# Patient Record
Sex: Female | Born: 1955 | Race: White | Hispanic: No | Smoking: Never smoker
Health system: Southern US, Community
[De-identification: ages and names within clinical notes are randomized; demographics above are authoritative.]

## PROBLEM LIST (undated history)

## (undated) DIAGNOSIS — E119 Type 2 diabetes mellitus without complications: Secondary | ICD-10-CM

## (undated) DIAGNOSIS — C439 Malignant melanoma of skin, unspecified: Secondary | ICD-10-CM

## (undated) DIAGNOSIS — Z803 Family history of malignant neoplasm of breast: Secondary | ICD-10-CM

## (undated) DIAGNOSIS — D6862 Lupus anticoagulant syndrome: Secondary | ICD-10-CM

## (undated) DIAGNOSIS — Z8043 Family history of malignant neoplasm of testis: Secondary | ICD-10-CM

## (undated) DIAGNOSIS — T7840XA Allergy, unspecified, initial encounter: Secondary | ICD-10-CM

## (undated) DIAGNOSIS — I2699 Other pulmonary embolism without acute cor pulmonale: Secondary | ICD-10-CM

## (undated) DIAGNOSIS — J45909 Unspecified asthma, uncomplicated: Secondary | ICD-10-CM

## (undated) DIAGNOSIS — C801 Malignant (primary) neoplasm, unspecified: Secondary | ICD-10-CM

## (undated) DIAGNOSIS — I82409 Acute embolism and thrombosis of unspecified deep veins of unspecified lower extremity: Secondary | ICD-10-CM

## (undated) DIAGNOSIS — E039 Hypothyroidism, unspecified: Secondary | ICD-10-CM

## (undated) DIAGNOSIS — K219 Gastro-esophageal reflux disease without esophagitis: Secondary | ICD-10-CM

## (undated) DIAGNOSIS — D689 Coagulation defect, unspecified: Secondary | ICD-10-CM

## (undated) DIAGNOSIS — Z8041 Family history of malignant neoplasm of ovary: Secondary | ICD-10-CM

## (undated) DIAGNOSIS — I10 Essential (primary) hypertension: Secondary | ICD-10-CM

## (undated) DIAGNOSIS — Z5189 Encounter for other specified aftercare: Secondary | ICD-10-CM

## (undated) DIAGNOSIS — E079 Disorder of thyroid, unspecified: Secondary | ICD-10-CM

## (undated) DIAGNOSIS — I499 Cardiac arrhythmia, unspecified: Secondary | ICD-10-CM

## (undated) DIAGNOSIS — F419 Anxiety disorder, unspecified: Secondary | ICD-10-CM

## (undated) DIAGNOSIS — C7A8 Other malignant neuroendocrine tumors: Secondary | ICD-10-CM

## (undated) DIAGNOSIS — Z801 Family history of malignant neoplasm of trachea, bronchus and lung: Secondary | ICD-10-CM

## (undated) DIAGNOSIS — D509 Iron deficiency anemia, unspecified: Secondary | ICD-10-CM

## (undated) DIAGNOSIS — M1990 Unspecified osteoarthritis, unspecified site: Secondary | ICD-10-CM

## (undated) DIAGNOSIS — R519 Headache, unspecified: Secondary | ICD-10-CM

## (undated) DIAGNOSIS — F32A Depression, unspecified: Secondary | ICD-10-CM

## (undated) DIAGNOSIS — I480 Paroxysmal atrial fibrillation: Secondary | ICD-10-CM

## (undated) DIAGNOSIS — Z808 Family history of malignant neoplasm of other organs or systems: Secondary | ICD-10-CM

## (undated) DIAGNOSIS — H269 Unspecified cataract: Secondary | ICD-10-CM

## (undated) DIAGNOSIS — J189 Pneumonia, unspecified organism: Secondary | ICD-10-CM

## (undated) DIAGNOSIS — Z8049 Family history of malignant neoplasm of other genital organs: Secondary | ICD-10-CM

## (undated) DIAGNOSIS — M722 Plantar fascial fibromatosis: Secondary | ICD-10-CM

## (undated) DIAGNOSIS — F329 Major depressive disorder, single episode, unspecified: Secondary | ICD-10-CM

## (undated) DIAGNOSIS — N189 Chronic kidney disease, unspecified: Secondary | ICD-10-CM

## (undated) DIAGNOSIS — D6859 Other primary thrombophilia: Secondary | ICD-10-CM

## (undated) HISTORY — PX: KNEE ARTHROSCOPY: SHX127

## (undated) HISTORY — DX: Malignant (primary) neoplasm, unspecified: C80.1

## (undated) HISTORY — DX: Coagulation defect, unspecified: D68.9

## (undated) HISTORY — DX: Major depressive disorder, single episode, unspecified: F32.9

## (undated) HISTORY — DX: Malignant melanoma of skin, unspecified: C43.9

## (undated) HISTORY — DX: Lupus anticoagulant syndrome: D68.62

## (undated) HISTORY — PX: ABDOMINAL HYSTERECTOMY: SHX81

## (undated) HISTORY — DX: Family history of malignant neoplasm of ovary: Z80.41

## (undated) HISTORY — DX: Family history of malignant neoplasm of other genital organs: Z80.49

## (undated) HISTORY — DX: Disorder of thyroid, unspecified: E07.9

## (undated) HISTORY — PX: OTHER SURGICAL HISTORY: SHX169

## (undated) HISTORY — DX: Family history of malignant neoplasm of testis: Z80.43

## (undated) HISTORY — DX: Other pulmonary embolism without acute cor pulmonale: I26.99

## (undated) HISTORY — PX: CHOLECYSTECTOMY: SHX55

## (undated) HISTORY — DX: Unspecified asthma, uncomplicated: J45.909

## (undated) HISTORY — PX: COLONOSCOPY: SHX174

## (undated) HISTORY — DX: Other primary thrombophilia: D68.59

## (undated) HISTORY — DX: Cardiac arrhythmia, unspecified: I49.9

## (undated) HISTORY — DX: Essential (primary) hypertension: I10

## (undated) HISTORY — DX: Type 2 diabetes mellitus without complications: E11.9

## (undated) HISTORY — DX: Encounter for other specified aftercare: Z51.89

## (undated) HISTORY — DX: Unspecified cataract: H26.9

## (undated) HISTORY — DX: Family history of malignant neoplasm of breast: Z80.3

## (undated) HISTORY — DX: Depression, unspecified: F32.A

## (undated) HISTORY — DX: Family history of malignant neoplasm of other organs or systems: Z80.8

## (undated) HISTORY — DX: Gastro-esophageal reflux disease without esophagitis: K21.9

## (undated) HISTORY — DX: Allergy, unspecified, initial encounter: T78.40XA

## (undated) HISTORY — PX: UPPER GASTROINTESTINAL ENDOSCOPY: SHX188

## (undated) HISTORY — DX: Family history of malignant neoplasm of trachea, bronchus and lung: Z80.1

## (undated) HISTORY — DX: Paroxysmal atrial fibrillation: I48.0

## (undated) HISTORY — DX: Unspecified osteoarthritis, unspecified site: M19.90

## (undated) HISTORY — PX: HYSTEROTOMY: SHX1776

## (undated) HISTORY — PX: OOPHORECTOMY: SHX86

## (undated) HISTORY — DX: Anxiety disorder, unspecified: F41.9

---
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
## 1981-09-21 HISTORY — PX: BACK SURGERY: SHX140

## 1998-01-06 ENCOUNTER — Other Ambulatory Visit: Admission: RE | Admit: 1998-01-06 | Discharge: 1998-01-06 | Payer: Self-pay | Admitting: Internal Medicine

## 1998-03-12 ENCOUNTER — Ambulatory Visit (HOSPITAL_COMMUNITY): Admission: RE | Admit: 1998-03-12 | Discharge: 1998-03-12 | Payer: Self-pay | Admitting: Internal Medicine

## 1998-07-25 ENCOUNTER — Other Ambulatory Visit: Admission: RE | Admit: 1998-07-25 | Discharge: 1998-07-25 | Payer: Self-pay | Admitting: Obstetrics and Gynecology

## 1998-09-21 ENCOUNTER — Emergency Department (HOSPITAL_COMMUNITY): Admission: EM | Admit: 1998-09-21 | Discharge: 1998-09-21 | Payer: Self-pay | Admitting: Emergency Medicine

## 1998-11-07 ENCOUNTER — Encounter: Admission: RE | Admit: 1998-11-07 | Discharge: 1999-02-05 | Payer: Self-pay | Admitting: Anesthesiology

## 1999-06-02 ENCOUNTER — Ambulatory Visit (HOSPITAL_COMMUNITY): Admission: RE | Admit: 1999-06-02 | Discharge: 1999-06-02 | Payer: Self-pay | Admitting: Internal Medicine

## 1999-09-19 ENCOUNTER — Ambulatory Visit (HOSPITAL_COMMUNITY): Admission: RE | Admit: 1999-09-19 | Discharge: 1999-09-19 | Payer: Self-pay | Admitting: Otolaryngology

## 1999-09-19 ENCOUNTER — Encounter: Payer: Self-pay | Admitting: Otolaryngology

## 1999-11-12 ENCOUNTER — Encounter: Payer: Self-pay | Admitting: Internal Medicine

## 1999-11-12 ENCOUNTER — Encounter: Admission: RE | Admit: 1999-11-12 | Discharge: 1999-11-12 | Payer: Self-pay | Admitting: Internal Medicine

## 2000-03-27 ENCOUNTER — Encounter: Payer: Self-pay | Admitting: Emergency Medicine

## 2000-03-27 ENCOUNTER — Emergency Department (HOSPITAL_COMMUNITY): Admission: EM | Admit: 2000-03-27 | Discharge: 2000-03-27 | Payer: Self-pay | Admitting: Emergency Medicine

## 2000-08-29 ENCOUNTER — Emergency Department (HOSPITAL_COMMUNITY): Admission: EM | Admit: 2000-08-29 | Discharge: 2000-08-29 | Payer: Self-pay | Admitting: Emergency Medicine

## 2000-08-29 ENCOUNTER — Encounter: Payer: Self-pay | Admitting: Emergency Medicine

## 2000-10-27 ENCOUNTER — Other Ambulatory Visit: Admission: RE | Admit: 2000-10-27 | Discharge: 2000-10-27 | Payer: Self-pay | Admitting: Obstetrics and Gynecology

## 2001-07-28 ENCOUNTER — Inpatient Hospital Stay (HOSPITAL_COMMUNITY): Admission: EM | Admit: 2001-07-28 | Discharge: 2001-07-31 | Payer: Self-pay

## 2001-07-29 ENCOUNTER — Encounter: Payer: Self-pay | Admitting: Internal Medicine

## 2001-12-25 ENCOUNTER — Emergency Department (HOSPITAL_COMMUNITY): Admission: EM | Admit: 2001-12-25 | Discharge: 2001-12-25 | Payer: Self-pay | Admitting: Emergency Medicine

## 2003-09-22 HISTORY — PX: MELANOMA EXCISION: SHX5266

## 2004-01-14 ENCOUNTER — Other Ambulatory Visit: Admission: RE | Admit: 2004-01-14 | Discharge: 2004-01-14 | Payer: Self-pay | Admitting: Obstetrics and Gynecology

## 2004-01-14 LAB — HM PAP SMEAR

## 2004-07-28 ENCOUNTER — Ambulatory Visit: Payer: Self-pay | Admitting: Internal Medicine

## 2004-08-04 ENCOUNTER — Ambulatory Visit: Payer: Self-pay | Admitting: Licensed Clinical Social Worker

## 2004-08-05 ENCOUNTER — Ambulatory Visit: Payer: Self-pay | Admitting: Gastroenterology

## 2004-08-15 ENCOUNTER — Ambulatory Visit: Payer: Self-pay | Admitting: Internal Medicine

## 2004-08-21 HISTORY — PX: OTHER SURGICAL HISTORY: SHX169

## 2004-08-26 ENCOUNTER — Ambulatory Visit: Payer: Self-pay | Admitting: Internal Medicine

## 2004-09-08 ENCOUNTER — Ambulatory Visit: Payer: Self-pay | Admitting: Internal Medicine

## 2004-09-18 ENCOUNTER — Ambulatory Visit: Payer: Self-pay | Admitting: Internal Medicine

## 2004-09-25 ENCOUNTER — Ambulatory Visit: Payer: Self-pay | Admitting: Internal Medicine

## 2004-10-06 ENCOUNTER — Ambulatory Visit: Payer: Self-pay | Admitting: Internal Medicine

## 2004-10-09 ENCOUNTER — Ambulatory Visit: Payer: Self-pay | Admitting: Gastroenterology

## 2004-10-16 ENCOUNTER — Ambulatory Visit: Payer: Self-pay | Admitting: Internal Medicine

## 2004-10-17 ENCOUNTER — Encounter: Payer: Self-pay | Admitting: Internal Medicine

## 2004-10-17 ENCOUNTER — Ambulatory Visit: Payer: Self-pay | Admitting: Gastroenterology

## 2004-10-27 ENCOUNTER — Ambulatory Visit: Payer: Self-pay | Admitting: Internal Medicine

## 2004-11-07 ENCOUNTER — Ambulatory Visit: Payer: Self-pay | Admitting: Internal Medicine

## 2004-12-05 ENCOUNTER — Ambulatory Visit: Payer: Self-pay | Admitting: Internal Medicine

## 2004-12-19 ENCOUNTER — Ambulatory Visit: Payer: Self-pay | Admitting: Internal Medicine

## 2005-01-07 ENCOUNTER — Ambulatory Visit: Payer: Self-pay | Admitting: Internal Medicine

## 2005-02-04 ENCOUNTER — Ambulatory Visit: Payer: Self-pay | Admitting: Internal Medicine

## 2005-03-06 ENCOUNTER — Ambulatory Visit: Payer: Self-pay | Admitting: Internal Medicine

## 2005-03-18 ENCOUNTER — Ambulatory Visit: Payer: Self-pay | Admitting: Internal Medicine

## 2005-04-03 ENCOUNTER — Ambulatory Visit: Payer: Self-pay | Admitting: Internal Medicine

## 2005-04-07 ENCOUNTER — Ambulatory Visit: Payer: Self-pay | Admitting: Internal Medicine

## 2005-05-04 ENCOUNTER — Ambulatory Visit: Payer: Self-pay | Admitting: Internal Medicine

## 2005-05-22 ENCOUNTER — Ambulatory Visit: Payer: Self-pay | Admitting: Gastroenterology

## 2005-06-01 ENCOUNTER — Encounter (INDEPENDENT_AMBULATORY_CARE_PROVIDER_SITE_OTHER): Payer: Self-pay | Admitting: *Deleted

## 2005-06-01 ENCOUNTER — Ambulatory Visit: Payer: Self-pay | Admitting: Gastroenterology

## 2005-06-04 ENCOUNTER — Ambulatory Visit: Payer: Self-pay | Admitting: Internal Medicine

## 2005-06-18 ENCOUNTER — Ambulatory Visit: Payer: Self-pay | Admitting: Gastroenterology

## 2005-07-03 ENCOUNTER — Ambulatory Visit: Payer: Self-pay | Admitting: Internal Medicine

## 2005-07-07 ENCOUNTER — Ambulatory Visit: Payer: Self-pay | Admitting: Gastroenterology

## 2005-07-08 ENCOUNTER — Ambulatory Visit: Payer: Self-pay | Admitting: Internal Medicine

## 2005-07-24 ENCOUNTER — Ambulatory Visit (HOSPITAL_COMMUNITY): Admission: RE | Admit: 2005-07-24 | Discharge: 2005-07-24 | Payer: Self-pay | Admitting: Gastroenterology

## 2005-07-24 ENCOUNTER — Ambulatory Visit: Payer: Self-pay | Admitting: Internal Medicine

## 2005-08-03 ENCOUNTER — Ambulatory Visit: Payer: Self-pay | Admitting: Internal Medicine

## 2005-09-04 ENCOUNTER — Ambulatory Visit: Payer: Self-pay | Admitting: Internal Medicine

## 2005-09-09 ENCOUNTER — Ambulatory Visit: Payer: Self-pay | Admitting: Internal Medicine

## 2005-10-02 ENCOUNTER — Ambulatory Visit: Payer: Self-pay | Admitting: Internal Medicine

## 2005-10-09 ENCOUNTER — Ambulatory Visit: Payer: Self-pay | Admitting: Internal Medicine

## 2005-10-30 ENCOUNTER — Ambulatory Visit: Payer: Self-pay | Admitting: Internal Medicine

## 2005-11-10 ENCOUNTER — Ambulatory Visit: Payer: Self-pay | Admitting: Internal Medicine

## 2005-11-26 ENCOUNTER — Ambulatory Visit: Payer: Self-pay | Admitting: Internal Medicine

## 2005-12-15 ENCOUNTER — Ambulatory Visit: Payer: Self-pay | Admitting: Internal Medicine

## 2005-12-24 ENCOUNTER — Ambulatory Visit: Payer: Self-pay | Admitting: Internal Medicine

## 2006-01-19 ENCOUNTER — Ambulatory Visit: Payer: Self-pay | Admitting: Internal Medicine

## 2006-03-01 ENCOUNTER — Ambulatory Visit: Payer: Self-pay | Admitting: Internal Medicine

## 2006-04-01 ENCOUNTER — Ambulatory Visit: Payer: Self-pay | Admitting: Internal Medicine

## 2006-04-21 ENCOUNTER — Ambulatory Visit: Payer: Self-pay | Admitting: Internal Medicine

## 2006-05-04 ENCOUNTER — Ambulatory Visit: Payer: Self-pay | Admitting: Internal Medicine

## 2006-06-03 ENCOUNTER — Ambulatory Visit: Payer: Self-pay | Admitting: Internal Medicine

## 2006-06-09 ENCOUNTER — Ambulatory Visit: Payer: Self-pay | Admitting: Internal Medicine

## 2006-06-24 ENCOUNTER — Ambulatory Visit: Payer: Self-pay | Admitting: Internal Medicine

## 2006-06-24 ENCOUNTER — Encounter: Admission: RE | Admit: 2006-06-24 | Discharge: 2006-06-24 | Payer: Self-pay | Admitting: Internal Medicine

## 2006-07-22 ENCOUNTER — Ambulatory Visit: Payer: Self-pay | Admitting: Internal Medicine

## 2006-07-22 LAB — CONVERTED CEMR LAB
ALT: 34 units/L (ref 0–40)
AST: 30 units/L (ref 0–37)
Albumin: 4 g/dL (ref 3.5–5.2)
Bilirubin, Direct: 0.2 mg/dL (ref 0.0–0.3)
LDL Cholesterol: 81 mg/dL (ref 0–99)
TSH: 2.92 microintl units/mL (ref 0.35–5.50)
VLDL: 15 mg/dL (ref 0–40)

## 2006-07-29 ENCOUNTER — Ambulatory Visit: Payer: Self-pay | Admitting: Internal Medicine

## 2006-08-19 ENCOUNTER — Ambulatory Visit: Payer: Self-pay | Admitting: Internal Medicine

## 2006-09-17 ENCOUNTER — Ambulatory Visit: Payer: Self-pay | Admitting: Internal Medicine

## 2006-10-21 ENCOUNTER — Ambulatory Visit: Payer: Self-pay | Admitting: Internal Medicine

## 2006-10-27 ENCOUNTER — Ambulatory Visit: Payer: Self-pay | Admitting: Internal Medicine

## 2006-10-27 LAB — CONVERTED CEMR LAB
BUN: 10 mg/dL (ref 6–23)
Basophils Relative: 0.8 % (ref 0.0–1.0)
CO2: 34 meq/L — ABNORMAL HIGH (ref 19–32)
Creatinine, Ser: 0.8 mg/dL (ref 0.4–1.2)
GFR calc Af Amer: 97 mL/min
Glucose, Bld: 97 mg/dL (ref 70–99)
HCT: 41.5 % (ref 36.0–46.0)
Hemoglobin: 14.5 g/dL (ref 12.0–15.0)
Lymphocytes Relative: 35.4 % (ref 12.0–46.0)
Monocytes Absolute: 0.5 10*3/uL (ref 0.2–0.7)
Monocytes Relative: 8 % (ref 3.0–11.0)
Neutro Abs: 2.9 10*3/uL (ref 1.4–7.7)
Neutrophils Relative %: 49.3 % (ref 43.0–77.0)
Potassium: 3.9 meq/L (ref 3.5–5.1)
RDW: 11.9 % (ref 11.5–14.6)
Sodium: 141 meq/L (ref 135–145)

## 2006-11-04 ENCOUNTER — Ambulatory Visit: Payer: Self-pay | Admitting: Internal Medicine

## 2006-11-17 ENCOUNTER — Ambulatory Visit: Payer: Self-pay | Admitting: Internal Medicine

## 2006-11-25 ENCOUNTER — Ambulatory Visit: Payer: Self-pay | Admitting: Internal Medicine

## 2006-12-27 ENCOUNTER — Ambulatory Visit: Payer: Self-pay | Admitting: Internal Medicine

## 2007-01-27 ENCOUNTER — Ambulatory Visit: Payer: Self-pay | Admitting: Internal Medicine

## 2007-02-17 ENCOUNTER — Ambulatory Visit: Payer: Self-pay | Admitting: Internal Medicine

## 2007-02-22 ENCOUNTER — Ambulatory Visit: Payer: Self-pay | Admitting: Internal Medicine

## 2007-03-09 DIAGNOSIS — Z85828 Personal history of other malignant neoplasm of skin: Secondary | ICD-10-CM

## 2007-03-09 DIAGNOSIS — I10 Essential (primary) hypertension: Secondary | ICD-10-CM

## 2007-03-09 DIAGNOSIS — E039 Hypothyroidism, unspecified: Secondary | ICD-10-CM | POA: Insufficient documentation

## 2007-03-09 DIAGNOSIS — K219 Gastro-esophageal reflux disease without esophagitis: Secondary | ICD-10-CM | POA: Insufficient documentation

## 2007-03-09 DIAGNOSIS — J309 Allergic rhinitis, unspecified: Secondary | ICD-10-CM | POA: Insufficient documentation

## 2007-03-18 ENCOUNTER — Ambulatory Visit: Payer: Self-pay | Admitting: Internal Medicine

## 2007-04-18 ENCOUNTER — Ambulatory Visit: Payer: Self-pay | Admitting: Internal Medicine

## 2007-04-18 DIAGNOSIS — I82409 Acute embolism and thrombosis of unspecified deep veins of unspecified lower extremity: Secondary | ICD-10-CM | POA: Insufficient documentation

## 2007-05-19 ENCOUNTER — Ambulatory Visit: Payer: Self-pay | Admitting: Internal Medicine

## 2007-05-19 DIAGNOSIS — I824Y9 Acute embolism and thrombosis of unspecified deep veins of unspecified proximal lower extremity: Secondary | ICD-10-CM

## 2007-05-19 LAB — CONVERTED CEMR LAB
INR: 1.7
Prothrombin Time: 15.8 s

## 2007-05-20 ENCOUNTER — Encounter: Payer: Self-pay | Admitting: Internal Medicine

## 2007-05-25 ENCOUNTER — Ambulatory Visit: Payer: Self-pay | Admitting: Internal Medicine

## 2007-05-25 DIAGNOSIS — M810 Age-related osteoporosis without current pathological fracture: Secondary | ICD-10-CM | POA: Insufficient documentation

## 2007-05-25 LAB — CONVERTED CEMR LAB: TSH: 3.63 microintl units/mL (ref 0.35–5.50)

## 2007-06-13 ENCOUNTER — Encounter: Payer: Self-pay | Admitting: Internal Medicine

## 2007-06-16 ENCOUNTER — Ambulatory Visit: Payer: Self-pay | Admitting: Internal Medicine

## 2007-06-16 LAB — CONVERTED CEMR LAB
INR: 1.9
Prothrombin Time: 17 s

## 2007-07-14 ENCOUNTER — Ambulatory Visit: Payer: Self-pay | Admitting: Internal Medicine

## 2007-07-14 LAB — CONVERTED CEMR LAB: INR: 2

## 2007-07-26 ENCOUNTER — Ambulatory Visit: Payer: Self-pay | Admitting: Internal Medicine

## 2007-08-11 ENCOUNTER — Ambulatory Visit: Payer: Self-pay | Admitting: Internal Medicine

## 2007-08-11 LAB — CONVERTED CEMR LAB: Prothrombin Time: 20.7 s

## 2007-08-24 DIAGNOSIS — T1490XA Injury, unspecified, initial encounter: Secondary | ICD-10-CM | POA: Insufficient documentation

## 2007-08-26 ENCOUNTER — Ambulatory Visit: Payer: Self-pay | Admitting: Family Medicine

## 2007-08-30 ENCOUNTER — Telehealth: Payer: Self-pay | Admitting: Internal Medicine

## 2007-09-02 ENCOUNTER — Ambulatory Visit: Payer: Self-pay | Admitting: Internal Medicine

## 2007-09-19 ENCOUNTER — Ambulatory Visit: Payer: Self-pay | Admitting: Internal Medicine

## 2007-09-19 DIAGNOSIS — H66009 Acute suppurative otitis media without spontaneous rupture of ear drum, unspecified ear: Secondary | ICD-10-CM | POA: Insufficient documentation

## 2007-09-21 ENCOUNTER — Telehealth: Payer: Self-pay | Admitting: Family Medicine

## 2007-09-21 ENCOUNTER — Ambulatory Visit: Payer: Self-pay | Admitting: Family Medicine

## 2007-09-28 ENCOUNTER — Encounter: Payer: Self-pay | Admitting: Internal Medicine

## 2007-09-29 ENCOUNTER — Ambulatory Visit: Payer: Self-pay | Admitting: Internal Medicine

## 2007-10-06 ENCOUNTER — Telehealth: Payer: Self-pay | Admitting: Internal Medicine

## 2007-10-07 ENCOUNTER — Ambulatory Visit: Payer: Self-pay | Admitting: Internal Medicine

## 2007-11-01 ENCOUNTER — Ambulatory Visit: Payer: Self-pay | Admitting: Internal Medicine

## 2007-11-01 LAB — CONVERTED CEMR LAB
INR: 2.8
Prothrombin Time: 20.2 s

## 2007-11-08 ENCOUNTER — Telehealth: Payer: Self-pay | Admitting: Internal Medicine

## 2007-11-30 ENCOUNTER — Ambulatory Visit: Payer: Self-pay | Admitting: Internal Medicine

## 2007-11-30 DIAGNOSIS — D6859 Other primary thrombophilia: Secondary | ICD-10-CM

## 2007-12-06 ENCOUNTER — Ambulatory Visit: Payer: Self-pay | Admitting: Internal Medicine

## 2007-12-06 LAB — CONVERTED CEMR LAB
INR: 2.4
Prothrombin Time: 19 s

## 2008-01-11 ENCOUNTER — Ambulatory Visit: Payer: Self-pay | Admitting: Internal Medicine

## 2008-01-11 LAB — CONVERTED CEMR LAB
INR: 3
Prothrombin Time: 21 s

## 2008-01-27 ENCOUNTER — Encounter: Payer: Self-pay | Admitting: Internal Medicine

## 2008-02-09 ENCOUNTER — Ambulatory Visit: Payer: Self-pay | Admitting: Internal Medicine

## 2008-03-09 ENCOUNTER — Ambulatory Visit: Payer: Self-pay | Admitting: Internal Medicine

## 2008-03-09 LAB — CONVERTED CEMR LAB
Basophils Absolute: 0 10*3/uL (ref 0.0–0.1)
Bilirubin, Direct: 0.1 mg/dL (ref 0.0–0.3)
Blood in Urine, dipstick: NEGATIVE
Calcium: 8.8 mg/dL (ref 8.4–10.5)
Cholesterol: 164 mg/dL (ref 0–200)
GFR calc Af Amer: 84 mL/min
GFR calc non Af Amer: 69 mL/min
Glucose, Urine, Semiquant: NEGATIVE
HCT: 39 % (ref 36.0–46.0)
HDL: 53.5 mg/dL (ref >39.0)
Hemoglobin: 13.3 g/dL (ref 12.0–15.0)
LDL Cholesterol: 86 mg/dL (ref 0–99)
Lymphocytes Relative: 26.1 % (ref 12.0–46.0)
MCHC: 34 g/dL (ref 30.0–36.0)
Monocytes Absolute: 0.6 10*3/uL (ref 0.1–1.0)
Neutro Abs: 4.3 10*3/uL (ref 1.4–7.7)
Platelets: 215 10*3/uL (ref 150–400)
Protein, U semiquant: NEGATIVE
RDW: 12.8 % (ref 11.5–14.6)
Sodium: 139 meq/L (ref 135–145)
Total Bilirubin: 1 mg/dL (ref 0.3–1.2)
Triglycerides: 121 mg/dL (ref 0–149)
Urobilinogen, UA: 0.2
WBC Urine, dipstick: NEGATIVE
pH: 7.5

## 2008-03-16 ENCOUNTER — Ambulatory Visit: Payer: Self-pay | Admitting: Internal Medicine

## 2008-04-05 ENCOUNTER — Ambulatory Visit: Payer: Self-pay | Admitting: Internal Medicine

## 2008-04-05 LAB — CONVERTED CEMR LAB
INR: 2.1
Prothrombin Time: 17.9 s

## 2008-04-23 ENCOUNTER — Telehealth: Payer: Self-pay | Admitting: Internal Medicine

## 2008-05-04 ENCOUNTER — Ambulatory Visit: Payer: Self-pay | Admitting: Internal Medicine

## 2008-05-04 LAB — CONVERTED CEMR LAB
INR: 2.5
Prothrombin Time: 19.4 s

## 2008-06-04 ENCOUNTER — Telehealth: Payer: Self-pay | Admitting: Internal Medicine

## 2008-06-04 ENCOUNTER — Ambulatory Visit: Payer: Self-pay | Admitting: Internal Medicine

## 2008-06-04 LAB — CONVERTED CEMR LAB
Glucose, Urine, Semiquant: NEGATIVE
Protein, U semiquant: NEGATIVE
Specific Gravity, Urine: 1.025
WBC Urine, dipstick: NEGATIVE
pH: 6

## 2008-06-15 ENCOUNTER — Ambulatory Visit: Payer: Self-pay | Admitting: Internal Medicine

## 2008-06-15 DIAGNOSIS — M545 Low back pain: Secondary | ICD-10-CM

## 2008-07-02 ENCOUNTER — Ambulatory Visit: Payer: Self-pay | Admitting: Internal Medicine

## 2008-07-02 LAB — CONVERTED CEMR LAB: INR: 4

## 2008-07-16 ENCOUNTER — Ambulatory Visit: Payer: Self-pay | Admitting: Internal Medicine

## 2008-08-13 ENCOUNTER — Ambulatory Visit: Payer: Self-pay | Admitting: Internal Medicine

## 2008-08-13 LAB — CONVERTED CEMR LAB
INR: 3.4
Prothrombin Time: 22.2 s

## 2008-08-29 ENCOUNTER — Telehealth (INDEPENDENT_AMBULATORY_CARE_PROVIDER_SITE_OTHER): Payer: Self-pay | Admitting: *Deleted

## 2008-08-29 ENCOUNTER — Ambulatory Visit: Payer: Self-pay | Admitting: Internal Medicine

## 2008-08-29 DIAGNOSIS — S99919A Unspecified injury of unspecified ankle, initial encounter: Secondary | ICD-10-CM

## 2008-08-29 DIAGNOSIS — S8990XA Unspecified injury of unspecified lower leg, initial encounter: Secondary | ICD-10-CM

## 2008-08-29 DIAGNOSIS — S99929A Unspecified injury of unspecified foot, initial encounter: Secondary | ICD-10-CM

## 2008-08-31 ENCOUNTER — Encounter: Payer: Self-pay | Admitting: Gastroenterology

## 2008-08-31 ENCOUNTER — Encounter: Payer: Self-pay | Admitting: Internal Medicine

## 2008-09-10 ENCOUNTER — Ambulatory Visit: Payer: Self-pay | Admitting: Internal Medicine

## 2008-10-04 ENCOUNTER — Telehealth: Payer: Self-pay | Admitting: Internal Medicine

## 2008-10-12 ENCOUNTER — Ambulatory Visit: Payer: Self-pay | Admitting: Internal Medicine

## 2008-10-12 LAB — CONVERTED CEMR LAB
Calcium: 9.8 mg/dL (ref 8.4–10.5)
GFR calc Af Amer: 84 mL/min
Glucose, Bld: 124 mg/dL — ABNORMAL HIGH (ref 70–99)
Sodium: 145 meq/L (ref 135–145)

## 2008-11-05 ENCOUNTER — Telehealth: Payer: Self-pay | Admitting: Internal Medicine

## 2008-11-05 ENCOUNTER — Encounter: Payer: Self-pay | Admitting: Internal Medicine

## 2008-11-05 ENCOUNTER — Ambulatory Visit: Payer: Self-pay

## 2008-11-09 ENCOUNTER — Ambulatory Visit: Payer: Self-pay | Admitting: Internal Medicine

## 2008-11-09 LAB — CONVERTED CEMR LAB: Prothrombin Time: 20.7 s

## 2008-12-07 ENCOUNTER — Ambulatory Visit: Payer: Self-pay | Admitting: Internal Medicine

## 2008-12-31 ENCOUNTER — Telehealth: Payer: Self-pay | Admitting: Internal Medicine

## 2009-01-01 ENCOUNTER — Ambulatory Visit: Payer: Self-pay | Admitting: Internal Medicine

## 2009-01-01 DIAGNOSIS — K141 Geographic tongue: Secondary | ICD-10-CM

## 2009-01-01 DIAGNOSIS — K140 Glossitis: Secondary | ICD-10-CM | POA: Insufficient documentation

## 2009-01-11 ENCOUNTER — Ambulatory Visit: Payer: Self-pay | Admitting: Internal Medicine

## 2009-02-11 ENCOUNTER — Ambulatory Visit: Payer: Self-pay | Admitting: Internal Medicine

## 2009-03-01 ENCOUNTER — Encounter: Payer: Self-pay | Admitting: Gastroenterology

## 2009-03-01 ENCOUNTER — Encounter: Payer: Self-pay | Admitting: Internal Medicine

## 2009-03-11 ENCOUNTER — Ambulatory Visit: Payer: Self-pay | Admitting: Internal Medicine

## 2009-03-11 LAB — CONVERTED CEMR LAB
INR: 2
Prothrombin Time: 17.6 s

## 2009-04-09 ENCOUNTER — Ambulatory Visit: Payer: Self-pay | Admitting: Internal Medicine

## 2009-04-09 LAB — CONVERTED CEMR LAB
AST: 37 units/L (ref 0–37)
Albumin: 4 g/dL (ref 3.5–5.2)
Basophils Absolute: 0 10*3/uL (ref 0.0–0.1)
Basophils Relative: 0.8 % (ref 0.0–3.0)
Blood in Urine, dipstick: NEGATIVE
CO2: 31 meq/L (ref 19–32)
Eosinophils Absolute: 0.3 10*3/uL (ref 0.0–0.7)
Glucose, Bld: 137 mg/dL — ABNORMAL HIGH (ref 70–99)
Glucose, Urine, Semiquant: NEGATIVE
HCT: 38.5 % (ref 36.0–46.0)
Hemoglobin: 13.4 g/dL (ref 12.0–15.0)
INR: 2.4
Lymphs Abs: 1.9 10*3/uL (ref 0.7–4.0)
MCHC: 34.7 g/dL (ref 30.0–36.0)
Monocytes Relative: 9 % (ref 3.0–12.0)
Neutro Abs: 2.9 10*3/uL (ref 1.4–7.7)
Potassium: 4.1 meq/L (ref 3.5–5.1)
RDW: 13.1 % (ref 11.5–14.6)
Sodium: 144 meq/L (ref 135–145)
Specific Gravity, Urine: 1.02
TSH: 4.95 microintl units/mL (ref 0.35–5.50)
Total Protein: 7.3 g/dL (ref 6.0–8.3)
pH: 8.5

## 2009-04-16 ENCOUNTER — Ambulatory Visit: Payer: Self-pay | Admitting: Internal Medicine

## 2009-04-16 LAB — HM COLONOSCOPY

## 2009-05-07 ENCOUNTER — Ambulatory Visit: Payer: Self-pay | Admitting: Internal Medicine

## 2009-06-04 ENCOUNTER — Ambulatory Visit: Payer: Self-pay | Admitting: Internal Medicine

## 2009-06-04 LAB — CONVERTED CEMR LAB: Prothrombin Time: 17.9 s

## 2009-06-17 ENCOUNTER — Ambulatory Visit: Payer: Self-pay | Admitting: Internal Medicine

## 2009-07-09 ENCOUNTER — Ambulatory Visit: Payer: Self-pay | Admitting: Internal Medicine

## 2009-07-15 ENCOUNTER — Telehealth: Payer: Self-pay | Admitting: Internal Medicine

## 2009-07-19 ENCOUNTER — Telehealth: Payer: Self-pay | Admitting: Internal Medicine

## 2009-07-22 ENCOUNTER — Telehealth: Payer: Self-pay | Admitting: Internal Medicine

## 2009-08-06 ENCOUNTER — Ambulatory Visit: Payer: Self-pay | Admitting: Internal Medicine

## 2009-08-06 LAB — CONVERTED CEMR LAB
Hgb A1c MFr Bld: 6.5 % (ref 4.6–6.5)
INR: 2.5

## 2009-08-14 ENCOUNTER — Ambulatory Visit: Payer: Self-pay | Admitting: Internal Medicine

## 2009-09-06 ENCOUNTER — Ambulatory Visit: Payer: Self-pay | Admitting: Internal Medicine

## 2009-09-06 LAB — CONVERTED CEMR LAB
INR: 3.3
Prothrombin Time: 21.8 s

## 2009-09-18 ENCOUNTER — Ambulatory Visit: Payer: Self-pay | Admitting: Internal Medicine

## 2009-10-04 ENCOUNTER — Ambulatory Visit: Payer: Self-pay | Admitting: Internal Medicine

## 2009-10-04 LAB — CONVERTED CEMR LAB: INR: 2.2

## 2009-10-09 ENCOUNTER — Encounter: Payer: Self-pay | Admitting: Internal Medicine

## 2009-10-22 ENCOUNTER — Telehealth: Payer: Self-pay | Admitting: Internal Medicine

## 2009-11-01 ENCOUNTER — Telehealth: Payer: Self-pay | Admitting: Internal Medicine

## 2009-11-05 ENCOUNTER — Ambulatory Visit: Payer: Self-pay | Admitting: Internal Medicine

## 2009-11-09 ENCOUNTER — Emergency Department (HOSPITAL_COMMUNITY): Admission: EM | Admit: 2009-11-09 | Discharge: 2009-11-10 | Payer: Self-pay | Admitting: Emergency Medicine

## 2009-11-11 ENCOUNTER — Encounter: Payer: Self-pay | Admitting: Internal Medicine

## 2009-11-29 ENCOUNTER — Ambulatory Visit: Payer: Self-pay | Admitting: Internal Medicine

## 2009-11-29 DIAGNOSIS — E663 Overweight: Secondary | ICD-10-CM | POA: Insufficient documentation

## 2009-11-29 DIAGNOSIS — L219 Seborrheic dermatitis, unspecified: Secondary | ICD-10-CM

## 2009-12-23 ENCOUNTER — Encounter: Payer: Self-pay | Admitting: Internal Medicine

## 2009-12-27 ENCOUNTER — Ambulatory Visit: Payer: Self-pay | Admitting: Internal Medicine

## 2009-12-27 LAB — CONVERTED CEMR LAB
INR: 1.8
Prothrombin Time: 16.5 s

## 2010-01-24 ENCOUNTER — Ambulatory Visit: Payer: Self-pay | Admitting: Internal Medicine

## 2010-01-24 LAB — CONVERTED CEMR LAB
INR: 2.9
Prothrombin Time: 20.7 s

## 2010-01-27 ENCOUNTER — Telehealth: Payer: Self-pay | Admitting: Internal Medicine

## 2010-02-21 ENCOUNTER — Ambulatory Visit: Payer: Self-pay | Admitting: Internal Medicine

## 2010-02-21 LAB — CONVERTED CEMR LAB: INR: 2.5

## 2010-03-20 ENCOUNTER — Ambulatory Visit: Payer: Self-pay | Admitting: Internal Medicine

## 2010-03-20 LAB — CONVERTED CEMR LAB: INR: 2.8

## 2010-04-02 ENCOUNTER — Ambulatory Visit: Payer: Self-pay | Admitting: Internal Medicine

## 2010-04-02 DIAGNOSIS — E785 Hyperlipidemia, unspecified: Secondary | ICD-10-CM

## 2010-04-02 DIAGNOSIS — F5102 Adjustment insomnia: Secondary | ICD-10-CM | POA: Insufficient documentation

## 2010-04-02 LAB — CONVERTED CEMR LAB
BUN: 11 mg/dL (ref 6–23)
CO2: 30 meq/L (ref 19–32)
Calcium: 9.1 mg/dL (ref 8.4–10.5)
Chloride: 105 meq/L (ref 96–112)
Cholesterol: 155 mg/dL (ref 0–200)
Creatinine, Ser: 0.8 mg/dL (ref 0.4–1.2)
Glucose, Bld: 129 mg/dL — ABNORMAL HIGH (ref 70–99)

## 2010-04-22 ENCOUNTER — Ambulatory Visit: Payer: Self-pay | Admitting: Internal Medicine

## 2010-04-22 LAB — CONVERTED CEMR LAB: Vit D, 25-Hydroxy: 43 ng/mL (ref 30–89)

## 2010-05-20 ENCOUNTER — Ambulatory Visit: Payer: Self-pay | Admitting: Internal Medicine

## 2010-05-20 LAB — CONVERTED CEMR LAB: INR: 2.8

## 2010-06-17 ENCOUNTER — Ambulatory Visit: Payer: Self-pay | Admitting: Internal Medicine

## 2010-06-17 LAB — CONVERTED CEMR LAB: INR: 3.1

## 2010-07-02 ENCOUNTER — Ambulatory Visit: Payer: Self-pay | Admitting: Internal Medicine

## 2010-07-02 DIAGNOSIS — I4891 Unspecified atrial fibrillation: Secondary | ICD-10-CM

## 2010-07-15 ENCOUNTER — Ambulatory Visit: Payer: Self-pay | Admitting: Internal Medicine

## 2010-08-11 ENCOUNTER — Ambulatory Visit: Payer: Self-pay | Admitting: Internal Medicine

## 2010-08-11 LAB — CONVERTED CEMR LAB: INR: 3

## 2010-08-17 ENCOUNTER — Emergency Department (HOSPITAL_BASED_OUTPATIENT_CLINIC_OR_DEPARTMENT_OTHER)
Admission: EM | Admit: 2010-08-17 | Discharge: 2010-08-17 | Payer: Self-pay | Source: Home / Self Care | Admitting: Emergency Medicine

## 2010-09-02 ENCOUNTER — Ambulatory Visit: Payer: Self-pay

## 2010-09-08 ENCOUNTER — Ambulatory Visit: Payer: Self-pay | Admitting: Internal Medicine

## 2010-09-08 LAB — CONVERTED CEMR LAB: INR: 2.2

## 2010-09-29 ENCOUNTER — Other Ambulatory Visit: Payer: Self-pay | Admitting: Internal Medicine

## 2010-09-29 ENCOUNTER — Ambulatory Visit
Admission: RE | Admit: 2010-09-29 | Discharge: 2010-09-29 | Payer: Self-pay | Source: Home / Self Care | Attending: Internal Medicine | Admitting: Internal Medicine

## 2010-09-29 LAB — TSH: TSH: 3.11 u[IU]/mL (ref 0.35–5.50)

## 2010-09-29 LAB — T4, FREE: Free T4: 0.78 ng/dL (ref 0.60–1.60)

## 2010-09-29 LAB — T3, FREE: T3, Free: 3.2 pg/mL (ref 2.3–4.2)

## 2010-10-02 ENCOUNTER — Encounter: Payer: Self-pay | Admitting: Internal Medicine

## 2010-10-02 ENCOUNTER — Ambulatory Visit (HOSPITAL_COMMUNITY)
Admission: RE | Admit: 2010-10-02 | Discharge: 2010-10-02 | Payer: Self-pay | Source: Home / Self Care | Attending: Internal Medicine | Admitting: Internal Medicine

## 2010-10-02 ENCOUNTER — Ambulatory Visit: Admission: RE | Admit: 2010-10-02 | Discharge: 2010-10-02 | Payer: Self-pay | Source: Home / Self Care

## 2010-10-15 ENCOUNTER — Encounter: Payer: Self-pay | Admitting: Internal Medicine

## 2010-10-15 ENCOUNTER — Encounter: Payer: Self-pay | Admitting: Gastroenterology

## 2010-10-17 ENCOUNTER — Telehealth: Payer: Self-pay | Admitting: Internal Medicine

## 2010-10-23 NOTE — Assessment & Plan Note (Signed)
Summary: PT   Nurse Visit   Allergies: 1)  ! * Contrast Dye 2)  Augmentin (Amoxicillin-Pot Clavulanate) Laboratory Results   Blood Tests      INR: 2.8   (Normal Range: 0.88-1.12   Therap INR: 2.0-3.5) Comments: [name]  July 15, 2010 2:09 PM     Orders Added: 1)  Est. Patient Level I [99211] 2)  Protime [[id number]]   ANTICOAGULATION RECORD PREVIOUS REGIMEN & LAB RESULTS Anticoagulation Diagnosis:  v58.83,v58.61,453.41 on  05/19/2007 Previous INR Goal Range:  2.5-3.5 on  05/19/2007 Previous INR:  3.1 on  06/17/2010 Previous Coumadin Dose(mg):  2.5mg ,5mg ,5mg  altinate on  09/10/2008 Previous Regimen:  same on  06/17/2010 Previous Coagulation Comments:  Missed 3 doses  on  12/27/2009  NEW REGIMEN & LAB RESULTS Current INR: 2.8 Regimen: same  Repeat testing in: 4 weeks  Anticoagulation Visit Questionnaire Coumadin dose missed/changed:  No Abnormal Bleeding Symptoms:  No  Any diet changes including alcohol intake, vegetables or greens since the last visit:  No Any illnesses or hospitalizations since the last visit:  No Any signs of clotting since the last visit (including chest discomfort, dizziness, shortness of breath, arm tingling, slurred speech, swelling or redness in leg):  No  MEDICATIONS FOLIC ACID 1 MG  TABS (FOLIC ACID) once daily CARDIZEM CD 360 MG CP24 (DILTIAZEM HCL COATED BEADS) once daily CITRACAL + D 250-200 MG-UNIT TABS (CALCIUM CITRATE-VITAMIN D) Take bid CLARITIN 10 MG TABS (LORATADINE) Take 1 tablet by mouth once a day COUMADIN 5 MG TABS (WARFARIN SODIUM) 5,5,5,21/2 [BMN] CYMBALTA 60 MG CPEP (DULOXETINE HCL) once daily DIOVAN 320 MG  TABS (VALSARTAN) one a day MAXZIDE-25 37.5-25 MG TABS (TRIAMTERENE-HCTZ) once daily [BMN] SYNTHROID 100 MCG TABS (LEVOTHYROXINE SODIUM) once daily ZANTAC 300 MG TABS (RANITIDINE HCL) once daily VITAMIN B-6 CR 200 MG  TBCR (PYRIDOXINE HCL) once daily VITAMIN C 500 MG  CHEW (ASCORBIC ACID) once daily STOOL  SOFTENER 100 MG  CAPS (DOCUSATE SODIUM) once daily PROMETHEGAN 25 MG  SUPP (PROMETHAZINE HCL) as needed VICODIN 5-500 MG  TABS (HYDROCODONE-ACETAMINOPHEN) as needed TYLENOL 325 MG  TABS (ACETAMINOPHEN) as needed TOPROL XL 25 MG TB24 (METOPROLOL SUCCINATE) Take one (1) by mouth daily VITAMIN D 41660 UNIT  CAPS (ERGOCALCIFEROL) one by mouth weekly PHENTERMINE HCL 37.5 MG  TABS (PHENTERMINE HCL) one by mouth q AM SILENOR 6 MG TABS (DOXEPIN HCL) one by mouth at bedtime for sleep

## 2010-10-23 NOTE — Assessment & Plan Note (Signed)
Summary: pt/njr   Nurse Visit   Allergies: 1)  ! * Contrast Dye 2)  Augmentin (Amoxicillin-Pot Clavulanate) Laboratory Results   Blood Tests      INR: 2.5   (Normal Range: 0.88-1.12   Therap INR: 2.0-3.5) Comments: [name]  February 21, 2010 2:01 PM     Orders Added: 1)  Est. Patient Level I [99211] 2)  Protime [[id number]]   ANTICOAGULATION RECORD PREVIOUS REGIMEN & LAB RESULTS Anticoagulation Diagnosis:  v58.83,v58.61,453.41 on  05/19/2007 Previous INR Goal Range:  2.5-3.5 on  05/19/2007 Previous INR:  2.9 on  01/24/2010 Previous Coumadin Dose(mg):  2.5mg ,5mg ,5mg  altinate on  09/10/2008 Previous Regimen:  same on  01/24/2010 Previous Coagulation Comments:  Missed 3 doses  on  12/27/2009  NEW REGIMEN & LAB RESULTS Current INR: 2.5 Regimen: same  Repeat testing in: 4 weeks  Anticoagulation Visit Questionnaire Coumadin dose missed/changed:  No Abnormal Bleeding Symptoms:  No  Any diet changes including alcohol intake, vegetables or greens since the last visit:  No Any illnesses or hospitalizations since the last visit:  No Any signs of clotting since the last visit (including chest discomfort, dizziness, shortness of breath, arm tingling, slurred speech, swelling or redness in leg):  No  MEDICATIONS FOLIC ACID 1 MG  TABS (FOLIC ACID) once daily [BMN] CARDIZEM CD 360 MG CP24 (DILTIAZEM HCL COATED BEADS) once daily CITRACAL + D 250-200 MG-UNIT TABS (CALCIUM CITRATE-VITAMIN D) Take bid CLARITIN 10 MG TABS (LORATADINE) Take 1 tablet by mouth once a day COUMADIN 5 MG TABS (WARFARIN SODIUM) 5,5,5,21/2 [BMN] CYMBALTA 60 MG CPEP (DULOXETINE HCL) once daily DIOVAN 320 MG  TABS (VALSARTAN) one a day MAXZIDE-25 37.5-25 MG TABS (TRIAMTERENE-HCTZ) once daily [BMN] SYNTHROID 100 MCG TABS (LEVOTHYROXINE SODIUM) once daily ZANTAC 300 MG TABS (RANITIDINE HCL) once daily VITAMIN B-6 CR 200 MG  TBCR (PYRIDOXINE HCL) once daily VITAMIN C 500 MG  CHEW (ASCORBIC ACID) once  daily STOOL SOFTENER 100 MG  CAPS (DOCUSATE SODIUM) once daily PROMETHEGAN 25 MG  SUPP (PROMETHAZINE HCL) as needed VICODIN 5-500 MG  TABS (HYDROCODONE-ACETAMINOPHEN) as needed TYLENOL 325 MG  TABS (ACETAMINOPHEN) as needed TOPROL XL 25 MG TB24 (METOPROLOL SUCCINATE) Take one (1) by mouth daily VITAMIN D 45409 UNIT  CAPS (ERGOCALCIFEROL) one by mouth weekly PHENTERMINE HCL 37.5 MG  TABS (PHENTERMINE HCL) one by mouth q AM CLOTRIMAZOLE-BETAMETHASONE 1-0.05 % LOTN (CLOTRIMAZOLE-BETAMETHASONE) apply to ear two times a day HYDROMET 5-1.5 MG/5ML SYRP (HYDROCODONE-HOMATROPINE) one teaspoon q 6-8 hours as needed cough ZITHROMAX Z-PAK 250 MG TABS (AZITHROMYCIN) as directed

## 2010-10-23 NOTE — Progress Notes (Signed)
Summary: sinus  Phone Note Call from Patient   Caller: Patient Call For: Stacie Glaze MD Summary of Call: Pt has been coughing up green and blowing it out of her nose x 3 days.  No fever.  Has sore throat.  Would like RX called to [facility] ([address]).  Initial call taken by: Lynann Beaver CMA,  Jan 27, 2010 9:06 AM  Follow-up for Phone Call        per dr Lovell Sheehan- may have z pack and hycodan-6oz 1 tsp every 8 hours as needed cough Follow-up by: Willy Eddy, LPN,  Jan 27, 1609 11:20 AM    New/Updated Medications: HYDROMET 5-1.5 MG/5ML SYRP (HYDROCODONE-HOMATROPINE) one teaspoon q 6-8 hours as needed cough ZITHROMAX Z-PAK 250 MG TABS (AZITHROMYCIN) as directed Prescriptions: ZITHROMAX Z-PAK 250 MG TABS (AZITHROMYCIN) as directed  #6 x 0   Entered by:   Lynann Beaver CMA   Authorized by:   Stacie Glaze MD   Signed by:   Lynann Beaver CMA on 01/27/2010   Method used:   Telephoned to ...       [facility]  [address]  [phone]* (retail)       [address]       [place], [place]  [address]       Ph: [phone] or [phone]       Fax: [phone]   RxID:   [id number] HYDROMET 5-1.5 MG/5ML SYRP (HYDROCODONE-HOMATROPINE) one teaspoon q 6-8 hours as needed cough  #6 oz x 0   Entered by:   Lynann Beaver CMA   Authorized by:   Stacie Glaze MD   Signed by:   Lynann Beaver CMA on 01/27/2010   Method used:   Telephoned to ...       [facility]  [address]  [phone]* (retail)       [address]       [place], [place]  [address]       Ph: [phone] or [phone]       Fax: [phone]   RxID:   [id number]

## 2010-10-23 NOTE — Assessment & Plan Note (Signed)
Summary: 3 month rov/njr---PT [facility] // RS----PT [facility] // RS   Vital Signs:  Patient profile:   55 year old female Height:      66 inches Weight:      212 pounds BMI:     34.34 Temp:     98.2 degrees F oral Pulse rate:   76 / minute Resp:     14 per minute BP sitting:   134 / 84  (left arm)  Vitals Entered By: Willy Eddy, LPN (November 29, 2009 2:11 PM) CC: roa-form completion   CC:  roa-form completion.  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.1) 3)  Contact or Exposure To Other Viral Diseases  (ICD-V01.79) 4)  Ankle Injury, Right  (ICD-959.7) 5)  Low Back Pain  (ICD-724.2) 6)  Physical Examination  (ICD-V70.0) 7)  Primary Hypercoagulable State  (ICD-289.81) 8)  Acut Suppratv Otitis Media w/o Spont Rup Eardrum  (ICD-382.00) 9)  Injury Other and Unspecified Unspecified Site  (ICD-959.9) 10)  Sinusitis, Acute Nos  (ICD-461.9) 11)  Osteoporosis Nos  (ICD-733.00) 12)  Family History Diabetes 1st Degree Relative  (ICD-V18.0) 13)  Family History of Cad Female 1st Degree Relative <50  (ICD-V17.3) 14)  Embolism/thrombosis, Deep Vsl Prxml Lwr Extrm  (ICD-453.41) 15)  Encounter For Therapeutic Drug Monitoring  (ICD-V58.83) 16)  Deep Venous Thrombophlebitis  (ICD-453.40) 17)  Anticoagulation Therapy  (ICD-V58.61) 18)  Allergic Rhinitis  (ICD-477.9) 19)  Hypothyroidism  (ICD-244.9) 20)  Hypertension  (ICD-401.9) 21)  Gerd  (ICD-530.81) 22)  Skin Cancer, Hx of  (ICD-V10.83)  Current Problems (verified): 1)  Glossitis  (ICD-529.0) 2)  Benign Migratory Glossitis  (ICD-529.1) 3)  Contact or Exposure To Other Viral Diseases  (ICD-V01.79) 4)  Ankle Injury, Right  (ICD-959.7) 5)  Low Back Pain  (ICD-724.2) 6)  Physical Examination  (ICD-V70.0) 7)  Primary Hypercoagulable State  (ICD-289.81) 8)  Acut Suppratv Otitis Media w/o Spont Rup  Eardrum  (ICD-382.00) 9)  Injury Other and Unspecified Unspecified Site  (ICD-959.9) 10)  Sinusitis, Acute Nos  (ICD-461.9) 11)  Osteoporosis Nos  (ICD-733.00) 12)  Family History Diabetes 1st Degree Relative  (ICD-V18.0) 13)  Family History of Cad Female 1st Degree Relative <50  (ICD-V17.3) 14)  Embolism/thrombosis, Deep Vsl Prxml Lwr Extrm  (ICD-453.41) 15)  Encounter For Therapeutic Drug Monitoring  (ICD-V58.83) 16)  Deep Venous Thrombophlebitis  (ICD-453.40) 17)  Anticoagulation Therapy  (ICD-V58.61) 18)  Allergic Rhinitis  (ICD-477.9) 19)  Hypothyroidism  (ICD-244.9) 20)  Hypertension  (ICD-401.9) 21)  Gerd  (ICD-530.81) 22)  Skin Cancer, Hx of  (ICD-V10.83)  Medications Prior to Update: 1)  Folic Acid 1 Mg  Tabs (Folic Acid) .... Once Daily 2)  Cardizem Cd 360 Mg Cp24 (Diltiazem Hcl Coated Beads) .... Once Daily 3)  Citracal + D 250-200 Mg-Unit Tabs (Calcium Citrate-Vitamin D) .... Take Bid 4)  Claritin 10 Mg Tabs (Loratadine) .... Take 1 Tablet By Mouth Once A Day 5)  Coumadin 5 Mg Tabs (Warfarin Sodium) .... 5,5,5,21/2 6)  Cymbalta 60 Mg Cpep (Duloxetine Hcl) .... Once Daily 7)  Darvocet-N 100 100-650 Mg Tabs (Propoxyphene N-Apap) .... Take 1 Tablet By Mouth Every Eight Hours As Needed 8)  Diovan 320 Mg  Tabs (Valsartan) .... One A Day 9)  Maxzide-25 37.5-25 Mg Tabs (Triamterene-Hctz) .... Once Daily 10)  Synthroid 100 Mcg Tabs (Levothyroxine Sodium) .... Once Daily 11)  Zantac 300 Mg Tabs (Ranitidine Hcl) .... Once Daily 12)  Vitamin B-6 Cr 200 Mg  Tbcr (Pyridoxine Hcl) .... Once Daily 13)  Vitamin C 500 Mg  Chew (Ascorbic Acid) .... Once Daily 14)  Stool Softener 100 Mg  Caps (Docusate Sodium) .... Once Daily 15)  Promethegan 25 Mg  Supp (Promethazine Hcl) .... As Needed 16)  Vicodin 5-500 Mg  Tabs (Hydrocodone-Acetaminophen) .... As Needed 17)  Tylenol 325 Mg  Tabs (Acetaminophen) .... As Needed 18)  Toprol Xl 25 Mg Tb24 (Metoprolol Succinate) .... Take One (1) By Mouth  Daily 19)  Vitamin D 98119 Unit  Caps (Ergocalciferol) .... One By Mouth Weekly 20)  Phentermine Hcl 37.5 Mg  Tabs (Phentermine Hcl) .... One By Mouth Q Am 21)  Zofran 4 Mg Tabs (Ondansetron Hcl) .... One By Mouth Every 6-8 Hrs As Needed Nausea  Current Medications (verified): 1)  Folic Acid 1 Mg  Tabs (Folic Acid) .... Once Daily 2)  Cardizem Cd 360 Mg Cp24 (Diltiazem Hcl Coated Beads) .... Once Daily 3)  Citracal + D 250-200 Mg-Unit Tabs (Calcium Citrate-Vitamin D) .... Take Bid 4)  Claritin 10 Mg Tabs (Loratadine) .... Take 1 Tablet By Mouth Once A Day 5)  Coumadin 5 Mg Tabs (Warfarin Sodium) .... 5,5,5,21/2 6)  Cymbalta 60 Mg Cpep (Duloxetine Hcl) .... Once Daily 7)  Diovan 320 Mg  Tabs (Valsartan) .... One A Day 8)  Maxzide-25 37.5-25 Mg Tabs (Triamterene-Hctz) .... Once Daily 9)  Synthroid 100 Mcg Tabs (Levothyroxine Sodium) .... Once Daily 10)  Zantac 300 Mg Tabs (Ranitidine Hcl) .... Once Daily 11)  Vitamin B-6 Cr 200 Mg  Tbcr (Pyridoxine Hcl) .... Once Daily 12)  Vitamin C 500 Mg  Chew (Ascorbic Acid) .... Once Daily 13)  Stool Softener 100 Mg  Caps (Docusate Sodium) .... Once Daily 14)  Promethegan 25 Mg  Supp (Promethazine Hcl) .... As Needed 15)  Vicodin 5-500 Mg  Tabs (Hydrocodone-Acetaminophen) .... As Needed 16)  Tylenol 325 Mg  Tabs (Acetaminophen) .... As Needed 17)  Toprol Xl 25 Mg Tb24 (Metoprolol Succinate) .... Take One (1) By Mouth Daily 18)  Vitamin D 14782 Unit  Caps (Ergocalciferol) .... One By Mouth Weekly 19)  Phentermine Hcl 37.5 Mg  Tabs (Phentermine Hcl) .... One By Mouth Q Am 20)  Clotrimazole-Betamethasone 1-0.05 % Lotn (Clotrimazole-Betamethasone) .... Apply To Ear Two Times A Day  Allergies (verified): 1)  ! * Contrast Dye 2)  Augmentin (Amoxicillin-Pot Clavulanate)  Past History:  Family History: Last updated: 05/25/2007 father Family History of CAD Female 1st degree relative <50 Family History Diabetes 1st degree relative Family History  Hypertension  Social History: Last updated: 05/25/2007 Married Never Smoked  Risk Factors: Smoking Status: never (11/29/2009)  Past medical, surgical, family and social histories (including risk factors) reviewed, and no changes noted (except as noted below).  Past Medical History: Reviewed history from 06/15/2008 and no changes required. chronic anticoagulation dx Skin cancer, hx of/melanoma GERD Hypertension Hypothyroidism Allergic rhinitis Anticoagulation therapy lupus anticoagulant Low back pain  Past Surgical History: Reviewed history from 05/25/2007 and no changes required. melanoma removal/face/stage3 Cholecystectomy Hysterectomy Lumbar laminectomy steroid injections GYN surgery prior DVT Lumpectomy cysts and lipomas Oophorectomy 1999 arthroscopy left knee  Family History: Reviewed history from 05/25/2007 and no changes required. father Family History of CAD Female 1st degree relative <50 Family History Diabetes 1st degree relative Family History Hypertension  Social History: Reviewed history from 05/25/2007 and no changes required. Married Never Smoked  Review of Systems  The patient denies anorexia, fever, weight loss, weight gain, vision loss, decreased hearing, hoarseness, chest pain, syncope, dyspnea on exertion, peripheral edema, prolonged cough, headaches, hemoptysis, abdominal pain, melena, hematochezia, severe indigestion/heartburn, hematuria, incontinence, genital sores, muscle weakness, suspicious skin lesions, transient blindness, difficulty walking, depression, unusual weight change, abnormal bleeding, enlarged lymph nodes, angioedema, and breast masses.    Physical Exam  General:  overweight-appearing.  overweight-appearing.   Head:  Normocephalic and atraumatic without obvious abnormalities. No apparent alopecia or balding. Eyes:  vision grossly intact, pupils equal, pupils round, pupils reactive to light, corneas and lenses clear;  conjunctival injection on the right; the right upper lid was slightly edematous and red Ears:  R ear normal and L ear normal.   Nose:  no external deformity and no nasal discharge.   Mouth:  pharynx pink and moist and no posterior lymphoid hypertrophy.   Neck:  No deformities, masses, or tenderness noted. Lungs:  Normal respiratory effort, chest expands symmetrically. Lungs are clear to auscultation, no crackles or wheezes. Heart:  Normal rate and regular rhythm. S1 and S2 normal without gallop, murmur, click, rub or other extra sounds. Abdomen:  Bowel sounds positive,abdomen soft and non-tender without masses, organomegaly or hernias noted.   Impression & Recommendations:  Problem # 1:  OSTEOPOROSIS NOS (ICD-733.00)  Discussed medication use, applications of heat or ice, and exercises.   Problem # 2:  OVERWEIGHT (ICD-278.02)  has been on the pheteramine but has  not been exercizing ned to get back on the treadmil  Ht: 66 (11/29/2009)   Wt: 212 (11/29/2009)   BMI: 34.34 (11/29/2009)  Problem # 3:  ENCOUNTER FOR THERAPEUTIC DRUG MONITORING (ICD-V58.83) protime next week due will do this today  Problem # 4:  GERD (ICD-530.81) discusson of food and symptoms Her updated medication list for this problem includes:    Zantac 300 Mg Tabs (Ranitidine hcl) ..... Once daily  Labs Reviewed: Hgb: 13.4 (04/09/2009)   Hct: 38.5 (04/09/2009)  Problem # 5:  SEBORRHEA (ICD-706.3) clotrimazole lotion  Problem # 6:  HYPOTHYROIDISM (ICD-244.9)  Her updated medication list for this problem includes:    Synthroid 100 Mcg Tabs (Levothyroxine sodium) ..... Once daily  Labs Reviewed: TSH: 4.95 (04/09/2009)    HgBA1c: 6.5 (08/06/2009) Chol: 151 (04/09/2009)   HDL: 55.20 (04/09/2009)   LDL: 71 (04/09/2009)   TG: 123.0 (04/09/2009)  Complete Medication List: 1)  Folic Acid 1 Mg Tabs (Folic acid) .... Once daily 2)  Cardizem Cd 360 Mg Cp24 (Diltiazem hcl coated beads) .... Once daily 3)   Citracal + D 250-200 Mg-unit Tabs (Calcium citrate-vitamin d) .... Take bid 4)  Claritin 10 Mg Tabs (Loratadine) .... Take 1 tablet by mouth once a day 5)  Coumadin 5 Mg Tabs (Warfarin sodium) .... 5,5,5,21/2 6)  Cymbalta 60 Mg Cpep (Duloxetine hcl) .... Once daily 7)  Diovan 320 Mg Tabs (Valsartan) .... One a day 8)  Maxzide-25 37.5-25 Mg Tabs (Triamterene-hctz) .... Once daily 9)  Synthroid 100 Mcg Tabs (Levothyroxine sodium) .... Once daily 10)  Zantac 300 Mg Tabs (Ranitidine hcl) .... Once daily 11)  Vitamin B-6 Cr 200 Mg Tbcr (Pyridoxine hcl) .... Once daily 12)  Vitamin C 500 Mg Chew (Ascorbic acid) .... Once daily 13)  Stool Softener 100 Mg Caps (Docusate sodium) .... Once daily 14)  Promethegan 25 Mg Supp (Promethazine hcl) .... As needed 15)  Vicodin 5-500 Mg Tabs (Hydrocodone-acetaminophen) .... As needed 16)  Tylenol 325 Mg Tabs (Acetaminophen) .... As needed 17)  Toprol  Xl 25 Mg Tb24 (Metoprolol succinate) .... Take one (1) by mouth daily 18)  Vitamin D 30865 Unit Caps (Ergocalciferol) .... One by mouth weekly 19)  Phentermine Hcl 37.5 Mg Tabs (Phentermine hcl) .... One by mouth q am 20)  Clotrimazole-betamethasone 1-0.05 % Lotn (Clotrimazole-betamethasone) .... Apply to ear two times a day  Other Orders: Fingerstick ([id number]) Protime ([id number])  Patient Instructions: 1)  Please schedule a follow-up appointment in 3 months. Prescriptions: CLOTRIMAZOLE-BETAMETHASONE 1-0.05 % LOTN (CLOTRIMAZOLE-BETAMETHASONE) apply to ear two times a day  #30cc x 1   Entered and Authorized by:   Stacie Glaze MD   Signed by:   Stacie Glaze MD on 11/29/2009   Method used:   Electronically to        [facility]  [phone]* (retail)       [address]       [place], [place]  [address]       Ph: [phone] or [phone]       Fax: [phone]   RxID:   [id number]   Appended Document: Orders Update     Clinical Lists Changes  Orders: Added  new Service order of Prescription Created Electronically [id number]) - Signed      Appended Document: 3 month rov/njr---PT [facility] // RS----PT [facility] // RS  Laboratory Results   Blood Tests     PT: 19.9 s   (Normal Range: 10.6-13.4)  INR: 2.7   (Normal Range: 0.88-1.12   Therap INR: 2.0-3.5) Comments: [name]  November 29, 2009 3:07 PM       ANTICOAGULATION RECORD PREVIOUS REGIMEN & LAB RESULTS Anticoagulation Diagnosis:  v58.83,v58.61,453.41 on  05/19/2007 Previous INR Goal Range:  2.5-3.5 on  05/19/2007 Previous INR:  3.8 on  11/05/2009 Previous Coumadin Dose(mg):  2.5mg ,5mg ,5mg  altinate on  09/10/2008 Previous Regimen:  same dose on  11/05/2009 Previous Coagulation Comments:  hold for 1 days by Dr. Tawanna Cooler on  08/26/2007  NEW REGIMEN & LAB RESULTS Current INR: 2.7 Regimen: same  Repeat testing in: 1 month  Anticoagulation Visit Questionnaire Coumadin dose missed/changed:  No Abnormal Bleeding Symptoms:  No  Any diet changes including alcohol intake, vegetables or greens since the last visit:  No Any illnesses or hospitalizations since the last visit:  No Any signs of clotting since the last visit (including chest discomfort, dizziness, shortness of breath, arm tingling, slurred speech, swelling or redness in leg):  No  MEDICATIONS FOLIC ACID 1 MG  TABS (FOLIC ACID) once daily [BMN] CARDIZEM CD 360 MG CP24 (DILTIAZEM HCL COATED BEADS) once daily CITRACAL + D 250-200 MG-UNIT TABS (CALCIUM CITRATE-VITAMIN D) Take bid CLARITIN 10 MG TABS (LORATADINE) Take 1 tablet by mouth once a day COUMADIN 5 MG TABS (WARFARIN SODIUM) 5,5,5,21/2 [BMN] CYMBALTA 60 MG CPEP (DULOXETINE HCL) once daily DIOVAN 320 MG  TABS (VALSARTAN) one a day MAXZIDE-25 37.5-25 MG TABS (TRIAMTERENE-HCTZ) once daily [BMN] SYNTHROID 100 MCG TABS (LEVOTHYROXINE SODIUM) once daily ZANTAC 300 MG TABS (RANITIDINE HCL) once daily VITAMIN B-6 CR 200 MG  TBCR (PYRIDOXINE HCL) once daily VITAMIN C 500 MG  CHEW  (ASCORBIC ACID) once daily STOOL SOFTENER 100 MG  CAPS (DOCUSATE SODIUM) once daily PROMETHEGAN 25 MG  SUPP (PROMETHAZINE HCL) as needed VICODIN 5-500 MG  TABS (HYDROCODONE-ACETAMINOPHEN) as needed TYLENOL 325 MG  TABS (ACETAMINOPHEN) as needed TOPROL XL 25 MG TB24 (METOPROLOL SUCCINATE) Take one (1) by mouth daily VITAMIN D 73220 UNIT  CAPS (ERGOCALCIFEROL) one by  mouth weekly PHENTERMINE HCL 37.5 MG  TABS (PHENTERMINE HCL) one by mouth q AM CLOTRIMAZOLE-BETAMETHASONE 1-0.05 % LOTN (CLOTRIMAZOLE-BETAMETHASONE) apply to ear two times a day

## 2010-10-23 NOTE — Assessment & Plan Note (Signed)
Summary: PT/RCD  Nurse Visit   Allergies: 1)  ! * Contrast Dye 2)  Augmentin (Amoxicillin-Pot Clavulanate) Laboratory Results   Blood Tests    Date/Time Reported: November 06, 2009 11:51 AM   PT: 23.5 s   (Normal Range: 10.6-13.4)  INR: 3.8   (Normal Range: 0.88-1.12   Therap INR: 2.0-3.5) Comments: Wynona Canes, CMA  November 06, 2009 11:52 AM     Laboratory Results   Blood Tests     PT: 23.5 s   (Normal Range: 10.6-13.4)  INR: 3.8   (Normal Range: 0.88-1.12   Therap INR: 2.0-3.5) Comments: Wynona Canes, CMA  November 06, 2009 11:52 AM       ANTICOAGULATION RECORD PREVIOUS REGIMEN & LAB RESULTS Anticoagulation Diagnosis:  v58.83,v58.61,453.41 on  05/19/2007 Previous INR Goal Range:  2.5-3.5 on  05/19/2007 Previous INR:  2.2 on  10/04/2009 Previous Coumadin Dose(mg):  2.5mg ,5mg ,5mg  altinate on  09/10/2008 Previous Regimen:  same on  10/04/2009 Previous Coagulation Comments:  hold for 1 days by Dr. Tawanna Cooler on  08/26/2007  NEW REGIMEN & LAB RESULTS Current INR: 3.8 Regimen: same dose  Provider: Dr.Eddie Payette      Repeat testing in: 4 weeks MEDICATIONS FOLIC ACID 1 MG  TABS (FOLIC ACID) once daily [BMN] CARDIZEM CD 360 MG CP24 (DILTIAZEM HCL COATED BEADS) once daily CITRACAL + D 250-200 MG-UNIT TABS (CALCIUM CITRATE-VITAMIN D) Take bid CLARITIN 10 MG TABS (LORATADINE) Take 1 tablet by mouth once a day COUMADIN 5 MG TABS (WARFARIN SODIUM) 5,5,5,21/2 [BMN] CYMBALTA 60 MG CPEP (DULOXETINE HCL) once daily DARVOCET-N 100 100-650 MG TABS (PROPOXYPHENE N-APAP) Take 1 tablet by mouth every eight hours as needed DIOVAN 320 MG  TABS (VALSARTAN) one a day MAXZIDE-25 37.5-25 MG TABS (TRIAMTERENE-HCTZ) once daily [BMN] SYNTHROID 100 MCG TABS (LEVOTHYROXINE SODIUM) once daily ZANTAC 300 MG TABS (RANITIDINE HCL) once daily VITAMIN B-6 CR 200 MG  TBCR (PYRIDOXINE HCL) once daily VITAMIN C 500 MG  CHEW (ASCORBIC ACID) once daily STOOL SOFTENER 100 MG  CAPS (DOCUSATE  SODIUM) once daily PROMETHEGAN 25 MG  SUPP (PROMETHAZINE HCL) as needed VICODIN 5-500 MG  TABS (HYDROCODONE-ACETAMINOPHEN) as needed TYLENOL 325 MG  TABS (ACETAMINOPHEN) as needed TOPROL XL 25 MG TB24 (METOPROLOL SUCCINATE) Take one (1) by mouth daily VITAMIN D 16109 UNIT  CAPS (ERGOCALCIFEROL) one by mouth weekly PHENTERMINE HCL 37.5 MG  TABS (PHENTERMINE HCL) one by mouth q AM ZOFRAN 4 MG TABS (ONDANSETRON HCL) One by mouth every 6-8 hrs as needed nausea   Anticoagulation Visit Questionnaire      Coumadin dose missed/changed:  No      Abnormal Bleeding Symptoms:  No   Any diet changes including alcohol intake, vegetables or greens since the last visit:  No Any illnesses or hospitalizations since the last visit:  No Any signs of clotting since the last visit (including chest discomfort, dizziness, shortness of breath, arm tingling, slurred speech, swelling or redness in leg):  No

## 2010-10-23 NOTE — Assessment & Plan Note (Signed)
Summary: PT // RS   Nurse Visit   Allergies: 1)  ! * Contrast Dye 2)  Augmentin (Amoxicillin-Pot Clavulanate) Laboratory Results   Blood Tests      INR: 2.2   (Normal Range: 0.88-1.12   Therap INR: 2.0-3.5) Comments: Erin Kirk  September 08, 2010 2:18 PM      Orders Added: 1)  Est. Patient Level I [99211] 2)  Protime [[id number]]   ANTICOAGULATION RECORD PREVIOUS REGIMEN & LAB RESULTS Anticoagulation Diagnosis:  v58.83,v58.61,453.41 on  05/19/2007 Previous INR Goal Range:  2.5-3.5 on  05/19/2007 Previous INR:  3.0 on  08/11/2010 Previous Coumadin Dose(mg):  2.5mg ,5mg ,5mg  altinate on  09/10/2008 Previous Regimen:  same on  07/15/2010 Previous Coagulation Comments:  Missed 3 doses  on  12/27/2009  NEW REGIMEN & LAB RESULTS Current INR: 2.2 Regimen: same  Repeat testing in: 4 weeks  Anticoagulation Visit Questionnaire Coumadin dose missed/changed:  No Abnormal Bleeding Symptoms:  No  Any diet changes including alcohol intake, vegetables or greens since the last visit:  No Any illnesses or hospitalizations since the last visit:  No Any signs of clotting since the last visit (including chest discomfort, dizziness, shortness of breath, arm tingling, slurred speech, swelling or redness in leg):  No  MEDICATIONS FOLIC ACID 1 MG  TABS (FOLIC ACID) once daily CARDIZEM CD 360 MG CP24 (DILTIAZEM HCL COATED BEADS) once daily CITRACAL + D 250-200 MG-UNIT TABS (CALCIUM CITRATE-VITAMIN D) Take bid CLARITIN 10 MG TABS (LORATADINE) Take 1 tablet by mouth once a day COUMADIN 5 MG TABS (WARFARIN SODIUM) 5,5,5,21/2 [BMN] CYMBALTA 60 MG CPEP (DULOXETINE HCL) once daily DIOVAN 320 MG  TABS (VALSARTAN) one a day MAXZIDE-25 37.5-25 MG TABS (TRIAMTERENE-HCTZ) once daily [BMN] SYNTHROID 100 MCG TABS (LEVOTHYROXINE SODIUM) once daily ZANTAC 300 MG TABS (RANITIDINE HCL) once daily VITAMIN B-6 CR 200 MG  TBCR (PYRIDOXINE HCL) once daily VITAMIN C 500 MG  CHEW (ASCORBIC ACID) once  daily STOOL SOFTENER 100 MG  CAPS (DOCUSATE SODIUM) once daily PROMETHEGAN 25 MG  SUPP (PROMETHAZINE HCL) as needed VICODIN 5-500 MG  TABS (HYDROCODONE-ACETAMINOPHEN) as needed TYLENOL 325 MG  TABS (ACETAMINOPHEN) as needed TOPROL XL 25 MG TB24 (METOPROLOL SUCCINATE) Take one (1) by mouth daily VITAMIN D 96295 UNIT  CAPS (ERGOCALCIFEROL) one by mouth weekly PHENTERMINE HCL 37.5 MG  TABS (PHENTERMINE HCL) one by mouth q AM SILENOR 6 MG TABS (DOXEPIN HCL) one by mouth at bedtime for sleep

## 2010-10-23 NOTE — Assessment & Plan Note (Signed)
Summary: pt//ccm/pt rsc/cjr   Nurse Visit   Allergies: 1)  ! * Contrast Dye 2)  Augmentin (Amoxicillin-Pot Clavulanate) Laboratory Results   Blood Tests   Date/Time Received: August 11, 2010 2:21 PM  Date/Time Reported: August 11, 2010 2:21 PM    INR: 3.0   (Normal Range: 0.88-1.12   Therap INR: 2.0-3.5) Comments: Wynona Canes, CMA  August 11, 2010 2:21 PM     Orders Added: 1)  Est. Patient Level I [99211] 2)  Protime [[id number]]  Laboratory Results   Blood Tests      INR: 3.0   (Normal Range: 0.88-1.12   Therap INR: 2.0-3.5) Comments: Wynona Canes, CMA  August 11, 2010 2:21 PM       ANTICOAGULATION RECORD PREVIOUS REGIMEN & LAB RESULTS Anticoagulation Diagnosis:  v58.83,v58.61,453.41 on  05/19/2007 Previous INR Goal Range:  2.5-3.5 on  05/19/2007 Previous INR:  2.8 on  07/15/2010 Previous Coumadin Dose(mg):  2.5mg ,5mg ,5mg  altinate on  09/10/2008 Previous Regimen:  same on  07/15/2010 Previous Coagulation Comments:  Missed 3 doses  on  12/27/2009  NEW REGIMEN & LAB RESULTS Current INR: 3.0 Regimen: same  (no change)       Repeat testing in: 4 weeks MEDICATIONS FOLIC ACID 1 MG  TABS (FOLIC ACID) once daily CARDIZEM CD 360 MG CP24 (DILTIAZEM HCL COATED BEADS) once daily CITRACAL + D 250-200 MG-UNIT TABS (CALCIUM CITRATE-VITAMIN D) Take bid CLARITIN 10 MG TABS (LORATADINE) Take 1 tablet by mouth once a day COUMADIN 5 MG TABS (WARFARIN SODIUM) 5,5,5,21/2 [BMN] CYMBALTA 60 MG CPEP (DULOXETINE HCL) once daily DIOVAN 320 MG  TABS (VALSARTAN) one a day MAXZIDE-25 37.5-25 MG TABS (TRIAMTERENE-HCTZ) once daily [BMN] SYNTHROID 100 MCG TABS (LEVOTHYROXINE SODIUM) once daily ZANTAC 300 MG TABS (RANITIDINE HCL) once daily VITAMIN B-6 CR 200 MG  TBCR (PYRIDOXINE HCL) once daily VITAMIN C 500 MG  CHEW (ASCORBIC ACID) once daily STOOL SOFTENER 100 MG  CAPS (DOCUSATE SODIUM) once daily PROMETHEGAN 25 MG  SUPP (PROMETHAZINE HCL) as needed VICODIN 5-500  MG  TABS (HYDROCODONE-ACETAMINOPHEN) as needed TYLENOL 325 MG  TABS (ACETAMINOPHEN) as needed TOPROL XL 25 MG TB24 (METOPROLOL SUCCINATE) Take one (1) by mouth daily VITAMIN D 45409 UNIT  CAPS (ERGOCALCIFEROL) one by mouth weekly PHENTERMINE HCL 37.5 MG  TABS (PHENTERMINE HCL) one by mouth q AM SILENOR 6 MG TABS (DOXEPIN HCL) one by mouth at bedtime for sleep   Anticoagulation Visit Questionnaire      Coumadin dose missed/changed:  No      Abnormal Bleeding Symptoms:  No   Any diet changes including alcohol intake, vegetables or greens since the last visit:  No Any illnesses or hospitalizations since the last visit:  No Any signs of clotting since the last visit (including chest discomfort, dizziness, shortness of breath, arm tingling, slurred speech, swelling or redness in leg):  No

## 2010-10-23 NOTE — Assessment & Plan Note (Signed)
Summary: PT LABS//CCM   Nurse Visit   Allergies: 1)  ! * Contrast Dye 2)  Augmentin (Amoxicillin-Pot Clavulanate) Laboratory Results   Blood Tests      INR: 3.1   (Normal Range: 0.88-1.12   Therap INR: 2.0-3.5) Comments: [name]  June 17, 2010 2:02 PM     Orders Added: 1)  Est. Patient Level I [99211] 2)  Protime [[id number]]   ANTICOAGULATION RECORD PREVIOUS REGIMEN & LAB RESULTS Anticoagulation Diagnosis:  v58.83,v58.61,453.41 on  05/19/2007 Previous INR Goal Range:  2.5-3.5 on  05/19/2007 Previous INR:  2.8 on  05/20/2010 Previous Coumadin Dose(mg):  2.5mg ,5mg ,5mg  altinate on  09/10/2008 Previous Regimen:  same on  05/20/2010 Previous Coagulation Comments:  Missed 3 doses  on  12/27/2009  NEW REGIMEN & LAB RESULTS Current INR: 3.1 Regimen: same  Repeat testing in: 4 weeks  Anticoagulation Visit Questionnaire Coumadin dose missed/changed:  No Abnormal Bleeding Symptoms:  No  Any diet changes including alcohol intake, vegetables or greens since the last visit:  No Any illnesses or hospitalizations since the last visit:  No Any signs of clotting since the last visit (including chest discomfort, dizziness, shortness of breath, arm tingling, slurred speech, swelling or redness in leg):  No  MEDICATIONS FOLIC ACID 1 MG  TABS (FOLIC ACID) once daily CARDIZEM CD 360 MG CP24 (DILTIAZEM HCL COATED BEADS) once daily CITRACAL + D 250-200 MG-UNIT TABS (CALCIUM CITRATE-VITAMIN D) Take bid CLARITIN 10 MG TABS (LORATADINE) Take 1 tablet by mouth once a day COUMADIN 5 MG TABS (WARFARIN SODIUM) 5,5,5,21/2 [BMN] CYMBALTA 60 MG CPEP (DULOXETINE HCL) once daily DIOVAN 320 MG  TABS (VALSARTAN) one a day MAXZIDE-25 37.5-25 MG TABS (TRIAMTERENE-HCTZ) once daily [BMN] SYNTHROID 100 MCG TABS (LEVOTHYROXINE SODIUM) once daily ZANTAC 300 MG TABS (RANITIDINE HCL) once daily VITAMIN B-6 CR 200 MG  TBCR (PYRIDOXINE HCL) once daily VITAMIN C 500 MG  CHEW (ASCORBIC ACID) once  daily STOOL SOFTENER 100 MG  CAPS (DOCUSATE SODIUM) once daily PROMETHEGAN 25 MG  SUPP (PROMETHAZINE HCL) as needed VICODIN 5-500 MG  TABS (HYDROCODONE-ACETAMINOPHEN) as needed TYLENOL 325 MG  TABS (ACETAMINOPHEN) as needed TOPROL XL 25 MG TB24 (METOPROLOL SUCCINATE) Take one (1) by mouth daily VITAMIN D 70623 UNIT  CAPS (ERGOCALCIFEROL) one by mouth weekly PHENTERMINE HCL 37.5 MG  TABS (PHENTERMINE HCL) one by mouth q AM SILENOR 6 MG TABS (DOXEPIN HCL) one by mouth at bedtime for sleep

## 2010-10-23 NOTE — Assessment & Plan Note (Signed)
Summary: pt/Erin Kirk   Nurse Visit   Allergies: 1)  ! * Contrast Dye 2)  Augmentin (Amoxicillin-Pot Clavulanate) Laboratory Results   Blood Tests     PT: 18.2 s   (Normal Range: 10.6-13.4)  INR: 2.2   (Normal Range: 0.88-1.12   Therap INR: 2.0-3.5) Comments: [name]  October 04, 2009 2:37 PM     Orders Added: 1)  Est. Patient Level I [99211] 2)  Protime [[id number]]   ANTICOAGULATION RECORD PREVIOUS REGIMEN & LAB RESULTS Anticoagulation Diagnosis:  v58.83,v58.61,453.41 on  05/19/2007 Previous INR Goal Range:  2.5-3.5 on  05/19/2007 Previous INR:  3.3 on  09/06/2009 Previous Coumadin Dose(mg):  2.5mg ,5mg ,5mg  altinate on  09/10/2008 Previous Regimen:  same on  09/06/2009 Previous Coagulation Comments:  hold for 1 days by Dr. Tawanna Cooler on  08/26/2007  NEW REGIMEN & LAB RESULTS Current INR: 2.2 Regimen: same  Repeat testing in: 1 month  Anticoagulation Visit Questionnaire Coumadin dose missed/changed:  No Abnormal Bleeding Symptoms:  No  Any diet changes including alcohol intake, vegetables or greens since the last visit:  No Any illnesses or hospitalizations since the last visit:  No Any signs of clotting since the last visit (including chest discomfort, dizziness, shortness of breath, arm tingling, slurred speech, swelling or redness in leg):  No  MEDICATIONS FOLIC ACID 1 MG  TABS (FOLIC ACID) once daily [BMN] CARDIZEM CD 360 MG CP24 (DILTIAZEM HCL COATED BEADS) once daily CITRACAL + D 250-200 MG-UNIT TABS (CALCIUM CITRATE-VITAMIN D) Take bid CLARITIN 10 MG TABS (LORATADINE) Take 1 tablet by mouth once a day COUMADIN 5 MG TABS (WARFARIN SODIUM) 5,5,5,21/2 [BMN] CYMBALTA 60 MG CPEP (DULOXETINE HCL) once daily DARVOCET-N 100 100-650 MG TABS (PROPOXYPHENE N-APAP) Take 1 tablet by mouth every eight hours as needed DIOVAN 320 MG  TABS (VALSARTAN) one a day MAXZIDE-25 37.5-25 MG TABS (TRIAMTERENE-HCTZ) once daily [BMN] SYNTHROID 100 MCG TABS (LEVOTHYROXINE SODIUM) once  daily ZANTAC 300 MG TABS (RANITIDINE HCL) once daily VITAMIN B-6 CR 200 MG  TBCR (PYRIDOXINE HCL) once daily VITAMIN C 500 MG  CHEW (ASCORBIC ACID) once daily STOOL SOFTENER 100 MG  CAPS (DOCUSATE SODIUM) once daily PROMETHEGAN 25 MG  SUPP (PROMETHAZINE HCL) as needed VICODIN 5-500 MG  TABS (HYDROCODONE-ACETAMINOPHEN) as needed TYLENOL 325 MG  TABS (ACETAMINOPHEN) as needed TOPROL XL 25 MG TB24 (METOPROLOL SUCCINATE) Take one (1) by mouth daily VITAMIN D 82956 UNIT  CAPS (ERGOCALCIFEROL) one by mouth weekly PHENTERMINE HCL 37.5 MG  TABS (PHENTERMINE HCL) one by mouth q AM

## 2010-10-23 NOTE — Assessment & Plan Note (Signed)
Summary: pt//ccm---PT [facility] // RS   Nurse Visit   Allergies: 1)  ! * Contrast Dye 2)  Augmentin (Amoxicillin-Pot Clavulanate) Laboratory Results   Blood Tests   Date/Time Received: March 20, 2010 3:25 PM  Date/Time Reported: March 20, 2010 3:25 PM     INR: 2.8   (Normal Range: 0.88-1.12   Therap INR: 2.0-3.5) Comments: Wynona Canes, CMA  March 20, 2010 3:25 PM     Orders Added: 1)  Est. Patient Level I [99211] 2)  Protime [[id number]]  Laboratory Results   Blood Tests      INR: 2.8   (Normal Range: 0.88-1.12   Therap INR: 2.0-3.5) Comments: Wynona Canes, CMA  March 20, 2010 3:25 PM       ANTICOAGULATION RECORD PREVIOUS REGIMEN & LAB RESULTS Anticoagulation Diagnosis:  v58.83,v58.61,453.41 on  05/19/2007 Previous INR Goal Range:  2.5-3.5 on  05/19/2007 Previous INR:  2.5 on  02/21/2010 Previous Coumadin Dose(mg):  2.5mg ,5mg ,5mg  altinate on  09/10/2008 Previous Regimen:  same on  02/21/2010 Previous Coagulation Comments:  Missed 3 doses  on  12/27/2009  NEW REGIMEN & LAB RESULTS Current INR: 2.8 Regimen: same  (no change)       Repeat testing in: 4 weeks MEDICATIONS FOLIC ACID 1 MG  TABS (FOLIC ACID) once daily [BMN] CARDIZEM CD 360 MG CP24 (DILTIAZEM HCL COATED BEADS) once daily CITRACAL + D 250-200 MG-UNIT TABS (CALCIUM CITRATE-VITAMIN D) Take bid CLARITIN 10 MG TABS (LORATADINE) Take 1 tablet by mouth once a day COUMADIN 5 MG TABS (WARFARIN SODIUM) 5,5,5,21/2 [BMN] CYMBALTA 60 MG CPEP (DULOXETINE HCL) once daily DIOVAN 320 MG  TABS (VALSARTAN) one a day MAXZIDE-25 37.5-25 MG TABS (TRIAMTERENE-HCTZ) once daily [BMN] SYNTHROID 100 MCG TABS (LEVOTHYROXINE SODIUM) once daily ZANTAC 300 MG TABS (RANITIDINE HCL) once daily VITAMIN B-6 CR 200 MG  TBCR (PYRIDOXINE HCL) once daily VITAMIN C 500 MG  CHEW (ASCORBIC ACID) once daily STOOL SOFTENER 100 MG  CAPS (DOCUSATE SODIUM) once daily PROMETHEGAN 25 MG  SUPP (PROMETHAZINE HCL) as needed VICODIN 5-500 MG   TABS (HYDROCODONE-ACETAMINOPHEN) as needed TYLENOL 325 MG  TABS (ACETAMINOPHEN) as needed TOPROL XL 25 MG TB24 (METOPROLOL SUCCINATE) Take one (1) by mouth daily VITAMIN D 45409 UNIT  CAPS (ERGOCALCIFEROL) one by mouth weekly PHENTERMINE HCL 37.5 MG  TABS (PHENTERMINE HCL) one by mouth q AM CLOTRIMAZOLE-BETAMETHASONE 1-0.05 % LOTN (CLOTRIMAZOLE-BETAMETHASONE) apply to ear two times a day HYDROMET 5-1.5 MG/5ML SYRP (HYDROCODONE-HOMATROPINE) one teaspoon q 6-8 hours as needed cough ZITHROMAX Z-PAK 250 MG TABS (AZITHROMYCIN) as directed   Anticoagulation Visit Questionnaire      Coumadin dose missed/changed:  No      Abnormal Bleeding Symptoms:  No   Any diet changes including alcohol intake, vegetables or greens since the last visit:  No Any illnesses or hospitalizations since the last visit:  No Any signs of clotting since the last visit (including chest discomfort, dizziness, shortness of breath, arm tingling, slurred speech, swelling or redness in leg):  No

## 2010-10-23 NOTE — Letter (Signed)
Summary: [facility]  [facility] [place]   Imported By: Maryln Gottron 11/28/2009 11:17:32  _____________________________________________________________________  External Attachment:    Type:   Image     Comment:   External Document

## 2010-10-23 NOTE — Assessment & Plan Note (Signed)
Summary: 3 month fup//ccm pt rsc/njr   Vital Signs:  Patient profile:   55 year old female Height:      66 inches Weight:      223 pounds BMI:     36.12 Temp:     98.2 degrees F oral Pulse rate:   72 / minute Resp:     14 per minute BP sitting:   126 / 82  (left arm)  Vitals Entered By: Willy Eddy, LPN (April 02, 2010 10:35 AM) CC: roa, Hypertension Management   CC:  roa and Hypertension Management.  History of Present Illness: the stress of her husbands ilness has created weight gain she is stress eating  Hypertension History:      She denies headache, chest pain, palpitations, dyspnea with exertion, orthopnea, PND, peripheral edema, visual symptoms, neurologic problems, syncope, and side effects from treatment.        Positive major cardiovascular risk factors include female age 71 years old or older, hyperlipidemia, and hypertension.  Negative major cardiovascular risk factors include non-tobacco-user status.     Preventive Screening-Counseling & Management  Alcohol-Tobacco     Smoking Status: never  Problems Prior to Update: 1)  Seborrhea  (ICD-706.3) 2)  Overweight  (ICD-278.02) 3)  Glossitis  (ICD-529.0) 4)  Benign Migratory Glossitis  (ICD-529.1) 5)  Contact or Exposure To Other Viral Diseases  (ICD-V01.79) 6)  Ankle Injury, Right  (ICD-959.7) 7)  Low Back Pain  (ICD-724.2) 8)  Physical Examination  (ICD-V70.0) 9)  Primary Hypercoagulable State  (ICD-289.81) 10)  Acut Suppratv Otitis Media w/o Spont Rup Eardrum  (ICD-382.00) 11)  Injury Other and Unspecified Unspecified Site  (ICD-959.9) 12)  Sinusitis, Acute Nos  (ICD-461.9) 13)  Osteoporosis Nos  (ICD-733.00) 14)  Family History Diabetes 1st Degree Relative  (ICD-V18.0) 15)  Family History of Cad Female 1st Degree Relative <50  (ICD-V17.3) 16)  Embolism/thrombosis, Deep Vsl Prxml Lwr Extrm  (ICD-453.41) 17)  Encounter For Therapeutic Drug Monitoring  (ICD-V58.83) 18)  Deep Venous Thrombophlebitis   (ICD-453.40) 19)  Anticoagulation Therapy  (ICD-V58.61) 20)  Allergic Rhinitis  (ICD-477.9) 21)  Hypothyroidism  (ICD-244.9) 22)  Hypertension  (ICD-401.9) 23)  Gerd  (ICD-530.81) 24)  Skin Cancer, Hx of  (ICD-V10.83)  Current Problems (verified): 1)  Seborrhea  (ICD-706.3) 2)  Overweight  (ICD-278.02) 3)  Glossitis  (ICD-529.0) 4)  Benign Migratory Glossitis  (ICD-529.1) 5)  Contact or Exposure To Other Viral Diseases  (ICD-V01.79) 6)  Ankle Injury, Right  (ICD-959.7) 7)  Low Back Pain  (ICD-724.2) 8)  Physical Examination  (ICD-V70.0) 9)  Primary Hypercoagulable State  (ICD-289.81) 10)  Acut Suppratv Otitis Media w/o Spont Rup Eardrum  (ICD-382.00) 11)  Injury Other and Unspecified Unspecified Site  (ICD-959.9) 12)  Sinusitis, Acute Nos  (ICD-461.9) 13)  Osteoporosis Nos  (ICD-733.00) 14)  Family History Diabetes 1st Degree Relative  (ICD-V18.0) 15)  Family History of Cad Female 1st Degree Relative <50  (ICD-V17.3) 16)  Embolism/thrombosis, Deep Vsl Prxml Lwr Extrm  (ICD-453.41) 17)  Encounter For Therapeutic Drug Monitoring  (ICD-V58.83) 18)  Deep Venous Thrombophlebitis  (ICD-453.40) 19)  Anticoagulation Therapy  (ICD-V58.61) 20)  Allergic Rhinitis  (ICD-477.9) 21)  Hypothyroidism  (ICD-244.9) 22)  Hypertension  (ICD-401.9) 23)  Gerd  (ICD-530.81) 24)  Skin Cancer, Hx of  (ICD-V10.83)  Medications Prior to Update: 1)  Folic Acid 1 Mg  Tabs (Folic Acid) .... Once Daily 2)  Cardizem Cd 360 Mg Cp24 (Diltiazem Hcl Coated Beads) .... Once Daily 3)  Citracal + D 250-200 Mg-Unit Tabs (Calcium Citrate-Vitamin D) .... Take Bid 4)  Claritin 10 Mg Tabs (Loratadine) .... Take 1 Tablet By Mouth Once A Day 5)  Coumadin 5 Mg Tabs (Warfarin Sodium) .... 5,5,5,21/2 6)  Cymbalta 60 Mg Cpep (Duloxetine Hcl) .... Once Daily 7)  Diovan 320 Mg  Tabs (Valsartan) .... One A Day 8)  Maxzide-25 37.5-25 Mg Tabs (Triamterene-Hctz) .... Once Daily 9)  Synthroid 100 Mcg Tabs (Levothyroxine Sodium)  .... Once Daily 10)  Zantac 300 Mg Tabs (Ranitidine Hcl) .... Once Daily 11)  Vitamin B-6 Cr 200 Mg  Tbcr (Pyridoxine Hcl) .... Once Daily 12)  Vitamin C 500 Mg  Chew (Ascorbic Acid) .... Once Daily 13)  Stool Softener 100 Mg  Caps (Docusate Sodium) .... Once Daily 14)  Promethegan 25 Mg  Supp (Promethazine Hcl) .... As Needed 15)  Vicodin 5-500 Mg  Tabs (Hydrocodone-Acetaminophen) .... As Needed 16)  Tylenol 325 Mg  Tabs (Acetaminophen) .... As Needed 17)  Toprol Xl 25 Mg Tb24 (Metoprolol Succinate) .... Take One (1) By Mouth Daily 18)  Vitamin D 16109 Unit  Caps (Ergocalciferol) .... One By Mouth Weekly 19)  Phentermine Hcl 37.5 Mg  Tabs (Phentermine Hcl) .... One By Mouth Q Am 20)  Clotrimazole-Betamethasone 1-0.05 % Lotn (Clotrimazole-Betamethasone) .... Apply To Ear Two Times A Day 21)  Hydromet 5-1.5 Mg/74ml Syrp (Hydrocodone-Homatropine) .... One Teaspoon Q 6-8 Hours As Needed Cough 22)  Zithromax Z-Pak 250 Mg Tabs (Azithromycin) .... As Directed  Current Medications (verified): 1)  Folic Acid 1 Mg  Tabs (Folic Acid) .... Once Daily 2)  Cardizem Cd 360 Mg Cp24 (Diltiazem Hcl Coated Beads) .... Once Daily 3)  Citracal + D 250-200 Mg-Unit Tabs (Calcium Citrate-Vitamin D) .... Take Bid 4)  Claritin 10 Mg Tabs (Loratadine) .... Take 1 Tablet By Mouth Once A Day 5)  Coumadin 5 Mg Tabs (Warfarin Sodium) .... 5,5,5,21/2 6)  Cymbalta 60 Mg Cpep (Duloxetine Hcl) .... Once Daily 7)  Diovan 320 Mg  Tabs (Valsartan) .... One A Day 8)  Maxzide-25 37.5-25 Mg Tabs (Triamterene-Hctz) .... Once Daily 9)  Synthroid 100 Mcg Tabs (Levothyroxine Sodium) .... Once Daily 10)  Zantac 300 Mg Tabs (Ranitidine Hcl) .... Once Daily 11)  Vitamin B-6 Cr 200 Mg  Tbcr (Pyridoxine Hcl) .... Once Daily 12)  Vitamin C 500 Mg  Chew (Ascorbic Acid) .... Once Daily 13)  Stool Softener 100 Mg  Caps (Docusate Sodium) .... Once Daily 14)  Promethegan 25 Mg  Supp (Promethazine Hcl) .... As Needed 15)  Vicodin 5-500 Mg   Tabs (Hydrocodone-Acetaminophen) .... As Needed 16)  Tylenol 325 Mg  Tabs (Acetaminophen) .... As Needed 17)  Toprol Xl 25 Mg Tb24 (Metoprolol Succinate) .... Take One (1) By Mouth Daily 18)  Vitamin D 60454 Unit  Caps (Ergocalciferol) .... One By Mouth Weekly 19)  Phentermine Hcl 37.5 Mg  Tabs (Phentermine Hcl) .... One By Mouth Q Am  Allergies (verified): 1)  ! * Contrast Dye 2)  Augmentin (Amoxicillin-Pot Clavulanate)  Past History:  Family History: Last updated: 05/25/2007 father Family History of CAD Female 1st degree relative <50 Family History Diabetes 1st degree relative Family History Hypertension  Social History: Last updated: 05/25/2007 Married Never Smoked  Risk Factors: Smoking Status: never (04/02/2010)  Past medical, surgical, family and social histories (including risk factors) reviewed, and no changes noted (except as noted below).  Past Medical History: Reviewed history from 06/15/2008 and no changes required. chronic anticoagulation dx Skin cancer, hx of/melanoma  GERD Hypertension Hypothyroidism Allergic rhinitis Anticoagulation therapy lupus anticoagulant Low back pain  Past Surgical History: Reviewed history from 05/25/2007 and no changes required. melanoma removal/face/stage3 Cholecystectomy Hysterectomy Lumbar laminectomy steroid injections GYN surgery prior DVT Lumpectomy cysts and lipomas Oophorectomy 1999 arthroscopy left knee  Family History: Reviewed history from 05/25/2007 and no changes required. father Family History of CAD Female 1st degree relative <50 Family History Diabetes 1st degree relative Family History Hypertension  Social History: Reviewed history from 05/25/2007 and no changes required. Married Never Smoked  Review of Systems  The patient denies anorexia, fever, weight loss, weight gain, vision loss, decreased hearing, hoarseness, chest pain, syncope, dyspnea on exertion, peripheral edema, prolonged cough,  headaches, hemoptysis, abdominal pain, melena, hematochezia, severe indigestion/heartburn, hematuria, incontinence, genital sores, muscle weakness, suspicious skin lesions, transient blindness, difficulty walking, depression, unusual weight change, abnormal bleeding, enlarged lymph nodes, angioedema, and breast masses.    Physical Exam  General:  overweight-appearing.  overweight-appearing.   Head:  Normocephalic and atraumatic without obvious abnormalities. No apparent alopecia or balding. Eyes:  pupils equal and pupils round.   Ears:  R ear normal and L ear normal.   Nose:  no external deformity and no nasal discharge.   Neck:  No deformities, masses, or tenderness noted. Lungs:  Normal respiratory effort, chest expands symmetrically. Lungs are clear to auscultation, no crackles or wheezes. Heart:  Normal rate and regular rhythm. S1 and S2 normal without gallop, murmur, click, rub or other extra sounds. Abdomen:  Bowel sounds positive,abdomen soft and non-tender without masses, organomegaly or hernias noted. Msk:  no joint tenderness and no joint swelling.   Extremities:  trace left pedal edema and trace right pedal edema.   Neurologic:  alert & oriented X3.     Impression & Recommendations:  Problem # 1:  OVERWEIGHT (ICD-278.02) the pt is stress eating Ht: 66 (04/02/2010)   Wt: 223 (04/02/2010)   BMI: 36.12 (04/02/2010)  Problem # 2:  DEEP VENOUS THROMBOPHLEBITIS (ICD-453.40) chronic coumadin for hypercoagulable  Problem # 3:  HYPOTHYROIDISM (ICD-244.9)  Her updated medication list for this problem includes:    Synthroid 100 Mcg Tabs (Levothyroxine sodium) ..... Once daily  Labs Reviewed: TSH: 4.95 (04/09/2009)    HgBA1c: 6.5 (08/06/2009) Chol: 151 (04/09/2009)   HDL: 55.20 (04/09/2009)   LDL: 71 (04/09/2009)   TG: 123.0 (04/09/2009)  Orders: TLB-TSH (Thyroid Stimulating Hormone) (84443-TSH) TLB-T3, Free (Triiodothyronine) (84481-T3FREE) TLB-T4 (Thyrox), Free  [phone])  Problem # 4:  TRANSIENT DISORDER INITIATING/MAINTAINING SLEEP (ICD-307.41) add silenor 6 mg trial  Complete Medication List: 1)  Folic Acid 1 Mg Tabs (Folic acid) .... Once daily 2)  Cardizem Cd 360 Mg Cp24 (Diltiazem hcl coated beads) .... Once daily 3)  Citracal + D 250-200 Mg-unit Tabs (Calcium citrate-vitamin d) .... Take bid 4)  Claritin 10 Mg Tabs (Loratadine) .... Take 1 tablet by mouth once a day 5)  Coumadin 5 Mg Tabs (Warfarin sodium) .... 5,5,5,21/2 6)  Cymbalta 60 Mg Cpep (Duloxetine hcl) .... Once daily 7)  Diovan 320 Mg Tabs (Valsartan) .... One a day 8)  Maxzide-25 37.5-25 Mg Tabs (Triamterene-hctz) .... Once daily 9)  Synthroid 100 Mcg Tabs (Levothyroxine sodium) .... Once daily 10)  Zantac 300 Mg Tabs (Ranitidine hcl) .... Once daily 11)  Vitamin B-6 Cr 200 Mg Tbcr (Pyridoxine hcl) .... Once daily 12)  Vitamin C 500 Mg Chew (Ascorbic acid) .... Once daily 13)  Stool Softener 100 Mg Caps (Docusate sodium) .... Once daily 14)  Promethegan 25 Mg Supp (  Promethazine hcl) .... As needed 15)  Vicodin 5-500 Mg Tabs (Hydrocodone-acetaminophen) .... As needed 16)  Tylenol 325 Mg Tabs (Acetaminophen) .... As needed 17)  Toprol Xl 25 Mg Tb24 (Metoprolol succinate) .... Take one (1) by mouth daily 18)  Vitamin D 24401 Unit Caps (Ergocalciferol) .... One by mouth weekly 19)  Phentermine Hcl 37.5 Mg Tabs (Phentermine hcl) .... One by mouth q am 20)  Silenor 6 Mg Tabs (Doxepin hcl) .... One by mouth at bedtime for sleep  Other Orders: Venipuncture (02725) TLB-Cholesterol, HDL (83718-HDL) TLB-Cholesterol, Direct LDL (83721-DIRLDL) TLB-Cholesterol, Total (82465-CHO) TLB-BMP (Basic Metabolic Panel-BMET) (80048-METABOL)  Hypertension Assessment/Plan:      The patient's hypertensive risk group is category B: At least one risk factor (excluding diabetes) with no target organ damage.  Her calculated 10 year risk of coronary heart disease is 6 %.  Today's blood pressure is  126/82.  Her blood pressure goal is < 140/90.  Patient Instructions: 1)  Please schedule a follow-up appointment in 3 months.

## 2010-10-23 NOTE — Assessment & Plan Note (Signed)
Summary: 3 month fup//ccm   Vital Signs:  Patient profile:   55 year old female Height:      66 inches Weight:      220 pounds BMI:     35.64 Temp:     98.1 degrees F oral Pulse rate:   80 / minute Resp:     14 per minute BP sitting:   124 / 80  (left arm)  Vitals Entered By: Willy Eddy, LPN (July 02, 2010 10:33 AM) CC: ROA- C/O HAD EPISODE OF IRREGULAR HEART BEAT ONE NIGHT Is Patient Diabetic? No   Primary Care Nikeshia Keetch:  Stacie Glaze MD  CC:  ROA- C/O HAD EPISODE OF IRREGULAR HEART BEAT ONE NIGHT.  History of Present Illness: The pt noted an episode ( felt fine all day) of fluttering in chest The meter showed irregular rythm this lasted for several hours the next day the meter detected no arrythmia she had a similar episode when we changed her thyroid medications and she has short episodes of flutter she is anticoagulated  increased stress  Preventive Screening-Counseling & Management  Alcohol-Tobacco     Smoking Status: never     Tobacco Counseling: not indicated; no tobacco use  Problems Prior to Update: 1)  Paroxysmal Atrial Fibrillation  (ICD-427.31) 2)  Transient Disorder Initiating/maintaining Sleep  (ICD-307.41) 3)  Hyperlipidemia, Mild  (ICD-272.4) 4)  Seborrhea  (ICD-706.3) 5)  Overweight  (ICD-278.02) 6)  Glossitis  (ICD-529.0) 7)  Benign Migratory Glossitis  (ICD-529.1) 8)  Contact or Exposure To Other Viral Diseases  (ICD-V01.79) 9)  Ankle Injury, Right  (ICD-959.7) 10)  Low Back Pain  (ICD-724.2) 11)  Physical Examination  (ICD-V70.0) 12)  Primary Hypercoagulable State  (ICD-289.81) 13)  Acut Suppratv Otitis Media w/o Spont Rup Eardrum  (ICD-382.00) 14)  Injury Other and Unspecified Unspecified Site  (ICD-959.9) 15)  Sinusitis, Acute Nos  (ICD-461.9) 16)  Osteoporosis Nos  (ICD-733.00) 17)  Family History Diabetes 1st Degree Relative  (ICD-V18.0) 18)  Family History of Cad Female 1st Degree Relative <50  (ICD-V17.3) 19)   Embolism/thrombosis, Deep Vsl Prxml Lwr Extrm  (ICD-453.41) 20)  Encounter For Therapeutic Drug Monitoring  (ICD-V58.83) 21)  Deep Venous Thrombophlebitis  (ICD-453.40) 22)  Anticoagulation Therapy  (ICD-V58.61) 23)  Allergic Rhinitis  (ICD-477.9) 24)  Hypothyroidism  (ICD-244.9) 25)  Hypertension  (ICD-401.9) 26)  Gerd  (ICD-530.81) 27)  Skin Cancer, Hx of  (ICD-V10.83)  Current Problems (verified): 1)  Transient Disorder Initiating/maintaining Sleep  (ICD-307.41) 2)  Hyperlipidemia, Mild  (ICD-272.4) 3)  Seborrhea  (ICD-706.3) 4)  Overweight  (ICD-278.02) 5)  Glossitis  (ICD-529.0) 6)  Benign Migratory Glossitis  (ICD-529.1) 7)  Contact or Exposure To Other Viral Diseases  (ICD-V01.79) 8)  Ankle Injury, Right  (ICD-959.7) 9)  Low Back Pain  (ICD-724.2) 10)  Physical Examination  (ICD-V70.0) 11)  Primary Hypercoagulable State  (ICD-289.81) 12)  Acut Suppratv Otitis Media w/o Spont Rup Eardrum  (ICD-382.00) 13)  Injury Other and Unspecified Unspecified Site  (ICD-959.9) 14)  Sinusitis, Acute Nos  (ICD-461.9) 15)  Osteoporosis Nos  (ICD-733.00) 16)  Family History Diabetes 1st Degree Relative  (ICD-V18.0) 17)  Family History of Cad Female 1st Degree Relative <50  (ICD-V17.3) 18)  Embolism/thrombosis, Deep Vsl Prxml Lwr Extrm  (ICD-453.41) 19)  Encounter For Therapeutic Drug Monitoring  (ICD-V58.83) 20)  Deep Venous Thrombophlebitis  (ICD-453.40) 21)  Anticoagulation Therapy  (ICD-V58.61) 22)  Allergic Rhinitis  (ICD-477.9) 23)  Hypothyroidism  (ICD-244.9) 24)  Hypertension  (ICD-401.9)  25)  Gerd  (ICD-530.81) 26)  Skin Cancer, Hx of  (ICD-V10.83)  Medications Prior to Update: 1)  Folic Acid 1 Mg  Tabs (Folic Acid) .... Once Daily 2)  Cardizem Cd 360 Mg Cp24 (Diltiazem Hcl Coated Beads) .... Once Daily 3)  Citracal + D 250-200 Mg-Unit Tabs (Calcium Citrate-Vitamin D) .... Take Bid 4)  Claritin 10 Mg Tabs (Loratadine) .... Take 1 Tablet By Mouth Once A Day 5)  Coumadin 5 Mg Tabs  (Warfarin Sodium) .... 5,5,5,21/2 6)  Cymbalta 60 Mg Cpep (Duloxetine Hcl) .... Once Daily 7)  Diovan 320 Mg  Tabs (Valsartan) .... One A Day 8)  Maxzide-25 37.5-25 Mg Tabs (Triamterene-Hctz) .... Once Daily 9)  Synthroid 100 Mcg Tabs (Levothyroxine Sodium) .... Once Daily 10)  Zantac 300 Mg Tabs (Ranitidine Hcl) .... Once Daily 11)  Vitamin B-6 Cr 200 Mg  Tbcr (Pyridoxine Hcl) .... Once Daily 12)  Vitamin C 500 Mg  Chew (Ascorbic Acid) .... Once Daily 13)  Stool Softener 100 Mg  Caps (Docusate Sodium) .... Once Daily 14)  Promethegan 25 Mg  Supp (Promethazine Hcl) .... As Needed 15)  Vicodin 5-500 Mg  Tabs (Hydrocodone-Acetaminophen) .... As Needed 16)  Tylenol 325 Mg  Tabs (Acetaminophen) .... As Needed 17)  Toprol Xl 25 Mg Tb24 (Metoprolol Succinate) .... Take One (1) By Mouth Daily 18)  Vitamin D 16109 Unit  Caps (Ergocalciferol) .... One By Mouth Weekly 19)  Phentermine Hcl 37.5 Mg  Tabs (Phentermine Hcl) .... One By Mouth Q Am 20)  Silenor 6 Mg Tabs (Doxepin Hcl) .... One By Mouth At Bedtime For Sleep  Current Medications (verified): 1)  Folic Acid 1 Mg  Tabs (Folic Acid) .... Once Daily 2)  Cardizem Cd 360 Mg Cp24 (Diltiazem Hcl Coated Beads) .... Once Daily 3)  Citracal + D 250-200 Mg-Unit Tabs (Calcium Citrate-Vitamin D) .... Take Bid 4)  Claritin 10 Mg Tabs (Loratadine) .... Take 1 Tablet By Mouth Once A Day 5)  Coumadin 5 Mg Tabs (Warfarin Sodium) .... 5,5,5,21/2 6)  Cymbalta 60 Mg Cpep (Duloxetine Hcl) .... Once Daily 7)  Diovan 320 Mg  Tabs (Valsartan) .... One A Day 8)  Maxzide-25 37.5-25 Mg Tabs (Triamterene-Hctz) .... Once Daily 9)  Synthroid 100 Mcg Tabs (Levothyroxine Sodium) .... Once Daily 10)  Zantac 300 Mg Tabs (Ranitidine Hcl) .... Once Daily 11)  Vitamin B-6 Cr 200 Mg  Tbcr (Pyridoxine Hcl) .... Once Daily 12)  Vitamin C 500 Mg  Chew (Ascorbic Acid) .... Once Daily 13)  Stool Softener 100 Mg  Caps (Docusate Sodium) .... Once Daily 14)  Promethegan 25 Mg  Supp  (Promethazine Hcl) .... As Needed 15)  Vicodin 5-500 Mg  Tabs (Hydrocodone-Acetaminophen) .... As Needed 16)  Tylenol 325 Mg  Tabs (Acetaminophen) .... As Needed 17)  Toprol Xl 25 Mg Tb24 (Metoprolol Succinate) .... Take One (1) By Mouth Daily 18)  Vitamin D 60454 Unit  Caps (Ergocalciferol) .... One By Mouth Weekly 19)  Phentermine Hcl 37.5 Mg  Tabs (Phentermine Hcl) .... One By Mouth Q Am 20)  Silenor 6 Mg Tabs (Doxepin Hcl) .... One By Mouth At Bedtime For Sleep  Allergies (verified): 1)  ! * Contrast Dye 2)  Augmentin (Amoxicillin-Pot Clavulanate)  Past History:  Family History: Last updated: 05/25/2007 father Family History of CAD Female 1st degree relative <50 Family History Diabetes 1st degree relative Family History Hypertension  Social History: Last updated: 05/25/2007 Married Never Smoked  Risk Factors: Smoking Status: never (07/02/2010)  Past  medical, surgical, family and social histories (including risk factors) reviewed, and no changes noted (except as noted below).  Past Medical History: Reviewed history from 06/15/2008 and no changes required. chronic anticoagulation dx Skin cancer, hx of/melanoma GERD Hypertension Hypothyroidism Allergic rhinitis Anticoagulation therapy lupus anticoagulant Low back pain  Past Surgical History: Reviewed history from 05/25/2007 and no changes required. melanoma removal/face/stage3 Cholecystectomy Hysterectomy Lumbar laminectomy steroid injections GYN surgery prior DVT Lumpectomy cysts and lipomas Oophorectomy 1999 arthroscopy left knee  Family History: Reviewed history from 05/25/2007 and no changes required. father Family History of CAD Female 1st degree relative <50 Family History Diabetes 1st degree relative Family History Hypertension  Social History: Reviewed history from 05/25/2007 and no changes required. Married Never Smoked  Review of Systems  The patient denies anorexia, fever, weight loss,  weight gain, vision loss, decreased hearing, hoarseness, chest pain, syncope, dyspnea on exertion, peripheral edema, prolonged cough, headaches, hemoptysis, abdominal pain, melena, hematochezia, severe indigestion/heartburn, hematuria, incontinence, genital sores, muscle weakness, suspicious skin lesions, transient blindness, difficulty walking, depression, unusual weight change, abnormal bleeding, enlarged lymph nodes, angioedema, breast masses, and testicular masses.    Physical Exam  General:  overweight-appearing.  overweight-appearing.   Head:  Normocephalic and atraumatic without obvious abnormalities. No apparent alopecia or balding. Eyes:  pupils equal and pupils round.   Ears:  R ear normal and L ear normal.   Nose:  no external deformity and no nasal discharge.   Mouth:  pharynx pink and moist and no posterior lymphoid hypertrophy.   Neck:  No deformities, masses, or tenderness noted. Lungs:  Normal respiratory effort, chest expands symmetrically. Lungs are clear to auscultation, no crackles or wheezes. Heart:  Normal rate and regular rhythm. S1 and S2 normal without gallop, murmur, click, rub or other extra sounds. Abdomen:  soft.     Impression & Recommendations:  Problem # 1:  PAROXYSMAL ATRIAL FIBRILLATION (ICD-427.31) discussed with the pt event moniter for 30 days and then EP referral under stress with home brother in law living in house  Her updated medication list for this problem includes:    Cardizem Cd 360 Mg Cp24 (Diltiazem hcl coated beads) ..... Once daily    Coumadin 5 Mg Tabs (Warfarin sodium) .Marland Kitchen... 5,5,5,21/2    Toprol Xl 25 Mg Tb24 (Metoprolol succinate) .Marland Kitchen... Take one (1) by mouth daily  Reviewed the following: PT: 20.7 (01/24/2010)   INR: 3.1 (06/17/2010) Next Protime: 4 weeks (dated on 06/17/2010)  Problem # 2:  ANTICOAGULATION THERAPY (ICD-V58.61) monitering  Problem # 3:  HYPOTHYROIDISM (ICD-244.9) stable Her updated medication list for this  problem includes:    Synthroid 100 Mcg Tabs (Levothyroxine sodium) ..... Once daily  Labs Reviewed: TSH: 4.31 (04/02/2010)    HgBA1c: 6.5 (08/06/2009) Chol: 155 (04/02/2010)   HDL: 56.90 (04/02/2010)   LDL: 71 (04/09/2009)   TG: 123.0 (04/09/2009)  Problem # 4:  GERD (ICD-530.81) Assessment: Unchanged  Her updated medication list for this problem includes:    Zantac 300 Mg Tabs (Ranitidine hcl) ..... Once daily  Labs Reviewed: Hgb: 13.4 (04/09/2009)   Hct: 38.5 (04/09/2009)  Complete Medication List: 1)  Folic Acid 1 Mg Tabs (Folic acid) .... Once daily 2)  Cardizem Cd 360 Mg Cp24 (Diltiazem hcl coated beads) .... Once daily 3)  Citracal + D 250-200 Mg-unit Tabs (Calcium citrate-vitamin d) .... Take bid 4)  Claritin 10 Mg Tabs (Loratadine) .... Take 1 tablet by mouth once a day 5)  Coumadin 5 Mg Tabs (Warfarin sodium) .... 5,5,5,21/2 6)  Cymbalta 60 Mg Cpep (Duloxetine hcl) .... Once daily 7)  Diovan 320 Mg Tabs (Valsartan) .... One a day 8)  Maxzide-25 37.5-25 Mg Tabs (Triamterene-hctz) .... Once daily 9)  Synthroid 100 Mcg Tabs (Levothyroxine sodium) .... Once daily 10)  Zantac 300 Mg Tabs (Ranitidine hcl) .... Once daily 11)  Vitamin B-6 Cr 200 Mg Tbcr (Pyridoxine hcl) .... Once daily 12)  Vitamin C 500 Mg Chew (Ascorbic acid) .... Once daily 13)  Stool Softener 100 Mg Caps (Docusate sodium) .... Once daily 14)  Promethegan 25 Mg Supp (Promethazine hcl) .... As needed 15)  Vicodin 5-500 Mg Tabs (Hydrocodone-acetaminophen) .... As needed 16)  Tylenol 325 Mg Tabs (Acetaminophen) .... As needed 17)  Toprol Xl 25 Mg Tb24 (Metoprolol succinate) .... Take one (1) by mouth daily 18)  Vitamin D 16109 Unit Caps (Ergocalciferol) .... One by mouth weekly 19)  Phentermine Hcl 37.5 Mg Tabs (Phentermine hcl) .... One by mouth q am 20)  Silenor 6 Mg Tabs (Doxepin hcl) .... One by mouth at bedtime for sleep  Patient Instructions: 1)  Please schedule a follow-up appointment in 3 months.

## 2010-10-23 NOTE — Progress Notes (Signed)
Summary: diarrhea, abd cramping,fever,vomited yest  Phone Note Call from Patient Call back at [facility] Phone [phone]   Summary of Call: Stomach virus started back Tues night.  T mainly at night.  Was 101 last pm. Today 99.1. Liquid  Diarrhea every time she takes anything, even ginger ale.  Was vomiting, but was.  Using suppository & not voming today.  Taking the white pill for diarrhea, the last one called in.  Stomach hurts, like cramps.  What to do?  [facility]. Initial call taken by: Rudy Jew, RN,  November 01, 2009 9:25 AM  Follow-up for Phone Call        i think she got the flu shot so this iw the "GI" flu recommend by mouth hydration with 4 oz every 15 min of ginger ale or gator aid may call in phenergan 25 by mouth or pr pern nausea number 10 ( may sub for zophran 4 mg if needed) change diet to clear liuids only for 48 hours may use imodium  sparingly main way to treat is bowel rest. Follow-up by: Stacie Glaze MD,  November 01, 2009 9:39 AM  Additional Follow-up for Phone Call Additional follow up Details #1::        Patietn says okay.  Wants the zofran to try. Additional Follow-up by: Rudy Jew, RN,  November 01, 2009 10:05 AM    New/Updated Medications: ZOFRAN 4 MG TABS (ONDANSETRON HCL) One by mouth every 6-8 hrs as needed nausea Prescriptions: ZOFRAN 4 MG TABS (ONDANSETRON HCL) One by mouth every 6-8 hrs as needed nausea  #10 x 0   Entered by:   Rudy Jew, RN   Authorized by:   Stacie Glaze MD   Signed by:   Rudy Jew, RN on 11/01/2009   Method used:   Electronically to        [facility]  [phone]* (retail)       [address]       [place], [place]  [address]       Ph: [phone] or [phone]       Fax: [phone]   RxID:   [id number]

## 2010-10-23 NOTE — Assessment & Plan Note (Signed)
Summary: pt//ccm   Nurse Visit   Allergies: 1)  ! * Contrast Dye 2)  Augmentin (Amoxicillin-Pot Clavulanate) Laboratory Results   Blood Tests     PT: 20.7 s   (Normal Range: 10.6-13.4)  INR: 2.9   (Normal Range: 0.88-1.12   Therap INR: 2.0-3.5) Comments: [name]  Jan 24, 2010 2:10 PM     Orders Added: 1)  Est. Patient Level I [99211] 2)  Protime [[id number]]   ANTICOAGULATION RECORD PREVIOUS REGIMEN & LAB RESULTS Anticoagulation Diagnosis:  v58.83,v58.61,453.41 on  05/19/2007 Previous INR Goal Range:  2.5-3.5 on  05/19/2007 Previous INR:  1.8 on  12/27/2009 Previous Coumadin Dose(mg):  2.5mg ,5mg ,5mg  altinate on  09/10/2008 Previous Regimen:  same on  11/29/2009 Previous Coagulation Comments:  Missed 3 doses  on  12/27/2009  NEW REGIMEN & LAB RESULTS Current INR: 2.9 Regimen: same  Repeat testing in: 1 month  Anticoagulation Visit Questionnaire Coumadin dose missed/changed:  No Abnormal Bleeding Symptoms:  No  Any diet changes including alcohol intake, vegetables or greens since the last visit:  No Any illnesses or hospitalizations since the last visit:  No Any signs of clotting since the last visit (including chest discomfort, dizziness, shortness of breath, arm tingling, slurred speech, swelling or redness in leg):  No  MEDICATIONS FOLIC ACID 1 MG  TABS (FOLIC ACID) once daily [BMN] CARDIZEM CD 360 MG CP24 (DILTIAZEM HCL COATED BEADS) once daily CITRACAL + D 250-200 MG-UNIT TABS (CALCIUM CITRATE-VITAMIN D) Take bid CLARITIN 10 MG TABS (LORATADINE) Take 1 tablet by mouth once a day COUMADIN 5 MG TABS (WARFARIN SODIUM) 5,5,5,21/2 [BMN] CYMBALTA 60 MG CPEP (DULOXETINE HCL) once daily DIOVAN 320 MG  TABS (VALSARTAN) one a day MAXZIDE-25 37.5-25 MG TABS (TRIAMTERENE-HCTZ) once daily [BMN] SYNTHROID 100 MCG TABS (LEVOTHYROXINE SODIUM) once daily ZANTAC 300 MG TABS (RANITIDINE HCL) once daily VITAMIN B-6 CR 200 MG  TBCR (PYRIDOXINE HCL) once daily VITAMIN C  500 MG  CHEW (ASCORBIC ACID) once daily STOOL SOFTENER 100 MG  CAPS (DOCUSATE SODIUM) once daily PROMETHEGAN 25 MG  SUPP (PROMETHAZINE HCL) as needed VICODIN 5-500 MG  TABS (HYDROCODONE-ACETAMINOPHEN) as needed TYLENOL 325 MG  TABS (ACETAMINOPHEN) as needed TOPROL XL 25 MG TB24 (METOPROLOL SUCCINATE) Take one (1) by mouth daily VITAMIN D 45409 UNIT  CAPS (ERGOCALCIFEROL) one by mouth weekly PHENTERMINE HCL 37.5 MG  TABS (PHENTERMINE HCL) one by mouth q AM CLOTRIMAZOLE-BETAMETHASONE 1-0.05 % LOTN (CLOTRIMAZOLE-BETAMETHASONE) apply to ear two times a day

## 2010-10-23 NOTE — Assessment & Plan Note (Signed)
Summary: pt/njr/pt rescd//ccm   Nurse Visit   Allergies: 1)  ! * Contrast Dye 2)  Augmentin (Amoxicillin-Pot Clavulanate) Laboratory Results   Blood Tests   Date/Time Received: December 27, 2009 2:30 PM  Date/Time Reported: December 27, 2009 2:30 PM   PT: 16.5 s   (Normal Range: 10.6-13.4)  INR: 1.8   (Normal Range: 0.88-1.12   Therap INR: 2.0-3.5) Comments: Wynona Canes, CMA  December 27, 2009 2:30 PM     Orders Added: 1)  Est. Patient Level I [32951] 2)  Protime [[id number]]  Laboratory Results   Blood Tests     PT: 16.5 s   (Normal Range: 10.6-13.4)  INR: 1.8   (Normal Range: 0.88-1.12   Therap INR: 2.0-3.5) Comments: Wynona Canes, CMA  December 27, 2009 2:30 PM       ANTICOAGULATION RECORD PREVIOUS REGIMEN & LAB RESULTS Anticoagulation Diagnosis:  v58.83,v58.61,453.41 on  05/19/2007 Previous INR Goal Range:  2.5-3.5 on  05/19/2007 Previous INR:  2.7 on  11/29/2009 Previous Coumadin Dose(mg):  2.5mg ,5mg ,5mg  altinate on  09/10/2008 Previous Regimen:  same on  11/29/2009 Previous Coagulation Comments:  hold for 1 days by Dr. Tawanna Cooler on  08/26/2007  NEW REGIMEN & LAB RESULTS Current INR: 1.8 Regimen: same  (no change) Coagulation Comments: Missed 3 doses       Repeat testing in: 4 weeks MEDICATIONS FOLIC ACID 1 MG  TABS (FOLIC ACID) once daily [BMN] CARDIZEM CD 360 MG CP24 (DILTIAZEM HCL COATED BEADS) once daily CITRACAL + D 250-200 MG-UNIT TABS (CALCIUM CITRATE-VITAMIN D) Take bid CLARITIN 10 MG TABS (LORATADINE) Take 1 tablet by mouth once a day COUMADIN 5 MG TABS (WARFARIN SODIUM) 5,5,5,21/2 [BMN] CYMBALTA 60 MG CPEP (DULOXETINE HCL) once daily DIOVAN 320 MG  TABS (VALSARTAN) one a day MAXZIDE-25 37.5-25 MG TABS (TRIAMTERENE-HCTZ) once daily [BMN] SYNTHROID 100 MCG TABS (LEVOTHYROXINE SODIUM) once daily ZANTAC 300 MG TABS (RANITIDINE HCL) once daily VITAMIN B-6 CR 200 MG  TBCR (PYRIDOXINE HCL) once daily VITAMIN C 500 MG  CHEW (ASCORBIC ACID) once  daily STOOL SOFTENER 100 MG  CAPS (DOCUSATE SODIUM) once daily PROMETHEGAN 25 MG  SUPP (PROMETHAZINE HCL) as needed VICODIN 5-500 MG  TABS (HYDROCODONE-ACETAMINOPHEN) as needed TYLENOL 325 MG  TABS (ACETAMINOPHEN) as needed TOPROL XL 25 MG TB24 (METOPROLOL SUCCINATE) Take one (1) by mouth daily VITAMIN D 63016 UNIT  CAPS (ERGOCALCIFEROL) one by mouth weekly PHENTERMINE HCL 37.5 MG  TABS (PHENTERMINE HCL) one by mouth q AM CLOTRIMAZOLE-BETAMETHASONE 1-0.05 % LOTN (CLOTRIMAZOLE-BETAMETHASONE) apply to ear two times a day   Anticoagulation Visit Questionnaire      Coumadin dose missed/changed:  No      Coumadin Dose Comments:  one or more missed dose(s)      Abnormal Bleeding Symptoms:  No   Any diet changes including alcohol intake, vegetables or greens since the last visit:  No Any illnesses or hospitalizations since the last visit:  Yes      Recent Illness/Hospitalizations:  Dentist procedure Any signs of clotting since the last visit (including chest discomfort, dizziness, shortness of breath, arm tingling, slurred speech, swelling or redness in leg):  No

## 2010-10-23 NOTE — Letter (Signed)
Summary: [facility]  [facility]   Imported By: Maryln Gottron 12/30/2009 09:17:20  _____________________________________________________________________  External Attachment:    Type:   Image     Comment:   External Document

## 2010-10-23 NOTE — Letter (Signed)
Summary: [facility]  [facility]   Imported By: Maryln Gottron 10/16/2009 12:33:45  _____________________________________________________________________  External Attachment:    Type:   Image     Comment:   External Document

## 2010-10-23 NOTE — Assessment & Plan Note (Signed)
Summary: PT/vitamin d level/RCD   Nurse Visit   Allergies: 1)  ! * Contrast Dye 2)  Augmentin (Amoxicillin-Pot Clavulanate) Laboratory Results   Blood Tests      INR: 2.6   (Normal Range: 0.88-1.12   Therap INR: 2.0-3.5) Comments: [name]  April 22, 2010 2:15 PM     Orders Added: 1)  Est. Patient Level I [99211] 2)  Protime [[id number]] 3)  T-Vitamin D (25-Hydroxy) [phone] 4)  Specimen Handling [99000]   ANTICOAGULATION RECORD PREVIOUS REGIMEN & LAB RESULTS Anticoagulation Diagnosis:  v58.83,v58.61,453.41 on  05/19/2007 Previous INR Goal Range:  2.5-3.5 on  05/19/2007 Previous INR:  2.8 on  03/20/2010 Previous Coumadin Dose(mg):  2.5mg ,5mg ,5mg  altinate on  09/10/2008 Previous Regimen:  same on  02/21/2010 Previous Coagulation Comments:  Missed 3 doses  on  12/27/2009  NEW REGIMEN & LAB RESULTS Current INR: 2.6 Regimen: same  Repeat testing in: 4 weeks  Anticoagulation Visit Questionnaire Coumadin dose missed/changed:  No Abnormal Bleeding Symptoms:  No  Any diet changes including alcohol intake, vegetables or greens since the last visit:  No Any illnesses or hospitalizations since the last visit:  No Any signs of clotting since the last visit (including chest discomfort, dizziness, shortness of breath, arm tingling, slurred speech, swelling or redness in leg):  No  MEDICATIONS FOLIC ACID 1 MG  TABS (FOLIC ACID) once daily CARDIZEM CD 360 MG CP24 (DILTIAZEM HCL COATED BEADS) once daily CITRACAL + D 250-200 MG-UNIT TABS (CALCIUM CITRATE-VITAMIN D) Take bid CLARITIN 10 MG TABS (LORATADINE) Take 1 tablet by mouth once a day COUMADIN 5 MG TABS (WARFARIN SODIUM) 5,5,5,21/2 [BMN] CYMBALTA 60 MG CPEP (DULOXETINE HCL) once daily DIOVAN 320 MG  TABS (VALSARTAN) one a day MAXZIDE-25 37.5-25 MG TABS (TRIAMTERENE-HCTZ) once daily [BMN] SYNTHROID 100 MCG TABS (LEVOTHYROXINE SODIUM) once daily ZANTAC 300 MG TABS (RANITIDINE HCL) once daily VITAMIN B-6 CR 200  MG  TBCR (PYRIDOXINE HCL) once daily VITAMIN C 500 MG  CHEW (ASCORBIC ACID) once daily STOOL SOFTENER 100 MG  CAPS (DOCUSATE SODIUM) once daily PROMETHEGAN 25 MG  SUPP (PROMETHAZINE HCL) as needed VICODIN 5-500 MG  TABS (HYDROCODONE-ACETAMINOPHEN) as needed TYLENOL 325 MG  TABS (ACETAMINOPHEN) as needed TOPROL XL 25 MG TB24 (METOPROLOL SUCCINATE) Take one (1) by mouth daily VITAMIN D 74259 UNIT  CAPS (ERGOCALCIFEROL) one by mouth weekly PHENTERMINE HCL 37.5 MG  TABS (PHENTERMINE HCL) one by mouth q AM SILENOR 6 MG TABS (DOXEPIN HCL) one by mouth at bedtime for sleep

## 2010-10-23 NOTE — Progress Notes (Signed)
Summary: REQ FOR MED  Phone Note Call from Patient   Caller: Patient [phone] Reason for Call: Talk to Nurse, Talk to Doctor, Lab or Test Results Summary of Call: Pt called to req that a med for sxs of N / V / D x 24 hrs...Marland KitchenMarland Kitchen Pt req that a med (suppository?) be sent in to [facility] [address]...Marland KitchenMarland KitchenPt states that she can't come in for OV because her husband is going chemo and is unable to drive her to the office.  Pt can be reached at [phone] with any questions or concerns.  Initial call taken by: Debbra Riding,  October 22, 2009 11:52 AM  Follow-up for Phone Call        pt informed Follow-up by: Willy Eddy, LPN,  October 22, 2009 11:55 AM    Prescriptions: PROMETHEGAN 25 MG  SUPP (PROMETHAZINE HCL) as needed  #10 x 3   Entered by:   Willy Eddy, LPN   Authorized by:   Stacie Glaze MD   Signed by:   Willy Eddy, LPN on 65/78/4696   Method used:   Electronically to        [facility]  [phone]* (retail)       [address]       [place], [place]  [address]       Ph: [phone] or [phone]       Fax: [phone]   RxID:   [id number]

## 2010-10-23 NOTE — Assessment & Plan Note (Signed)
Summary: 3 month rov/njr   Vital Signs:  Patient profile:   55 year old female Height:      66 inches Weight:      216 pounds BMI:     34.99 Temp:     98.2 degrees F oral Pulse rate:   88 / minute Resp:     14 per minute BP sitting:   144 / 80  (left arm)  Vitals Entered By: Willy Eddy, LPN (September 29, 2010 9:31 AM) CC: roa- had another episode of heart palpatations- pt had previously been on 2 rounds of prednisone, Hypertension Management Is Patient Diabetic? No   Primary Care Provider:  Stacie Glaze MD  CC:  roa- had another episode of heart palpatations- pt had previously been on 2 rounds of prednisone and Hypertension Management.  History of Present Illness: allergic reaction with rash that required two bursts and tapers of prednisone the pt reports another episode of palpitation lasting off an on all day last Thursday... she can tell when the rate becomes iregular and the blood pressure increases with the rate. The rate appears to be irregular on the blood pressure machine Stop the phenterimine. reactive vs paraxysmal Was on prednisone bursts and tapers moniter labs ( thyroid) and order holter and echo marked increased stress  Hypertension History:      She denies headache, chest pain, palpitations, dyspnea with exertion, orthopnea, PND, peripheral edema, visual symptoms, neurologic problems, syncope, and side effects from treatment.        Positive major cardiovascular risk factors include female age 37 years old or older, hyperlipidemia, and hypertension.  Negative major cardiovascular risk factors include non-tobacco-user status.     Preventive Screening-Counseling & Management  Alcohol-Tobacco     Smoking Status: never     Tobacco Counseling: not indicated; no tobacco use  Current Problems (verified): 1)  Paroxysmal Atrial Fibrillation  (ICD-427.31) 2)  Transient Disorder Initiating/maintaining Sleep  (ICD-307.41) 3)  Hyperlipidemia, Mild   (ICD-272.4) 4)  Seborrhea  (ICD-706.3) 5)  Overweight  (ICD-278.02) 6)  Glossitis  (ICD-529.0) 7)  Benign Migratory Glossitis  (ICD-529.1) 8)  Contact or Exposure To Other Viral Diseases  (ICD-V01.79) 9)  Ankle Injury, Right  (ICD-959.7) 10)  Low Back Pain  (ICD-724.2) 11)  Physical Examination  (ICD-V70.0) 12)  Primary Hypercoagulable State  (ICD-289.81) 13)  Acut Suppratv Otitis Media w/o Spont Rup Eardrum  (ICD-382.00) 14)  Injury Other and Unspecified Unspecified Site  (ICD-959.9) 15)  Sinusitis, Acute Nos  (ICD-461.9) 16)  Osteoporosis Nos  (ICD-733.00) 17)  Family History Diabetes 1st Degree Relative  (ICD-V18.0) 18)  Family History of Cad Female 1st Degree Relative <50  (ICD-V17.3) 19)  Embolism/thrombosis, Deep Vsl Prxml Lwr Extrm  (ICD-453.41) 20)  Encounter For Therapeutic Drug Monitoring  (ICD-V58.83) 21)  Deep Venous Thrombophlebitis  (ICD-453.40) 22)  Anticoagulation Therapy  (ICD-V58.61) 23)  Allergic Rhinitis  (ICD-477.9) 24)  Hypothyroidism  (ICD-244.9) 25)  Hypertension  (ICD-401.9) 26)  Gerd  (ICD-530.81) 27)  Skin Cancer, Hx of  (ICD-V10.83)  Current Medications (verified): 1)  Folic Acid 1 Mg  Tabs (Folic Acid) .... Once Daily 2)  Cardizem Cd 360 Mg Cp24 (Diltiazem Hcl Coated Beads) .... Once Daily 3)  Citracal + D 250-200 Mg-Unit Tabs (Calcium Citrate-Vitamin D) .... Take Bid 4)  Claritin 10 Mg Tabs (Loratadine) .... Take 1 Tablet By Mouth Once A Day 5)  Coumadin 5 Mg Tabs (Warfarin Sodium) .... 5,5,5,21/2 6)  Cymbalta 60 Mg Cpep (Duloxetine Hcl) .Marland KitchenMarland KitchenMarland Kitchen  Once Daily 7)  Diovan 320 Mg  Tabs (Valsartan) .... One A Day 8)  Maxzide-25 37.5-25 Mg Tabs (Triamterene-Hctz) .... Once Daily 9)  Synthroid 100 Mcg Tabs (Levothyroxine Sodium) .... Once Daily 10)  Zantac 300 Mg Tabs (Ranitidine Hcl) .... Once Daily 11)  Vitamin B-6 Cr 200 Mg  Tbcr (Pyridoxine Hcl) .... Once Daily 12)  Vitamin C 500 Mg  Chew (Ascorbic Acid) .... Once Daily 13)  Stool Softener 100 Mg  Caps  (Docusate Sodium) .... Once Daily 14)  Promethegan 25 Mg  Supp (Promethazine Hcl) .... As Needed 15)  Vicodin 5-500 Mg  Tabs (Hydrocodone-Acetaminophen) .... As Needed 16)  Tylenol 325 Mg  Tabs (Acetaminophen) .... As Needed 17)  Toprol Xl 25 Mg Tb24 (Metoprolol Succinate) .... Take One (1) By Mouth Daily 18)  Vitamin D 72536 Unit  Caps (Ergocalciferol) .... One By Mouth Weekly 19)  Phentermine Hcl 37.5 Mg  Tabs (Phentermine Hcl) .... One By Mouth Q Am 20)  Silenor 6 Mg Tabs (Doxepin Hcl) .... One By Mouth At Bedtime For Sleep  Allergies (verified): 1)  ! * Contrast Dye 2)  Augmentin (Amoxicillin-Pot Clavulanate)  Past History:  Family History: Last updated: 05/25/2007 father Family History of CAD Female 1st degree relative <50 Family History Diabetes 1st degree relative Family History Hypertension  Social History: Last updated: 05/25/2007 Married Never Smoked  Risk Factors: Smoking Status: never (09/29/2010)  Past medical, surgical, family and social histories (including risk factors) reviewed, and no changes noted (except as noted below).  Past Medical History: Reviewed history from 06/15/2008 and no changes required. chronic anticoagulation dx Skin cancer, hx of/melanoma GERD Hypertension Hypothyroidism Allergic rhinitis Anticoagulation therapy lupus anticoagulant Low back pain  Past Surgical History: Reviewed history from 05/25/2007 and no changes required. melanoma removal/face/stage3 Cholecystectomy Hysterectomy Lumbar laminectomy steroid injections GYN surgery prior DVT Lumpectomy cysts and lipomas Oophorectomy 1999 arthroscopy left knee  Family History: Reviewed history from 05/25/2007 and no changes required. father Family History of CAD Female 1st degree relative <50 Family History Diabetes 1st degree relative Family History Hypertension  Social History: Reviewed history from 05/25/2007 and no changes required. Married Never Smoked  Review of  Systems  The patient denies anorexia, fever, weight loss, weight gain, vision loss, decreased hearing, hoarseness, chest pain, syncope, dyspnea on exertion, peripheral edema, prolonged cough, headaches, hemoptysis, abdominal pain, melena, hematochezia, severe indigestion/heartburn, hematuria, incontinence, genital sores, muscle weakness, suspicious skin lesions, transient blindness, difficulty walking, depression, unusual weight change, abnormal bleeding, enlarged lymph nodes, angioedema, and breast masses.    Physical Exam  General:  overweight-appearing.  overweight-appearing.   Head:  Normocephalic and atraumatic without obvious abnormalities. No apparent alopecia or balding. Eyes:  pupils equal and pupils round.   Ears:  R ear normal and L ear normal.   Nose:  no external deformity and no nasal discharge.   Mouth:  pharynx pink and moist and no posterior lymphoid hypertrophy.   Neck:  No deformities, masses, or tenderness noted. Lungs:  Normal respiratory effort, chest expands symmetrically. Lungs are clear to auscultation, no crackles or wheezes. Heart:  Normal rate and regular rhythm. S1 and S2 normal without gallop, murmur, click, rub or other extra sounds. Abdomen:  soft.   Msk:  no joint tenderness and no joint swelling.   Pulses:  R and L carotid,radial,femoral,dorsalis pedis and posterior tibial pulses are full and equal bilaterally Extremities:  trace left pedal edema and trace right pedal edema.   Neurologic:  alert & oriented X3.  Impression & Recommendations:  Problem # 1:  PAROXYSMAL ATRIAL FIBRILLATION (ICD-427.31) Assessment Deteriorated  ? diagnosis hx of work up with no diagnosis in past ( normal holter and echo) repeat echo and labs  Her updated medication list for this problem includes:    Cardizem Cd 360 Mg Cp24 (Diltiazem hcl coated beads) ..... Once daily    Coumadin 5 Mg Tabs (Warfarin sodium) .Marland Kitchen... 5,5,5,21/2    Toprol Xl 25 Mg Tb24 (Metoprolol  succinate) .Marland Kitchen... Take one (1) by mouth daily  Reviewed the following: PT: 20.7 (01/24/2010)   INR: 2.2 (09/08/2010) Next Protime: 4 weeks (dated on 09/08/2010)  Orders: Echo Referral (Echo) TLB-TSH (Thyroid Stimulating Hormone) (84443-TSH) TLB-T3, Free (Triiodothyronine) (84481-T3FREE) TLB-T4 (Thyrox), Free [phone])  Problem # 2:  HYPOTHYROIDISM (ICD-244.9) Assessment: Unchanged possible relationship with the palpitations Her updated medication list for this problem includes:    Synthroid 100 Mcg Tabs (Levothyroxine sodium) ..... Once daily  Labs Reviewed: TSH: 4.31 (04/02/2010)    HgBA1c: 6.5 (08/06/2009) Chol: 155 (04/02/2010)   HDL: 56.90 (04/02/2010)   LDL: 71 (04/09/2009)   TG: 123.0 (04/09/2009)  Problem # 3:  GERD (ICD-530.81)  Her updated medication list for this problem includes:    Zantac 300 Mg Tabs (Ranitidine hcl) ..... Once daily  Labs Reviewed: Hgb: 13.4 (04/09/2009)   Hct: 38.5 (04/09/2009)  Problem # 4:  TRANSIENT DISORDER INITIATING/MAINTAINING SLEEP (ICD-307.41) stress in the home due to brother in law  Complete Medication List: 1)  Folic Acid 1 Mg Tabs (Folic acid) .... Once daily 2)  Cardizem Cd 360 Mg Cp24 (Diltiazem hcl coated beads) .... Once daily 3)  Citracal + D 250-200 Mg-unit Tabs (Calcium citrate-vitamin d) .... Take bid 4)  Claritin 10 Mg Tabs (Loratadine) .... Take 1 tablet by mouth once a day 5)  Coumadin 5 Mg Tabs (Warfarin sodium) .... 5,5,5,21/2 6)  Cymbalta 60 Mg Cpep (Duloxetine hcl) .... Once daily 7)  Diovan 320 Mg Tabs (Valsartan) .... One a day 8)  Maxzide-25 37.5-25 Mg Tabs (Triamterene-hctz) .... Once daily 9)  Synthroid 100 Mcg Tabs (Levothyroxine sodium) .... Once daily 10)  Zantac 300 Mg Tabs (Ranitidine hcl) .... Once daily 11)  Vitamin B-6 Cr 200 Mg Tbcr (Pyridoxine hcl) .... Once daily 12)  Vitamin C 500 Mg Chew (Ascorbic acid) .... Once daily 13)  Stool Softener 100 Mg Caps (Docusate sodium) .... Once daily 14)   Promethegan 25 Mg Supp (Promethazine hcl) .... As needed 15)  Vicodin 5-500 Mg Tabs (Hydrocodone-acetaminophen) .... As needed 16)  Tylenol 325 Mg Tabs (Acetaminophen) .... As needed 17)  Toprol Xl 25 Mg Tb24 (Metoprolol succinate) .... Take one (1) by mouth daily 18)  Vitamin D 47829 Unit Caps (Ergocalciferol) .... One by mouth weekly 19)  Phentermine Hcl 37.5 Mg Tabs (Phentermine hcl) .... One by mouth q am 20)  Silenor 6 Mg Tabs (Doxepin hcl) .... One by mouth at bedtime for sleep  Hypertension Assessment/Plan:      The patient's hypertensive risk group is category B: At least one risk factor (excluding diabetes) with no target organ damage.  Her calculated 10 year risk of coronary heart disease is 8 %.  Today's blood pressure is 144/80.  Her blood pressure goal is < 140/90.  Patient Instructions: 1)  Please schedule a follow-up appointment in 2 months.   Orders Added: 1)  Echo Referral [Echo] 2)  TLB-TSH (Thyroid Stimulating Hormone) [84443-TSH] 3)  TLB-T3, Free (Triiodothyronine) [56213-Y8MVHQ] 4)  TLB-T4 (Thyrox), Free [46962-XB2W] 5)  Est. Patient  Level IV [04540]  Appended Document: Orders Update     Clinical Lists Changes  Orders: Added new Service order of Specimen Handling ([id number]) - Signed      Appended Document: 3 month rov/njr   ANTICOAGULATION RECORD PREVIOUS REGIMEN & LAB RESULTS Anticoagulation Diagnosis:  v58.83,v58.61,453.41 on  05/19/2007 Previous INR Goal Range:  2.5-3.5 on  05/19/2007 Previous INR:  2.2 on  09/08/2010 Previous Coumadin Dose(mg):  2.5mg ,5mg ,5mg  altinate on  09/10/2008 Previous Regimen:  same on  09/08/2010 Previous Coagulation Comments:  Missed 3 doses  on  12/27/2009  NEW REGIMEN & LAB RESULTS Current INR: 2.8 Regimen: same  Repeat testing in: 4 weeks  Anticoagulation Visit Questionnaire Coumadin dose missed/changed:  No Abnormal Bleeding Symptoms:  No  Any diet changes including alcohol intake, vegetables or greens since  the last visit:  No Any illnesses or hospitalizations since the last visit:  No Any signs of clotting since the last visit (including chest discomfort, dizziness, shortness of breath, arm tingling, slurred speech, swelling or redness in leg):  Yes  MEDICATIONS FOLIC ACID 1 MG  TABS (FOLIC ACID) once daily CARDIZEM CD 360 MG CP24 (DILTIAZEM HCL COATED BEADS) once daily CITRACAL + D 250-200 MG-UNIT TABS (CALCIUM CITRATE-VITAMIN D) Take bid CLARITIN 10 MG TABS (LORATADINE) Take 1 tablet by mouth once a day COUMADIN 5 MG TABS (WARFARIN SODIUM) 5,5,5,21/2 [BMN] CYMBALTA 60 MG CPEP (DULOXETINE HCL) once daily DIOVAN 320 MG  TABS (VALSARTAN) one a day MAXZIDE-25 37.5-25 MG TABS (TRIAMTERENE-HCTZ) once daily [BMN] SYNTHROID 100 MCG TABS (LEVOTHYROXINE SODIUM) once daily ZANTAC 300 MG TABS (RANITIDINE HCL) once daily VITAMIN B-6 CR 200 MG  TBCR (PYRIDOXINE HCL) once daily VITAMIN C 500 MG  CHEW (ASCORBIC ACID) once daily STOOL SOFTENER 100 MG  CAPS (DOCUSATE SODIUM) once daily PROMETHEGAN 25 MG  SUPP (PROMETHAZINE HCL) as needed VICODIN 5-500 MG  TABS (HYDROCODONE-ACETAMINOPHEN) as needed TYLENOL 325 MG  TABS (ACETAMINOPHEN) as needed TOPROL XL 25 MG TB24 (METOPROLOL SUCCINATE) Take one (1) by mouth daily VITAMIN D 14782 UNIT  CAPS (ERGOCALCIFEROL) one by mouth weekly PHENTERMINE HCL 37.5 MG  TABS (PHENTERMINE HCL) one by mouth q AM SILENOR 6 MG TABS (DOXEPIN HCL) one by mouth at bedtime for sleep    Laboratory Results   Blood Tests      INR: 2.8   (Normal Range: 0.88-1.12   Therap INR: 2.0-3.5) Comments: [name]  September 29, 2010 11:12 AM

## 2010-10-23 NOTE — Assessment & Plan Note (Signed)
Summary: pt/njr   Nurse Visit   Allergies: 1)  ! * Contrast Dye 2)  Augmentin (Amoxicillin-Pot Clavulanate) Laboratory Results   Blood Tests      INR: 2.8   (Normal Range: 0.88-1.12   Therap INR: 2.0-3.5) Comments: [name]  May 20, 2010 1:51 PM     Orders Added: 1)  Est. Patient Level I [99211] 2)  Protime [[id number]]   ANTICOAGULATION RECORD PREVIOUS REGIMEN & LAB RESULTS Anticoagulation Diagnosis:  v58.83,v58.61,453.41 on  05/19/2007 Previous INR Goal Range:  2.5-3.5 on  05/19/2007 Previous INR:  2.6 on  04/22/2010 Previous Coumadin Dose(mg):  2.5mg ,5mg ,5mg  altinate on  09/10/2008 Previous Regimen:  same on  04/22/2010 Previous Coagulation Comments:  Missed 3 doses  on  12/27/2009  NEW REGIMEN & LAB RESULTS Current INR: 2.8 Regimen: same  Repeat testing in: 4 weeks  Anticoagulation Visit Questionnaire Coumadin dose missed/changed:  No Abnormal Bleeding Symptoms:  No  Any diet changes including alcohol intake, vegetables or greens since the last visit:  No Any illnesses or hospitalizations since the last visit:  No Any signs of clotting since the last visit (including chest discomfort, dizziness, shortness of breath, arm tingling, slurred speech, swelling or redness in leg):  No  MEDICATIONS FOLIC ACID 1 MG  TABS (FOLIC ACID) once daily CARDIZEM CD 360 MG CP24 (DILTIAZEM HCL COATED BEADS) once daily CITRACAL + D 250-200 MG-UNIT TABS (CALCIUM CITRATE-VITAMIN D) Take bid CLARITIN 10 MG TABS (LORATADINE) Take 1 tablet by mouth once a day COUMADIN 5 MG TABS (WARFARIN SODIUM) 5,5,5,21/2 [BMN] CYMBALTA 60 MG CPEP (DULOXETINE HCL) once daily DIOVAN 320 MG  TABS (VALSARTAN) one a day MAXZIDE-25 37.5-25 MG TABS (TRIAMTERENE-HCTZ) once daily [BMN] SYNTHROID 100 MCG TABS (LEVOTHYROXINE SODIUM) once daily ZANTAC 300 MG TABS (RANITIDINE HCL) once daily VITAMIN B-6 CR 200 MG  TBCR (PYRIDOXINE HCL) once daily VITAMIN C 500 MG  CHEW (ASCORBIC ACID) once  daily STOOL SOFTENER 100 MG  CAPS (DOCUSATE SODIUM) once daily PROMETHEGAN 25 MG  SUPP (PROMETHAZINE HCL) as needed VICODIN 5-500 MG  TABS (HYDROCODONE-ACETAMINOPHEN) as needed TYLENOL 325 MG  TABS (ACETAMINOPHEN) as needed TOPROL XL 25 MG TB24 (METOPROLOL SUCCINATE) Take one (1) by mouth daily VITAMIN D 78295 UNIT  CAPS (ERGOCALCIFEROL) one by mouth weekly PHENTERMINE HCL 37.5 MG  TABS (PHENTERMINE HCL) one by mouth q AM SILENOR 6 MG TABS (DOXEPIN HCL) one by mouth at bedtime for sleep

## 2010-10-29 ENCOUNTER — Other Ambulatory Visit (INDEPENDENT_AMBULATORY_CARE_PROVIDER_SITE_OTHER): Payer: 59 | Admitting: Internal Medicine

## 2010-10-29 DIAGNOSIS — D6859 Other primary thrombophilia: Secondary | ICD-10-CM

## 2010-10-29 LAB — POCT INR: INR: 2.3

## 2010-10-29 NOTE — Progress Notes (Signed)
Summary: Pt says Dr. Lenis Noon will be contacting Dr. Lovell Sheehan re: labs  Phone Note Call from Patient Call back at Home Phone [phone]   Caller: Patient Summary of Call: Pt called and said that Dr. Lenis Noon is suppose to be contacted Dr. Lovell Sheehan re: some abnormal and elevated labs. Pt has ov sch for 11/28/10 and needs to get an ov in early feb. Pt has labs sch on 10/29/10.  Pls advise.  Initial call taken by: Lucy Antigua,  October 17, 2010 4:05 PM

## 2010-11-04 ENCOUNTER — Other Ambulatory Visit: Payer: Self-pay | Admitting: Internal Medicine

## 2010-11-05 NOTE — Patient Instructions (Signed)
°  Latest dosing instructions   Total Glynis Smiles Tue Wed Thu Fri Sat   30 5 mg 5 mg 2.5 mg 5 mg 5 mg 2.5 mg 5 mg    (5 mg1) (5 mg1) (5 mg0.5) (5 mg1) (5 mg1) (5 mg0.5) (5 mg1)

## 2010-11-06 NOTE — Letter (Signed)
Summary: [facility]  [facility]   Imported By: Lennie Odor 10/31/2010 16:18:01  _____________________________________________________________________  External Attachment:    Type:   Image     Comment:   External Document

## 2010-11-20 ENCOUNTER — Telehealth: Payer: Self-pay | Admitting: *Deleted

## 2010-11-20 DIAGNOSIS — J069 Acute upper respiratory infection, unspecified: Secondary | ICD-10-CM

## 2010-11-20 MED ORDER — AZITHROMYCIN 250 MG PO TABS: 250.0000 mg | ORAL_TABLET | ORAL | Status: DC

## 2010-11-20 NOTE — Telephone Encounter (Signed)
z pack sent

## 2010-11-20 NOTE — Telephone Encounter (Signed)
Pt has had a cough and URI x 10 days.  Is coughing up green and yellow.  Mucinex is not helping, and neither is Delsym.  Wants RX to [employer] [employer]).

## 2010-11-20 NOTE — Telephone Encounter (Signed)
Notified pt. 

## 2010-11-25 ENCOUNTER — Encounter: Payer: Self-pay | Admitting: Internal Medicine

## 2010-11-25 ENCOUNTER — Ambulatory Visit (INDEPENDENT_AMBULATORY_CARE_PROVIDER_SITE_OTHER): Payer: 59 | Admitting: Internal Medicine

## 2010-11-25 ENCOUNTER — Ambulatory Visit (INDEPENDENT_AMBULATORY_CARE_PROVIDER_SITE_OTHER)
Admission: RE | Admit: 2010-11-25 | Discharge: 2010-11-25 | Disposition: A | Discharge status: Home / Self Care | Payer: 59 | Source: Ambulatory Visit | Attending: Internal Medicine | Admitting: Internal Medicine

## 2010-11-25 ENCOUNTER — Telehealth: Payer: Self-pay

## 2010-11-25 VITALS — BP 128/90 | HR 97 | Temp 98.4°F | Ht 64.0 in | Wt 213.0 lb

## 2010-11-25 DIAGNOSIS — R059 Cough, unspecified: Secondary | ICD-10-CM

## 2010-11-25 DIAGNOSIS — R05 Cough: Secondary | ICD-10-CM

## 2010-11-25 MED ORDER — LEVOFLOXACIN 500 MG PO TABS: 500.0000 mg | ORAL_TABLET | Freq: Every day | ORAL | Status: AC

## 2010-11-25 NOTE — Assessment & Plan Note (Signed)
Obtain CXR. Begin levaquin. Close followup with pmd and INR check already scheduled in 3 days.

## 2010-11-25 NOTE — Telephone Encounter (Signed)
Pt aware.

## 2010-11-25 NOTE — Telephone Encounter (Signed)
Message copied by Kyung Rudd on Tue Nov 25, 2010  5:36 PM ------      Message from: Clifton Custard      Created: Tue Nov 25, 2010  5:04 PM       cxr results. Radiologist didn't see an obvious pneumonia. Wonders if may be bronchitis. Definitely take the abx

## 2010-11-25 NOTE — Progress Notes (Signed)
  Subjective:    Patient ID: Erin Kirk, female    DOB: Jun 27, 1956, 55 y.o.   MRN: [id number]  HPI  Pt presents to clinic for evaluation of cough. Notes almost 2 wk h/o cough productive for green/yellow sputum with recent sweats at night. C/o right mid back pain that developed approximately 48 hours ago. Pain may be worse with position change. Attempted mucinex and completed zpak yesterday without improvement. Also states right ear pain developed yesterday without drainage.   Reviewed PMH, medications, and allergies    Review of Systems  Constitutional: Positive for fever and fatigue. Negative for chills.  HENT: Positive for ear pain and congestion. Negative for facial swelling and ear discharge.   Eyes: Negative for discharge and redness.  Respiratory: Positive for cough. Negative for shortness of breath and wheezing.   Cardiovascular: Negative for chest pain.  Musculoskeletal: Positive for back pain.  Skin: Negative for color change and rash.       Objective:   Physical Exam  Constitutional: She appears well-developed and well-nourished. No distress.  HENT:  Head: Normocephalic and atraumatic.  Right Ear: External ear and ear canal normal. Tympanic membrane is erythematous. Tympanic membrane is not retracted and not bulging.  Left Ear: Tympanic membrane, external ear and ear canal normal.  Nose: Nose normal.  Mouth/Throat: Oropharynx is clear and moist. No oropharyngeal exudate.  Eyes: Conjunctivae are normal. Right eye exhibits no discharge. Left eye exhibits no discharge. No scleral icterus.  Cardiovascular: Normal rate, regular rhythm and normal heart sounds.  Exam reveals no gallop and no friction rub.   No murmur heard. Pulmonary/Chest: Effort normal. No respiratory distress. She has no wheezes. She has no rales.       Intermittent ?[place] noise. No focal rale/rhonchi  Neurological: She is alert.  Skin: Skin is warm and dry. No rash noted. She is not diaphoretic. No  erythema.          Assessment & Plan:

## 2010-11-28 ENCOUNTER — Encounter: Payer: Self-pay | Admitting: Internal Medicine

## 2010-11-28 ENCOUNTER — Ambulatory Visit (INDEPENDENT_AMBULATORY_CARE_PROVIDER_SITE_OTHER): Payer: 59 | Admitting: Internal Medicine

## 2010-11-28 VITALS — BP 128/84 | HR 76 | Temp 98.8°F | Resp 16 | Ht 64.0 in | Wt 214.0 lb

## 2010-11-28 DIAGNOSIS — E669 Obesity, unspecified: Secondary | ICD-10-CM | POA: Insufficient documentation

## 2010-11-28 DIAGNOSIS — I4891 Unspecified atrial fibrillation: Secondary | ICD-10-CM

## 2010-11-28 DIAGNOSIS — R739 Hyperglycemia, unspecified: Secondary | ICD-10-CM

## 2010-11-28 DIAGNOSIS — E1169 Type 2 diabetes mellitus with other specified complication: Secondary | ICD-10-CM | POA: Insufficient documentation

## 2010-11-28 DIAGNOSIS — R05 Cough: Secondary | ICD-10-CM

## 2010-11-28 DIAGNOSIS — I10 Essential (primary) hypertension: Secondary | ICD-10-CM

## 2010-11-28 DIAGNOSIS — R7309 Other abnormal glucose: Secondary | ICD-10-CM

## 2010-11-28 DIAGNOSIS — D6859 Other primary thrombophilia: Secondary | ICD-10-CM

## 2010-11-28 DIAGNOSIS — E039 Hypothyroidism, unspecified: Secondary | ICD-10-CM

## 2010-11-28 LAB — T4, FREE: Free T4: 0.83 ng/dL (ref 0.60–1.60)

## 2010-11-28 LAB — BASIC METABOLIC PANEL
Calcium: 9.5 mg/dL (ref 8.4–10.5)
GFR: 73.33 mL/min (ref >60.00)
Sodium: 143 mEq/L (ref 135–145)

## 2010-11-28 LAB — TSH: TSH: 6.4 u[IU]/mL — ABNORMAL HIGH (ref 0.35–5.50)

## 2010-11-28 LAB — POCT INR: INR: 2.8

## 2010-11-28 NOTE — Progress Notes (Signed)
Addended by: Rossie Muskrat on: 11/28/2010 11:14 AM   Modules accepted: Orders

## 2010-11-28 NOTE — Patient Instructions (Signed)
Go to the pharmacy where the cough drops are [facility] immune defense drops They have vit C and echinacea and zinc. Use them 5-6 times a day The cough is best treated with mucinex fast max liquid cold and flu

## 2010-11-28 NOTE — Assessment & Plan Note (Signed)
monitoring of thyroid

## 2010-11-28 NOTE — Assessment & Plan Note (Signed)
Upper respiratory tract infection most probably viral in etiology have recommended that she take echinacea vitamin C and zinc as well as  Was given an antibiotic and had a chest x-ray which is clear I suspect that this is a viral syndrome and the best thing is to stimulate her immune system

## 2010-11-28 NOTE — Assessment & Plan Note (Signed)
The patient is a strong family history of diabetes and was noted to have an elevated blood sugar in a laboratory value obtained at [facility] she was on steroids due to the facial condition prior to this and this may represent reactive hyperglycemia. Anterior meds for the night prior to this blood work she had slice of cake and a glass of chocolate milk.  We will obtain an A1c today along with the other laboratory values if it is elevated we will  evaluateher for diabetes

## 2010-11-28 NOTE — Assessment & Plan Note (Signed)
Continues to have some palpitations she is off the appetite suppressants and the palpitations have not completely resolved if E. Reduction in stress with her brother-in-law moving out does not make a significant improvement in the sense of palpitations would recommend as her next A Holter monitor

## 2010-11-28 NOTE — Assessment & Plan Note (Addendum)
Stable blood presure but persistant palpitations. monitor potassium and thyroid

## 2010-11-28 NOTE — Progress Notes (Signed)
Subjective:    Patient ID: Erin Kirk, female    DOB: August 26, 1956, 55 y.o.   MRN: [id number]  HPI   patient presents today for a followup visit for her palpitations it was scheduled and also followup for an acute visit she had with one of our clinic doctors for an upper respiratory tract infection.  She was treated with an antibiotic and a chest x-ray was obtained which was clear she has a persistent lingering cough,fatigue but has no fever.   her blood pressure is well controlled on her current medications and since she has discontinued any of the diet aids the palpitations have not completely resolved.   thyroid measured 3 months ago was normal repeat thyroid studies today just as an insurance that hypothyroidism or hyperthyroidism is not playing a role there is an [place] site of the stressors in her line of and perhaps the reduction of stress and was of in a reduction of the palpitations we discussed next steps in evaluation should they not resolve  Review of Systems  Constitutional: Positive for activity change. Negative for appetite change and fatigue.  HENT: Negative for ear pain, congestion, neck pain, postnasal drip and sinus pressure.   Eyes: Negative for redness and visual disturbance.  Respiratory: Positive for cough, chest tightness and shortness of breath. Negative for wheezing.   Gastrointestinal: Negative for abdominal pain and abdominal distention.  Genitourinary: Negative for dysuria, frequency and menstrual problem.  Musculoskeletal: Negative for myalgias, joint swelling and arthralgias.  Skin: Negative for rash and wound.  Neurological: Negative for dizziness, weakness and headaches.  Hematological: Negative for adenopathy. Does not bruise/bleed easily.  Psychiatric/Behavioral: Negative for sleep disturbance and decreased concentration.   Past Medical History  Diagnosis Date  . Thyroid disease   . GERD (gastroesophageal reflux disease)   . Hypertension   .  Allergy   . Cancer     skin  . Primary hypercoagulable state    Past Surgical History  Procedure Date  . Cholecystectomy   . Abdominal hysterectomy   . Oophorectomy     reports that she has never smoked. She does not have any smokeless tobacco history on file. She reports that she does not drink alcohol or use illicit drugs. family history includes Coronary artery disease in an unspecified family member; Diabetes in her father and mother; and Hypertension in her father. Allergies  Allergen Reactions  . NFA:OZHYQMVHQIO+[id number] Acid+Aspartame     REACTION: unspecified        Objective:   Physical Exam  Constitutional: She is oriented to person, place, and time. She appears well-developed and well-nourished. No distress.  HENT:  Head: Normocephalic and atraumatic.  Right Ear: External ear normal.  Left Ear: External ear normal.  Nose: Nose normal.  Mouth/Throat: Oropharynx is clear and moist.  Eyes: Conjunctivae and EOM are normal. Pupils are equal, round, and reactive to light.  Neck: Normal range of motion. Neck supple. No JVD present. No tracheal deviation present. No thyromegaly present.  Cardiovascular: Normal rate, regular rhythm, normal heart sounds and intact distal pulses.   No murmur heard. Pulmonary/Chest: Effort normal and breath sounds normal. No respiratory distress. She has no wheezes. She has no rales. She exhibits no tenderness.  Abdominal: Soft. Bowel sounds are normal.  Musculoskeletal: Normal range of motion. She exhibits no edema and no tenderness.  Lymphadenopathy:    She has no cervical adenopathy.  Neurological: She is alert and oriented to person, place, and time. She has normal reflexes. No  cranial nerve deficit.  Skin: Skin is warm and dry. She is not diaphoretic.  Psychiatric: She has a normal mood and affect. Her behavior is normal.          Assessment & Plan:   she will complete the antibiotic we have given her instructions on  what to get over-the-counter to help resolve this viral syndrome her blood pressure stable on her current medications should her palpitations continue the next step is a Holter monitor she will call back and we will schedule that for her if we do not see a drop by mid April in the palpitations

## 2010-12-16 ENCOUNTER — Other Ambulatory Visit: Payer: Self-pay | Admitting: Internal Medicine

## 2010-12-25 ENCOUNTER — Ambulatory Visit: Payer: 59

## 2010-12-25 DIAGNOSIS — D732 Chronic congestive splenomegaly: Secondary | ICD-10-CM

## 2010-12-25 DIAGNOSIS — D6859 Other primary thrombophilia: Secondary | ICD-10-CM

## 2010-12-25 LAB — POCT INR: INR: 2.5

## 2010-12-25 NOTE — Patient Instructions (Signed)
Same dose 

## 2011-01-16 ENCOUNTER — Other Ambulatory Visit: Payer: Self-pay | Admitting: Internal Medicine

## 2011-01-28 ENCOUNTER — Ambulatory Visit (INDEPENDENT_AMBULATORY_CARE_PROVIDER_SITE_OTHER): Payer: 59 | Admitting: Internal Medicine

## 2011-01-28 ENCOUNTER — Encounter: Payer: Self-pay | Admitting: Internal Medicine

## 2011-01-28 VITALS — BP 140/84 | HR 72 | Temp 98.2°F | Resp 16 | Ht 64.0 in | Wt 218.0 lb

## 2011-01-28 DIAGNOSIS — R05 Cough: Secondary | ICD-10-CM

## 2011-01-28 DIAGNOSIS — I4891 Unspecified atrial fibrillation: Secondary | ICD-10-CM

## 2011-01-28 DIAGNOSIS — E039 Hypothyroidism, unspecified: Secondary | ICD-10-CM

## 2011-01-28 DIAGNOSIS — I10 Essential (primary) hypertension: Secondary | ICD-10-CM

## 2011-01-28 DIAGNOSIS — E663 Overweight: Secondary | ICD-10-CM

## 2011-01-28 DIAGNOSIS — K219 Gastro-esophageal reflux disease without esophagitis: Secondary | ICD-10-CM

## 2011-01-28 DIAGNOSIS — D6859 Other primary thrombophilia: Secondary | ICD-10-CM

## 2011-01-28 LAB — BASIC METABOLIC PANEL
Chloride: 101 mEq/L (ref 96–112)
GFR: 82.14 mL/min (ref >60.00)
Potassium: 3.6 mEq/L (ref 3.5–5.1)
Sodium: 140 mEq/L (ref 135–145)

## 2011-01-28 LAB — POCT INR: INR: 2.1

## 2011-01-28 LAB — TSH: TSH: 5.46 u[IU]/mL (ref 0.35–5.50)

## 2011-01-28 NOTE — Progress Notes (Signed)
Subjective:    Patient ID: Erin Kirk, female    DOB: Jul 14, 1956, 55 y.o.   MRN: [id number]  HPI Patient presents for followup for hypothyroidism hypertension and a history of a hypercoagulable syndrome she is currently on Coumadin and is due a pro time today for Coumadin management.  She states that she's been under increased stress in her family and notes that her blood pressure is elevated today she has been compliant with her medications she denies any chest pain unusual headaches unusual shortness of breath or unusual edema.  She has noticed increasing difficulty maintaining sleep  Review of Systems  Constitutional: Negative for activity change, appetite change and fatigue.  HENT: Negative for ear pain, congestion, neck pain, postnasal drip and sinus pressure.   Eyes: Negative for redness and visual disturbance.  Respiratory: Negative for cough, shortness of breath and wheezing.   Gastrointestinal: Positive for nausea. Negative for abdominal pain and abdominal distention.  Genitourinary: Negative for dysuria, frequency and menstrual problem.  Musculoskeletal: Negative for myalgias, joint swelling and arthralgias.  Skin: Negative for rash and wound.  Neurological: Negative for dizziness, weakness and headaches.  Hematological: Negative for adenopathy. Does not bruise/bleed easily.  Psychiatric/Behavioral: Positive for sleep disturbance. Negative for decreased concentration.   Past Medical History  Diagnosis Date  . Thyroid disease   . GERD (gastroesophageal reflux disease)   . Hypertension   . Allergy   . Cancer     skin  . Primary hypercoagulable state    Past Surgical History  Procedure Date  . Cholecystectomy   . Abdominal hysterectomy   . Oophorectomy     reports that she has never smoked. She does not have any smokeless tobacco history on file. She reports that she does not drink alcohol or use illicit drugs. family history includes Coronary artery disease in an  unspecified family member; Diabetes in her father and mother; and Hypertension in her father. Allergies  Allergen Reactions  . WUJ:WJXBJYNWGNF+AOZHYQMVH+QIONGEXBMW Acid+Aspartame     REACTION: unspecified        Objective:   Physical Exam  Constitutional: She is oriented to person, place, and time. She appears well-developed and well-nourished. No distress.  HENT:  Head: Normocephalic and atraumatic.  Right Ear: External ear normal.  Left Ear: External ear normal.  Nose: Nose normal.  Mouth/Throat: Oropharynx is clear and moist.  Eyes: Conjunctivae and EOM are normal. Pupils are equal, round, and reactive to light.  Neck: Normal range of motion. Neck supple. No JVD present. No tracheal deviation present. No thyromegaly present.  Cardiovascular: Normal rate, regular rhythm, normal heart sounds and intact distal pulses.   No murmur heard. Pulmonary/Chest: Effort normal and breath sounds normal. She has no wheezes. She exhibits no tenderness.  Abdominal: Soft. Bowel sounds are normal.  Musculoskeletal: Normal range of motion. She exhibits no edema and no tenderness.  Lymphadenopathy:    She has no cervical adenopathy.  Neurological: She is alert and oriented to person, place, and time. She has normal reflexes. No cranial nerve deficit.  Skin: Skin is warm and dry. She is not diaphoretic.          Assessment & Plan:  She's had no cervical paroxysmal atrial fibrillation she reports no lightheadedness or rapid heart rates.  She has been compliant with Coumadin therapy. She has been on a diet attempting to lose weight this is a primary intervention for both her hypertension and her venous insufficiency of her lower extremities.  She has been having increasing  difficulty maintaining sleep but suggestive of the increased stress is playing a role and depression.  We discussed intervention and she wishes to stay on her current medications at this time we offered counseling referral she  will consider this.  Blood pressure stable on current medications slight elevation today to be treated with salt restriction weight loss

## 2011-01-29 ENCOUNTER — Encounter: Payer: Self-pay | Admitting: Internal Medicine

## 2011-02-06 NOTE — Op Note (Signed)
Erin Kirk, Erin Kirk                ACCOUNT NO.:  [id number]   MEDICAL RECORD NO.:  [id number]          PATIENT TYPE:  AMB   LOCATION:  ENDO                         FACILITY[facility]  [facility]   PHYSICIAN:  Iva Boop, M.D. LHCDATE OF BIRTH:  1956/09/08   DATE OF PROCEDURE:  07/24/2005  DATE OF DISCHARGE:  07/24/2005                                 OPERATIVE REPORT   PROCEDURE:  Small bowel capsule endoscopy.   INDICATIONS:  Anemia.   ORDERING PHYSICIAN:  Vania Rea. Jarold Motto, M.D.   PROCEDURE DATA:  1.  Height:  64 inches.  2.  Weight:  210 pounds.  3.  Waist:  40 inches.  4.  Build:  Stocky.   GASTRIC PASSAGE TIME:  0 hours, 7 minutes.   SMALL BOWEL TRANSIT:  From the stomach to the ileocecal valve was  approximately 4 hours, 59 minutes.   These are both normal.   FINDINGS:  1.  Completed study exam.  Did reach the colon.  2.  Examination of the entire small bowel shows one small AVM in the      proximal small bowel in the jejunum.  3.  There are no other abnormal findings seen.   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  Whether or not this could cause her anemia is not entirely clear.   PLAN:  Clinical followup and further evaluation per Dr. Maxie Better.      Iva Boop, M.D. [facility]  Electronically Signed     CEG/MEDQ  D:  07/29/2005  T:  07/29/2005  Job:  [id number]   cc:   Vania Rea. Jarold Motto, M.D. [facility]  [address]  [place]  [place] [address]

## 2011-02-06 NOTE — H&P (Signed)
[place]. [facility]  Patient:    ANNALEE, MEYERHOFF Visit Number: [id number] MRN: [id number]          Service Type: MED Location: [phone] Attending Physician:  Corwin Levins Dictated by:   Corwin Levins, M.D. [facility] Admit Date:  07/28/2001   CC:         Stacie Glaze, M.D. [facility]   History and Physical  CHIEF COMPLAINT:  Chest pain, shortness of breath, nausea, sweaty, dizzy, and weak for over 30 minutes.  HISTORY OF PRESENT ILLNESS:  Mrs. Maudlin is a 55 year old white female here with acute onset of the above symptoms when she awoke at approximately 1:30 p.m. from a nap.  These are similar to the symptoms she thinks she had as previous when she was thought to possibly have a PE when she had a right lower extremity DVT in 1992.  She called EMS.  She has been treated with oxygen, three sublingual nitroglycerin, as well as two Vicodin in the ER and now much improved but still nauseous.  We have given her a GI cocktail in the ER for the nausea which does not seem to help.  Of note is she had left knee surgery yesterday, now on Lovenox planned for the next several days, and is started the Coumadin yesterday that she is on chronically as well for history DVT and positive anticardiolipin antibody.  PAST MEDICAL HISTORY:  ILLNESSES:  1. GERD.  2. Hiatal hernia.  3. Morbid obesity.  4. Hypertension.  5. Allergies.  6. Asthma.  7. Hypothyroidism.  8. History of right lower extremity DVT in September 1992 with questionable     PE and also recurrent superficial thrombophlebitis, worked up 1992 and     1994 - apparently positive for an anticardiolipin antibody and she was     started on Coumadin treatment long-term in 1994.  9. Recurrent benign palpitations. 10. Anxiety/depression.  SURGERIES:  1. Status post TAH/BSO.  2. Status post cholecystectomy.  3. History of lumbar disk surgery in 1993.  ALLERGIES:  "DYE OF A CT SCAN" and  AUGMENTIN.  MEDICATIONS:  1. Vicodin for the past two days.  2. Lovenox subcu b.i.d. for the last two days and is planned for three more     days.  3. Coumadin just restarted last night.  4. Ultram 50 mg q.4-6h. p.r.n.  5. Beconase AQ q.d. p.r.n.  6. Pulmicort two puffs q.d.  7. Albuterol metered dose inhaler p.r.n.  8. Folate.  9. Multivitamin. 10. Verapamil SR 120 and 180 mg - both in the morning. 11. Levothroid 0.125 mg p.o. q.d. 12. Maxzide 25 one p.o. q.d. 13. Claritin 10 mg p.o. q.d. 14. Paxil 20 mg p.o. q.d. 15. Prevacid 30 mg p.o. q.d.  She also was taking Celebrex and OxyContin prior to the surgery but none now.  SOCIAL HISTORY:  No tobacco, no alcohol, married, lives with her husband.  FAMILY HISTORY:  Diabetes and heart disease.  REVIEW OF SYSTEMS:  Otherwise noncontributory.  PHYSICAL EXAMINATION:  GENERAL:  Mrs. Trovato is a 55 year old white female, markedly nervous.  VITAL SIGNS:  Afebrile, blood pressure 131/86, respirations 20, pulse 77, O2 saturation 96% on room air.  HEENT:  Sclerae clear, TMs clear, pharynx benign.  NECK:  Without lymphadenopathy, JVD, thyromegaly.  CHEST:  No rales or wheezing.  CARDIAC:  Regular rate and rhythm.  ABDOMEN:  Soft, nontender, positive bowel sounds.  No organomegaly, no masses.  EXTREMITIES:  No edema.  The left knee has icepack for at least another 24 hours and no distal swelling.  There is negative Homans sign bilaterally. There is no active phlebitis superficially at this time.  LABORATORY DATA:  ECG sinus rhythm, otherwise normal.  Chest x-ray pending.  An i-STAT with glucose 148, otherwise normal.  Hemoglobin 13.4, white blood cell count 8.4.  Initial CPK 59, MB 0.4 - which is normal.  Troponin I 0.01 - which is normal.  INR 1.2, just restarting the Coumadin, and PTT 31.  ASSESSMENT AND PLAN: 1. Chest pain, questionable etiology, atypical.  Will admit.  Apply telemetry.    Give O2.  Rule out MI with  serial enzymes and ECG.  Check VQ scan, given    her IV dye problem, and Vicodin on a p.r.n. basis. 2. Left knee surgery.  Continue Lovenox and Coumadin as previous. 3. Hyperglycemia, mild.  Check hemoglobin A1c. 4. Hypertension, stable.  Continue medication as is. 5. Gastroesophageal reflux disease.  Unclear whether this is related to her    discomfort today.  Will increase the Prevacid to 30 mg b.i.d. 6. Anxious/depressed. Dictated by:   Corwin Levins, M.D. [facility] Attending Physician:  Corwin Levins DD:  07/28/01 TD:  07/29/01 Job: [id number]

## 2011-02-06 NOTE — Discharge Summary (Signed)
[place]. [facility]  Patient:    KARLI, WICKIZER Visit Number: [id number] MRN: [id number]          Service Type: MED Location: [phone] Attending Physician:  Corwin Levins Dictated by:   Corwin Levins, M.D. [facility] Admit Date:  07/28/2001 Discharge Date: 07/31/2001                             Discharge Summary  DISCHARGE DIAGNOSES: 1. Chest pain, unclear etiology, probable gastrointestinal. 2. Status post recent left knee surgery. 3. Mild hyperglycemia. 4. Anemia. 5. Hypothyroidism. 6. Hypertension. 7. History of asthma.  PROCEDURES:  VQ scan negative.  CONSULTS:  None.  HISTORY AND PHYSICAL:  See that dictated on the day of admission on July 28, 2001.  HOSPITAL COURSE:  Ms. Shoaf is a 55 year old white female admitted with chest discomfort as per HPI July 28, 2001.  She did well in the hospital with eventual resolution of the discomfort and a VQ scan was negative, telemetry negative.  Serial enzymes and ECG negative.  She was administered IV heparin and Coumadin given her history of anticardiolipin antibody without complications.  Dressing was removed from the left knee and she was allowed to hobble a bit about the room which she was rapidly able to do by the time of discharge and felt stable enough to go home with her husbands help.  As she was ambulatory well enough as above, eating well, no symptoms, and no new problems during hospitalization, she was felt to have gained maximum benefits of hospitalization.  DISPOSITION:  Discharged to home in good condition.  ACTIVITY:  As tolerated.  DIET:  As before.  No added sweets, low sodium.  DISCHARGE MEDICATIONS:  All exactly as per prior hospitalization except no Lovenox.  These include:  1. Vicodin p.r.n.  2. Coumadin as directed with INR at time of discharge 1.7.  3. Ultram 50 mg q.4-6h. p.r.n.  4. Beconase AQ p.r.n.  5. Pulmicort two puffs q.d.  6. Albuterol metered dose  inhaler p.r.n.  7. Folate.  8. Multivitamins.  9. Verapamil SR 120 and 180 both in the morning. 10. Levothroid 0.125. 11. Maxzide 25. 12. Ferritin 10. 13. Paxil 20 mg. 14. Prevacid 30 mg p.o. all on a daily basis.  FOLLOW-UP:  She is to follow up with Dr. Lovell Sheehan with a repeat protime and INR on Monday, August 01, 2001, as planned prior to hospitalization. Dictated by:   Corwin Levins, M.D. [facility] Attending Physician:  Corwin Levins DD:  07/31/01 TD:  08/01/01 Job: [id number]

## 2011-02-18 ENCOUNTER — Other Ambulatory Visit: Payer: Self-pay | Admitting: Internal Medicine

## 2011-02-27 ENCOUNTER — Ambulatory Visit (INDEPENDENT_AMBULATORY_CARE_PROVIDER_SITE_OTHER): Payer: 59 | Admitting: Internal Medicine

## 2011-02-27 DIAGNOSIS — D6859 Other primary thrombophilia: Secondary | ICD-10-CM

## 2011-02-27 NOTE — Patient Instructions (Signed)
Same dose 

## 2011-03-20 ENCOUNTER — Other Ambulatory Visit: Payer: Self-pay | Admitting: Internal Medicine

## 2011-04-01 ENCOUNTER — Ambulatory Visit (INDEPENDENT_AMBULATORY_CARE_PROVIDER_SITE_OTHER): Payer: 59 | Admitting: Internal Medicine

## 2011-04-01 DIAGNOSIS — D6859 Other primary thrombophilia: Secondary | ICD-10-CM

## 2011-04-01 DIAGNOSIS — Z7901 Long term (current) use of anticoagulants: Secondary | ICD-10-CM

## 2011-04-01 LAB — POCT INR: INR: 2.5

## 2011-04-01 NOTE — Patient Instructions (Signed)
Same dose 

## 2011-04-20 ENCOUNTER — Other Ambulatory Visit: Payer: Self-pay | Admitting: Internal Medicine

## 2011-04-24 LAB — PROTIME-INR

## 2011-04-30 ENCOUNTER — Ambulatory Visit (INDEPENDENT_AMBULATORY_CARE_PROVIDER_SITE_OTHER): Payer: 59 | Admitting: Internal Medicine

## 2011-04-30 ENCOUNTER — Encounter: Payer: Self-pay | Admitting: Internal Medicine

## 2011-04-30 VITALS — BP 132/84 | HR 76 | Temp 98.2°F | Resp 16 | Ht 64.0 in | Wt 216.0 lb

## 2011-04-30 DIAGNOSIS — D6859 Other primary thrombophilia: Secondary | ICD-10-CM

## 2011-04-30 DIAGNOSIS — R739 Hyperglycemia, unspecified: Secondary | ICD-10-CM

## 2011-04-30 DIAGNOSIS — I10 Essential (primary) hypertension: Secondary | ICD-10-CM

## 2011-04-30 DIAGNOSIS — E1169 Type 2 diabetes mellitus with other specified complication: Secondary | ICD-10-CM

## 2011-04-30 DIAGNOSIS — R7309 Other abnormal glucose: Secondary | ICD-10-CM

## 2011-04-30 DIAGNOSIS — IMO0002 Reserved for concepts with insufficient information to code with codable children: Secondary | ICD-10-CM

## 2011-04-30 DIAGNOSIS — E1165 Type 2 diabetes mellitus with hyperglycemia: Secondary | ICD-10-CM

## 2011-04-30 LAB — POCT INR: INR: 1.8

## 2011-04-30 MED ORDER — SITAGLIP PHOS-METFORMIN HCL ER 50-1000 MG PO TB24: 1.0000 | ORAL_TABLET | Freq: Every day | ORAL | Status: DC

## 2011-04-30 NOTE — Patient Instructions (Addendum)
Taken the medication every morning with breakfast check a fingerstick glucose before breakfast at least 3 times a week and record them on a piece of paper if you ever feel sweaty or lightheaded check her blood glucose then as well  Same-5mg , 5mg , 2.5mg . ALT

## 2011-04-30 NOTE — Assessment & Plan Note (Signed)
Elevation of A1c seen that has persisted after stopping the steroids glucose around her recent surgery was 181 A1c was 7.7 indicating early adult onset diabetes.  She presents today to discuss initiation of therapy and losing weight

## 2011-04-30 NOTE — Progress Notes (Signed)
Subjective:    Patient ID: Erin Kirk, female    DOB: 24-Nov-1955, 55 y.o.   MRN: [id number]  HPI Patient is a 55 year old white female with a history of hypertension gastroesophageal reflux and chronic hypercoagulable syndrome on Coumadin who presents with a A1c drawn at [facility] prior to surgery in the 8 range and a fasting blood glucose in the 160 range.  She has been struggling with hyperglycemia for 2 years now 2 years ago her A1c was 5.6 most recently she's had an elevated A1c on steroids and after stopping the steroids A1c did not return to the normal range she is apparently become an adult onset diabetic.  We spent 30 minutes face-to-face counseling with the patient about adult-onset diabetes how to monitor her we gave her a glucometer as well as started her on appropriate medications for controlling her blood glucose.     Review of Systems  Constitutional: Negative for activity change, appetite change and fatigue.  HENT: Negative for ear pain, congestion, neck pain, postnasal drip and sinus pressure.   Eyes: Negative for redness and visual disturbance.  Respiratory: Negative for cough, shortness of breath and wheezing.   Gastrointestinal: Negative for abdominal pain and abdominal distention.  Genitourinary: Negative for dysuria, frequency and menstrual problem.  Musculoskeletal: Negative for myalgias, joint swelling and arthralgias.  Skin: Negative for rash and wound.  Neurological: Negative for dizziness, weakness and headaches.  Hematological: Negative for adenopathy. Does not bruise/bleed easily.  Psychiatric/Behavioral: Negative for sleep disturbance and decreased concentration.       Past Medical History  Diagnosis Date  . Thyroid disease   . GERD (gastroesophageal reflux disease)   . Hypertension   . Allergy   . Cancer     skin  . Primary hypercoagulable state    Past Surgical History  Procedure Date  . Cholecystectomy   . Abdominal hysterectomy   .  Oophorectomy     reports that she has never smoked. She does not have any smokeless tobacco history on file. She reports that she does not drink alcohol or use illicit drugs. family history includes Coronary artery disease in an unspecified family member; Diabetes in her father and mother; and Hypertension in her father. Allergies  Allergen Reactions  . WUJ:WJXBJYNWGNF+AOZHYQMVH+QIONGEXBMW Acid+Aspartame     REACTION: unspecified    Objective:   Physical Exam  Nursing note and vitals reviewed. Constitutional: She is oriented to person, place, and time. She appears well-developed and well-nourished. No distress.  HENT:  Head: Normocephalic and atraumatic.  Right Ear: External ear normal.  Left Ear: External ear normal.  Nose: Nose normal.  Mouth/Throat: Oropharynx is clear and moist.  Eyes: Conjunctivae and EOM are normal. Pupils are equal, round, and reactive to light.  Neck: Normal range of motion. Neck supple. No JVD present. No tracheal deviation present. No thyromegaly present.  Cardiovascular: Normal rate, regular rhythm, normal heart sounds and intact distal pulses.   No murmur heard. Pulmonary/Chest: Effort normal and breath sounds normal. She has no wheezes. She exhibits no tenderness.  Abdominal: Soft. Bowel sounds are normal.  Musculoskeletal: Normal range of motion. She exhibits no edema and no tenderness.  Lymphadenopathy:    She has no cervical adenopathy.  Neurological: She is alert and oriented to person, place, and time. She has normal reflexes. No cranial nerve deficit.  Skin: Skin is warm and dry. She is not diaphoretic.  Psychiatric: She has a normal mood and affect. Her behavior is normal.  Assessment & Plan:  Patient is a 55-year-old white female with new diagnosis of adult-onset diabetes.  We'll begin combination therapy with 2 medications Januvia and metformin and extended-release formation a daily basis we reviewed her A1c in bloodwork from  [facility] we've given her a glucometer taught how to use it we spent more than 30 minutes face-to-face of which over half was counseling

## 2011-05-04 ENCOUNTER — Other Ambulatory Visit: Payer: Self-pay | Admitting: Internal Medicine

## 2011-05-28 ENCOUNTER — Other Ambulatory Visit (INDEPENDENT_AMBULATORY_CARE_PROVIDER_SITE_OTHER): Payer: 59

## 2011-05-28 DIAGNOSIS — D6859 Other primary thrombophilia: Secondary | ICD-10-CM

## 2011-05-28 LAB — POCT INR: INR: 1.7

## 2011-05-28 NOTE — Patient Instructions (Signed)
5 mg everyday,check in 3 weeks

## 2011-06-04 ENCOUNTER — Other Ambulatory Visit: Payer: Self-pay | Admitting: Internal Medicine

## 2011-06-30 ENCOUNTER — Encounter: Payer: Self-pay | Admitting: Internal Medicine

## 2011-06-30 ENCOUNTER — Ambulatory Visit (INDEPENDENT_AMBULATORY_CARE_PROVIDER_SITE_OTHER): Payer: 59 | Admitting: Internal Medicine

## 2011-06-30 VITALS — BP 130/80 | HR 76 | Temp 98.2°F | Resp 16 | Ht 64.0 in | Wt 210.0 lb

## 2011-06-30 DIAGNOSIS — B070 Plantar wart: Secondary | ICD-10-CM

## 2011-06-30 DIAGNOSIS — E119 Type 2 diabetes mellitus without complications: Secondary | ICD-10-CM

## 2011-06-30 DIAGNOSIS — D6859 Other primary thrombophilia: Secondary | ICD-10-CM

## 2011-06-30 DIAGNOSIS — I10 Essential (primary) hypertension: Secondary | ICD-10-CM

## 2011-06-30 DIAGNOSIS — Z23 Encounter for immunization: Secondary | ICD-10-CM

## 2011-06-30 LAB — POCT INR: INR: 2.2

## 2011-06-30 NOTE — Progress Notes (Signed)
Subjective:    Patient ID: Erin Kirk, female    DOB: 09/07/1956, 55 y.o.   MRN: [id number]  HPI Improved control of her diabetes she reports that her CBGs have been between 100 120 Weight loss noted Blood pressure stable Last hemoglobin A1c was 7.7 She has no reported complications from her hypercoagulable syndrome. She complains of painful plantar wart of the bottom of her left foot  Review of Systems  Constitutional: Negative for activity change, appetite change and fatigue.  HENT: Negative for ear pain, congestion, neck pain, postnasal drip and sinus pressure.   Eyes: Negative for redness and visual disturbance.  Respiratory: Negative for cough, shortness of breath and wheezing.   Gastrointestinal: Negative for abdominal pain and abdominal distention.  Genitourinary: Negative for dysuria, frequency and menstrual problem.  Musculoskeletal: Negative for myalgias, joint swelling and arthralgias.  Skin: Negative for rash and wound.  Neurological: Negative for dizziness, weakness and headaches.  Hematological: Negative for adenopathy. Does not bruise/bleed easily.  Psychiatric/Behavioral: Negative for sleep disturbance and decreased concentration.   Past Medical History  Diagnosis Date  . Thyroid disease   . GERD (gastroesophageal reflux disease)   . Hypertension   . Allergy   . Cancer     skin  . Primary hypercoagulable state     History   Social History  . Marital Status: Married    Spouse Name: N/A    Number of Children: N/A  . Years of Education: N/A   Occupational History  . Not on file.   Social History Main Topics  . Smoking status: Never Smoker   . Smokeless tobacco: Not on file  . Alcohol Use: No  . Drug Use: No  . Sexually Active: Yes   Other Topics Concern  . Not on file   Social History Narrative  . No narrative on file    Past Surgical History  Procedure Date  . Cholecystectomy   . Abdominal hysterectomy   . Oophorectomy     Family  History  Problem Relation Age of Onset  . Coronary artery disease      fhx  . Diabetes Mother   . Hypertension Father   . Diabetes Father     Allergies  Allergen Reactions  . WUJ:WJXBJYNWGNF+AOZHYQMVH+QIONGEXBMW Acid+Aspartame     REACTION: unspecified    Current Outpatient Prescriptions on File Prior to Visit  Medication Sig Dispense Refill  . acetaminophen (TYLENOL) 325 MG tablet Take 650 mg by mouth every 6 (six) hours as needed.        . Ascorbic Acid (VITAMIN C) 500 MG tablet Take 500 mg by mouth daily.        . Calcium Citrate-Vitamin D 250-200 MG-UNIT TABS Take by mouth 2 (two) times daily.        Marland Kitchen CARDIZEM CD 360 MG 24 hr capsule TAKE ONE CAPSULE BY MOUTH EVERY DAY  30 each  11  . Casanthranol-Docusate Sodium 30-100 MG CAPS Take by mouth daily.        Marland Kitchen COUMADIN 5 MG tablet TAKE AS DIRECTED  100 each  6  . DIOVAN 320 MG tablet TAKE ONE TABLET BY MOUTH EVERY DAY  30 each  11  . Doxepin HCl (SILENOR) 6 MG TABS Take by mouth as needed.        . folic acid (FOLVITE) 1 MG tablet TAKE ONE TABLET BY MOUTH EVERY DAY  30 tablet  11  . HYDROcodone-acetaminophen (VICODIN) 5-500 MG per tablet Take 1 tablet by mouth every  6 (six) hours as needed.        Marland Kitchen levothyroxine (SYNTHROID, LEVOTHROID) 100 MCG tablet TAKE ONE TABLET BY MOUTH EVERY DAY  30 tablet  11  . MAXZIDE-25 37.5-25 MG per tablet TAKE ONE TABLET BY MOUTH IN THE MORNING  30 each  11  . metoprolol succinate (TOPROL-XL) 25 MG 24 hr tablet TAKE ONE TABLET BY MOUTH EVERY DAY  30 tablet  11  . promethazine (PHENERGAN) 25 MG suppository Place 25 mg rectally every 6 (six) hours as needed.        . ranitidine (ZANTAC) 300 MG tablet TAKE ONE TABLET BY MOUTH AT BEDTIME  30 tablet  11  . Vitamin D, Ergocalciferol, (DRISDOL) 50000 UNITS CAPS TAKE ONE CAPSULE BY MOUTH ONCE A WEEK  5 capsule  3    BP 130/80  Pulse 76  Temp 98.2 F (36.8 C)  Resp 16  Ht 5\' 4"  (1.626 m)  Wt 210 lb (95.255 kg)  BMI 36.05 kg/m2        Objective:     Physical Exam  Nursing note and vitals reviewed. Constitutional: She is oriented to person, place, and time. She appears well-developed and well-nourished. No distress.  HENT:  Head: Normocephalic and atraumatic.  Right Ear: External ear normal.  Left Ear: External ear normal.  Nose: Nose normal.  Mouth/Throat: Oropharynx is clear and moist.  Eyes: Conjunctivae and EOM are normal. Pupils are equal, round, and reactive to light.  Neck: Normal range of motion. Neck supple. No JVD present. No tracheal deviation present. No thyromegaly present.  Cardiovascular: Normal rate, regular rhythm, normal heart sounds and intact distal pulses.   No murmur heard. Pulmonary/Chest: Effort normal and breath sounds normal. She has no wheezes. She exhibits no tenderness.  Abdominal: Soft. Bowel sounds are normal.  Musculoskeletal: Normal range of motion. She exhibits no edema and no tenderness.  Lymphadenopathy:    She has no cervical adenopathy.  Neurological: She is alert and oriented to person, place, and time. She has normal reflexes. No cranial nerve deficit.  Skin: Skin is warm and dry. She is not diaphoretic.  Psychiatric: She has a normal mood and affect. Her behavior is normal.    She has a 0.5 cm plantar wart on her left foot      Assessment & Plan:  The patient's blood pressure stable on her current medications.  She continues on Coumadin for a history of hypercoagulable syndrome with a history of recurrent deep venous thromboses and she is asymptomatic at this time.  Her blood sugars have been in better control her diabetes appears to be stable hemoglobin A1c should be monitored we reviewed the need for following a strict diet in pursuing weight loss and exercise on a regular basis The lesion on her foot was identified as a plantar wart  and treated with liquid nitrogen    Informed consent was obtained in the 0.5 cm wart on the left foot was treated for 60 seconds of liquid nitrogen  application the patient tolerated the procedure well as procedural care was discussed with the patient and instructions should the lesion reappears contact our office immediately

## 2011-06-30 NOTE — Patient Instructions (Addendum)
Patient was instructed to continue all medications as prescribed. To stop at the checkout desk and schedule a followup appointment    Latest dosing instructions   Total Glynis Smiles Tue Wed Thu Fri Sat   35 5 mg 5 mg 5 mg 5 mg 5 mg 5 mg 5 mg    (5 mg1) (5 mg1) (5 mg1) (5 mg1) (5 mg1) (5 mg1) (5 mg1)

## 2011-07-06 ENCOUNTER — Telehealth: Payer: Self-pay | Admitting: Internal Medicine

## 2011-07-06 DIAGNOSIS — R739 Hyperglycemia, unspecified: Secondary | ICD-10-CM

## 2011-07-06 MED ORDER — SITAGLIP PHOS-METFORMIN HCL ER 50-1000 MG PO TB24: 1.0000 | ORAL_TABLET | Freq: Every day | ORAL | Status: DC

## 2011-07-06 NOTE — Telephone Encounter (Signed)
Sent in

## 2011-07-06 NOTE — Telephone Encounter (Signed)
Pt requesting rx for Janumet xr be sent into [facility]

## 2011-07-16 ENCOUNTER — Telehealth: Payer: Self-pay

## 2011-07-16 NOTE — Telephone Encounter (Signed)
Pt called and stated she has poision ivy and would like to have an rx sent in to [facility] on [address].

## 2011-07-16 NOTE — Telephone Encounter (Signed)
Left message on machine Dr Lovell Sheehan gone for the pm.informed she can wait till am or come in and hae ov with partner

## 2011-07-17 ENCOUNTER — Other Ambulatory Visit: Payer: Self-pay | Admitting: *Deleted

## 2011-07-17 MED ORDER — PREDNISONE (PAK) 10 MG PO TABS: ORAL_TABLET | ORAL | Status: DC

## 2011-07-17 NOTE — Telephone Encounter (Signed)
Per dr Lovell Sheehan- may have prednisone dose pack

## 2011-07-17 NOTE — Telephone Encounter (Signed)
Pt informed med has been called in

## 2011-07-28 ENCOUNTER — Ambulatory Visit (INDEPENDENT_AMBULATORY_CARE_PROVIDER_SITE_OTHER): Payer: 59 | Admitting: Internal Medicine

## 2011-07-28 DIAGNOSIS — D6859 Other primary thrombophilia: Secondary | ICD-10-CM

## 2011-07-28 NOTE — Patient Instructions (Signed)
  Latest dosing instructions   Total Sun Mon Tue Wed Thu Fri Sat   35 5 mg 5 mg 5 mg 5 mg 5 mg 5 mg 5 mg    (5 mg1) (5 mg1) (5 mg1) (5 mg1) (5 mg1) (5 mg1) (5 mg1)        

## 2011-08-25 ENCOUNTER — Ambulatory Visit (INDEPENDENT_AMBULATORY_CARE_PROVIDER_SITE_OTHER): Payer: 59 | Admitting: Internal Medicine

## 2011-08-25 DIAGNOSIS — D6859 Other primary thrombophilia: Secondary | ICD-10-CM

## 2011-08-25 LAB — POCT INR: INR: 2.1

## 2011-08-25 NOTE — Patient Instructions (Signed)
  Latest dosing instructions   Total Sun Mon Tue Wed Thu Fri Sat   35 5 mg 5 mg 5 mg 5 mg 5 mg 5 mg 5 mg    (5 mg1) (5 mg1) (5 mg1) (5 mg1) (5 mg1) (5 mg1) (5 mg1)        

## 2011-09-02 ENCOUNTER — Other Ambulatory Visit: Payer: Self-pay | Admitting: Internal Medicine

## 2011-09-23 ENCOUNTER — Other Ambulatory Visit: Payer: Self-pay | Admitting: *Deleted

## 2011-09-23 MED ORDER — GLUCOSE BLOOD VI STRP: ORAL_STRIP | Status: DC

## 2011-09-30 ENCOUNTER — Encounter: Payer: Self-pay | Admitting: Internal Medicine

## 2011-09-30 ENCOUNTER — Ambulatory Visit (INDEPENDENT_AMBULATORY_CARE_PROVIDER_SITE_OTHER): Payer: 59 | Admitting: Internal Medicine

## 2011-09-30 VITALS — BP 136/86 | HR 72 | Temp 98.3°F | Resp 16 | Ht 64.0 in | Wt 204.0 lb

## 2011-09-30 DIAGNOSIS — D6859 Other primary thrombophilia: Secondary | ICD-10-CM

## 2011-09-30 DIAGNOSIS — M545 Low back pain: Secondary | ICD-10-CM

## 2011-09-30 DIAGNOSIS — E663 Overweight: Secondary | ICD-10-CM

## 2011-09-30 DIAGNOSIS — I4891 Unspecified atrial fibrillation: Secondary | ICD-10-CM

## 2011-09-30 DIAGNOSIS — E1165 Type 2 diabetes mellitus with hyperglycemia: Secondary | ICD-10-CM | POA: Diagnosis not present

## 2011-09-30 DIAGNOSIS — Z0000 Encounter for general adult medical examination without abnormal findings: Secondary | ICD-10-CM | POA: Diagnosis not present

## 2011-09-30 DIAGNOSIS — E1169 Type 2 diabetes mellitus with other specified complication: Secondary | ICD-10-CM

## 2011-09-30 LAB — POCT URINALYSIS DIPSTICK
Glucose, UA: NEGATIVE
Nitrite, UA: NEGATIVE
Spec Grav, UA: 1.02
Urobilinogen, UA: 0.2

## 2011-09-30 LAB — POCT INR: INR: 2.3

## 2011-09-30 NOTE — Progress Notes (Signed)
Addended by: [name] R on: 09/30/2011 10:15 AM   Modules accepted: Orders

## 2011-09-30 NOTE — Progress Notes (Signed)
Subjective:    Patient ID: Erin Kirk, female    DOB: 07/22/1956, 56 y.o.   MRN: [id number]  HPI  Pt has a twisting injury to back and has spasms that are improving. Patient's injury is significantly improving over time she now has less pain with taking a deep breath.  Patient blood pressure is stable on her current medications. Weight is down 6 pounds she feels  that would be down further but she overate at Christmas time  Reports mild increases in CBG's  Review of Systems  Constitutional: Negative for activity change, appetite change and fatigue.  HENT: Negative for ear pain, congestion, neck pain, postnasal drip and sinus pressure.   Eyes: Negative for redness and visual disturbance.  Respiratory: Negative for cough, shortness of breath and wheezing.   Gastrointestinal: Negative for abdominal pain and abdominal distention.  Genitourinary: Negative for dysuria, frequency and menstrual problem.  Musculoskeletal: Negative for myalgias, joint swelling and arthralgias.  Skin: Negative for rash and wound.  Neurological: Negative for dizziness, weakness and headaches.  Hematological: Negative for adenopathy. Does not bruise/bleed easily.  Psychiatric/Behavioral: Negative for sleep disturbance and decreased concentration.   Past Medical History  Diagnosis Date  . Thyroid disease   . GERD (gastroesophageal reflux disease)   . Hypertension   . Allergy   . Cancer     skin  . Primary hypercoagulable state     History   Social History  . Marital Status: Married    Spouse Name: N/A    Number of Children: N/A  . Years of Education: N/A   Occupational History  . Not on file.   Social History Main Topics  . Smoking status: Never Smoker   . Smokeless tobacco: Not on file  . Alcohol Use: No  . Drug Use: No  . Sexually Active: Yes   Other Topics Concern  . Not on file   Social History Narrative  . No narrative on file    Past Surgical History  Procedure Date  .  Cholecystectomy   . Abdominal hysterectomy   . Oophorectomy     Family History  Problem Relation Age of Onset  . Coronary artery disease      fhx  . Diabetes Mother   . Hypertension Father   . Diabetes Father     Allergies  Allergen Reactions  . WUJ:WJXBJYNWGNF+AOZHYQMVH+QIONGEXBMW Acid+Aspartame     REACTION: unspecified    Current Outpatient Prescriptions on File Prior to Visit  Medication Sig Dispense Refill  . acetaminophen (TYLENOL) 325 MG tablet Take 650 mg by mouth every 6 (six) hours as needed.        . Ascorbic Acid (VITAMIN C) 500 MG tablet Take 500 mg by mouth daily.        . Calcium Citrate-Vitamin D 250-200 MG-UNIT TABS Take by mouth 2 (two) times daily.        Marland Kitchen CARDIZEM CD 360 MG 24 hr capsule TAKE ONE CAPSULE BY MOUTH EVERY DAY  30 each  11  . Casanthranol-Docusate Sodium 30-100 MG CAPS Take by mouth daily.        Marland Kitchen COUMADIN 5 MG tablet TAKE AS DIRECTED  100 each  6  . CYMBALTA 60 MG capsule TAKE ONE CAPSULE BY MOUTH EVERY DAY  30 each  11  . DIOVAN 320 MG tablet TAKE ONE TABLET BY MOUTH EVERY DAY  30 each  11  . Doxepin HCl (SILENOR) 6 MG TABS Take by mouth as needed.        Marland Kitchen  folic acid (FOLVITE) 1 MG tablet TAKE ONE TABLET BY MOUTH EVERY DAY  30 tablet  11  . glucose blood test strip Use as instructed  100 each  12  . HYDROcodone-acetaminophen (VICODIN) 5-500 MG per tablet Take 1 tablet by mouth every 6 (six) hours as needed.        Marland Kitchen levothyroxine (SYNTHROID, LEVOTHROID) 100 MCG tablet TAKE ONE TABLET BY MOUTH EVERY DAY  30 tablet  11  . MAXZIDE-25 37.5-25 MG per tablet TAKE ONE TABLET BY MOUTH IN THE MORNING  30 each  11  . metoprolol succinate (TOPROL-XL) 25 MG 24 hr tablet TAKE ONE TABLET BY MOUTH EVERY DAY  30 tablet  11  . predniSONE (STERAPRED UNI-PAK) 10 MG tablet 12 days   30 tablet  0  . promethazine (PHENERGAN) 25 MG suppository Place 25 mg rectally every 6 (six) hours as needed.        . ranitidine (ZANTAC) 300 MG tablet TAKE ONE TABLET BY MOUTH  AT BEDTIME  30 tablet  11  . SitaGLIPtin-MetFORMIN HCl (JANUMET XR) 50-1000 MG TB24 Take 1 tablet by mouth daily with breakfast.  30 tablet  11  . Vitamin D, Ergocalciferol, (DRISDOL) 50000 UNITS CAPS TAKE ONE CAPSULE BY MOUTH ONCE A WEEK  5 capsule  3    BP 136/86  Pulse 72  Temp 98.3 F (36.8 C)  Resp 16  Ht 5\' 4"  (1.626 m)  Wt 204 lb (92.534 kg)  BMI 35.02 kg/m2       Objective:   Physical Exam  Constitutional: She is oriented to person, place, and time. She appears well-developed and well-nourished. No distress.  HENT:  Head: Normocephalic and atraumatic.  Right Ear: External ear normal.  Left Ear: External ear normal.  Nose: Nose normal.  Mouth/Throat: Oropharynx is clear and moist.  Eyes: Conjunctivae and EOM are normal. Pupils are equal, round, and reactive to light.  Neck: Normal range of motion. Neck supple. No JVD present. No tracheal deviation present. No thyromegaly present.  Cardiovascular: Normal rate, regular rhythm, normal heart sounds and intact distal pulses.   No murmur heard. Pulmonary/Chest: Effort normal and breath sounds normal. She has no wheezes. She exhibits no tenderness.  Abdominal: Soft. Bowel sounds are normal.  Musculoskeletal: Normal range of motion. She exhibits no edema and no tenderness.  Lymphadenopathy:    She has no cervical adenopathy.  Neurological: She is alert and oriented to person, place, and time. She has normal reflexes. No cranial nerve deficit.  Skin: Skin is warm and dry. She is not diaphoretic.  Psychiatric: She has a normal mood and affect. Her behavior is normal.          Assessment & Plan:  HTN stable monitoring A1c for glucose control, pt notes increased with prednisone in November Discussed MSK injury and course continue supportive care  pro time will be obtained for monitoring of Coumadin therapy  TSH is due for monitoring of thyroid

## 2011-09-30 NOTE — Patient Instructions (Addendum)
The patient is instructed to continue all medications as prescribed. Schedule followup with check out clerk upon leaving the clinic     Latest dosing instructions   Total Sun Mon Tue Wed Thu Fri Sat   35 5 mg 5 mg 5 mg 5 mg 5 mg 5 mg 5 mg    (5 mg1) (5 mg1) (5 mg1) (5 mg1) (5 mg1) (5 mg1) (5 mg1)        

## 2011-10-05 ENCOUNTER — Other Ambulatory Visit: Payer: Self-pay | Admitting: Internal Medicine

## 2011-10-28 ENCOUNTER — Ambulatory Visit (INDEPENDENT_AMBULATORY_CARE_PROVIDER_SITE_OTHER): Payer: 59 | Admitting: Internal Medicine

## 2011-10-28 DIAGNOSIS — D6859 Other primary thrombophilia: Secondary | ICD-10-CM | POA: Diagnosis not present

## 2011-10-28 NOTE — Patient Instructions (Signed)
  Latest dosing instructions   Total Sun Mon Tue Wed Thu Fri Sat   35 5 mg 5 mg 5 mg 5 mg 5 mg 5 mg 5 mg    (5 mg1) (5 mg1) (5 mg1) (5 mg1) (5 mg1) (5 mg1) (5 mg1)        

## 2011-11-25 ENCOUNTER — Ambulatory Visit (INDEPENDENT_AMBULATORY_CARE_PROVIDER_SITE_OTHER): Payer: 59 | Admitting: Internal Medicine

## 2011-11-25 DIAGNOSIS — I82409 Acute embolism and thrombosis of unspecified deep veins of unspecified lower extremity: Secondary | ICD-10-CM | POA: Diagnosis not present

## 2011-11-25 NOTE — Patient Instructions (Signed)
  Latest dosing instructions   Total Sun Mon Tue Wed Thu Fri Sat   35 5 mg 5 mg 5 mg 5 mg 5 mg 5 mg 5 mg    (5 mg1) (5 mg1) (5 mg1) (5 mg1) (5 mg1) (5 mg1) (5 mg1)        

## 2011-12-05 ENCOUNTER — Other Ambulatory Visit: Payer: Self-pay | Admitting: Internal Medicine

## 2011-12-25 ENCOUNTER — Other Ambulatory Visit (INDEPENDENT_AMBULATORY_CARE_PROVIDER_SITE_OTHER): Payer: 59

## 2011-12-25 DIAGNOSIS — E119 Type 2 diabetes mellitus without complications: Secondary | ICD-10-CM

## 2011-12-25 DIAGNOSIS — I82409 Acute embolism and thrombosis of unspecified deep veins of unspecified lower extremity: Secondary | ICD-10-CM | POA: Diagnosis not present

## 2011-12-25 DIAGNOSIS — Z79899 Other long term (current) drug therapy: Secondary | ICD-10-CM | POA: Diagnosis not present

## 2011-12-25 DIAGNOSIS — Z0000 Encounter for general adult medical examination without abnormal findings: Secondary | ICD-10-CM | POA: Diagnosis not present

## 2011-12-25 DIAGNOSIS — IMO0001 Reserved for inherently not codable concepts without codable children: Secondary | ICD-10-CM

## 2011-12-25 LAB — CBC WITH DIFFERENTIAL/PLATELET
Basophils Relative: 0.8 % (ref 0.0–3.0)
Eosinophils Absolute: 0.3 10*3/uL (ref 0.0–0.7)
Eosinophils Relative: 5 % (ref 0.0–5.0)
HCT: 37.3 % (ref 36.0–46.0)
Lymphs Abs: 1.6 10*3/uL (ref 0.7–4.0)
MCHC: 33.2 g/dL (ref 30.0–36.0)
MCV: 87.3 fl (ref 78.0–100.0)
Monocytes Absolute: 0.4 10*3/uL (ref 0.1–1.0)
Neutro Abs: 3.7 10*3/uL (ref 1.4–7.7)
Neutrophils Relative %: 61.1 % (ref 43.0–77.0)
RBC: 4.28 Mil/uL (ref 3.87–5.11)

## 2011-12-25 LAB — HEPATIC FUNCTION PANEL
AST: 23 U/L (ref 0–37)
Albumin: 3.9 g/dL (ref 3.5–5.2)
Total Protein: 7.2 g/dL (ref 6.0–8.3)

## 2011-12-25 LAB — BASIC METABOLIC PANEL
BUN: 17 mg/dL (ref 6–23)
CO2: 29 mEq/L (ref 19–32)
Calcium: 9.1 mg/dL (ref 8.4–10.5)
GFR: 71.12 mL/min (ref >60.00)
Glucose, Bld: 128 mg/dL — ABNORMAL HIGH (ref 70–99)

## 2011-12-25 LAB — LIPID PANEL
Cholesterol: 172 mg/dL (ref 0–200)
VLDL: 23 mg/dL (ref 0.0–40.0)

## 2011-12-25 LAB — HEMOGLOBIN A1C: Hgb A1c MFr Bld: 6.2 % (ref 4.6–6.5)

## 2012-01-01 ENCOUNTER — Ambulatory Visit (INDEPENDENT_AMBULATORY_CARE_PROVIDER_SITE_OTHER): Payer: 59 | Admitting: Internal Medicine

## 2012-01-01 ENCOUNTER — Encounter: Payer: Self-pay | Admitting: Internal Medicine

## 2012-01-01 VITALS — BP 126/80 | HR 68 | Temp 98.2°F | Resp 16 | Ht 64.0 in | Wt 208.0 lb

## 2012-01-01 DIAGNOSIS — I10 Essential (primary) hypertension: Secondary | ICD-10-CM

## 2012-01-01 DIAGNOSIS — Z0000 Encounter for general adult medical examination without abnormal findings: Secondary | ICD-10-CM | POA: Diagnosis not present

## 2012-01-01 NOTE — Patient Instructions (Signed)
The patient is instructed to continue all medications as prescribed. Schedule followup with check out clerk upon leaving the clinic  

## 2012-01-01 NOTE — Progress Notes (Signed)
  Subjective:    Patient ID: Erin Kirk, female    DOB: 12/18/1955, 56 y.o.   MRN: [id number]  HPI Pt presents for CPX Patient is a 56 year old white female who presents for followup of hypothyroidism mild hyperlipidemia hypertension and a primary hypercoagulable state.  She has been following a more careful diet and exercise program and that is demonstrated in her values today her lipids are at goal her CBC differential is excellent her blood count is good her hemoglobin A1c stable at 6.2   Review of Systems  Constitutional: Negative for activity change, appetite change and fatigue.  HENT: Negative for ear pain, congestion, neck pain, postnasal drip and sinus pressure.   Eyes: Negative for redness and visual disturbance.  Respiratory: Negative for cough, shortness of breath and wheezing.   Gastrointestinal: Negative for abdominal pain and abdominal distention.  Genitourinary: Negative for dysuria, frequency and menstrual problem.  Musculoskeletal: Negative for myalgias, joint swelling and arthralgias.  Skin: Negative for rash and wound.  Neurological: Negative for dizziness, weakness and headaches.  Hematological: Negative for adenopathy. Does not bruise/bleed easily.  Psychiatric/Behavioral: Negative for sleep disturbance and decreased concentration.       Objective:   Physical Exam  Nursing note and vitals reviewed. Constitutional: She is oriented to person, place, and time. She appears well-developed and well-nourished. No distress.  HENT:  Head: Normocephalic and atraumatic.  Right Ear: External ear normal.  Left Ear: External ear normal.  Nose: Nose normal.  Mouth/Throat: Oropharynx is clear and moist.  Eyes: Conjunctivae and EOM are normal. Pupils are equal, round, and reactive to light.  Neck: Normal range of motion. Neck supple. No JVD present. No tracheal deviation present. No thyromegaly present.  Cardiovascular: Normal rate, regular rhythm, normal heart sounds and  intact distal pulses.   No murmur heard. Pulmonary/Chest: Effort normal and breath sounds normal. She has no wheezes. She exhibits no tenderness.  Abdominal: Soft. Bowel sounds are normal.  Musculoskeletal: She exhibits edema and tenderness.  Lymphadenopathy:    She has no cervical adenopathy.  Neurological: She is alert and oriented to person, place, and time. She has normal reflexes. No cranial nerve deficit.  Skin: Skin is warm and dry. She is not diaphoretic.  Psychiatric: She has a normal mood and affect. Her behavior is normal.          Assessment & Plan:   This is a routine physical examination for this healthy  Female. Reviewed all health maintenance protocols including mammography colonoscopy bone density and reviewed appropriate screening labs. Her immunization history was reviewed as well as her current medications and allergies refills of her chronic medications were given and the plan for yearly health maintenance was discussed all orders and referrals were made as appropriate.

## 2012-01-22 ENCOUNTER — Ambulatory Visit: Payer: 59

## 2012-01-22 DIAGNOSIS — I82409 Acute embolism and thrombosis of unspecified deep veins of unspecified lower extremity: Secondary | ICD-10-CM

## 2012-01-22 LAB — POCT INR: INR: 2.3

## 2012-01-22 NOTE — Patient Instructions (Signed)
  Latest dosing instructions   Total Sun Mon Tue Wed Thu Fri Sat   35 5 mg 5 mg 5 mg 5 mg 5 mg 5 mg 5 mg    (5 mg1) (5 mg1) (5 mg1) (5 mg1) (5 mg1) (5 mg1) (5 mg1)        

## 2012-02-02 ENCOUNTER — Other Ambulatory Visit: Payer: Self-pay | Admitting: Internal Medicine

## 2012-02-19 ENCOUNTER — Encounter: Payer: 59 | Admitting: Family

## 2012-02-19 ENCOUNTER — Other Ambulatory Visit: Payer: Self-pay | Admitting: Internal Medicine

## 2012-02-22 ENCOUNTER — Encounter: Payer: 59 | Admitting: Family

## 2012-02-22 ENCOUNTER — Telehealth: Payer: Self-pay | Admitting: Family

## 2012-02-22 NOTE — Telephone Encounter (Signed)
Patient missed Coumadin Appointment today. Reschedule ASAP.

## 2012-02-23 DIAGNOSIS — M25569 Pain in unspecified knee: Secondary | ICD-10-CM | POA: Diagnosis not present

## 2012-02-23 DIAGNOSIS — M1710 Unilateral primary osteoarthritis, unspecified knee: Secondary | ICD-10-CM | POA: Diagnosis not present

## 2012-02-24 ENCOUNTER — Other Ambulatory Visit: Payer: Self-pay | Admitting: Internal Medicine

## 2012-02-24 NOTE — Telephone Encounter (Signed)
Please call patient to rescheduled coumadin appt.

## 2012-02-26 ENCOUNTER — Ambulatory Visit: Payer: 59 | Admitting: Family

## 2012-02-26 DIAGNOSIS — I82409 Acute embolism and thrombosis of unspecified deep veins of unspecified lower extremity: Secondary | ICD-10-CM

## 2012-02-26 LAB — POCT INR: INR: 2.1

## 2012-02-26 NOTE — Patient Instructions (Signed)
  Latest dosing instructions   Total Sun Mon Tue Wed Thu Fri Sat   35 5 mg 5 mg 5 mg 5 mg 5 mg 5 mg 5 mg    (5 mg1) (5 mg1) (5 mg1) (5 mg1) (5 mg1) (5 mg1) (5 mg1)        Take 2 tabs today. Then continue the same dosage.

## 2012-03-04 ENCOUNTER — Other Ambulatory Visit: Payer: Self-pay | Admitting: Internal Medicine

## 2012-03-11 ENCOUNTER — Ambulatory Visit (INDEPENDENT_AMBULATORY_CARE_PROVIDER_SITE_OTHER): Payer: 59 | Admitting: Family

## 2012-03-11 DIAGNOSIS — I82409 Acute embolism and thrombosis of unspecified deep veins of unspecified lower extremity: Secondary | ICD-10-CM | POA: Diagnosis not present

## 2012-03-11 DIAGNOSIS — D6859 Other primary thrombophilia: Secondary | ICD-10-CM

## 2012-03-11 NOTE — Patient Instructions (Addendum)
  Latest dosing instructions   Total Sun Mon Tue Wed Thu Fri Sat   35 5 mg 5 mg 5 mg 5 mg 5 mg 5 mg 5 mg    (5 mg1) (5 mg1) (5 mg1) (5 mg1) (5 mg1) (5 mg1) (5 mg1)       Hold Coumadin 1 day for teeth cleaning. One tablet a day. Then 1.5 tabs on Wednesdays only. Recheck in 2 weeks.

## 2012-03-25 ENCOUNTER — Ambulatory Visit (INDEPENDENT_AMBULATORY_CARE_PROVIDER_SITE_OTHER): Payer: 59 | Admitting: Family

## 2012-03-25 DIAGNOSIS — I82409 Acute embolism and thrombosis of unspecified deep veins of unspecified lower extremity: Secondary | ICD-10-CM

## 2012-03-25 LAB — POCT INR: INR: 3

## 2012-03-25 NOTE — Patient Instructions (Addendum)
One tablet a day. Then 1.5 tabs on Wednesdays only. Recheck in 4 weeks.   Latest dosing instructions   Total Sun Mon Tue Wed Thu Fri Sat   37.5 5 mg 5 mg 5 mg 7.5 mg 5 mg 5 mg 5 mg    (5 mg1) (5 mg1) (5 mg1) (5 mg1.5) (5 mg1) (5 mg1) (5 mg1)

## 2012-04-08 ENCOUNTER — Encounter: Payer: 59 | Admitting: Family

## 2012-04-20 ENCOUNTER — Other Ambulatory Visit: Payer: Self-pay | Admitting: Internal Medicine

## 2012-04-22 ENCOUNTER — Ambulatory Visit (INDEPENDENT_AMBULATORY_CARE_PROVIDER_SITE_OTHER): Payer: 59 | Admitting: Family

## 2012-04-22 DIAGNOSIS — I82409 Acute embolism and thrombosis of unspecified deep veins of unspecified lower extremity: Secondary | ICD-10-CM

## 2012-04-22 NOTE — Patient Instructions (Addendum)
  Latest dosing instructions   Total Sun Mon Tue Wed Thu Fri Sat   37.5 5 mg 5 mg 5 mg 7.5 mg 5 mg 5 mg 5 mg    (5 mg1) (5 mg1) (5 mg1) (5 mg1.5) (5 mg1) (5 mg1) (5 mg1)       One tablet a day. Then 1.5 tabs on Wednesdays only. Recheck in 6 weeks.

## 2012-04-29 DIAGNOSIS — C439 Malignant melanoma of skin, unspecified: Secondary | ICD-10-CM | POA: Diagnosis not present

## 2012-04-29 DIAGNOSIS — Z85820 Personal history of malignant melanoma of skin: Secondary | ICD-10-CM | POA: Diagnosis not present

## 2012-04-29 DIAGNOSIS — Z483 Aftercare following surgery for neoplasm: Secondary | ICD-10-CM | POA: Diagnosis not present

## 2012-04-29 DIAGNOSIS — Z1231 Encounter for screening mammogram for malignant neoplasm of breast: Secondary | ICD-10-CM | POA: Diagnosis not present

## 2012-05-02 ENCOUNTER — Ambulatory Visit (INDEPENDENT_AMBULATORY_CARE_PROVIDER_SITE_OTHER): Payer: 59 | Admitting: Internal Medicine

## 2012-05-02 ENCOUNTER — Encounter: Payer: Self-pay | Admitting: Internal Medicine

## 2012-05-02 VITALS — BP 136/80 | HR 72 | Temp 98.5°F | Resp 16 | Ht 64.5 in | Wt 212.0 lb

## 2012-05-02 DIAGNOSIS — I10 Essential (primary) hypertension: Secondary | ICD-10-CM

## 2012-05-02 DIAGNOSIS — E1165 Type 2 diabetes mellitus with hyperglycemia: Secondary | ICD-10-CM | POA: Diagnosis not present

## 2012-05-02 DIAGNOSIS — I4891 Unspecified atrial fibrillation: Secondary | ICD-10-CM

## 2012-05-02 DIAGNOSIS — E1169 Type 2 diabetes mellitus with other specified complication: Secondary | ICD-10-CM | POA: Diagnosis not present

## 2012-05-02 DIAGNOSIS — D6859 Other primary thrombophilia: Secondary | ICD-10-CM | POA: Diagnosis not present

## 2012-05-02 LAB — CBC WITH DIFFERENTIAL/PLATELET
Basophils Absolute: 0 10*3/uL (ref 0.0–0.1)
Eosinophils Absolute: 0.3 10*3/uL (ref 0.0–0.7)
HCT: 40.3 % (ref 36.0–46.0)
Lymphs Abs: 2.7 10*3/uL (ref 0.7–4.0)
MCV: 87.3 fl (ref 78.0–100.0)
Monocytes Absolute: 0.6 10*3/uL (ref 0.1–1.0)
Platelets: 222 10*3/uL (ref 150.0–400.0)
RDW: 14.6 % (ref 11.5–14.6)

## 2012-05-02 LAB — BASIC METABOLIC PANEL
BUN: 14 mg/dL (ref 6–23)
CO2: 33 mEq/L — ABNORMAL HIGH (ref 19–32)
Calcium: 8.8 mg/dL (ref 8.4–10.5)
Chloride: 100 mEq/L (ref 96–112)
Creatinine, Ser: 0.7 mg/dL (ref 0.4–1.2)

## 2012-05-02 NOTE — Patient Instructions (Signed)
The patient is instructed to continue all medications as prescribed. Schedule followup with check out clerk upon leaving the clinic  

## 2012-05-02 NOTE — Progress Notes (Signed)
Subjective:    Patient ID: Erin Kirk, female    DOB: 04-06-1956, 56 y.o.   MRN: [id number]  HPI His is a 56 year old female who presents for followup of hypertension history of osteoarthritis chronic anticoagulation with hypercoagulable syndrome.  She is also followed at [facility] for a history of skin cancer she underwent her screening examination with a CT scan which had good results no evidence of any metastatic skin cancer at this time.  The patient had to hold her metformin as well as take a dose of prednisone around receiving the dye for the CT scan there was a temporary bump up in her blood sugar which is normalized   Review of Systems  Constitutional: Negative for activity change, appetite change and fatigue.  HENT: Negative for ear pain, congestion, neck pain, postnasal drip and sinus pressure.   Eyes: Negative for redness and visual disturbance.  Respiratory: Negative for cough, shortness of breath and wheezing.   Gastrointestinal: Negative for abdominal pain and abdominal distention.  Genitourinary: Negative for dysuria, frequency and menstrual problem.  Musculoskeletal: Negative for myalgias, joint swelling and arthralgias.  Skin: Negative for rash and wound.  Neurological: Negative for dizziness, weakness and headaches.  Hematological: Negative for adenopathy. Does not bruise/bleed easily.  Psychiatric/Behavioral: Negative for disturbed wake/sleep cycle and decreased concentration.   Past Medical History  Diagnosis Date  . Thyroid disease   . GERD (gastroesophageal reflux disease)   . Hypertension   . Allergy   . Cancer     skin  . Primary hypercoagulable state     History   Social History  . Marital Status: Married    Spouse Name: N/A    Number of Children: N/A  . Years of Education: N/A   Occupational History  . Not on file.   Social History Main Topics  . Smoking status: Never Smoker   . Smokeless tobacco: Not on file  . Alcohol Use: No    . Drug Use: No  . Sexually Active: Yes   Other Topics Concern  . Not on file   Social History Narrative  . No narrative on file    Past Surgical History  Procedure Date  . Cholecystectomy   . Abdominal hysterectomy   . Oophorectomy     Family History  Problem Relation Age of Onset  . Coronary artery disease      fhx  . Diabetes Mother   . Hypertension Father   . Diabetes Father     Allergies  Allergen Reactions  . Amoxicillin-Pot Clavulanate     REACTION: unspecified    Current Outpatient Prescriptions on File Prior to Visit  Medication Sig Dispense Refill  . acetaminophen (TYLENOL) 325 MG tablet Take 650 mg by mouth every 6 (six) hours as needed.        . Ascorbic Acid (VITAMIN C) 500 MG tablet Take 500 mg by mouth daily.        . Calcium Citrate-Vitamin D 250-200 MG-UNIT TABS Take by mouth 2 (two) times daily.        Jennette Banker Sodium 30-100 MG CAPS Take by mouth daily.        Marland Kitchen COUMADIN 5 MG tablet TAKE AS DIRECTED  100 each  6  . CYMBALTA 60 MG capsule TAKE ONE CAPSULE BY MOUTH EVERY DAY  30 each  11  . diltiazem (CARDIZEM CD) 360 MG 24 hr capsule TAKE ONE CAPSULE BY MOUTH EVERY DAY  30 capsule  5  . DIOVAN 320  MG tablet TAKE ONE TABLET BY MOUTH EVERY DAY  30 each  11  . Doxepin HCl (SILENOR) 6 MG TABS Take by mouth as needed.        . folic acid (FOLVITE) 1 MG tablet TAKE ONE TABLET BY MOUTH EVERY DAY  30 tablet  11  . glucose blood test strip Use as instructed  100 each  12  . HYDROcodone-acetaminophen (VICODIN) 5-500 MG per tablet Take 1 tablet by mouth every 6 (six) hours as needed.        Marland Kitchen levothyroxine (SYNTHROID, LEVOTHROID) 100 MCG tablet TAKE ONE TABLET BY MOUTH EVERY DAY  30 tablet  6  . MAXZIDE-25 37.5-25 MG per tablet TAKE ONE TABLET BY MOUTH IN THE MORNING  30 each  11  . metoprolol succinate (TOPROL-XL) 25 MG 24 hr tablet TAKE ONE TABLET BY MOUTH EVERY DAY  90 tablet  3  . promethazine (PHENERGAN) 25 MG suppository Place 25 mg  rectally every 6 (six) hours as needed.        . ranitidine (ZANTAC) 300 MG tablet TAKE ONE TABLET BY MOUTH EVERY DAY AT BEDTIME  30 tablet  11  . SitaGLIPtin-MetFORMIN HCl (JANUMET XR) 50-1000 MG TB24 Take 1 tablet by mouth daily with breakfast.  30 tablet  11  . Vitamin D, Ergocalciferol, (DRISDOL) 50000 UNITS CAPS TAKE ONE CAPSULE BY MOUTH ONCE A WEEK.  5 capsule  3    BP 136/80  Pulse 72  Temp 98.5 F (36.9 C)  Resp 16  Ht 5' 4.5" (1.638 m)  Wt 212 lb (96.163 kg)  BMI 35.83 kg/m2       Objective:   Physical Exam  Constitutional: She is oriented to person, place, and time. She appears well-developed and well-nourished. No distress.  HENT:  Head: Normocephalic and atraumatic.  Right Ear: External ear normal.  Left Ear: External ear normal.  Nose: Nose normal.  Mouth/Throat: Oropharynx is clear and moist.  Eyes: Conjunctivae and EOM are normal. Pupils are equal, round, and reactive to light.  Neck: Normal range of motion. Neck supple. No JVD present. No tracheal deviation present. No thyromegaly present.  Cardiovascular: Normal rate, regular rhythm, normal heart sounds and intact distal pulses.   No murmur heard. Pulmonary/Chest: Effort normal and breath sounds normal. She has no wheezes. She exhibits no tenderness.  Abdominal: Soft. Bowel sounds are normal.  Musculoskeletal: Normal range of motion. She exhibits no edema and no tenderness.  Lymphadenopathy:    She has no cervical adenopathy.  Neurological: She is alert and oriented to person, place, and time. She has normal reflexes. No cranial nerve deficit.  Skin: Skin is warm and dry. She is not diaphoretic.  Psychiatric: She has a normal mood and affect. Her behavior is normal.          Assessment & Plan:  Doing very well in terms of her blood pressure and hypercoagulable syndrome with no evidence of any DVT development will monitor basic blood work today including a CBC differential basic metabolic panel and she is  due for hemoglobin A1c.  We will look at the A1c in light of recent bump up her blood glucose around the prednisone dose if there is a slight bump in A1c that might explain that.  Otherwise are primary intervention today as did have about weight loss centers are been a gradual weight gain over the last several visits we need to reverse that trend

## 2012-05-04 ENCOUNTER — Other Ambulatory Visit: Payer: Self-pay | Admitting: Internal Medicine

## 2012-05-14 ENCOUNTER — Other Ambulatory Visit: Payer: Self-pay | Admitting: Internal Medicine

## 2012-06-02 ENCOUNTER — Ambulatory Visit (INDEPENDENT_AMBULATORY_CARE_PROVIDER_SITE_OTHER): Payer: 59 | Admitting: Family

## 2012-06-02 DIAGNOSIS — I82409 Acute embolism and thrombosis of unspecified deep veins of unspecified lower extremity: Secondary | ICD-10-CM | POA: Diagnosis not present

## 2012-06-02 NOTE — Patient Instructions (Addendum)
Take an extra 1/2 tablet today only. One tablet a day except  Then 1.5 tabs on Wednesdays only. Recheck in 4 weeks.    Latest dosing instructions   Total Sun Mon Tue Wed Thu Fri Sat   37.5 5 mg 5 mg 5 mg 7.5 mg 5 mg 5 mg 5 mg    (5 mg1) (5 mg1) (5 mg1) (5 mg1.5) (5 mg1) (5 mg1) (5 mg1)

## 2012-06-23 ENCOUNTER — Telehealth: Payer: Self-pay | Admitting: Internal Medicine

## 2012-06-23 NOTE — Telephone Encounter (Signed)
Per dr Lovell Sheehan- may take mucinex cough and cold for 5 days-pt informed

## 2012-06-23 NOTE — Telephone Encounter (Signed)
Caller: Phillipa/Patient; Patient Name: Erin Kirk; PCP: Darryll Capers (Adults only); Best Callback Phone Number: [phone] LMP - Hysterectomy 98. Onset 06/22/12 Sore throat, sinus drainage 06/23/12 Afebrile still has sore throat and sinus drainage running down back of throat, facial pain with sinus headache and congestion.  Non productive with blowing and sinuses feel swollen.   Headache 10/10 took  2  325 mg tablets of tylenol at  0700 mild improvement.  Patient also unsure of what medication to take due to Diabetes and High blood pressure for cold symptoms and would like recommendation from provider. Urgent symptom of "Moderate to severe headache unrelieved by nonprescription medication" per Upper Respiratory Infection protocol.  Disposition Call Provider Immediately. Home care advice given.  OFFICE NOTE: DISPOSITON CALL PROVIDER IMMEDIATELY.  PLEASE FOLLOW UP WITH FURTHER INSTRUCTIONS OR APPOINTMENT TIME.

## 2012-06-29 ENCOUNTER — Other Ambulatory Visit: Payer: Self-pay | Admitting: Internal Medicine

## 2012-06-29 ENCOUNTER — Ambulatory Visit (INDEPENDENT_AMBULATORY_CARE_PROVIDER_SITE_OTHER): Payer: Medicare Other | Admitting: Family

## 2012-06-29 DIAGNOSIS — Z23 Encounter for immunization: Secondary | ICD-10-CM | POA: Diagnosis not present

## 2012-06-29 DIAGNOSIS — I82409 Acute embolism and thrombosis of unspecified deep veins of unspecified lower extremity: Secondary | ICD-10-CM | POA: Diagnosis not present

## 2012-06-29 LAB — POCT INR: INR: 2.4

## 2012-06-29 NOTE — Patient Instructions (Addendum)
Take an extra 1/2 tablet today only. One tablet a day except  Then 1.5 tabs on Wednesdays only. Recheck in 4 weeks.    Latest dosing instructions   Total Sun Mon Tue Wed Thu Fri Sat   37.5 5 mg 5 mg 5 mg 7.5 mg 5 mg 5 mg 5 mg    (5 mg1) (5 mg1) (5 mg1) (5 mg1.5) (5 mg1) (5 mg1) (5 mg1)        

## 2012-06-30 ENCOUNTER — Encounter: Payer: Medicare Other | Admitting: Family

## 2012-07-01 ENCOUNTER — Other Ambulatory Visit: Payer: Self-pay | Admitting: Internal Medicine

## 2012-07-12 ENCOUNTER — Other Ambulatory Visit: Payer: Self-pay | Admitting: Internal Medicine

## 2012-07-27 ENCOUNTER — Ambulatory Visit (INDEPENDENT_AMBULATORY_CARE_PROVIDER_SITE_OTHER): Payer: Medicare Other | Admitting: Family

## 2012-07-27 DIAGNOSIS — I82409 Acute embolism and thrombosis of unspecified deep veins of unspecified lower extremity: Secondary | ICD-10-CM | POA: Diagnosis not present

## 2012-07-27 LAB — POCT INR: INR: 2.3

## 2012-07-27 NOTE — Patient Instructions (Addendum)
1.5 tabs (7.5mg) on Wednesdays and Friday only. All other days, 1 tab (5mg). Recheck in 4 weeks.    Latest dosing instructions   Total Sun Mon Tue Wed Thu Fri Sat   40 5 mg 5 mg 5 mg 7.5 mg 5 mg 7.5 mg 5 mg    (5 mg1) (5 mg1) (5 mg1) (5 mg1.5) (5 mg1) (5 mg1.5) (5 mg1)        

## 2012-08-14 ENCOUNTER — Other Ambulatory Visit: Payer: Self-pay | Admitting: Internal Medicine

## 2012-09-01 ENCOUNTER — Ambulatory Visit (INDEPENDENT_AMBULATORY_CARE_PROVIDER_SITE_OTHER): Payer: 59 | Admitting: Internal Medicine

## 2012-09-01 ENCOUNTER — Encounter: Payer: Self-pay | Admitting: Internal Medicine

## 2012-09-01 ENCOUNTER — Ambulatory Visit (INDEPENDENT_AMBULATORY_CARE_PROVIDER_SITE_OTHER): Payer: 59 | Admitting: Family

## 2012-09-01 VITALS — BP 130/80 | HR 72 | Temp 98.2°F | Resp 16 | Ht 64.5 in | Wt 214.0 lb

## 2012-09-01 DIAGNOSIS — IMO0001 Reserved for inherently not codable concepts without codable children: Secondary | ICD-10-CM

## 2012-09-01 DIAGNOSIS — E039 Hypothyroidism, unspecified: Secondary | ICD-10-CM | POA: Diagnosis not present

## 2012-09-01 DIAGNOSIS — I10 Essential (primary) hypertension: Secondary | ICD-10-CM

## 2012-09-01 DIAGNOSIS — E663 Overweight: Secondary | ICD-10-CM

## 2012-09-01 DIAGNOSIS — I82409 Acute embolism and thrombosis of unspecified deep veins of unspecified lower extremity: Secondary | ICD-10-CM

## 2012-09-01 LAB — BASIC METABOLIC PANEL
BUN: 16 mg/dL (ref 6–23)
Calcium: 9.3 mg/dL (ref 8.4–10.5)
Chloride: 103 mEq/L (ref 96–112)
Creatinine, Ser: 0.9 mg/dL (ref 0.4–1.2)

## 2012-09-01 NOTE — Patient Instructions (Addendum)
1.5 tabs (7.5mg) on Wednesdays and Friday only. All other days, 1 tab (5mg). Recheck in 4 weeks.    Latest dosing instructions   Total Sun Mon Tue Wed Thu Fri Sat   40 5 mg 5 mg 5 mg 7.5 mg 5 mg 7.5 mg 5 mg    (5 mg1) (5 mg1) (5 mg1) (5 mg1.5) (5 mg1) (5 mg1.5) (5 mg1)        

## 2012-09-01 NOTE — Patient Instructions (Signed)
The patient is instructed to continue all medications as prescribed. Schedule followup with check out clerk upon leaving the clinic  

## 2012-09-01 NOTE — Progress Notes (Signed)
Subjective:    Patient ID: Erin Kirk, female    DOB: Dec 25, 1955, 56 y.o.   MRN: [id number]  HPI  Is a 56 year old female is followed for hyperlipidemia hypertension hypothyroidism and diabetes as well as a hypercoagulable syndrome.  She is stable from the standpoint of most of her diagnoses but her weight has gradually increased over this year.  We talked about diet and exercise as well as the impact of weight gain on diabetic control  Review of Systems  Constitutional: Negative for activity change, appetite change and fatigue.  HENT: Negative for ear pain, congestion, neck pain, postnasal drip and sinus pressure.   Eyes: Negative for redness and visual disturbance.  Respiratory: Negative for cough, shortness of breath and wheezing.   Gastrointestinal: Negative for abdominal pain and abdominal distention.  Genitourinary: Negative for dysuria, frequency and menstrual problem.  Musculoskeletal: Negative for myalgias, joint swelling and arthralgias.  Skin: Negative for rash and wound.  Neurological: Negative for dizziness, weakness and headaches.  Hematological: Negative for adenopathy. Does not bruise/bleed easily.  Psychiatric/Behavioral: Negative for sleep disturbance and decreased concentration.   Past Medical History  Diagnosis Date  . Thyroid disease   . GERD (gastroesophageal reflux disease)   . Hypertension   . Allergy   . Cancer     skin  . Primary hypercoagulable state     History   Social History  . Marital Status: Married    Spouse Name: N/A    Number of Children: N/A  . Years of Education: N/A   Occupational History  . Not on file.   Social History Main Topics  . Smoking status: Never Smoker   . Smokeless tobacco: Not on file  . Alcohol Use: No  . Drug Use: No  . Sexually Active: Yes   Other Topics Concern  . Not on file   Social History Narrative  . No narrative on file    Past Surgical History  Procedure Date  . Cholecystectomy   .  Abdominal hysterectomy   . Oophorectomy     Family History  Problem Relation Age of Onset  . Coronary artery disease      fhx  . Diabetes Mother   . Hypertension Father   . Diabetes Father     Allergies  Allergen Reactions  . Amoxicillin-Pot Clavulanate     REACTION: unspecified    Current Outpatient Prescriptions on File Prior to Visit  Medication Sig Dispense Refill  . acetaminophen (TYLENOL) 325 MG tablet Take 650 mg by mouth every 6 (six) hours as needed.        . Ascorbic Acid (VITAMIN C) 500 MG tablet Take 500 mg by mouth daily.        . Calcium Citrate-Vitamin D 250-200 MG-UNIT TABS Take by mouth 2 (two) times daily.        Jennette Banker Sodium 30-100 MG CAPS Take by mouth daily.        Marland Kitchen COUMADIN 5 MG tablet TAKE AS DIRECTED  100 tablet  6  . CYMBALTA 60 MG capsule TAKE ONE CAPSULE BY MOUTH EVERY DAY  30 each  11  . diltiazem (CARDIZEM CD) 360 MG 24 hr capsule TAKE ONE CAPSULE BY MOUTH EVERY DAY  30 capsule  5  . DIOVAN 320 MG tablet TAKE ONE TABLET BY MOUTH EVERY DAY  30 each  11  . Doxepin HCl (SILENOR) 6 MG TABS Take by mouth as needed.        . folic acid (FOLVITE)  1 MG tablet TAKE ONE TABLET BY MOUTH EVERY DAY  30 tablet  6  . glucose blood test strip Use as instructed  100 each  12  . HYDROcodone-acetaminophen (VICODIN) 5-500 MG per tablet Take 1 tablet by mouth every 6 (six) hours as needed.        Marland Kitchen JANUMET XR 50-1000 MG TB24 TAKE ONE TABLET BY MOUTH EVERY DAY WITH BREAKFAST  30 tablet  10  . levothyroxine (SYNTHROID, LEVOTHROID) 100 MCG tablet TAKE ONE TABLET BY MOUTH EVERY DAY  30 tablet  5  . metoprolol succinate (TOPROL-XL) 25 MG 24 hr tablet TAKE ONE TABLET BY MOUTH EVERY DAY  90 tablet  3  . promethazine (PHENERGAN) 25 MG suppository Place 25 mg rectally every 6 (six) hours as needed.        . ranitidine (ZANTAC) 300 MG tablet TAKE ONE TABLET BY MOUTH EVERY DAY AT BEDTIME  30 tablet  11  . triamterene-hydrochlorothiazide (MAXZIDE-25) 37.5-25 MG  per tablet TAKE ONE TABLET BY MOUTH IN THE MORNING  30 tablet  6  . Vitamin D, Ergocalciferol, (DRISDOL) 50000 UNITS CAPS TAKE ONE CAPSULE BY MOUTH ONCE A WEEK  5 capsule  0    BP 130/80  Pulse 72  Temp 98.2 F (36.8 C)  Resp 16  Ht 5' 4.5" (1.638 m)  Wt 214 lb (97.07 kg)  BMI 36.17 kg/m2       Objective:   Physical Exam  Nursing note and vitals reviewed. Constitutional: She is oriented to person, place, and time. She appears well-developed and well-nourished. No distress.  HENT:  Head: Normocephalic and atraumatic.  Right Ear: External ear normal.  Left Ear: External ear normal.  Nose: Nose normal.  Mouth/Throat: Oropharynx is clear and moist.  Eyes: Conjunctivae normal and EOM are normal. Pupils are equal, round, and reactive to light.  Neck: Normal range of motion. Neck supple. No JVD present. No tracheal deviation present. No thyromegaly present.  Cardiovascular: Normal rate, regular rhythm and intact distal pulses.   Murmur heard. Pulmonary/Chest: Effort normal and breath sounds normal. She has no wheezes. She exhibits no tenderness.  Abdominal: Soft. Bowel sounds are normal.  Musculoskeletal: Normal range of motion. She exhibits no edema and no tenderness.  Lymphadenopathy:    She has no cervical adenopathy.  Neurological: She is alert and oriented to person, place, and time. She has normal reflexes. No cranial nerve deficit.  Skin: Skin is warm and dry. She is not diaphoretic.  Psychiatric: She has a normal mood and affect. Her behavior is normal.          Assessment & Plan:  Basic metabolic panel and a hemoglobin A1c will be drawn today for monitoring blood pressure stable her current medications although problems are stable per protocol

## 2012-09-17 ENCOUNTER — Other Ambulatory Visit: Payer: Self-pay | Admitting: Internal Medicine

## 2012-09-24 ENCOUNTER — Ambulatory Visit (HOSPITAL_COMMUNITY)
Admission: RE | Admit: 2012-09-24 | Discharge: 2012-09-24 | Disposition: A | Discharge status: Home / Self Care | Payer: Medicare Other | Source: Ambulatory Visit | Attending: Internal Medicine | Admitting: Internal Medicine

## 2012-09-24 ENCOUNTER — Encounter: Payer: Self-pay | Admitting: Internal Medicine

## 2012-09-24 ENCOUNTER — Ambulatory Visit (INDEPENDENT_AMBULATORY_CARE_PROVIDER_SITE_OTHER): Payer: 59 | Admitting: Internal Medicine

## 2012-09-24 VITALS — BP 110/72 | HR 68 | Temp 98.5°F | Resp 18 | Wt 213.1 lb

## 2012-09-24 DIAGNOSIS — R51 Headache: Secondary | ICD-10-CM

## 2012-09-24 DIAGNOSIS — M26609 Unspecified temporomandibular joint disorder, unspecified side: Secondary | ICD-10-CM | POA: Diagnosis not present

## 2012-09-24 DIAGNOSIS — K1370 Unspecified lesions of oral mucosa: Secondary | ICD-10-CM | POA: Diagnosis not present

## 2012-09-24 DIAGNOSIS — J320 Chronic maxillary sinusitis: Secondary | ICD-10-CM | POA: Diagnosis not present

## 2012-09-24 DIAGNOSIS — R519 Headache, unspecified: Secondary | ICD-10-CM | POA: Insufficient documentation

## 2012-09-24 MED ORDER — GABAPENTIN 100 MG PO CAPS: 100.0000 mg | ORAL_CAPSULE | Freq: Three times a day (TID) | ORAL | Status: DC | PRN

## 2012-09-24 NOTE — Assessment & Plan Note (Signed)
57 year old white female with history of right-sided facial melanoma complains of intermittent sharp left-sided facial pain. Her dental exam is normal. However we cannot rule out possible dental abscess. Obtain CT of sinuses without contrast. Patient may have trigeminal neuralgia. Start gabapentin 100 mg 3 times a day as needed. Patient advised to followup with her primary care physician within 2 weeks.

## 2012-09-24 NOTE — Progress Notes (Signed)
Subjective:    Patient ID: Erin Kirk, female    DOB: Aug 09, 1956, 57 y.o.   MRN: [id number]  HPI  57 year old white female with history of right facial melanoma, type 2 diabetes and hypercoagulable disorder complains of left facial pain for last several weeks. Patient reports seeing her dentist on December 17. During dental cleaning they noticed white spots along her gum line and right buccal mucosa. Dentist advised patient to followup with oncologist. She was seen at [facility]. Oncologist was not overly concerned with oral lesions.  Although her left facial pain can be triggered with eating, sometimes facial pain does not have trigger. Pain can range from mild to severe. She denies any left ear pain or TMJ pain.  Review of Systems Negative for fever or chills.  No facial numbness  Past Medical History  Diagnosis Date  . Thyroid disease   . GERD (gastroesophageal reflux disease)   . Hypertension   . Allergy   . Cancer     skin  . Primary hypercoagulable state     History   Social History  . Marital Status: Married    Spouse Name: N/A    Number of Children: N/A  . Years of Education: N/A   Occupational History  . Not on file.   Social History Main Topics  . Smoking status: Never Smoker   . Smokeless tobacco: Not on file  . Alcohol Use: No  . Drug Use: No  . Sexually Active: Yes   Other Topics Concern  . Not on file   Social History Narrative  . No narrative on file    Past Surgical History  Procedure Date  . Cholecystectomy   . Abdominal hysterectomy   . Oophorectomy     Family History  Problem Relation Age of Onset  . Coronary artery disease      fhx  . Diabetes Mother   . Hypertension Father   . Diabetes Father     Allergies  Allergen Reactions  . Amoxicillin-Pot Clavulanate     REACTION: unspecified    Current Outpatient Prescriptions on File Prior to Visit  Medication Sig Dispense Refill  . acetaminophen (TYLENOL) 325 MG  tablet Take 650 mg by mouth every 6 (six) hours as needed.        . Ascorbic Acid (VITAMIN C) 500 MG tablet Take 500 mg by mouth daily.        . Calcium Citrate-Vitamin D 250-200 MG-UNIT TABS Take by mouth 2 (two) times daily.        Jennette Banker Sodium 30-100 MG CAPS Take by mouth daily.        Marland Kitchen COUMADIN 5 MG tablet TAKE AS DIRECTED  100 tablet  6  . CYMBALTA 60 MG capsule TAKE ONE CAPSULE BY MOUTH EVERY DAY  30 each  11  . diltiazem (CARDIZEM CD) 360 MG 24 hr capsule TAKE ONE CAPSULE BY MOUTH EVERY DAY  30 capsule  5  . DIOVAN 320 MG tablet TAKE ONE TABLET BY MOUTH EVERY DAY  30 each  11  . Doxepin HCl (SILENOR) 6 MG TABS Take by mouth as needed.        . folic acid (FOLVITE) 1 MG tablet TAKE ONE TABLET BY MOUTH EVERY DAY  30 tablet  6  . HYDROcodone-acetaminophen (VICODIN) 5-500 MG per tablet Take 1 tablet by mouth every 6 (six) hours as needed.        Marland Kitchen JANUMET XR 50-1000 MG TB24 TAKE ONE TABLET  BY MOUTH EVERY DAY WITH BREAKFAST  30 tablet  10  . levothyroxine (SYNTHROID, LEVOTHROID) 100 MCG tablet TAKE ONE TABLET BY MOUTH EVERY DAY  30 tablet  5  . metoprolol succinate (TOPROL-XL) 25 MG 24 hr tablet TAKE ONE TABLET BY MOUTH EVERY DAY  90 tablet  3  . promethazine (PHENERGAN) 25 MG suppository Place 25 mg rectally every 6 (six) hours as needed.        . ranitidine (ZANTAC) 300 MG tablet TAKE ONE TABLET BY MOUTH EVERY DAY AT BEDTIME  30 tablet  11  . triamterene-hydrochlorothiazide (MAXZIDE-25) 37.5-25 MG per tablet TAKE ONE TABLET BY MOUTH IN THE MORNING  30 tablet  6  . Vitamin D, Ergocalciferol, (DRISDOL) 50000 UNITS CAPS TAKE ONE CAPSULE BY MOUTH ONCE A WEEK  5 capsule  0  . gabapentin (NEURONTIN) 100 MG capsule Take 1 capsule (100 mg total) by mouth 3 (three) times daily as needed.  90 capsule  0    BP 110/72  Pulse 68  Temp 98.5 F (36.9 C) (Oral)  Resp 18  Wt 213 lb 1.9 oz (96.671 kg)       Objective:   Physical Exam  Constitutional: She is oriented to person,  place, and time. She appears well-developed and well-nourished.  HENT:  Head: Normocephalic and atraumatic.  Right Ear: External ear normal.  Left Ear: External ear normal.       Lacy white lesion right buccal mucosa.  Left lower gum line also with white appearance  Cardiovascular: Normal rate, regular rhythm and normal heart sounds.   Pulmonary/Chest: Effort normal and breath sounds normal. She has no wheezes.  Musculoskeletal:       No left TMJ tenderness  Neurological: She is alert and oriented to person, place, and time. No cranial nerve deficit.  Skin: Skin is warm and dry.  Psychiatric: She has a normal mood and affect. Her behavior is normal.          Assessment & Plan:

## 2012-09-24 NOTE — Patient Instructions (Signed)
Follow up with Dr. Lovell Sheehan within 2 weeks

## 2012-09-24 NOTE — Assessment & Plan Note (Signed)
Her dentist is concerned patient has white lacy lesion on right buccal mucosa and also along her left lower gum line. Symptoms may be secondary to lichen planus. However we cannot rule out oral cancer. Refer to Dr. Ezzard Standing (ENT) for further evaluation.

## 2012-09-26 ENCOUNTER — Other Ambulatory Visit: Payer: Self-pay | Admitting: Internal Medicine

## 2012-09-26 DIAGNOSIS — J329 Chronic sinusitis, unspecified: Secondary | ICD-10-CM

## 2012-09-26 MED ORDER — CEFUROXIME AXETIL 500 MG PO TABS: 500.0000 mg | ORAL_TABLET | Freq: Two times a day (BID) | ORAL | Status: DC

## 2012-09-29 ENCOUNTER — Ambulatory Visit (INDEPENDENT_AMBULATORY_CARE_PROVIDER_SITE_OTHER): Payer: Medicare Other | Admitting: Family

## 2012-09-29 DIAGNOSIS — I82409 Acute embolism and thrombosis of unspecified deep veins of unspecified lower extremity: Secondary | ICD-10-CM | POA: Diagnosis not present

## 2012-09-29 LAB — POCT INR: INR: 3

## 2012-09-29 NOTE — Patient Instructions (Addendum)
1.5 tabs (7.5mg ) on Wednesdays and Friday only. All other days, 1 tab (5mg ). Recheck in 4 weeks.    Latest dosing instructions   Total Sun Mon Tue Wed Thu Fri Sat   40 5 mg 5 mg 5 mg 7.5 mg 5 mg 7.5 mg 5 mg    (5 mg1) (5 mg1) (5 mg1) (5 mg1.5) (5 mg1) (5 mg1.5) (5 mg1)

## 2012-10-03 DIAGNOSIS — R51 Headache: Secondary | ICD-10-CM | POA: Diagnosis not present

## 2012-10-06 ENCOUNTER — Ambulatory Visit (INDEPENDENT_AMBULATORY_CARE_PROVIDER_SITE_OTHER): Payer: Medicare Other | Admitting: Internal Medicine

## 2012-10-06 ENCOUNTER — Encounter: Payer: Self-pay | Admitting: Internal Medicine

## 2012-10-06 VITALS — BP 120/84 | HR 76 | Temp 98.7°F | Wt 212.0 lb

## 2012-10-06 DIAGNOSIS — R51 Headache: Secondary | ICD-10-CM | POA: Diagnosis not present

## 2012-10-06 DIAGNOSIS — K1370 Unspecified lesions of oral mucosa: Secondary | ICD-10-CM

## 2012-10-06 MED ORDER — BETAMETHASONE DIPROPIONATE AUG 0.05 % EX GEL: Freq: Three times a day (TID) | CUTANEOUS | Status: DC

## 2012-10-06 NOTE — Assessment & Plan Note (Signed)
Patient evaluated by Dr. Ezzard Standing. He did not feel areas appeared malignant. Question lichen planus. Trial of topical steroids. Reassess in 2 weeks.

## 2012-10-06 NOTE — Progress Notes (Signed)
Subjective:    Patient ID: Erin Kirk, female    DOB: Oct 23, 1955, 57 y.o.   MRN: [id number]  HPI  57 year old white female for followup regarding left facial pain.  Patient seen during Saturday clinic.  CT of sinuses revealed focal mucosal thickening Inferior aspect of left maxillary sinus. Radiologist thought this may be area of sinusitis versus retention cyst. She was seen by Dr. Ezzard Standing. He did not feel patient's symptoms consistent with chronic sinusitis. He advised her that her symptoms may be related to dental issues. She was prescribed course of cefuroxime. Patient's left facial pain has significantly improved. However she still has sensitivity to temperatures on left side.  Dr. Ezzard Standing also evaluated white lacy spots on right buccal mucosa and left lower gumline. Patient reports he was not sure of the diagnosis but did not feel it was malignancy. Area was not biopsied. She has followup with her oncologist at [facility].  Review of Systems Negative for fever or chills  Past Medical History  Diagnosis Date  . Thyroid disease   . GERD (gastroesophageal reflux disease)   . Hypertension   . Allergy   . Cancer     skin  . Primary hypercoagulable state     History   Social History  . Marital Status: Married    Spouse Name: N/A    Number of Children: N/A  . Years of Education: N/A   Occupational History  . Not on file.   Social History Main Topics  . Smoking status: Never Smoker   . Smokeless tobacco: Not on file  . Alcohol Use: No  . Drug Use: No  . Sexually Active: Yes   Other Topics Concern  . Not on file   Social History Narrative  . No narrative on file    Past Surgical History  Procedure Date  . Cholecystectomy   . Abdominal hysterectomy   . Oophorectomy     Family History  Problem Relation Age of Onset  . Coronary artery disease      fhx  . Diabetes Mother   . Hypertension Father   . Diabetes Father     Allergies  Allergen Reactions  . Iohexol  Anaphylaxis, Hives and Shortness Of Breath    Incident 1990, premedicated prior to obtaining IV contrast since.  . Amoxicillin-Pot Clavulanate     REACTION: unspecified    Current Outpatient Prescriptions on File Prior to Visit  Medication Sig Dispense Refill  . acetaminophen (TYLENOL) 325 MG tablet Take 650 mg by mouth every 6 (six) hours as needed.        . Ascorbic Acid (VITAMIN C) 500 MG tablet Take 500 mg by mouth daily.        . Calcium Citrate-Vitamin D 250-200 MG-UNIT TABS Take by mouth 2 (two) times daily.        Jennette Banker Sodium 30-100 MG CAPS Take by mouth daily.        . cefUROXime (CEFTIN) 500 MG tablet Take 1 tablet (500 mg total) by mouth 2 (two) times daily.  20 tablet  0  . COUMADIN 5 MG tablet TAKE AS DIRECTED  100 tablet  6  . CYMBALTA 60 MG capsule TAKE ONE CAPSULE BY MOUTH EVERY DAY  30 each  11  . diltiazem (CARDIZEM CD) 360 MG 24 hr capsule TAKE ONE CAPSULE BY MOUTH EVERY DAY  30 capsule  5  . DIOVAN 320 MG tablet TAKE ONE TABLET BY MOUTH EVERY DAY  30 each  11  .  Doxepin HCl (SILENOR) 6 MG TABS Take by mouth as needed.        . folic acid (FOLVITE) 1 MG tablet TAKE ONE TABLET BY MOUTH EVERY DAY  30 tablet  6  . gabapentin (NEURONTIN) 100 MG capsule Take 1 capsule (100 mg total) by mouth 3 (three) times daily as needed.  90 capsule  0  . HYDROcodone-acetaminophen (VICODIN) 5-500 MG per tablet Take 1 tablet by mouth every 6 (six) hours as needed.        Marland Kitchen JANUMET XR 50-1000 MG TB24 TAKE ONE TABLET BY MOUTH EVERY DAY WITH BREAKFAST  30 tablet  10  . levothyroxine (SYNTHROID, LEVOTHROID) 100 MCG tablet TAKE ONE TABLET BY MOUTH EVERY DAY  30 tablet  5  . metoprolol succinate (TOPROL-XL) 25 MG 24 hr tablet TAKE ONE TABLET BY MOUTH EVERY DAY  90 tablet  3  . promethazine (PHENERGAN) 25 MG suppository Place 25 mg rectally every 6 (six) hours as needed.        . ranitidine (ZANTAC) 300 MG tablet TAKE ONE TABLET BY MOUTH EVERY DAY AT BEDTIME  30 tablet  11  .  triamterene-hydrochlorothiazide (MAXZIDE-25) 37.5-25 MG per tablet TAKE ONE TABLET BY MOUTH IN THE MORNING  30 tablet  6  . Vitamin D, Ergocalciferol, (DRISDOL) 50000 UNITS CAPS TAKE ONE CAPSULE BY MOUTH ONCE A WEEK  5 capsule  0    BP 120/84  Pulse 76  Temp 98.7 F (37.1 C) (Oral)  Wt 212 lb (96.163 kg)       Objective:   Physical Exam  Constitutional: She appears well-developed and well-nourished.  Cardiovascular: Normal rate and regular rhythm.   No murmur heard. Pulmonary/Chest: Effort normal and breath sounds normal. She has no wheezes.  HEENT: Lacy white lesion right buccal mucosa. Left lower gum line also with white appearance          Assessment & Plan:

## 2012-10-06 NOTE — Assessment & Plan Note (Signed)
CT of sinuses showed focal area of mucosal thickening of left inferior maxillary sinus. She was treated with course of cefuroxime 500 mg twice daily. Left facial pain significantly improved. She was seen by Dr. Macie Burows.  He did not feel her symptoms secondary chronic sinusitis but may be related to upper dental issues. She has oral sensitivity to temperature. She plans to follow up with her oral surgeon/dentist.

## 2012-10-07 ENCOUNTER — Telehealth: Payer: Self-pay | Admitting: Internal Medicine

## 2012-10-07 NOTE — Telephone Encounter (Signed)
Opened in error/kjh 

## 2012-10-07 NOTE — Telephone Encounter (Signed)
Pt states the MD prescribed betamethasone, augmented, (DIPROLENE) 0.05 % gel to pt. It states DO NOT PUT IN YOUR MOUTH, EXTERNAL USE ONLY!  Pharmacist could not advise. Pt does not feel comfortable w/ this med. Also states other side effect w. her diabetes pertaining to this med. Pt would appreciate call back as soon as possible.  Pt would like to try something else.

## 2012-10-15 ENCOUNTER — Other Ambulatory Visit: Payer: Self-pay | Admitting: Internal Medicine

## 2012-10-27 ENCOUNTER — Ambulatory Visit (INDEPENDENT_AMBULATORY_CARE_PROVIDER_SITE_OTHER): Payer: Medicare Other | Admitting: Family

## 2012-10-27 DIAGNOSIS — I82409 Acute embolism and thrombosis of unspecified deep veins of unspecified lower extremity: Secondary | ICD-10-CM | POA: Diagnosis not present

## 2012-10-27 LAB — POCT INR: INR: 2.8

## 2012-10-27 NOTE — Patient Instructions (Addendum)
1.5 tabs (7.5mg ) on Wednesdays and Friday only. All other days, 1 tab (5mg ). Recheck in 6 weeks.    Latest dosing instructions   Total Sun Mon Tue Wed Thu Fri Sat   40 5 mg 5 mg 5 mg 7.5 mg 5 mg 7.5 mg 5 mg    (5 mg1) (5 mg1) (5 mg1) (5 mg1.5) (5 mg1) (5 mg1.5) (5 mg1)

## 2012-11-01 ENCOUNTER — Other Ambulatory Visit: Payer: Self-pay | Admitting: Internal Medicine

## 2012-12-08 ENCOUNTER — Ambulatory Visit (INDEPENDENT_AMBULATORY_CARE_PROVIDER_SITE_OTHER): Payer: Medicare Other | Admitting: Family

## 2012-12-08 DIAGNOSIS — I82409 Acute embolism and thrombosis of unspecified deep veins of unspecified lower extremity: Secondary | ICD-10-CM

## 2012-12-08 NOTE — Patient Instructions (Addendum)
1.5 tabs (7.5mg ) on Wednesdays and Friday only. All other days, 1 tab (5mg ). Recheck in 6 weeks.  Anticoagulation Dose Instructions as of 12/08/2012     Glynis Smiles Tue Wed Thu Fri Sat   New Dose 5 mg 5 mg 5 mg 7.5 mg 5 mg 7.5 mg 5 mg    Description       1.5 tabs (7.5mg ) on Wednesdays and Friday only. All other days, 1 tab (5mg ). Recheck in 6 weeks.

## 2012-12-13 ENCOUNTER — Other Ambulatory Visit: Payer: Self-pay | Admitting: Internal Medicine

## 2012-12-31 ENCOUNTER — Other Ambulatory Visit: Payer: Self-pay | Admitting: Internal Medicine

## 2013-01-02 ENCOUNTER — Ambulatory Visit: Payer: Medicare Other | Admitting: Internal Medicine

## 2013-01-04 ENCOUNTER — Encounter: Payer: Self-pay | Admitting: Internal Medicine

## 2013-01-04 ENCOUNTER — Ambulatory Visit (INDEPENDENT_AMBULATORY_CARE_PROVIDER_SITE_OTHER): Payer: 59 | Admitting: Internal Medicine

## 2013-01-04 VITALS — BP 124/80 | HR 72 | Temp 98.3°F | Resp 16 | Ht 64.5 in | Wt 206.0 lb

## 2013-01-04 DIAGNOSIS — D6859 Other primary thrombophilia: Secondary | ICD-10-CM

## 2013-01-04 DIAGNOSIS — IMO0001 Reserved for inherently not codable concepts without codable children: Secondary | ICD-10-CM

## 2013-01-04 DIAGNOSIS — E039 Hypothyroidism, unspecified: Secondary | ICD-10-CM | POA: Diagnosis not present

## 2013-01-04 DIAGNOSIS — I4891 Unspecified atrial fibrillation: Secondary | ICD-10-CM

## 2013-01-04 DIAGNOSIS — E785 Hyperlipidemia, unspecified: Secondary | ICD-10-CM

## 2013-01-04 DIAGNOSIS — E119 Type 2 diabetes mellitus without complications: Secondary | ICD-10-CM | POA: Diagnosis not present

## 2013-01-04 LAB — TSH: TSH: 0.93 u[IU]/mL (ref 0.35–5.50)

## 2013-01-04 NOTE — Patient Instructions (Addendum)
The patient is instructed to continue all medications as prescribed. Schedule followup with check out clerk upon leaving the clinic  

## 2013-01-04 NOTE — Progress Notes (Signed)
  Subjective:    Patient ID: Erin Kirk, female    DOB: [birth year], 57 y.o.   MRN: [id number]  HPI The patient has been under stress and has not exercised but has lost 6 lbs No swelling or evidence of clotting on exam HTN stable AF stable   Review of Systems     Objective:   Physical Exam        Assessment & Plan:  Hypercoagulable syndrome with lupus anti-coagulant on anticoagulant therapy as well as a history of atrial fibrillation hypertension and hypothyroidism.  Weight loss has been primary strategy for lowering risk of diabetes Continue treatment with  janumet xr

## 2013-01-10 ENCOUNTER — Other Ambulatory Visit: Payer: Self-pay | Admitting: Internal Medicine

## 2013-01-19 ENCOUNTER — Ambulatory Visit (INDEPENDENT_AMBULATORY_CARE_PROVIDER_SITE_OTHER): Payer: Medicare Other | Admitting: Family

## 2013-01-19 DIAGNOSIS — I82409 Acute embolism and thrombosis of unspecified deep veins of unspecified lower extremity: Secondary | ICD-10-CM | POA: Diagnosis not present

## 2013-01-19 NOTE — Patient Instructions (Signed)
1.5 tabs (7.5mg ) on Wednesdays and Friday only. All other days, 1 tab (5mg ). Recheck in 6 weeks.  Anticoagulation Dose Instructions as of 01/19/2013     Glynis Smiles Tue Wed Thu Fri Sat   New Dose 5 mg 5 mg 5 mg 7.5 mg 5 mg 7.5 mg 5 mg    Description       1.5 tabs (7.5mg ) on Wednesdays and Friday only. All other days, 1 tab (5mg ). Recheck in 6 weeks.

## 2013-02-07 ENCOUNTER — Other Ambulatory Visit: Payer: Self-pay | Admitting: Internal Medicine

## 2013-02-10 ENCOUNTER — Other Ambulatory Visit: Payer: Self-pay | Admitting: *Deleted

## 2013-02-10 MED ORDER — METOPROLOL SUCCINATE ER 25 MG PO TB24: ORAL_TABLET | ORAL | Status: DC

## 2013-02-15 ENCOUNTER — Other Ambulatory Visit: Payer: Self-pay | Admitting: Internal Medicine

## 2013-02-28 ENCOUNTER — Other Ambulatory Visit: Payer: Self-pay | Admitting: Internal Medicine

## 2013-02-28 ENCOUNTER — Ambulatory Visit (INDEPENDENT_AMBULATORY_CARE_PROVIDER_SITE_OTHER): Payer: Medicare Other | Admitting: Family

## 2013-02-28 DIAGNOSIS — I82409 Acute embolism and thrombosis of unspecified deep veins of unspecified lower extremity: Secondary | ICD-10-CM | POA: Diagnosis not present

## 2013-02-28 NOTE — Patient Instructions (Addendum)
Eat plenty of green veggies this week. 1.5 tabs (7.5mg ) on Wednesdays and Fridays only. All other days, 1 tab (5mg ). Recheck in 6 weeks  Anticoagulation Dose Instructions as of 02/28/2013     Glynis Smiles Tue Wed Thu Fri Sat   New Dose 5 mg 5 mg 5 mg 7.5 mg 5 mg 7.5 mg 5 mg    Description       Eat plenty of green veggies this week. 1.5 tabs (7.5mg ) on Wednesdays and Fridays only. All other days, 1 tab (5mg ). Recheck in 6 weeks.

## 2013-03-02 ENCOUNTER — Encounter: Payer: Medicare Other | Admitting: Family

## 2013-03-10 ENCOUNTER — Other Ambulatory Visit: Payer: Self-pay | Admitting: *Deleted

## 2013-03-10 MED ORDER — TRIAMTERENE-HCTZ 37.5-25 MG PO TABS: ORAL_TABLET | ORAL | Status: DC

## 2013-03-10 MED ORDER — LEVOTHYROXINE SODIUM 100 MCG PO TABS: ORAL_TABLET | ORAL | Status: DC

## 2013-03-20 ENCOUNTER — Other Ambulatory Visit: Payer: Self-pay | Admitting: *Deleted

## 2013-03-20 ENCOUNTER — Other Ambulatory Visit: Payer: Self-pay | Admitting: Internal Medicine

## 2013-03-20 MED ORDER — RANITIDINE HCL 300 MG PO TABS: ORAL_TABLET | ORAL | Status: DC

## 2013-04-03 ENCOUNTER — Ambulatory Visit (INDEPENDENT_AMBULATORY_CARE_PROVIDER_SITE_OTHER): Payer: 59 | Admitting: General Practice

## 2013-04-03 DIAGNOSIS — I82409 Acute embolism and thrombosis of unspecified deep veins of unspecified lower extremity: Secondary | ICD-10-CM | POA: Diagnosis not present

## 2013-04-03 LAB — POCT INR: INR: 4

## 2013-04-10 ENCOUNTER — Ambulatory Visit: Payer: Medicare Other

## 2013-04-11 ENCOUNTER — Encounter: Payer: Medicare Other | Admitting: Family

## 2013-04-17 ENCOUNTER — Ambulatory Visit (INDEPENDENT_AMBULATORY_CARE_PROVIDER_SITE_OTHER): Payer: 59 | Admitting: General Practice

## 2013-04-17 DIAGNOSIS — I82409 Acute embolism and thrombosis of unspecified deep veins of unspecified lower extremity: Secondary | ICD-10-CM | POA: Diagnosis not present

## 2013-04-17 LAB — POCT INR: INR: 3.3

## 2013-05-15 ENCOUNTER — Ambulatory Visit: Payer: Medicare Other

## 2013-05-18 ENCOUNTER — Ambulatory Visit (INDEPENDENT_AMBULATORY_CARE_PROVIDER_SITE_OTHER): Payer: Medicare Other | Admitting: General Practice

## 2013-05-18 DIAGNOSIS — I82409 Acute embolism and thrombosis of unspecified deep veins of unspecified lower extremity: Secondary | ICD-10-CM

## 2013-06-15 ENCOUNTER — Ambulatory Visit (INDEPENDENT_AMBULATORY_CARE_PROVIDER_SITE_OTHER): Payer: Medicare Other | Admitting: General Practice

## 2013-06-15 DIAGNOSIS — I82409 Acute embolism and thrombosis of unspecified deep veins of unspecified lower extremity: Secondary | ICD-10-CM | POA: Diagnosis not present

## 2013-06-15 DIAGNOSIS — Z7901 Long term (current) use of anticoagulants: Secondary | ICD-10-CM

## 2013-06-15 LAB — POCT INR: INR: 3

## 2013-06-29 ENCOUNTER — Other Ambulatory Visit (INDEPENDENT_AMBULATORY_CARE_PROVIDER_SITE_OTHER): Payer: Medicare Other

## 2013-06-29 DIAGNOSIS — IMO0001 Reserved for inherently not codable concepts without codable children: Secondary | ICD-10-CM

## 2013-06-29 DIAGNOSIS — E785 Hyperlipidemia, unspecified: Secondary | ICD-10-CM

## 2013-06-29 DIAGNOSIS — I10 Essential (primary) hypertension: Secondary | ICD-10-CM | POA: Diagnosis not present

## 2013-06-29 DIAGNOSIS — E039 Hypothyroidism, unspecified: Secondary | ICD-10-CM | POA: Diagnosis not present

## 2013-06-29 LAB — CBC WITH DIFFERENTIAL/PLATELET
Basophils Absolute: 0 10*3/uL (ref 0.0–0.1)
Eosinophils Absolute: 0.2 10*3/uL (ref 0.0–0.7)
Hemoglobin: 12.6 g/dL (ref 12.0–15.0)
Lymphocytes Relative: 27.4 % (ref 12.0–46.0)
Monocytes Relative: 6.5 % (ref 3.0–12.0)
Neutro Abs: 3.7 10*3/uL (ref 1.4–7.7)
Neutrophils Relative %: 61.5 % (ref 43.0–77.0)
RBC: 4.4 Mil/uL (ref 3.87–5.11)
RDW: 14.5 % (ref 11.5–14.6)

## 2013-06-29 LAB — POCT URINALYSIS DIPSTICK
Bilirubin, UA: NEGATIVE
Glucose, UA: NEGATIVE
Ketones, UA: NEGATIVE
Leukocytes, UA: NEGATIVE
Protein, UA: NEGATIVE
Spec Grav, UA: 1.02

## 2013-06-29 LAB — HEPATIC FUNCTION PANEL
AST: 25 U/L (ref 0–37)
Albumin: 4 g/dL (ref 3.5–5.2)
Alkaline Phosphatase: 57 U/L (ref 39–117)
Bilirubin, Direct: 0.1 mg/dL (ref 0.0–0.3)

## 2013-06-29 LAB — BASIC METABOLIC PANEL
Calcium: 9.3 mg/dL (ref 8.4–10.5)
Creatinine, Ser: 1 mg/dL (ref 0.4–1.2)
GFR: 59.55 mL/min — ABNORMAL LOW (ref >60.00)
Glucose, Bld: 128 mg/dL — ABNORMAL HIGH (ref 70–99)
Sodium: 143 mEq/L (ref 135–145)

## 2013-06-29 LAB — LIPID PANEL
LDL Cholesterol: 81 mg/dL (ref 0–99)
Total CHOL/HDL Ratio: 3

## 2013-06-29 LAB — MICROALBUMIN / CREATININE URINE RATIO
Microalb Creat Ratio: 0.3 mg/g (ref 0.0–30.0)
Microalb, Ur: 0.3 mg/dL (ref 0.0–1.9)

## 2013-07-07 ENCOUNTER — Encounter: Payer: Medicare Other | Admitting: Internal Medicine

## 2013-07-10 ENCOUNTER — Encounter: Payer: Self-pay | Admitting: Internal Medicine

## 2013-07-10 ENCOUNTER — Other Ambulatory Visit: Payer: Self-pay | Admitting: Internal Medicine

## 2013-07-10 ENCOUNTER — Ambulatory Visit (INDEPENDENT_AMBULATORY_CARE_PROVIDER_SITE_OTHER): Payer: Medicare Other | Admitting: Internal Medicine

## 2013-07-10 VITALS — BP 130/80 | HR 72 | Temp 98.2°F | Resp 16 | Ht 64.5 in | Wt 205.0 lb

## 2013-07-10 DIAGNOSIS — Z0000 Encounter for general adult medical examination without abnormal findings: Secondary | ICD-10-CM | POA: Diagnosis not present

## 2013-07-10 DIAGNOSIS — Z23 Encounter for immunization: Secondary | ICD-10-CM | POA: Diagnosis not present

## 2013-07-10 NOTE — Progress Notes (Signed)
Subjective:    Patient ID: Erin Kirk, female    DOB: 07-05-1956, 57 y.o.   MRN: [id number]  HPI Patient is a 57 year old female who presents for her yearly examination.  She is adult-onset diabetes hypertension history of varicosities with recurrent phlebitis and a hypercoagulable syndrome on chronic anticoagulation.  Generally she is doing well she has been losing weight but has plateaued Last A1c was 6.2   Review of Systems  Constitutional: Negative for activity change, appetite change and fatigue.  HENT: Negative for congestion, ear pain, postnasal drip and sinus pressure.   Eyes: Negative for redness and visual disturbance.  Respiratory: Negative for cough, shortness of breath and wheezing.   Gastrointestinal: Positive for abdominal distention. Negative for abdominal pain.  Genitourinary: Negative for dysuria, frequency and menstrual problem.  Musculoskeletal: Positive for joint swelling. Negative for arthralgias, myalgias and neck pain.  Skin: Negative for rash and wound.  Neurological: Negative for dizziness, weakness and headaches.  Hematological: Negative for adenopathy. Bruises/bleeds easily.  Psychiatric/Behavioral: Negative for sleep disturbance and decreased concentration.   Past Medical History  Diagnosis Date  . Thyroid disease   . GERD (gastroesophageal reflux disease)   . Hypertension   . Allergy   . Cancer     skin  . Primary hypercoagulable state     History   Social History  . Marital Status: Married    Spouse Name: N/A    Number of Children: N/A  . Years of Education: N/A   Occupational History  . Not on file.   Social History Main Topics  . Smoking status: Never Smoker   . Smokeless tobacco: Not on file  . Alcohol Use: No  . Drug Use: No  . Sexual Activity: Yes   Other Topics Concern  . Not on file   Social History Narrative  . No narrative on file    Past Surgical History  Procedure Laterality Date  . Cholecystectomy    .  Abdominal hysterectomy    . Oophorectomy      Family History  Problem Relation Age of Onset  . Coronary artery disease      fhx  . Diabetes Mother   . Hypertension Father   . Diabetes Father     Allergies  Allergen Reactions  . Iohexol Anaphylaxis, Hives and Shortness Of Breath    Incident 1990, premedicated prior to obtaining IV contrast since.  . Amoxicillin-Pot Clavulanate     REACTION: unspecified    Current Outpatient Prescriptions on File Prior to Visit  Medication Sig Dispense Refill  . acetaminophen (TYLENOL) 325 MG tablet Take 650 mg by mouth every 6 (six) hours as needed.        . Ascorbic Acid (VITAMIN C) 500 MG tablet Take 500 mg by mouth daily.        Marland Kitchen BAYER CONTOUR TEST test strip USE ONE STRIP ONCE DAILY.  100 each  0  . Calcium Citrate-Vitamin D 250-200 MG-UNIT TABS Take by mouth 2 (two) times daily.        Jennette Banker Sodium 30-100 MG CAPS Take by mouth daily.        Marland Kitchen COUMADIN 5 MG tablet TAKE AS DIRECTED  100 tablet  6  . diltiazem (CARDIZEM CD) 360 MG 24 hr capsule TAKE ONE CAPSULE BY MOUTH EVERY DAY  90 capsule  3  . DIOVAN 320 MG tablet TAKE ONE TABLET BY MOUTH EVERY DAY  90 tablet  3  . Doxepin HCl (SILENOR) 6 MG TABS  Take by mouth as needed.        . folic acid (FOLVITE) 1 MG tablet TAKE ONE TABLET BY MOUTH EVERY DAY  30 tablet  6  . HYDROcodone-acetaminophen (VICODIN) 5-500 MG per tablet Take 1 tablet by mouth every 6 (six) hours as needed.        Marland Kitchen JANUMET XR 50-1000 MG TB24 TAKE ONE TABLET BY MOUTH EVERY DAY WITH BREAKFAST  30 tablet  10  . levothyroxine (SYNTHROID, LEVOTHROID) 100 MCG tablet TAKE ONE TABLET BY MOUTH ONCE DAILY.  90 tablet  3  . metoprolol succinate (TOPROL-XL) 25 MG 24 hr tablet TAKE ONE TABLET BY MOUTH EVERY DAY  90 tablet  3  . promethazine (PHENERGAN) 25 MG suppository Place 25 mg rectally every 6 (six) hours as needed.        . ranitidine (ZANTAC) 300 MG tablet TAKE ONE TABLET BY MOUTH EVERY DAY AT BEDTIME  90 tablet   3  . triamterene-hydrochlorothiazide (MAXZIDE-25) 37.5-25 MG per tablet TAKE ONE TABLET BY MOUTH IN THE MORNING.  90 tablet  3  . Vitamin D, Ergocalciferol, (DRISDOL) 50000 UNITS CAPS TAKE ONE CAPSULE BY MOUTH ONCE A WEEK  5 capsule  3   No current facility-administered medications on file prior to visit.    BP 130/80  Pulse 72  Temp(Src) 98.2 F (36.8 C)  Resp 16  Ht 5' 4.5" (1.638 m)  Wt 205 lb (92.987 kg)  BMI 34.66 kg/m2       Objective:   Physical Exam  Nursing note and vitals reviewed. Constitutional: She is oriented to person, place, and time. She appears well-developed and well-nourished. No distress.  HENT:  Head: Normocephalic and atraumatic.  Right Ear: External ear normal.  Left Ear: External ear normal.  Nose: Nose normal.  Mouth/Throat: Oropharynx is clear and moist.  Eyes: Conjunctivae and EOM are normal. Pupils are equal, round, and reactive to light.  Neck: Normal range of motion. Neck supple. No JVD present. No tracheal deviation present. No thyromegaly present.  Cardiovascular: Normal rate, regular rhythm and intact distal pulses.   Murmur heard. Pulmonary/Chest: Effort normal and breath sounds normal. She has no wheezes. She exhibits no tenderness.  Abdominal: Soft. Bowel sounds are normal.  Musculoskeletal: Normal range of motion. She exhibits no edema and no tenderness.  Lymphadenopathy:    She has no cervical adenopathy.  Neurological: She is alert and oriented to person, place, and time. She has normal reflexes. No cranial nerve deficit.  Skin: Skin is warm and dry. She is not diaphoretic.  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 Her behavior is normal.          Assessment & Plan:   This is a routine physical examination for this healthy  Female. Reviewed all health maintenance protocols including mammography colonoscopy bone density and reviewed appropriate screening  labs. Her immunization history was reviewed as well as her current medications and allergies refills of her chronic medications were given and the plan for yearly health maintenance was discussed all orders and referrals were made as appropriate.   Stable hypercoagulable syndrome without evidence of superficial phlebitis.  Improve diabetes with weight loss continue weight loss and diet program up to date with health maintenance at this time

## 2013-07-27 ENCOUNTER — Ambulatory Visit: Payer: Medicare Other

## 2013-07-31 ENCOUNTER — Ambulatory Visit (INDEPENDENT_AMBULATORY_CARE_PROVIDER_SITE_OTHER): Payer: Medicare Other | Admitting: Family

## 2013-07-31 DIAGNOSIS — Z7901 Long term (current) use of anticoagulants: Secondary | ICD-10-CM | POA: Diagnosis not present

## 2013-07-31 DIAGNOSIS — I82401 Acute embolism and thrombosis of unspecified deep veins of right lower extremity: Secondary | ICD-10-CM

## 2013-07-31 DIAGNOSIS — I82409 Acute embolism and thrombosis of unspecified deep veins of unspecified lower extremity: Secondary | ICD-10-CM | POA: Diagnosis not present

## 2013-07-31 LAB — POCT INR: INR: 3.5

## 2013-07-31 NOTE — Patient Instructions (Signed)
Continue to take 5 mg all days except 7.5 mg on Tuesday/Friday.  Re-check in 6 weeks.  Anticoagulation Dose Instructions as of 07/31/2013     Glynis Smiles Tue Wed Thu Fri Sat   New Dose 5 mg 5 mg 7.5 mg 5 mg 5 mg 7.5 mg 5 mg    Description       Continue to take 5 mg all days except 7.5 mg on Tuesday/Friday.  Re-check in 6 weeks.

## 2013-08-02 ENCOUNTER — Encounter (HOSPITAL_COMMUNITY): Payer: Self-pay | Admitting: Emergency Medicine

## 2013-08-02 ENCOUNTER — Emergency Department (HOSPITAL_COMMUNITY): Payer: No Typology Code available for payment source

## 2013-08-02 ENCOUNTER — Emergency Department (HOSPITAL_COMMUNITY)
Admission: EM | Admit: 2013-08-02 | Discharge: 2013-08-03 | Disposition: A | Discharge status: Home / Self Care | Payer: No Typology Code available for payment source | Attending: Emergency Medicine | Admitting: Emergency Medicine

## 2013-08-02 DIAGNOSIS — Z862 Personal history of diseases of the blood and blood-forming organs and certain disorders involving the immune mechanism: Secondary | ICD-10-CM | POA: Diagnosis not present

## 2013-08-02 DIAGNOSIS — I10 Essential (primary) hypertension: Secondary | ICD-10-CM | POA: Insufficient documentation

## 2013-08-02 DIAGNOSIS — S0990XA Unspecified injury of head, initial encounter: Secondary | ICD-10-CM | POA: Diagnosis not present

## 2013-08-02 DIAGNOSIS — Z85828 Personal history of other malignant neoplasm of skin: Secondary | ICD-10-CM | POA: Diagnosis not present

## 2013-08-02 DIAGNOSIS — S4980XA Other specified injuries of shoulder and upper arm, unspecified arm, initial encounter: Secondary | ICD-10-CM | POA: Diagnosis not present

## 2013-08-02 DIAGNOSIS — R42 Dizziness and giddiness: Secondary | ICD-10-CM | POA: Diagnosis not present

## 2013-08-02 DIAGNOSIS — T148XXA Other injury of unspecified body region, initial encounter: Secondary | ICD-10-CM | POA: Diagnosis not present

## 2013-08-02 DIAGNOSIS — Z7901 Long term (current) use of anticoagulants: Secondary | ICD-10-CM | POA: Diagnosis not present

## 2013-08-02 DIAGNOSIS — S139XXA Sprain of joints and ligaments of unspecified parts of neck, initial encounter: Secondary | ICD-10-CM | POA: Diagnosis not present

## 2013-08-02 DIAGNOSIS — Y92410 Unspecified street and highway as the place of occurrence of the external cause: Secondary | ICD-10-CM | POA: Insufficient documentation

## 2013-08-02 DIAGNOSIS — S161XXA Strain of muscle, fascia and tendon at neck level, initial encounter: Secondary | ICD-10-CM

## 2013-08-02 DIAGNOSIS — E079 Disorder of thyroid, unspecified: Secondary | ICD-10-CM | POA: Diagnosis not present

## 2013-08-02 DIAGNOSIS — M25519 Pain in unspecified shoulder: Secondary | ICD-10-CM | POA: Diagnosis not present

## 2013-08-02 DIAGNOSIS — Z79899 Other long term (current) drug therapy: Secondary | ICD-10-CM | POA: Insufficient documentation

## 2013-08-02 DIAGNOSIS — K219 Gastro-esophageal reflux disease without esophagitis: Secondary | ICD-10-CM | POA: Diagnosis not present

## 2013-08-02 DIAGNOSIS — S0993XA Unspecified injury of face, initial encounter: Secondary | ICD-10-CM | POA: Diagnosis not present

## 2013-08-02 DIAGNOSIS — Y9389 Activity, other specified: Secondary | ICD-10-CM | POA: Insufficient documentation

## 2013-08-02 DIAGNOSIS — M542 Cervicalgia: Secondary | ICD-10-CM | POA: Diagnosis not present

## 2013-08-02 DIAGNOSIS — S46909A Unspecified injury of unspecified muscle, fascia and tendon at shoulder and upper arm level, unspecified arm, initial encounter: Secondary | ICD-10-CM | POA: Insufficient documentation

## 2013-08-02 LAB — POCT I-STAT, CHEM 8
Calcium, Ion: 1.24 mmol/L — ABNORMAL HIGH (ref 1.12–1.23)
Chloride: 101 mEq/L (ref 96–112)
Creatinine, Ser: 1.1 mg/dL (ref 0.50–1.10)
HCT: 41 % (ref 36.0–46.0)
Potassium: 3.3 mEq/L — ABNORMAL LOW (ref 3.5–5.1)

## 2013-08-02 LAB — BASIC METABOLIC PANEL
BUN: 12 mg/dL (ref 6–23)
CO2: 28 mEq/L (ref 19–32)
Calcium: 9.4 mg/dL (ref 8.4–10.5)
Chloride: 100 mEq/L (ref 96–112)
Potassium: 3.8 mEq/L (ref 3.5–5.1)
Sodium: 139 mEq/L (ref 135–145)

## 2013-08-02 LAB — PROTIME-INR
INR: 2.67 — ABNORMAL HIGH (ref 0.00–1.49)
Prothrombin Time: 27.5 seconds — ABNORMAL HIGH (ref 11.6–15.2)

## 2013-08-02 MED ORDER — OXYCODONE-ACETAMINOPHEN 5-325 MG PO TABS
1.0000 | ORAL_TABLET | Freq: Once | ORAL | Status: AC
  Administered 2013-08-02: 1 via ORAL
  Filled 2013-08-02: qty 1

## 2013-08-02 MED ORDER — FENTANYL CITRATE 0.05 MG/ML IJ SOLN
50.0000 ug | Freq: Once | INTRAMUSCULAR | Status: AC
  Administered 2013-08-02: 50 ug via INTRAMUSCULAR
  Filled 2013-08-02: qty 2

## 2013-08-02 MED ORDER — HYDROCORTISONE SOD SUCCINATE 100 MG IJ SOLR
200.0000 mg | Freq: Once | INTRAMUSCULAR | Status: AC
  Administered 2013-08-02: 200 mg via INTRAVENOUS
  Filled 2013-08-02: qty 4

## 2013-08-02 MED ORDER — DIAZEPAM 5 MG PO TABS
10.0000 mg | ORAL_TABLET | Freq: Once | ORAL | Status: AC
  Administered 2013-08-02: 10 mg via ORAL
  Filled 2013-08-02: qty 2

## 2013-08-02 MED ORDER — IOHEXOL 350 MG/ML SOLN
100.0000 mL | Freq: Once | INTRAVENOUS | Status: AC | PRN
  Administered 2013-08-02: 40 mL via INTRAVENOUS

## 2013-08-02 MED ORDER — DIPHENHYDRAMINE HCL 50 MG/ML IJ SOLN
50.0000 mg | Freq: Once | INTRAMUSCULAR | Status: AC
  Administered 2013-08-02: 50 mg via INTRAVENOUS
  Filled 2013-08-02: qty 1
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
## 2013-08-02 NOTE — ED Notes (Signed)
Family upset with the wait, explained the procedure to family and pt. Ordered the xray and pain medication.

## 2013-08-02 NOTE — ED Notes (Signed)
Patient states on coumadin last check 2 days ago in normal range.

## 2013-08-02 NOTE — ED Notes (Signed)
Xray called and wants to add an additional order swimmers xray.
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
## 2013-08-02 NOTE — ED Notes (Signed)
Pt states she had scan with contrast and she was given benadryl prior to scan and she did not have reaction.

## 2013-08-02 NOTE — ED Notes (Signed)
Pt alert, NAD, calm, interactive, skin W&D, resps e/u, wearing c-collar, blood drawn, urine sample saved, moved to A11, report given to EP, RN.

## 2013-08-02 NOTE — ED Notes (Signed)
Pt. Stated, pain was no better

## 2013-08-02 NOTE — ED Notes (Signed)
Pt upset about wait time. Apologized to patient for wait. Comfort measures completed. Updated on plan of care

## 2013-08-02 NOTE — ED Notes (Signed)
Patient transported to X-ray 

## 2013-08-02 NOTE — ED Notes (Signed)
Dr Harrison at bedside

## 2013-08-03 MED ORDER — DIAZEPAM 5 MG PO TABS: 5.0000 mg | ORAL_TABLET | Freq: Four times a day (QID) | ORAL | Status: DC | PRN

## 2013-08-03 MED ORDER — OXYCODONE-ACETAMINOPHEN 5-325 MG PO TABS
2.0000 | ORAL_TABLET | Freq: Once | ORAL | Status: AC
  Administered 2013-08-03: 2 via ORAL
  Filled 2013-08-03: qty 2

## 2013-08-03 MED ORDER — DIAZEPAM 5 MG PO TABS
10.0000 mg | ORAL_TABLET | Freq: Once | ORAL | Status: AC
  Administered 2013-08-03: 10 mg via ORAL
  Filled 2013-08-03: qty 2

## 2013-08-03 MED ORDER — OXYCODONE-ACETAMINOPHEN 5-325 MG PO TABS: 1.0000 | ORAL_TABLET | ORAL | Status: DC | PRN

## 2013-08-03 NOTE — ED Provider Notes (Signed)
Medical screening examination/treatment/procedure(s) were conducted as a shared visit with non-physician practitioner(s) and myself.  I personally evaluated the patient during the encounter.  EKG Interpretation   None       I interviewed and examined the patient. Lungs are CTAB. Cardiac exam wnl. Abdomen soft.  Imaging non-contrib, pt has remained stable in ED under obs. Will rec d/c home.   Junius Argyle, MD 08/03/13 [phone]

## 2013-08-04 ENCOUNTER — Telehealth: Payer: Self-pay | Admitting: Internal Medicine
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
## 2013-08-04 NOTE — Telephone Encounter (Signed)
Per Dr Lovell Sheehan pt can see Padonda next week.  He is only here on Monday and Friday and has no appts available

## 2013-08-07 NOTE — Telephone Encounter (Signed)
appt scheduled

## 2013-08-09 ENCOUNTER — Encounter: Payer: Self-pay | Admitting: Family

## 2013-08-09 ENCOUNTER — Ambulatory Visit (INDEPENDENT_AMBULATORY_CARE_PROVIDER_SITE_OTHER): Payer: Medicare Other | Admitting: Family

## 2013-08-09 DIAGNOSIS — M25519 Pain in unspecified shoulder: Secondary | ICD-10-CM | POA: Diagnosis not present

## 2013-08-09 DIAGNOSIS — S161XXD Strain of muscle, fascia and tendon at neck level, subsequent encounter: Secondary | ICD-10-CM

## 2013-08-09 DIAGNOSIS — M542 Cervicalgia: Secondary | ICD-10-CM | POA: Diagnosis not present

## 2013-08-09 DIAGNOSIS — Z5189 Encounter for other specified aftercare: Secondary | ICD-10-CM | POA: Diagnosis not present

## 2013-08-09 DIAGNOSIS — Z79899 Other long term (current) drug therapy: Secondary | ICD-10-CM | POA: Diagnosis not present

## 2013-08-09 DIAGNOSIS — M25512 Pain in left shoulder: Secondary | ICD-10-CM

## 2013-08-09 LAB — POCT INR: INR: 3.6

## 2013-08-09 MED ORDER — OXYCODONE-ACETAMINOPHEN 5-325 MG PO TABS: 1.0000 | ORAL_TABLET | Freq: Three times a day (TID) | ORAL | Status: DC | PRN

## 2013-08-09 MED ORDER — METAXALONE 800 MG PO TABS: 800.0000 mg | ORAL_TABLET | Freq: Three times a day (TID) | ORAL | Status: DC
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
## 2013-08-09 NOTE — Progress Notes (Signed)
Pre visit review using our clinic review tool, if applicable. No additional management support is needed unless otherwise documented below in the visit note. 
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
## 2013-08-19 ENCOUNTER — Other Ambulatory Visit: Payer: Self-pay | Admitting: Internal Medicine

## 2013-08-21 ENCOUNTER — Other Ambulatory Visit: Payer: Self-pay | Admitting: General Practice

## 2013-08-21 DIAGNOSIS — Z01419 Encounter for gynecological examination (general) (routine) without abnormal findings: Secondary | ICD-10-CM | POA: Diagnosis not present

## 2013-08-21 DIAGNOSIS — Z124 Encounter for screening for malignant neoplasm of cervix: Secondary | ICD-10-CM | POA: Diagnosis not present

## 2013-08-21 MED ORDER — WARFARIN SODIUM 5 MG PO TABS: ORAL_TABLET | ORAL | Status: DC

## 2013-08-23 ENCOUNTER — Ambulatory Visit: Payer: No Typology Code available for payment source | Attending: Family | Admitting: Physical Therapy

## 2013-08-23 DIAGNOSIS — M542 Cervicalgia: Secondary | ICD-10-CM | POA: Diagnosis not present

## 2013-08-23 DIAGNOSIS — M25519 Pain in unspecified shoulder: Secondary | ICD-10-CM | POA: Diagnosis not present

## 2013-08-23 DIAGNOSIS — IMO0001 Reserved for inherently not codable concepts without codable children: Secondary | ICD-10-CM | POA: Diagnosis present

## 2013-08-26 ENCOUNTER — Other Ambulatory Visit: Payer: Self-pay | Admitting: Internal Medicine

## 2013-08-29 ENCOUNTER — Ambulatory Visit: Payer: No Typology Code available for payment source | Admitting: Physical Therapy

## 2013-08-29 DIAGNOSIS — IMO0001 Reserved for inherently not codable concepts without codable children: Secondary | ICD-10-CM | POA: Diagnosis not present

## 2013-08-31 ENCOUNTER — Ambulatory Visit: Payer: No Typology Code available for payment source | Admitting: Physical Therapy

## 2013-08-31 DIAGNOSIS — IMO0001 Reserved for inherently not codable concepts without codable children: Secondary | ICD-10-CM | POA: Diagnosis not present

## 2013-09-01 ENCOUNTER — Encounter: Payer: Self-pay | Admitting: *Deleted

## 2013-09-04 ENCOUNTER — Ambulatory Visit (INDEPENDENT_AMBULATORY_CARE_PROVIDER_SITE_OTHER): Payer: Medicare Other | Admitting: General Practice

## 2013-09-04 ENCOUNTER — Encounter: Payer: Self-pay | Admitting: Physical Therapy

## 2013-09-04 DIAGNOSIS — Z79899 Other long term (current) drug therapy: Secondary | ICD-10-CM | POA: Diagnosis not present

## 2013-09-04 DIAGNOSIS — Z7901 Long term (current) use of anticoagulants: Secondary | ICD-10-CM | POA: Diagnosis not present

## 2013-09-04 DIAGNOSIS — I82409 Acute embolism and thrombosis of unspecified deep veins of unspecified lower extremity: Secondary | ICD-10-CM | POA: Diagnosis not present

## 2013-09-04 LAB — POCT INR: INR: 4.3

## 2013-09-04 NOTE — Progress Notes (Signed)
Pre-visit discussion using our clinic review tool. No additional management support is needed unless otherwise documented below in the visit note.  

## 2013-09-06 ENCOUNTER — Ambulatory Visit: Payer: No Typology Code available for payment source | Admitting: Physical Therapy

## 2013-09-06 DIAGNOSIS — IMO0001 Reserved for inherently not codable concepts without codable children: Secondary | ICD-10-CM | POA: Diagnosis not present

## 2013-09-07 ENCOUNTER — Ambulatory Visit: Payer: No Typology Code available for payment source | Admitting: Physical Therapy

## 2013-09-07 DIAGNOSIS — IMO0001 Reserved for inherently not codable concepts without codable children: Secondary | ICD-10-CM | POA: Diagnosis not present

## 2013-09-11 ENCOUNTER — Ambulatory Visit: Payer: Medicare Other

## 2013-09-11 ENCOUNTER — Ambulatory Visit: Payer: No Typology Code available for payment source | Admitting: Physical Therapy

## 2013-09-11 DIAGNOSIS — IMO0001 Reserved for inherently not codable concepts without codable children: Secondary | ICD-10-CM | POA: Diagnosis not present

## 2013-09-12 ENCOUNTER — Ambulatory Visit: Payer: No Typology Code available for payment source | Admitting: Physical Therapy

## 2013-09-19 ENCOUNTER — Ambulatory Visit: Payer: No Typology Code available for payment source | Admitting: Physical Therapy

## 2013-09-19 DIAGNOSIS — IMO0001 Reserved for inherently not codable concepts without codable children: Secondary | ICD-10-CM | POA: Diagnosis not present

## 2013-09-20 ENCOUNTER — Ambulatory Visit: Payer: No Typology Code available for payment source | Admitting: Physical Therapy

## 2013-09-22 ENCOUNTER — Ambulatory Visit: Payer: No Typology Code available for payment source | Attending: Family | Admitting: Physical Therapy

## 2013-09-22 DIAGNOSIS — M542 Cervicalgia: Secondary | ICD-10-CM | POA: Insufficient documentation

## 2013-09-22 DIAGNOSIS — IMO0001 Reserved for inherently not codable concepts without codable children: Secondary | ICD-10-CM | POA: Insufficient documentation

## 2013-09-22 DIAGNOSIS — M25519 Pain in unspecified shoulder: Secondary | ICD-10-CM | POA: Insufficient documentation

## 2013-09-26 ENCOUNTER — Ambulatory Visit: Payer: No Typology Code available for payment source | Admitting: Physical Therapy

## 2013-09-26 DIAGNOSIS — IMO0001 Reserved for inherently not codable concepts without codable children: Secondary | ICD-10-CM | POA: Diagnosis not present

## 2013-09-28 ENCOUNTER — Ambulatory Visit (INDEPENDENT_AMBULATORY_CARE_PROVIDER_SITE_OTHER): Payer: Medicare Other | Admitting: General Practice

## 2013-09-28 ENCOUNTER — Ambulatory Visit: Payer: No Typology Code available for payment source | Admitting: Physical Therapy

## 2013-09-28 DIAGNOSIS — I82409 Acute embolism and thrombosis of unspecified deep veins of unspecified lower extremity: Secondary | ICD-10-CM | POA: Diagnosis not present

## 2013-09-28 DIAGNOSIS — IMO0001 Reserved for inherently not codable concepts without codable children: Secondary | ICD-10-CM | POA: Diagnosis not present

## 2013-09-28 LAB — POCT INR: INR: 3.4

## 2013-09-28 NOTE — Progress Notes (Signed)
Pre-visit discussion using our clinic review tool. No additional management support is needed unless otherwise documented below in the visit note.  

## 2013-09-30 ENCOUNTER — Other Ambulatory Visit: Payer: Self-pay | Admitting: Internal Medicine

## 2013-10-02 ENCOUNTER — Ambulatory Visit: Payer: No Typology Code available for payment source | Admitting: Physical Therapy

## 2013-10-02 DIAGNOSIS — IMO0001 Reserved for inherently not codable concepts without codable children: Secondary | ICD-10-CM | POA: Diagnosis not present

## 2013-10-04 ENCOUNTER — Ambulatory Visit: Payer: No Typology Code available for payment source | Admitting: Physical Therapy

## 2013-10-05 ENCOUNTER — Telehealth: Payer: Self-pay | Admitting: Internal Medicine

## 2013-10-05 NOTE — Telephone Encounter (Signed)
Patient Information:  Caller Name: Marirose  Phone: [phone]  Patient: Erin Kirk, Erin Kirk  Gender: Female  DOB: 22-Feb-1956  Age: 58 Years  PCP: Benay Pillow (Adults only)  Office Follow Up:  Does the office need to follow up with this patient?: No  Instructions For The Office: N/A  RN Note:  Pt wants appt with Dr Arnoldo Morale and refused other providers.  Transferred the call to the office to appt.   Symptoms  Reason For Call & Symptoms: Pt states she has been going to physical therapy 2-3 days a week.  Pt states she feels this is not helping.  Pt states pain in the neck and shoulder and low back.  Reviewed Health History In EMR: Yes  Reviewed Medications In EMR: Yes  Reviewed Allergies In EMR: Yes  Reviewed Surgeries / Procedures: Yes  Date of Onset of Symptoms: 08/02/2013  Guideline(s) Used:  Back Pain  Disposition Per Guideline:   See Today or Tomorrow in Office  Reason For Disposition Reached:   Pain radiates into the thigh or further down the leg  Advice Given:  Call Back If:  You become worse.  Patient Will Follow Care Advice:  YES

## 2013-10-06 ENCOUNTER — Encounter: Payer: Self-pay | Admitting: Family Medicine

## 2013-10-06 ENCOUNTER — Ambulatory Visit: Payer: No Typology Code available for payment source | Admitting: Physical Therapy

## 2013-10-06 ENCOUNTER — Ambulatory Visit (INDEPENDENT_AMBULATORY_CARE_PROVIDER_SITE_OTHER): Payer: Medicare Other | Admitting: Family Medicine

## 2013-10-06 VITALS — BP 140/88 | Temp 97.9°F | Wt 202.0 lb

## 2013-10-06 DIAGNOSIS — M542 Cervicalgia: Secondary | ICD-10-CM | POA: Diagnosis not present

## 2013-10-06 DIAGNOSIS — IMO0001 Reserved for inherently not codable concepts without codable children: Secondary | ICD-10-CM | POA: Diagnosis not present

## 2013-10-06 DIAGNOSIS — M549 Dorsalgia, unspecified: Secondary | ICD-10-CM

## 2013-10-06 NOTE — Progress Notes (Signed)
Pre visit review using our clinic review tool, if applicable. No additional management support is needed unless otherwise documented below in the visit note. 

## 2013-10-06 NOTE — Patient Instructions (Addendum)
-  call [name] for an appointment regarding your back pain - they also have a walk in clinic if you are worsening  -continue physical therapy  -continue wean of the pain meds and stop  -use tylenol 500-1000mg  up to 3 times daily and heat for 15 minutes twice dialy
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
## 2013-10-09 ENCOUNTER — Other Ambulatory Visit: Payer: Self-pay | Admitting: Sports Medicine

## 2013-10-09 ENCOUNTER — Ambulatory Visit: Payer: No Typology Code available for payment source | Admitting: Physical Therapy

## 2013-10-09 DIAGNOSIS — M545 Low back pain, unspecified: Secondary | ICD-10-CM | POA: Diagnosis not present

## 2013-10-09 DIAGNOSIS — IMO0001 Reserved for inherently not codable concepts without codable children: Secondary | ICD-10-CM | POA: Diagnosis not present

## 2013-10-11 ENCOUNTER — Ambulatory Visit: Payer: No Typology Code available for payment source | Admitting: Physical Therapy

## 2013-10-11 DIAGNOSIS — IMO0001 Reserved for inherently not codable concepts without codable children: Secondary | ICD-10-CM | POA: Diagnosis not present

## 2013-10-13 ENCOUNTER — Ambulatory Visit: Payer: No Typology Code available for payment source | Admitting: Physical Therapy

## 2013-10-13 ENCOUNTER — Encounter: Payer: Self-pay | Admitting: Internal Medicine

## 2013-10-13 DIAGNOSIS — IMO0001 Reserved for inherently not codable concepts without codable children: Secondary | ICD-10-CM | POA: Diagnosis not present

## 2013-10-14 ENCOUNTER — Ambulatory Visit
Admission: RE | Admit: 2013-10-14 | Discharge: 2013-10-14 | Disposition: A | Discharge status: Home / Self Care | Payer: Medicare Other | Source: Ambulatory Visit | Attending: Sports Medicine | Admitting: Sports Medicine

## 2013-10-14 DIAGNOSIS — M545 Low back pain, unspecified: Secondary | ICD-10-CM

## 2013-10-14 DIAGNOSIS — M47817 Spondylosis without myelopathy or radiculopathy, lumbosacral region: Secondary | ICD-10-CM | POA: Diagnosis not present

## 2013-10-16 ENCOUNTER — Ambulatory Visit: Payer: No Typology Code available for payment source | Admitting: Physical Therapy

## 2013-10-16 DIAGNOSIS — IMO0001 Reserved for inherently not codable concepts without codable children: Secondary | ICD-10-CM | POA: Diagnosis not present

## 2013-10-17 DIAGNOSIS — M545 Low back pain, unspecified: Secondary | ICD-10-CM | POA: Diagnosis not present

## 2013-10-18 ENCOUNTER — Ambulatory Visit: Payer: No Typology Code available for payment source | Admitting: Physical Therapy

## 2013-10-18 DIAGNOSIS — IMO0001 Reserved for inherently not codable concepts without codable children: Secondary | ICD-10-CM | POA: Diagnosis not present

## 2013-10-19 ENCOUNTER — Ambulatory Visit: Payer: Medicare Other | Admitting: Physical Therapy

## 2013-10-23 ENCOUNTER — Ambulatory Visit: Payer: No Typology Code available for payment source | Attending: Family | Admitting: Physical Therapy

## 2013-10-23 DIAGNOSIS — M25519 Pain in unspecified shoulder: Secondary | ICD-10-CM | POA: Insufficient documentation

## 2013-10-23 DIAGNOSIS — M542 Cervicalgia: Secondary | ICD-10-CM | POA: Insufficient documentation

## 2013-10-23 DIAGNOSIS — IMO0001 Reserved for inherently not codable concepts without codable children: Secondary | ICD-10-CM | POA: Insufficient documentation

## 2013-10-24 ENCOUNTER — Ambulatory Visit: Payer: No Typology Code available for payment source | Attending: Sports Medicine | Admitting: Physical Therapy

## 2013-10-24 DIAGNOSIS — M810 Age-related osteoporosis without current pathological fracture: Secondary | ICD-10-CM | POA: Diagnosis not present

## 2013-10-24 DIAGNOSIS — M545 Low back pain, unspecified: Secondary | ICD-10-CM | POA: Insufficient documentation

## 2013-10-24 DIAGNOSIS — R5381 Other malaise: Secondary | ICD-10-CM | POA: Insufficient documentation

## 2013-10-24 DIAGNOSIS — I10 Essential (primary) hypertension: Secondary | ICD-10-CM | POA: Insufficient documentation

## 2013-10-24 DIAGNOSIS — E119 Type 2 diabetes mellitus without complications: Secondary | ICD-10-CM | POA: Diagnosis not present

## 2013-10-24 DIAGNOSIS — IMO0001 Reserved for inherently not codable concepts without codable children: Secondary | ICD-10-CM | POA: Diagnosis present

## 2013-10-24 DIAGNOSIS — E039 Hypothyroidism, unspecified: Secondary | ICD-10-CM | POA: Insufficient documentation

## 2013-10-25 ENCOUNTER — Ambulatory Visit: Payer: No Typology Code available for payment source | Admitting: Physical Therapy

## 2013-10-25 DIAGNOSIS — IMO0001 Reserved for inherently not codable concepts without codable children: Secondary | ICD-10-CM | POA: Diagnosis not present

## 2013-10-26 ENCOUNTER — Ambulatory Visit (INDEPENDENT_AMBULATORY_CARE_PROVIDER_SITE_OTHER): Payer: Medicare Other | Admitting: General Practice

## 2013-10-26 DIAGNOSIS — D6859 Other primary thrombophilia: Secondary | ICD-10-CM | POA: Diagnosis not present

## 2013-10-26 DIAGNOSIS — Z5181 Encounter for therapeutic drug level monitoring: Secondary | ICD-10-CM | POA: Diagnosis not present

## 2013-10-26 DIAGNOSIS — I82409 Acute embolism and thrombosis of unspecified deep veins of unspecified lower extremity: Secondary | ICD-10-CM | POA: Diagnosis not present

## 2013-10-26 LAB — POCT INR: INR: 3.3

## 2013-10-26 NOTE — Progress Notes (Signed)
Pre-visit discussion using our clinic review tool. No additional management support is needed unless otherwise documented below in the visit note.  

## 2013-11-01 ENCOUNTER — Ambulatory Visit: Payer: No Typology Code available for payment source | Admitting: Physical Therapy

## 2013-11-01 DIAGNOSIS — IMO0001 Reserved for inherently not codable concepts without codable children: Secondary | ICD-10-CM | POA: Diagnosis not present

## 2013-11-03 ENCOUNTER — Ambulatory Visit: Payer: No Typology Code available for payment source | Admitting: Physical Therapy

## 2013-11-03 DIAGNOSIS — IMO0001 Reserved for inherently not codable concepts without codable children: Secondary | ICD-10-CM | POA: Diagnosis not present

## 2013-11-06 ENCOUNTER — Ambulatory Visit: Payer: No Typology Code available for payment source | Admitting: Physical Therapy

## 2013-11-08 ENCOUNTER — Encounter: Payer: Self-pay | Admitting: Physical Therapy

## 2013-11-13 ENCOUNTER — Ambulatory Visit: Payer: No Typology Code available for payment source | Admitting: Physical Therapy

## 2013-11-13 DIAGNOSIS — IMO0001 Reserved for inherently not codable concepts without codable children: Secondary | ICD-10-CM | POA: Diagnosis not present

## 2013-11-15 ENCOUNTER — Ambulatory Visit: Payer: No Typology Code available for payment source | Admitting: Physical Therapy

## 2013-11-15 ENCOUNTER — Ambulatory Visit: Payer: No Typology Code available for payment source

## 2013-11-15 DIAGNOSIS — IMO0001 Reserved for inherently not codable concepts without codable children: Secondary | ICD-10-CM | POA: Diagnosis not present

## 2013-11-20 ENCOUNTER — Ambulatory Visit: Payer: Medicare Other | Attending: Sports Medicine | Admitting: Physical Therapy

## 2013-11-20 DIAGNOSIS — M545 Low back pain, unspecified: Secondary | ICD-10-CM | POA: Insufficient documentation

## 2013-11-20 DIAGNOSIS — IMO0001 Reserved for inherently not codable concepts without codable children: Secondary | ICD-10-CM | POA: Insufficient documentation

## 2013-11-20 DIAGNOSIS — E039 Hypothyroidism, unspecified: Secondary | ICD-10-CM | POA: Insufficient documentation

## 2013-11-20 DIAGNOSIS — E119 Type 2 diabetes mellitus without complications: Secondary | ICD-10-CM | POA: Insufficient documentation

## 2013-11-20 DIAGNOSIS — M810 Age-related osteoporosis without current pathological fracture: Secondary | ICD-10-CM | POA: Insufficient documentation

## 2013-11-20 DIAGNOSIS — I10 Essential (primary) hypertension: Secondary | ICD-10-CM | POA: Diagnosis not present

## 2013-11-20 DIAGNOSIS — R5381 Other malaise: Secondary | ICD-10-CM | POA: Diagnosis not present

## 2013-11-22 ENCOUNTER — Ambulatory Visit: Payer: Medicare Other | Admitting: Physical Therapy

## 2013-11-22 DIAGNOSIS — IMO0001 Reserved for inherently not codable concepts without codable children: Secondary | ICD-10-CM | POA: Diagnosis not present

## 2013-11-27 ENCOUNTER — Ambulatory Visit: Payer: Medicare Other | Admitting: Physical Therapy

## 2013-11-27 ENCOUNTER — Other Ambulatory Visit: Payer: Self-pay | Admitting: General Practice

## 2013-11-27 MED ORDER — WARFARIN SODIUM 5 MG PO TABS: ORAL_TABLET | ORAL | Status: DC

## 2013-11-29 ENCOUNTER — Ambulatory Visit: Payer: Medicare Other | Admitting: Physical Therapy

## 2013-11-29 ENCOUNTER — Other Ambulatory Visit: Payer: Self-pay | Admitting: Internal Medicine

## 2013-11-29 DIAGNOSIS — IMO0001 Reserved for inherently not codable concepts without codable children: Secondary | ICD-10-CM | POA: Diagnosis not present

## 2013-11-30 ENCOUNTER — Other Ambulatory Visit: Payer: Self-pay | Admitting: General Practice

## 2013-11-30 ENCOUNTER — Ambulatory Visit (INDEPENDENT_AMBULATORY_CARE_PROVIDER_SITE_OTHER): Payer: Medicare Other | Admitting: General Practice

## 2013-11-30 DIAGNOSIS — D6859 Other primary thrombophilia: Secondary | ICD-10-CM

## 2013-11-30 DIAGNOSIS — Z79899 Other long term (current) drug therapy: Secondary | ICD-10-CM | POA: Diagnosis not present

## 2013-11-30 DIAGNOSIS — Z5181 Encounter for therapeutic drug level monitoring: Secondary | ICD-10-CM | POA: Diagnosis not present

## 2013-11-30 LAB — POCT INR: INR: 3.4

## 2013-11-30 MED ORDER — WARFARIN SODIUM 5 MG PO TABS: ORAL_TABLET | ORAL | Status: DC

## 2013-11-30 NOTE — Progress Notes (Signed)
Pre visit review using our clinic review tool, if applicable. No additional management support is needed unless otherwise documented below in the visit note. 

## 2013-12-04 ENCOUNTER — Ambulatory Visit: Payer: Medicare Other | Admitting: Physical Therapy

## 2013-12-04 DIAGNOSIS — IMO0001 Reserved for inherently not codable concepts without codable children: Secondary | ICD-10-CM | POA: Diagnosis not present

## 2013-12-05 ENCOUNTER — Ambulatory Visit: Payer: Medicare Other | Admitting: Physical Therapy

## 2013-12-05 DIAGNOSIS — IMO0001 Reserved for inherently not codable concepts without codable children: Secondary | ICD-10-CM | POA: Diagnosis not present

## 2013-12-08 ENCOUNTER — Ambulatory Visit (INDEPENDENT_AMBULATORY_CARE_PROVIDER_SITE_OTHER): Payer: Medicare Other | Admitting: Internal Medicine

## 2013-12-08 ENCOUNTER — Encounter: Payer: Self-pay | Admitting: Internal Medicine

## 2013-12-08 VITALS — BP 132/84 | HR 68 | Temp 98.1°F | Ht 64.5 in | Wt 200.0 lb

## 2013-12-08 DIAGNOSIS — IMO0001 Reserved for inherently not codable concepts without codable children: Secondary | ICD-10-CM | POA: Diagnosis not present

## 2013-12-08 DIAGNOSIS — I10 Essential (primary) hypertension: Secondary | ICD-10-CM | POA: Diagnosis not present

## 2013-12-08 DIAGNOSIS — M545 Low back pain, unspecified: Secondary | ICD-10-CM | POA: Diagnosis not present

## 2013-12-08 DIAGNOSIS — E1165 Type 2 diabetes mellitus with hyperglycemia: Principal | ICD-10-CM

## 2013-12-08 LAB — LDL CHOLESTEROL, DIRECT: Direct LDL: 63.2 mg/dL

## 2013-12-08 LAB — HEMOGLOBIN A1C: HEMOGLOBIN A1C: 6.1 % (ref 4.6–6.5)

## 2013-12-08 NOTE — Patient Instructions (Signed)
Stop the coumadin 4 days prior to shot and resume on day of the shot after the shot

## 2013-12-08 NOTE — Progress Notes (Signed)
Pre visit review using our clinic review tool, if applicable. No additional management support is needed unless otherwise documented below in the visit note. 
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
## 2013-12-11 ENCOUNTER — Telehealth: Payer: Self-pay | Admitting: Internal Medicine

## 2013-12-11 ENCOUNTER — Ambulatory Visit: Payer: Medicare Other | Admitting: Physical Therapy

## 2013-12-11 DIAGNOSIS — IMO0001 Reserved for inherently not codable concepts without codable children: Secondary | ICD-10-CM | POA: Diagnosis not present

## 2013-12-11 NOTE — Telephone Encounter (Signed)
Relevant patient education mailed to patient.  

## 2013-12-13 ENCOUNTER — Ambulatory Visit: Payer: Medicare Other | Admitting: Physical Therapy

## 2013-12-13 DIAGNOSIS — IMO0001 Reserved for inherently not codable concepts without codable children: Secondary | ICD-10-CM | POA: Diagnosis not present

## 2013-12-19 DIAGNOSIS — Z7901 Long term (current) use of anticoagulants: Secondary | ICD-10-CM | POA: Diagnosis not present

## 2013-12-19 DIAGNOSIS — M545 Low back pain, unspecified: Secondary | ICD-10-CM | POA: Diagnosis not present

## 2013-12-20 DIAGNOSIS — M545 Low back pain, unspecified: Secondary | ICD-10-CM | POA: Diagnosis not present

## 2013-12-20 DIAGNOSIS — M5126 Other intervertebral disc displacement, lumbar region: Secondary | ICD-10-CM | POA: Diagnosis not present

## 2013-12-20 DIAGNOSIS — IMO0002 Reserved for concepts with insufficient information to code with codable children: Secondary | ICD-10-CM | POA: Diagnosis not present

## 2013-12-20 DIAGNOSIS — Z7901 Long term (current) use of anticoagulants: Secondary | ICD-10-CM | POA: Diagnosis not present

## 2013-12-20 LAB — HM MAMMOGRAPHY

## 2013-12-22 DIAGNOSIS — R922 Inconclusive mammogram: Secondary | ICD-10-CM | POA: Diagnosis not present

## 2013-12-22 DIAGNOSIS — Z0389 Encounter for observation for other suspected diseases and conditions ruled out: Secondary | ICD-10-CM | POA: Diagnosis not present

## 2013-12-22 DIAGNOSIS — Z483 Aftercare following surgery for neoplasm: Secondary | ICD-10-CM | POA: Diagnosis not present

## 2013-12-22 DIAGNOSIS — C439 Malignant melanoma of skin, unspecified: Secondary | ICD-10-CM | POA: Diagnosis not present

## 2013-12-22 DIAGNOSIS — Z1231 Encounter for screening mammogram for malignant neoplasm of breast: Secondary | ICD-10-CM | POA: Diagnosis not present

## 2013-12-22 DIAGNOSIS — R928 Other abnormal and inconclusive findings on diagnostic imaging of breast: Secondary | ICD-10-CM | POA: Diagnosis not present

## 2013-12-22 DIAGNOSIS — Z85820 Personal history of malignant melanoma of skin: Secondary | ICD-10-CM | POA: Diagnosis not present

## 2014-01-05 ENCOUNTER — Ambulatory Visit (INDEPENDENT_AMBULATORY_CARE_PROVIDER_SITE_OTHER): Payer: Medicare Other | Admitting: Physician Assistant

## 2014-01-05 ENCOUNTER — Encounter: Payer: Self-pay | Admitting: Physician Assistant

## 2014-01-05 ENCOUNTER — Ambulatory Visit: Payer: Self-pay | Admitting: Family Medicine

## 2014-01-05 VITALS — BP 130/80 | HR 90 | Temp 98.2°F | Resp 16 | Ht 64.5 in | Wt 200.0 lb

## 2014-01-05 DIAGNOSIS — J029 Acute pharyngitis, unspecified: Secondary | ICD-10-CM | POA: Diagnosis not present

## 2014-01-05 DIAGNOSIS — J309 Allergic rhinitis, unspecified: Secondary | ICD-10-CM | POA: Diagnosis not present

## 2014-01-05 LAB — POCT RAPID STREP A (OFFICE): Rapid Strep A Screen: NEGATIVE

## 2014-01-05 NOTE — Progress Notes (Signed)
Pre visit review using our clinic review tool, if applicable. No additional management support is needed unless otherwise documented below in the visit note. 

## 2014-01-05 NOTE — Progress Notes (Signed)
Subjective:    Patient ID: Erin Kirk, female    DOB: 1956-03-02, 58 y.o.   MRN: [id number]  Sore Throat    Patient is a 58 year old white female presenting to the office today for sore throat. Patient states that yesterday she awoke with a sore throat and she was also experiencing symptoms of postnasal drip some head congestion, a cough described is "clearing her throat", all over achy feeling, and feeling warm. She states that she also experienced several instances of diarrhea yesterday afternoon, however she states she did not specifically feel nauseous, and had no vomiting. She states that she took Mucinex for the drainage, which appears to have helped because she awoke this morning with a less severe sore throat, and had less issues with draining mucous overnight. She states that today she is not experiencing any nausea, vomiting, or diarrhea symptoms. She also denies fevers, chills, shortness of breath.   Review of Systems As per history of present illness and are otherwise negative    Past Medical History  Diagnosis Date  . Thyroid disease   . GERD (gastroesophageal reflux disease)   . Hypertension   . Allergy   . Cancer     skin  . Primary hypercoagulable state    Past Surgical History  Procedure Laterality Date  . Cholecystectomy    . Abdominal hysterectomy    . Oophorectomy      reports that she has never smoked. She does not have any smokeless tobacco history on file. She reports that she does not drink alcohol or use illicit drugs. family history includes Coronary artery disease in an other family member; Diabetes in her father and mother; Hypertension in her father. Allergies  Allergen Reactions  . Iohexol Anaphylaxis, Hives and Shortness Of Breath    Incident 1990, premedicated prior to obtaining IV contrast since.  . Amoxicillin-Pot Clavulanate Other (See Comments)    REACTION: unspecified     Objective:   Physical Exam  Nursing note and vitals  reviewed. Constitutional: She is oriented to person, place, and time. She appears well-developed and well-nourished. No distress.  HENT:  Head: Normocephalic and atraumatic.  Right Ear: External ear normal.  Left Ear: External ear normal.  Nose: Nose normal.  Oropharynx appeared slightly erythematous and cobblestoning on exam. No exudate visualized.  Left tympanic membrane appears slightly bulgy, however no erythematous appearance, no discharge. Right tympanic membrane appears normal.  Bilateral frontal sinuses have diffuse mild tenderness to palpation. Bilateral maxillary sinuses nontender to palpation.   Eyes: Conjunctivae and EOM are normal. Pupils are equal, round, and reactive to light. Right eye exhibits no discharge. Left eye exhibits no discharge. No scleral icterus.  Neck: Normal range of motion. Neck supple. No tracheal deviation present. No thyromegaly present.  Cardiovascular: Normal rate, regular rhythm, normal heart sounds and intact distal pulses.  Exam reveals no gallop and no friction rub.   No murmur heard. Pulmonary/Chest: Effort normal and breath sounds normal. No respiratory distress. She has no wheezes. She has no rales. She exhibits no tenderness.  Abdominal: Soft. Bowel sounds are normal. There is no tenderness.  Musculoskeletal: Normal range of motion. She exhibits no edema and no tenderness (no tenderness on exam).  Lymphadenopathy:    She has no cervical adenopathy.  Neurological: She is alert and oriented to person, place, and time. She has normal reflexes. No cranial nerve deficit. Coordination normal.  Skin: Skin is warm and dry. No rash noted. She is not diaphoretic. No erythema.  No pallor.  Psychiatric: She has a normal mood and affect. Her behavior is normal. Judgment and thought content normal.   Filed Vitals:   01/05/14 1101  BP: 130/80  Pulse: 90  Temp: 98.2 F (36.8 C)  Resp: 16    Lab Results  Component Value Date   WBC 6.1 06/29/2013   HGB  13.9 08/02/2013   HCT 41.0 08/02/2013   PLT 214.0 06/29/2013   GLUCOSE 115* 08/02/2013   CHOL 160 06/29/2013   TRIG 138.0 06/29/2013   HDL 51.70 06/29/2013   LDLDIRECT 63.2 12/08/2013   LDLCALC 81 06/29/2013   ALT 37* 06/29/2013   AST 25 06/29/2013   NA 141 08/02/2013   K 3.3* 08/02/2013   CL 101 08/02/2013   CREATININE 1.10 08/02/2013   BUN 13 08/02/2013   CO2 28 08/02/2013   TSH 1.65 06/29/2013   INR 3.4 11/30/2013   HGBA1C 6.1 12/08/2013   MICROALBUR 0.3 06/29/2013          Assessment & Plan:  Xiomara was seen today for sore throat.  Diagnoses and associated orders for this visit:  Acute pharyngitis - POC Rapid Strep A - Negative - Treat symptomatically per below.  Allergic rhinitis  OTC Mucinex for thick secretions ;force NON dairy fluids .    OTC Flonase OR Nasacort AQ 1 spray in each nostril twice a day as needed. Use the "crossover" technique into opposite nostril spraying toward opposite ear @ 45 degree angle, not straight up into nostril.   Plain OTC Allegra (NOT D )  160 daily , OTC Loratidine 10 mg , OR OTC Zyrtec 10 mg @ bedtime  as needed for itchy eyes & sneezing.  Saline spray and saline rinse will also help breakup mucus.Marland Kitchen  BRAT diet handout provided due to recent diarrhea episodes.  Followup if symptoms worsen or do not improve despite treatment.

## 2014-01-05 NOTE — Patient Instructions (Signed)
OTC Mucinex for thick secretions ;force NON dairy fluids .    OTC Flonase OR Nasacort AQ 1 spray in each nostril twice a day as needed. Use the "crossover" technique into opposite nostril spraying toward opposite ear @ 45 degree angle, not straight up into nostril.   Plain OTC Allegra (NOT D )  160 daily , OTC Loratidine 10 mg , OR OTC Zyrtec 10 mg @ bedtime  as needed for itchy eyes & sneezing.  Sinus rinses and saline sprays may also help to break up mucus.  Try the BRAT diet listed below for easing back into eating.  Followup if symptoms worsen or do not improve despite treatment.   Diet for Diarrhea, Adult Frequent, runny stools (diarrhea) may be caused or worsened by food or drink. Diarrhea may be relieved by changing your diet. Since diarrhea can last up to 7 days, it is easy for you to lose too much fluid from the body and become dehydrated. Fluids that are lost need to be replaced. Along with a modified diet, make sure you drink enough fluids to keep your urine clear or pale yellow. DIET INSTRUCTIONS  Ensure adequate fluid intake (hydration): have 1 cup (8 oz) of fluid for each diarrhea episode. Avoid fluids that contain simple sugars or sports drinks, fruit juices, whole milk products, and sodas. Your urine should be clear or pale yellow if you are drinking enough fluids. Hydrate with an oral rehydration solution that you can purchase at pharmacies, retail stores, and online. You can prepare an oral rehydration solution at home by mixing the following ingredients together:    tsp table salt.   tsp baking soda.   tsp salt substitute containing potassium chloride.  1  tablespoons sugar.  1 L (34 oz) of water.  Certain foods and beverages may increase the speed at which food moves through the gastrointestinal (GI) tract. These foods and beverages should be avoided and include:  Caffeinated and alcoholic beverages.  High-fiber foods, such as raw fruits and vegetables, nuts,  seeds, and whole grain breads and cereals.  Foods and beverages sweetened with sugar alcohols, such as xylitol, sorbitol, and mannitol.  Some foods may be well tolerated and may help thicken stool including:  Starchy foods, such as rice, toast, pasta, low-sugar cereal, oatmeal, grits, baked potatoes, crackers, and bagels.   Bananas.   Applesauce.  Add probiotic-rich foods to help increase healthy bacteria in the GI tract, such as yogurt and fermented milk products. RECOMMENDED FOODS AND BEVERAGES Starches Choose foods with less than 2 g of fiber per serving.  Recommended:  White, [place], and pita breads, plain rolls, buns, bagels. Plain muffins, matzo. Soda, saltine, or graham crackers. Pretzels, melba toast, zwieback. Cooked cereals made with water: cornmeal, farina, cream cereals. Dry cereals: refined corn, wheat, rice. Potatoes prepared any way without skins, refined macaroni, spaghetti, noodles, refined rice.  Avoid:  Bread, rolls, or crackers made with whole wheat, multi-grains, rye, bran seeds, nuts, or coconut. Corn tortillas or taco shells. Cereals containing whole grains, multi-grains, bran, coconut, nuts, raisins. Cooked or dry oatmeal. Coarse wheat cereals, granola. Cereals advertised as "high-fiber." Potato skins. Whole grain pasta, wild or brown rice. Popcorn. Sweet potatoes, yams. Sweet rolls, doughnuts, waffles, pancakes, sweet breads. Vegetables  Recommended: Strained tomato and vegetable juices. Most well-cooked and canned vegetables without seeds. Fresh: Tender lettuce, cucumber without the skin, cabbage, spinach, bean sprouts.  Avoid: Fresh, cooked, or canned: Artichokes, baked beans, beet greens, broccoli, Brussels sprouts, corn, kale, legumes, peas,  sweet potatoes. Cooked: Green or red cabbage, spinach. Avoid large servings of any vegetables because vegetables shrink when cooked, and they contain more fiber per serving than fresh vegetables. Fruit  Recommended:  Cooked or canned: Apricots, applesauce, cantaloupe, cherries, fruit cocktail, grapefruit, grapes, kiwi, mandarin oranges, peaches, pears, plums, watermelon. Fresh: Apples without skin, ripe banana, grapes, cantaloupe, cherries, grapefruit, peaches, oranges, plums. Keep servings limited to  cup or 1 piece.  Avoid: Fresh: Apples with skin, apricots, mangoes, pears, raspberries, strawberries. Prune juice, stewed or dried prunes. Dried fruits, raisins, dates. Large servings of all fresh fruits. Protein  Recommended: Ground or well-cooked tender beef, ham, veal, lamb, pork, or poultry. Eggs. Fish, oysters, shrimp, lobster, other seafoods. Liver, organ meats.  Avoid: Tough, fibrous meats with gristle. Peanut butter, smooth or chunky. Cheese, nuts, seeds, legumes, dried peas, beans, lentils. Dairy  Recommended: Yogurt, lactose-free milk, kefir, drinkable yogurt, buttermilk, soy milk, or plain hard cheese.  Avoid: Milk, chocolate milk, beverages made with milk, such as milkshakes. Soups  Recommended: Bouillon, broth, or soups made from allowed foods. Any strained soup.  Avoid: Soups made from vegetables that are not allowed, cream or milk-based soups. Desserts and Sweets  Recommended: Sugar-free gelatin, sugar-free frozen ice pops made without sugar alcohol.  Avoid: Plain cakes and cookies, pie made with fruit, pudding, custard, cream pie. Gelatin, fruit, ice, sherbet, frozen ice pops. Ice cream, ice milk without nuts. Plain hard candy, honey, jelly, molasses, syrup, sugar, chocolate syrup, gumdrops, marshmallows. Fats and Oils  Recommended: Limit fats to less than 8 tsp per day.  Avoid: Seeds, nuts, olives, avocados. Margarine, butter, cream, mayonnaise, salad oils, plain salad dressings. Plain gravy, crisp bacon without rind. Beverages  Recommended: Water, decaffeinated teas, oral rehydration solutions, sugar-free beverages not sweetened with sugar alcohols.  Avoid: Fruit juices,  caffeinated beverages (coffee, tea, soda), alcohol, sports drinks, or lemon-lime soda. Condiments  Recommended: Ketchup, mustard, horseradish, vinegar, cocoa powder. Spices in moderation: allspice, basil, bay leaves, celery powder or leaves, cinnamon, cumin powder, curry powder, ginger, mace, marjoram, onion or garlic powder, oregano, paprika, parsley flakes, ground pepper, rosemary, sage, savory, tarragon, thyme, turmeric.  Avoid: Coconut, honey. Document Released: 11/28/2003 Document Revised: 06/01/2012 Document Reviewed: 01/22/2012 [facility] Patient Information 2014 [place].

## 2014-01-08 DIAGNOSIS — M5126 Other intervertebral disc displacement, lumbar region: Secondary | ICD-10-CM | POA: Diagnosis not present

## 2014-01-10 DIAGNOSIS — H52229 Regular astigmatism, unspecified eye: Secondary | ICD-10-CM | POA: Diagnosis not present

## 2014-01-10 DIAGNOSIS — H524 Presbyopia: Secondary | ICD-10-CM | POA: Diagnosis not present

## 2014-01-10 DIAGNOSIS — H5200 Hypermetropia, unspecified eye: Secondary | ICD-10-CM | POA: Diagnosis not present

## 2014-01-10 DIAGNOSIS — H2510 Age-related nuclear cataract, unspecified eye: Secondary | ICD-10-CM | POA: Diagnosis not present

## 2014-01-11 ENCOUNTER — Ambulatory Visit (INDEPENDENT_AMBULATORY_CARE_PROVIDER_SITE_OTHER): Payer: Medicare Other | Admitting: General Practice

## 2014-01-11 ENCOUNTER — Telehealth: Payer: Self-pay

## 2014-01-11 DIAGNOSIS — D6859 Other primary thrombophilia: Secondary | ICD-10-CM

## 2014-01-11 DIAGNOSIS — I82409 Acute embolism and thrombosis of unspecified deep veins of unspecified lower extremity: Secondary | ICD-10-CM

## 2014-01-11 DIAGNOSIS — Z5181 Encounter for therapeutic drug level monitoring: Secondary | ICD-10-CM

## 2014-01-11 LAB — POCT INR: INR: 3.5

## 2014-01-11 NOTE — Progress Notes (Signed)
Pre visit review using our clinic review tool, if applicable. No additional management support is needed unless otherwise documented below in the visit note. 

## 2014-01-11 NOTE — Telephone Encounter (Signed)
Pt walked into office today and wanted to know if a form [place] was received.  Pt states she was told the form was faxed over on Monday.  Called and advised pt that the form was received and has been placed for Dr. Arnoldo Morale to sign off on it.  Spoke with [employer] in Coumadin clinic about this as well.  Pt would also like to know if Dr. Arnoldo Morale can call or if she needs to schedule an appt to discuss who her next psychican should be.  Pt states she has a lot of medical problems and wants to make sure her next physician can help.  Pls advise.

## 2014-01-12 NOTE — Telephone Encounter (Signed)
Left a message for pt that form was faxed and confirmation received.

## 2014-01-13 ENCOUNTER — Other Ambulatory Visit: Payer: Self-pay | Admitting: Internal Medicine

## 2014-01-16 ENCOUNTER — Encounter: Payer: Self-pay | Admitting: Internal Medicine

## 2014-01-23 DIAGNOSIS — Z7901 Long term (current) use of anticoagulants: Secondary | ICD-10-CM | POA: Diagnosis not present

## 2014-01-23 DIAGNOSIS — M5126 Other intervertebral disc displacement, lumbar region: Secondary | ICD-10-CM | POA: Diagnosis not present

## 2014-01-24 DIAGNOSIS — M545 Low back pain, unspecified: Secondary | ICD-10-CM | POA: Diagnosis not present

## 2014-01-24 DIAGNOSIS — M5126 Other intervertebral disc displacement, lumbar region: Secondary | ICD-10-CM | POA: Diagnosis not present

## 2014-01-24 DIAGNOSIS — IMO0002 Reserved for concepts with insufficient information to code with codable children: Secondary | ICD-10-CM | POA: Diagnosis not present

## 2014-02-13 ENCOUNTER — Other Ambulatory Visit: Payer: Self-pay | Admitting: Internal Medicine

## 2014-02-14 DIAGNOSIS — M545 Low back pain, unspecified: Secondary | ICD-10-CM | POA: Diagnosis not present

## 2014-02-16 ENCOUNTER — Telehealth: Payer: Self-pay | Admitting: Internal Medicine

## 2014-02-16 NOTE — Telephone Encounter (Addendum)
Pt states a bat got into her home . Animal control was called. They got the bat, and will test and notify pt if there is any isses.  However, they advisedppt to call pcp and inform us. Pt never touched the bat.

## 2014-02-22 ENCOUNTER — Ambulatory Visit (INDEPENDENT_AMBULATORY_CARE_PROVIDER_SITE_OTHER): Payer: Medicare Other | Admitting: General Practice

## 2014-02-22 DIAGNOSIS — D6859 Other primary thrombophilia: Secondary | ICD-10-CM

## 2014-02-22 DIAGNOSIS — I82409 Acute embolism and thrombosis of unspecified deep veins of unspecified lower extremity: Secondary | ICD-10-CM

## 2014-02-22 DIAGNOSIS — Z5181 Encounter for therapeutic drug level monitoring: Secondary | ICD-10-CM

## 2014-02-22 LAB — POCT INR: INR: 4.2

## 2014-02-22 NOTE — Progress Notes (Signed)
Pre visit review using our clinic review tool, if applicable. No additional management support is needed unless otherwise documented below in the visit note. 

## 2014-03-03 ENCOUNTER — Other Ambulatory Visit: Payer: Self-pay | Admitting: Internal Medicine

## 2014-03-15 ENCOUNTER — Other Ambulatory Visit: Payer: Self-pay | Admitting: Internal Medicine

## 2014-03-22 ENCOUNTER — Ambulatory Visit (INDEPENDENT_AMBULATORY_CARE_PROVIDER_SITE_OTHER): Payer: Medicare Other | Admitting: General Practice

## 2014-03-22 DIAGNOSIS — D6859 Other primary thrombophilia: Secondary | ICD-10-CM

## 2014-03-22 DIAGNOSIS — Z5181 Encounter for therapeutic drug level monitoring: Secondary | ICD-10-CM | POA: Diagnosis not present

## 2014-03-22 DIAGNOSIS — I82409 Acute embolism and thrombosis of unspecified deep veins of unspecified lower extremity: Secondary | ICD-10-CM

## 2014-03-22 LAB — POCT INR: INR: 3.1

## 2014-03-22 NOTE — Progress Notes (Signed)
Pre visit review using our clinic review tool, if applicable. No additional management support is needed unless otherwise documented below in the visit note. 

## 2014-03-26 ENCOUNTER — Other Ambulatory Visit: Payer: Self-pay | Admitting: Internal Medicine

## 2014-04-09 ENCOUNTER — Telehealth: Payer: Self-pay | Admitting: Internal Medicine

## 2014-04-09 ENCOUNTER — Other Ambulatory Visit: Payer: Self-pay | Admitting: Internal Medicine

## 2014-04-09 MED ORDER — VITAMIN D (ERGOCALCIFEROL) 1.25 MG (50000 UNIT) PO CAPS: ORAL_CAPSULE | ORAL | Status: DC

## 2014-04-09 NOTE — Telephone Encounter (Signed)
[facility] [place], [place] - [address] Is requesting re-fill on Vitamin D, Ergocalciferol, (DRISDOL) 50000 UNITS CAPS capsule

## 2014-04-09 NOTE — Telephone Encounter (Signed)
Refill OK

## 2014-04-09 NOTE — Telephone Encounter (Signed)
rx sent in electronically 

## 2014-04-09 NOTE — Telephone Encounter (Signed)
Please advise if we need to refill or if pt needs to start an OTC Vitamin D.  Thanks

## 2014-04-19 ENCOUNTER — Ambulatory Visit (INDEPENDENT_AMBULATORY_CARE_PROVIDER_SITE_OTHER): Payer: Medicare Other | Admitting: General Practice

## 2014-04-19 DIAGNOSIS — I82409 Acute embolism and thrombosis of unspecified deep veins of unspecified lower extremity: Secondary | ICD-10-CM | POA: Diagnosis not present

## 2014-04-19 DIAGNOSIS — D6859 Other primary thrombophilia: Secondary | ICD-10-CM

## 2014-04-19 DIAGNOSIS — Z5181 Encounter for therapeutic drug level monitoring: Secondary | ICD-10-CM

## 2014-04-19 LAB — POCT INR: INR: 4.5

## 2014-04-19 NOTE — Progress Notes (Signed)
Pre visit review using our clinic review tool, if applicable. No additional management support is needed unless otherwise documented below in the visit note. 

## 2014-04-21 ENCOUNTER — Other Ambulatory Visit: Payer: Self-pay | Admitting: Internal Medicine

## 2014-05-03 ENCOUNTER — Ambulatory Visit (INDEPENDENT_AMBULATORY_CARE_PROVIDER_SITE_OTHER): Payer: Medicare Other | Admitting: Family

## 2014-05-03 DIAGNOSIS — D6859 Other primary thrombophilia: Secondary | ICD-10-CM | POA: Diagnosis not present

## 2014-05-03 DIAGNOSIS — I82409 Acute embolism and thrombosis of unspecified deep veins of unspecified lower extremity: Secondary | ICD-10-CM | POA: Diagnosis not present

## 2014-05-03 DIAGNOSIS — Z5181 Encounter for therapeutic drug level monitoring: Secondary | ICD-10-CM

## 2014-05-03 DIAGNOSIS — I82401 Acute embolism and thrombosis of unspecified deep veins of right lower extremity: Secondary | ICD-10-CM

## 2014-05-03 LAB — POCT INR: INR: 3.2

## 2014-05-03 NOTE — Patient Instructions (Signed)
1 tablet all days except 1 1/2 tablets on tuesdays. Recheck in 4 weeks.   Anticoagulation Dose Instructions as of 05/03/2014     Dorene Grebe Tue Wed Thu Fri Sat   New Dose 5 mg 5 mg 7.5 mg 5 mg 5 mg 5 mg 5 mg    Description        1 tablet all days except 1 1/2 tablets on tuesdays. Recheck in 4 weeks.

## 2014-05-21 ENCOUNTER — Other Ambulatory Visit: Payer: Self-pay | Admitting: Internal Medicine

## 2014-05-29 ENCOUNTER — Ambulatory Visit (INDEPENDENT_AMBULATORY_CARE_PROVIDER_SITE_OTHER): Payer: Medicare Other | Admitting: Family Medicine

## 2014-05-29 ENCOUNTER — Encounter: Payer: Self-pay | Admitting: Family Medicine

## 2014-05-29 VITALS — BP 100/70 | HR 92 | Temp 97.7°F | Ht 64.5 in | Wt 195.0 lb

## 2014-05-29 DIAGNOSIS — J45901 Unspecified asthma with (acute) exacerbation: Secondary | ICD-10-CM

## 2014-05-29 DIAGNOSIS — J069 Acute upper respiratory infection, unspecified: Secondary | ICD-10-CM | POA: Diagnosis not present

## 2014-05-29 MED ORDER — BENZONATATE 100 MG PO CAPS: 100.0000 mg | ORAL_CAPSULE | Freq: Two times a day (BID) | ORAL | Status: DC | PRN

## 2014-05-29 MED ORDER — FLUTICASONE PROPIONATE HFA 44 MCG/ACT IN AERO: 2.0000 | INHALATION_SPRAY | Freq: Two times a day (BID) | RESPIRATORY_TRACT | Status: DC

## 2014-05-29 MED ORDER — AZITHROMYCIN 250 MG PO TABS: ORAL_TABLET | ORAL | Status: DC

## 2014-05-29 NOTE — Patient Instructions (Signed)
-  As we discussed, we have prescribed a few new medications (asithromycin, flovent inhaler and tessalon) for you at this appointment. We discussed the common and serious potential adverse effects of this medication and you can review these and more with the pharmacist when you pick up your medication.  Please follow the instructions for use carefully and notify us immediately if you have any problems taking this medication.  -follow up as needed and immediately if worsening or not improving

## 2014-05-29 NOTE — Progress Notes (Signed)
Pre visit review using our clinic review tool, if applicable. No additional management support is needed unless otherwise documented below in the visit note. 

## 2014-05-29 NOTE — Progress Notes (Signed)
No chief complaint on file.   HPI:  Acute visit for multiple new problems:  -PCP used to be Dr. Arnoldo Morale, now set up to see Dr. Yong Channel  1)URI: -started: 4 days ago -symptoms nasal congestion, productive cough, wheezing, drainage, sore throat, fever for a few days - now resolved, body aches -denies: SOB, wheezing, CP, pain in ears, pain in sinuses -son had strep throat last week and his kids are sick too and they have been sick with this -has tried:delsum, OTC medicaitons -hx of asthma   ROS: See pertinent positives and negatives per HPI.  Past Medical History  Diagnosis Date  . Thyroid disease   . GERD (gastroesophageal reflux disease)   . Hypertension   . Allergy   . Cancer     skin  . Primary hypercoagulable state     Past Surgical History  Procedure Laterality Date  . Cholecystectomy    . Abdominal hysterectomy    . Oophorectomy      Family History  Problem Relation Age of Onset  . Coronary artery disease      fhx  . Diabetes Mother   . Hypertension Father   . Diabetes Father     History   Social History  . Marital Status: Married    Spouse Name: N/A    Number of Children: N/A  . Years of Education: N/A   Social History Main Topics  . Smoking status: Never Smoker   . Smokeless tobacco: None  . Alcohol Use: No  . Drug Use: No  . Sexual Activity: Yes   Other Topics Concern  . None   Social History Narrative  . None    Current outpatient prescriptions:acetaminophen (TYLENOL) 325 MG tablet, Take 650 mg by mouth every 6 (six) hours as needed for mild pain or fever. , Disp: , Rfl: ;  Ascorbic Acid (VITAMIN C) 500 MG tablet, Take 500 mg by mouth daily.  , Disp: , Rfl: ;  BAYER CONTOUR TEST test strip, USE ONE STRIP ONCE DAILY., Disp: 100 each, Rfl: 0;  Calcium Citrate-Vitamin D 250-200 MG-UNIT TABS, Take 2 tablets by mouth daily. , Disp: , Rfl:  Cyanocobalamin (VITAMIN B12 PO), Take 1 tablet by mouth daily., Disp: , Rfl: ;  diltiazem (CARDIZEM CD) 360  MG 24 hr capsule, Take 360 mg by mouth daily., Disp: , Rfl: ;  folic acid (FOLVITE) 1 MG tablet, TAKE ONE TABLET BY MOUTH EVERY DAY., Disp: 90 tablet, Rfl: 3;  JANUMET XR 50-1000 MG TB24, TAKE ONE TABLET BY MOUTH ONCE DAILY WITH  BREAKFAST, Disp: 30 tablet, Rfl: 7 levothyroxine (SYNTHROID, LEVOTHROID) 100 MCG tablet, TAKE ONE TABLET BY MOUTH ONCE DAILY, Disp: 90 tablet, Rfl: 3;  metoprolol succinate (TOPROL-XL) 25 MG 24 hr tablet, TAKE ONE TABLET BY MOUTH ONCE DAILY, Disp: 90 tablet, Rfl: 0;  ranitidine (ZANTAC) 300 MG tablet, Take 300 mg by mouth at bedtime., Disp: , Rfl: ;  triamterene-hydrochlorothiazide (MAXZIDE-25) 37.5-25 MG per tablet, Take 1 tablet by mouth daily., Disp: , Rfl:  valsartan (DIOVAN) 320 MG tablet, Take 320 mg by mouth daily., Disp: , Rfl: ;  Vitamin D, Ergocalciferol, (DRISDOL) 50000 UNITS CAPS capsule, TAKE ONE CAPSULE BY MOUTH ONCE A WEEK, Disp: 12 capsule, Rfl: 0;  warfarin (COUMADIN) 5 MG tablet, Take as directed by anticoagulation clinic, Disp: 35 tablet, Rfl: 3;  azithromycin (ZITHROMAX) 250 MG tablet, 2 tabs on first day then 1 tab daily, Disp: 6 tablet, Rfl: 0 benzonatate (TESSALON) 100 MG capsule, Take 1 capsule (100  mg total) by mouth 2 (two) times daily as needed for cough., Disp: 20 capsule, Rfl: 0;  fluticasone (FLOVENT HFA) 44 MCG/ACT inhaler, Inhale 2 puffs into the lungs 2 (two) times daily., Disp: 1 Inhaler, Rfl: 0  EXAM:  Filed Vitals:   05/29/14 1259  BP: 100/70  Pulse: 92  Temp: 97.7 F (36.5 C)    Body mass index is 32.97 kg/(m^2).  GENERAL: vitals reviewed and listed above, alert, oriented, appears well hydrated and in no acute distress  HEENT: atraumatic, conjunttiva clear, no obvious abnormalities on inspection of external nose and ears, normal appearance of ear canals and TMs, clear nasal congestion, mild post oropharyngeal erythema with PND, no tonsillar edema or exudate, no sinus TTP  NECK: no obvious masses on inspection  LUNGS: clear to  auscultation bilaterally except for a few wheezes R base  CV: HRRR, no peripheral edema  MS: moves all extremities without noticeable abnormality  PSYCH: pleasant and cooperative, no obvious depression or anxiety  ASSESSMENT AND PLAN:  Discussed the following assessment and plan:  Upper respiratory infection - Plan: azithromycin (ZITHROMAX) 250 MG tablet, fluticasone (FLOVENT HFA) 44 MCG/ACT inhaler, benzonatate (TESSALON) 100 MG capsule  Asthma with acute exacerbation, unspecified asthma severity - Plan: azithromycin (ZITHROMAX) 250 MG tablet, fluticasone (FLOVENT HFA) 44 MCG/ACT inhaler, benzonatate (TESSALON) 100 MG capsule  -we discussed possible serious and likely etiologies, workup and treatment, treatment risks and return precautions -after this discussion, Erin Kirk opted for treatment with inhaled steroid (hates to take prednisone), azithromycin given her allergies, lung findings and strep exposure, tessalon -of course, we advised Erin Kirk  to return or notify a doctor immediately if symptoms worsen or persist or new concerns arise.  .  -Patient advised to return or notify a doctor immediately if symptoms worsen or persist or new concerns arise.  There are no Patient Instructions on file for this visit.   Colin Benton R.

## 2014-05-31 ENCOUNTER — Ambulatory Visit (INDEPENDENT_AMBULATORY_CARE_PROVIDER_SITE_OTHER): Payer: Medicare Other | Admitting: Family Medicine

## 2014-05-31 ENCOUNTER — Ambulatory Visit (INDEPENDENT_AMBULATORY_CARE_PROVIDER_SITE_OTHER): Payer: Medicare Other | Admitting: Family

## 2014-05-31 ENCOUNTER — Encounter: Payer: Self-pay | Admitting: Family Medicine

## 2014-05-31 ENCOUNTER — Ambulatory Visit (INDEPENDENT_AMBULATORY_CARE_PROVIDER_SITE_OTHER)
Admission: RE | Admit: 2014-05-31 | Discharge: 2014-05-31 | Disposition: A | Discharge status: Home / Self Care | Payer: Medicare Other | Source: Ambulatory Visit | Attending: Family Medicine | Admitting: Family Medicine

## 2014-05-31 VITALS — BP 110/80 | HR 87 | Temp 98.3°F | Ht 64.5 in

## 2014-05-31 DIAGNOSIS — R059 Cough, unspecified: Secondary | ICD-10-CM | POA: Diagnosis not present

## 2014-05-31 DIAGNOSIS — R05 Cough: Secondary | ICD-10-CM

## 2014-05-31 DIAGNOSIS — D6859 Other primary thrombophilia: Secondary | ICD-10-CM

## 2014-05-31 DIAGNOSIS — R0989 Other specified symptoms and signs involving the circulatory and respiratory systems: Secondary | ICD-10-CM | POA: Diagnosis not present

## 2014-05-31 DIAGNOSIS — J45901 Unspecified asthma with (acute) exacerbation: Secondary | ICD-10-CM

## 2014-05-31 DIAGNOSIS — Z5181 Encounter for therapeutic drug level monitoring: Secondary | ICD-10-CM | POA: Diagnosis not present

## 2014-05-31 LAB — POCT INR: INR: 3.9

## 2014-05-31 MED ORDER — PREDNISONE 20 MG PO TABS: 40.0000 mg | ORAL_TABLET | Freq: Every day | ORAL | Status: DC

## 2014-05-31 NOTE — Progress Notes (Signed)
Pre visit review using our clinic review tool, if applicable. No additional management support is needed unless otherwise documented below in the visit note. 

## 2014-05-31 NOTE — Patient Instructions (Signed)
-  As we discussed, we have prescribed a new medication for you at this appointment. We discussed the common and serious potential adverse effects of this medication and you can review these and more with the pharmacist when you pick up your medication.  Please follow the instructions for use carefully and notify us immediately if you have any problems taking this medication.  -complete antibiotic  -get chest xray  -seek car immediately if worsening or not improving  -if blood in sputum with cough call immediately and we need to do the CT scan

## 2014-05-31 NOTE — Patient Instructions (Addendum)
Hold Coumadin today. 1 tablet all days except 1 1/2 tablets on tuesdays. Recheck in 2 weeks.  Anticoagulation Dose Instructions as of 05/31/2014     Erin Kirk Tue Wed Thu Fri Sat   New Dose 5 mg 5 mg 7.5 mg 5 mg 5 mg 5 mg 5 mg    Description       Hold Coumadin today. 1 tablet all days except 1 1/2 tablets on tuesdays. Recheck in 2 weeks.

## 2014-05-31 NOTE — Progress Notes (Signed)
No chief complaint on file.   HPI:  Cough and congestion: -started about 6 days ago -symptoms: nasal congestion, productive cough, fever initially, malaise, diarrhea yesterday, mild SOB, streak of blood in mucus from nose from blowing and coughing, in sputum streak once several days ago -?hx asthma -seen several days ago and treated with azithromycin and inh steroid as she preferred not to do prednisone at the time - now wants to do prednisone -denies CP ROS: See pertinent positives and negatives per HPI.  Past Medical History  Diagnosis Date  . Thyroid disease   . GERD (gastroesophageal reflux disease)   . Hypertension   . Allergy   . Cancer     skin  . Primary hypercoagulable state     Past Surgical History  Procedure Laterality Date  . Cholecystectomy    . Abdominal hysterectomy    . Oophorectomy      Family History  Problem Relation Age of Onset  . Coronary artery disease      fhx  . Diabetes Mother   . Hypertension Father   . Diabetes Father     History   Social History  . Marital Status: Married    Spouse Name: N/A    Number of Children: N/A  . Years of Education: N/A   Social History Main Topics  . Smoking status: Never Smoker   . Smokeless tobacco: None  . Alcohol Use: No  . Drug Use: No  . Sexual Activity: Yes   Other Topics Concern  . None   Social History Narrative  . None    Current outpatient prescriptions:acetaminophen (TYLENOL) 325 MG tablet, Take 650 mg by mouth every 6 (six) hours as needed for mild pain or fever. , Disp: , Rfl: ;  Ascorbic Acid (VITAMIN C) 500 MG tablet, Take 500 mg by mouth daily.  , Disp: , Rfl: ;  azithromycin (ZITHROMAX) 250 MG tablet, 2 tabs on first day then 1 tab daily, Disp: 6 tablet, Rfl: 0;  BAYER CONTOUR TEST test strip, USE ONE STRIP ONCE DAILY., Disp: 100 each, Rfl: 0 benzonatate (TESSALON) 100 MG capsule, Take 1 capsule (100 mg total) by mouth 2 (two) times daily as needed for cough., Disp: 20 capsule,  Rfl: 0;  Calcium Citrate-Vitamin D 250-200 MG-UNIT TABS, Take 2 tablets by mouth daily. , Disp: , Rfl: ;  Cyanocobalamin (VITAMIN B12 PO), Take 1 tablet by mouth daily., Disp: , Rfl: ;  diltiazem (CARDIZEM CD) 360 MG 24 hr capsule, Take 360 mg by mouth daily., Disp: , Rfl:  fluticasone (FLOVENT HFA) 44 MCG/ACT inhaler, Inhale 2 puffs into the lungs 2 (two) times daily., Disp: 1 Inhaler, Rfl: 0;  folic acid (FOLVITE) 1 MG tablet, TAKE ONE TABLET BY MOUTH EVERY DAY., Disp: 90 tablet, Rfl: 3;  JANUMET XR 50-1000 MG TB24, TAKE ONE TABLET BY MOUTH ONCE DAILY WITH  BREAKFAST, Disp: 30 tablet, Rfl: 7;  levothyroxine (SYNTHROID, LEVOTHROID) 100 MCG tablet, TAKE ONE TABLET BY MOUTH ONCE DAILY, Disp: 90 tablet, Rfl: 3 metoprolol succinate (TOPROL-XL) 25 MG 24 hr tablet, TAKE ONE TABLET BY MOUTH ONCE DAILY, Disp: 90 tablet, Rfl: 0;  ranitidine (ZANTAC) 300 MG tablet, Take 300 mg by mouth at bedtime., Disp: , Rfl: ;  triamterene-hydrochlorothiazide (MAXZIDE-25) 37.5-25 MG per tablet, Take 1 tablet by mouth daily., Disp: , Rfl: ;  valsartan (DIOVAN) 320 MG tablet, Take 320 mg by mouth daily., Disp: , Rfl:  Vitamin D, Ergocalciferol, (DRISDOL) 50000 UNITS CAPS capsule, TAKE ONE CAPSULE BY MOUTH  ONCE A WEEK, Disp: 12 capsule, Rfl: 0;  warfarin (COUMADIN) 5 MG tablet, Take as directed by anticoagulation clinic, Disp: 35 tablet, Rfl: 3;  predniSONE (DELTASONE) 20 MG tablet, Take 2 tablets (40 mg total) by mouth daily with breakfast., Disp: 8 tablet, Rfl: 0  EXAM:  Filed Vitals:   05/31/14 0930  BP: 110/80  Pulse: 87  Temp: 98.3 F (36.8 C)   Body mass index is 0.00 kg/(m^2).  GENERAL: vitals reviewed and listed above, alert, oriented, appears well hydrated and in no acute distress  HEENT: atraumatic, conjunttiva clear, no obvious abnormalities on inspection of external nose and ears, normal appearance of ear canals and TMs, clear nasal congestion, irritation ant nose - healing well, mild post oropharyngeal erythema  with PND, no tonsillar edema or exudate, no sinus TTP  NECK: no obvious masses on inspection  LUNGS: clear to auscultation bilaterally, no wheezes, rales or rhonchi, good air movement, O2 on RA normal  CV: HRRR, no peripheral edema  MS: moves all extremities without noticeable abnormality  PSYCH: pleasant and cooperative, no obvious depression or anxiety  ASSESSMENT AND PLAN:  Discussed the following assessment and plan:  Asthmatic bronchitis with acute exacerbation - Plan: DG Chest 2 View, predniSONE (DELTASONE) 20 MG tablet  Cough - Plan: DG Chest 2 View, predniSONE (DELTASONE) 20 MG tablet  -likely viral URI with bronchitis - given ? Hx asthma treated with zpack and inh steroid as she refused oral prednisone initially -now wishes to do oral prednisone - risks discussed -discussed blood in mucus - likely from ant nose given mostly when blows nose, once in sputum a few days ago that she did not report at last visit - advised CT scan of chest today or certainly if sees any further blood in sputum - she opted for cxr and hold on CT scan for now -nose bleed precautions - none today -CXR -return and emergency precautions -coumadin followed in coumadin clinic -Patient advised to return or notify a doctor immediately if symptoms worsen or persist or new concerns arise.  Patient Instructions  -As we discussed, we have prescribed a new medication for you at this appointment. We discussed the common and serious potential adverse effects of this medication and you can review these and more with the pharmacist when you pick up your medication.  Please follow the instructions for use carefully and notify us immediately if you have any problems taking this medication.  -complete antibiotic  -get chest xray  -seek car immediately if worsening or not improving  -if blood in sputum with cough call immediately and we need to do the CT scan     KIM, Jarrett Soho R.

## 2014-06-04 ENCOUNTER — Telehealth: Payer: Self-pay | Admitting: Internal Medicine

## 2014-06-04 MED ORDER — VALSARTAN 320 MG PO TABS: 320.0000 mg | ORAL_TABLET | Freq: Every day | ORAL | Status: DC

## 2014-06-04 NOTE — Telephone Encounter (Signed)
[facility] [place], [place] - [address] Is requesting re-fill on valsartan (DIOVAN) 320 MG tablet

## 2014-06-04 NOTE — Telephone Encounter (Signed)
Medication sent in. 

## 2014-06-11 ENCOUNTER — Telehealth: Payer: Self-pay | Admitting: Internal Medicine

## 2014-06-11 MED ORDER — WARFARIN SODIUM 5 MG PO TABS: ORAL_TABLET | ORAL | Status: DC

## 2014-06-11 NOTE — Telephone Encounter (Signed)
[facility] [place], [place] - [address]  Is requesting re-fill on warfarin (COUMADIN) 5 MG tablet

## 2014-06-11 NOTE — Telephone Encounter (Signed)
Done

## 2014-06-12 ENCOUNTER — Ambulatory Visit: Payer: Self-pay | Admitting: Family Medicine

## 2014-06-13 ENCOUNTER — Ambulatory Visit: Payer: Self-pay | Admitting: Internal Medicine

## 2014-06-13 ENCOUNTER — Ambulatory Visit (INDEPENDENT_AMBULATORY_CARE_PROVIDER_SITE_OTHER): Payer: Medicare Other | Admitting: Family

## 2014-06-13 ENCOUNTER — Encounter: Payer: Self-pay | Admitting: Family Medicine

## 2014-06-13 ENCOUNTER — Ambulatory Visit (INDEPENDENT_AMBULATORY_CARE_PROVIDER_SITE_OTHER): Payer: Medicare Other | Admitting: Family Medicine

## 2014-06-13 VITALS — BP 98/80 | HR 60 | Temp 98.7°F | Wt 199.0 lb

## 2014-06-13 DIAGNOSIS — I4891 Unspecified atrial fibrillation: Secondary | ICD-10-CM

## 2014-06-13 DIAGNOSIS — E039 Hypothyroidism, unspecified: Secondary | ICD-10-CM | POA: Diagnosis not present

## 2014-06-13 DIAGNOSIS — E1165 Type 2 diabetes mellitus with hyperglycemia: Secondary | ICD-10-CM

## 2014-06-13 DIAGNOSIS — IMO0002 Reserved for concepts with insufficient information to code with codable children: Secondary | ICD-10-CM

## 2014-06-13 DIAGNOSIS — E1169 Type 2 diabetes mellitus with other specified complication: Secondary | ICD-10-CM

## 2014-06-13 DIAGNOSIS — D6859 Other primary thrombophilia: Secondary | ICD-10-CM

## 2014-06-13 DIAGNOSIS — Z23 Encounter for immunization: Secondary | ICD-10-CM

## 2014-06-13 DIAGNOSIS — Z5181 Encounter for therapeutic drug level monitoring: Secondary | ICD-10-CM

## 2014-06-13 DIAGNOSIS — I10 Essential (primary) hypertension: Secondary | ICD-10-CM | POA: Diagnosis not present

## 2014-06-13 DIAGNOSIS — I480 Paroxysmal atrial fibrillation: Secondary | ICD-10-CM

## 2014-06-13 LAB — COMPREHENSIVE METABOLIC PANEL
ALBUMIN: 3.9 g/dL (ref 3.5–5.2)
ALK PHOS: 58 U/L (ref 39–117)
ALT: 40 U/L — AB (ref 0–35)
AST: 27 U/L (ref 0–37)
BILIRUBIN TOTAL: 0.5 mg/dL (ref 0.2–1.2)
BUN: 16 mg/dL (ref 6–23)
CO2: 28 mEq/L (ref 19–32)
Calcium: 9.3 mg/dL (ref 8.4–10.5)
Chloride: 100 mEq/L (ref 96–112)
Creatinine, Ser: 1.2 mg/dL (ref 0.4–1.2)
GFR: 51.1 mL/min — ABNORMAL LOW (ref >60.00)
GLUCOSE: 104 mg/dL — AB (ref 70–99)
POTASSIUM: 4.3 meq/L (ref 3.5–5.1)
Sodium: 137 mEq/L (ref 135–145)
TOTAL PROTEIN: 7.6 g/dL (ref 6.0–8.3)

## 2014-06-13 LAB — HEMOGLOBIN A1C: HEMOGLOBIN A1C: 6.2 % (ref 4.6–6.5)

## 2014-06-13 LAB — POCT INR: INR: 4.3

## 2014-06-13 LAB — TSH: TSH: 0.36 u[IU]/mL (ref 0.35–4.50)

## 2014-06-13 NOTE — Assessment & Plan Note (Signed)
Stable on Coumadin. Continue. No signs of clot formation

## 2014-06-13 NOTE — Progress Notes (Signed)
Erin Reddish, MD Phone: [phone]  Subjective:  Patient presents today to establish care with me as their new primary care provider. Patient was formerly a patient of Dr. Arnoldo Morale. Chief complaint-noted.   Diabetes-stable Does not routinely check blood sugars but when she does usually under 150. On Janumet for many years and has been stable. Cost is over $25 a month though. Never been on max dose metformin Lab Results  Component Value Date   HGBA1C 6.1 12/08/2013  ROS-no hypoglycemia symptoms, no polyuria or polydipsia  Paroxysmal atrial fibrillation-stable Primary hypercoagulable state-stable Not currently in A. fib symptomatically. Rate control with diltiazem and metoprolol. Anticoagulation with Coumadin. Anticoagulation with dual-purpose due to hypercoagulable state. Patient seen by Coumadin clinic while she was here with adjustment of warfarin. ROS-no palpitations, no leg swelling no chest pain or shortness of breath   The following were reviewed and entered/updated in epic: Past Medical History  Diagnosis Date  . Thyroid disease   . GERD (gastroesophageal reflux disease)   . Hypertension   . Allergy   . Cancer     skin  . Primary hypercoagulable state    Patient Active Problem List   Diagnosis Date Noted  . Type II or unspecified type diabetes mellitus with other specified manifestations, uncontrolled 11/28/2010    Priority: High  . PAROXYSMAL ATRIAL FIBRILLATION 07/02/2010    Priority: High  . Primary hypercoagulable state 11/30/2007    Priority: High  . OSTEOPOROSIS NOS 05/25/2007    Priority: Medium  . HYPOTHYROIDISM 03/09/2007    Priority: Medium  . HYPERTENSION 03/09/2007    Priority: Medium  . History of Melanoma 03/09/2007    Priority: Medium  . Encounter for therapeutic drug monitoring 10/26/2013    Priority: Low  . Left facial pain 09/24/2012    Priority: Low  . Oral mucosal lesion 09/24/2012    Priority: Low  . TRANSIENT DISORDER  INITIATING/MAINTAINING SLEEP 04/02/2010    Priority: Low  . Overweight(278.02) 11/29/2009    Priority: Low  . LOW BACK PAIN 06/15/2008    Priority: Low  . ALLERGIC RHINITIS 03/09/2007    Priority: Low  . GERD 03/09/2007    Priority: Low   Past Surgical History  Procedure Laterality Date  . Cholecystectomy    . Abdominal hysterectomy      noncancerous  . Oophorectomy    . Melanoma removal      with right neck lymph nodes  . Back surgery  1983  . Fatty tumor breast      removed  . Knee arthroscopy      Family History  Problem Relation Age of Onset  . Coronary artery disease Mother     Mom 21, Dad 57-smoker  . Diabetes Mother   . Hypertension Father   . Diabetes Father     Medications- reviewed and updated Current Outpatient Prescriptions  Medication Sig Dispense Refill  . acetaminophen (TYLENOL) 325 MG tablet Take 650 mg by mouth every 6 (six) hours as needed for mild pain or fever.       . Ascorbic Acid (VITAMIN C) 500 MG tablet Take 500 mg by mouth daily.        Marland Kitchen BAYER CONTOUR TEST test strip USE ONE STRIP ONCE DAILY.  100 each  0  . Calcium Citrate-Vitamin D 250-200 MG-UNIT TABS Take 2 tablets by mouth daily.       . Cyanocobalamin (VITAMIN B12 PO) Take 1 tablet by mouth daily.      Marland Kitchen diltiazem (CARDIZEM CD) 360  MG 24 hr capsule Take 360 mg by mouth daily.      . folic acid (FOLVITE) 1 MG tablet TAKE ONE TABLET BY MOUTH EVERY DAY.  90 tablet  3  . JANUMET XR 50-1000 MG TB24 TAKE ONE TABLET BY MOUTH ONCE DAILY WITH  BREAKFAST  30 tablet  7  . levothyroxine (SYNTHROID, LEVOTHROID) 100 MCG tablet TAKE ONE TABLET BY MOUTH ONCE DAILY  90 tablet  3  . metoprolol succinate (TOPROL-XL) 25 MG 24 hr tablet TAKE ONE TABLET BY MOUTH ONCE DAILY  90 tablet  0  . ranitidine (ZANTAC) 300 MG tablet Take 300 mg by mouth at bedtime.      . triamterene-hydrochlorothiazide (MAXZIDE-25) 37.5-25 MG per tablet Take 1 tablet by mouth daily.      . valsartan (DIOVAN) 320 MG tablet Take 1  tablet (320 mg total) by mouth daily.  90 tablet  0  . Vitamin D, Ergocalciferol, (DRISDOL) 50000 UNITS CAPS capsule TAKE ONE CAPSULE BY MOUTH ONCE A WEEK  12 capsule  0  . warfarin (COUMADIN) 5 MG tablet Take as directed by anticoagulation clinic  35 tablet  3   No current facility-administered medications for this visit.    Allergies-reviewed and updated Allergies  Allergen Reactions  . Iohexol Anaphylaxis, Hives and Shortness Of Breath    Incident 1990, premedicated prior to obtaining IV contrast since.  . Amoxicillin-Pot Clavulanate Other (See Comments)    REACTION: unspecified  . Other     Contrast dye    History   Social History  . Marital Status: Married    Spouse Name: N/A    Number of Children: N/A  . Years of Education: N/A   Social History Main Topics  . Smoking status: Never Smoker   . Smokeless tobacco: None  . Alcohol Use: No  . Drug Use: No  . Sexual Activity: Yes   Other Topics Concern  . None   Social History Narrative   Married 1971. 1 child Erin Kirk (by [employer]) lives in [place] with 2 grandchildren girls 8,6 in 2015.       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     ROS--See HPI   Objective: BP 98/80  Pulse 60  Temp(Src) 98.7 F (37.1 C)  Wt 199 lb (90.266 kg) Gen: NAD, resting comfortably, no lightheadedness with standing HEENT: Mucous membranes are moist.  CV: RRR no murmurs rubs or gallops Lungs: CTAB no crackles, wheeze, rhonchi Abdomen: soft/nontender/nondistended/normal bowel sounds.  Ext: no edema Skin: warm, dry, no rash Neuro: grossly normal, moves all extremities, PERRLA   Assessment/Plan:  Type II or unspecified type diabetes mellitus with other specified manifestations, uncontrolled Previously well-controlled on Janumet. If A1c less than 6.5 plan to convert to Max dose metformin to save costs. If elevated would focus on diet and exercise and  reconsider this transition in 3 months  Primary hypercoagulable state Stable on Coumadin. Continue. No signs of clot formation  PAROXYSMAL ATRIAL FIBRILLATION Anticoagulation with Coumadin. Rate control with diltiazem and metoprolol. Continue all 3 medications   Health Maintenance Due  Topic Date Due  . Pneumococcal Polysaccharide Vaccine (##1)- 1 month from now at coumadin clinic [birth year]1956  . Ophthalmology Exam -April of this year 02/18/1964  . Mammogram -at [facility] 04/17/2011  . Tetanus/tdap -next visit with me 09/21/2013  . Zostavax -defer til 63 02/17/2014  . Hemoglobin A1c -today 06/10/2014   Nonfasting labs  Orders Placed This Encounter  Procedures  . Comprehensive metabolic panel    [place]  . Hemoglobin A1c    [place]  . TSH    [place]

## 2014-06-13 NOTE — Assessment & Plan Note (Signed)
Anticoagulation with Coumadin. Rate control with diltiazem and metoprolol. Continue all 3 medications

## 2014-06-13 NOTE — Assessment & Plan Note (Signed)
Previously well-controlled on Janumet. If A1c less than 6.5 plan to convert to Max dose metformin to save costs. If elevated would focus on diet and exercise and reconsider this transition in 3 months

## 2014-06-13 NOTE — Patient Instructions (Signed)
Hold Coumadin Wednesday. Take 1/2 tablet on Thursday. Then decrease weekly dosage to  1 tablet everyday. Recheck in 2 weeks.   Anticoagulation Dose Instructions as of 06/13/2014     Dorene Grebe Tue Wed Thu Fri Sat   New Dose 5 mg 5 mg 5 mg 5 mg 5 mg 5 mg 5 mg    Description       Hold Coumadin Wednesday. Take 1/2 tablet on Thursday. Then decrease weekly dosage to  1 tablet everyday. Recheck in 2 weeks.

## 2014-06-13 NOTE — Patient Instructions (Signed)
Labs today including a1c  If a1c <6.5, we will increase your metformin to full dose (with new prescription) and trial off janumet with recheck in 3 months.   Health Maintenance Due  Topic Date Due  . Pneumococcal Polysaccharide Vaccine (##1)- 1 month from now at coumadin clinic 02[birth year]18[birth year]1956  . Ophthalmology Exam -April of this year 02/18/1964  . Mammogram -at [facility] 04/17/2011  . Tetanus/tdap -next visit with me 09/21/2013  . Zostavax -defer til 63 02/17/2014  . Hemoglobin A1c -today

## 2014-06-14 ENCOUNTER — Telehealth: Payer: Self-pay | Admitting: Family Medicine

## 2014-06-14 MED ORDER — METFORMIN HCL 1000 MG PO TABS: 1000.0000 mg | ORAL_TABLET | Freq: Two times a day (BID) | ORAL | Status: DC

## 2014-06-14 NOTE — Telephone Encounter (Signed)
Called and LM for pt TCB 

## 2014-06-14 NOTE — Telephone Encounter (Signed)
A1c looked great at 6.2. Let's try changing to metformin 1g twice a day. She can continue Janumet once a day in the morning and take 1/2 of a metformin for a week then start full pill in 1 week. When she runs out of Janumet, start 1g metformin in morning in place of Janumet for total of 2g a day.   Kidney function does show some age related changes with Chronic kidney Disease Stage III which has been seen on prior labs. Would avoid ibuprofen/aleve. Can take tylenol as needed for pain. When I see her back, I may take her off of triamterine (1 of 2 medicines in maxzide).   Thyroid test was fine.   Follow up in 3 months.

## 2014-06-15 NOTE — Telephone Encounter (Signed)
Pt.notified

## 2014-06-28 ENCOUNTER — Ambulatory Visit: Payer: Self-pay

## 2014-06-28 ENCOUNTER — Ambulatory Visit (INDEPENDENT_AMBULATORY_CARE_PROVIDER_SITE_OTHER): Payer: Medicare Other | Admitting: Family

## 2014-06-28 DIAGNOSIS — D6852 Prothrombin gene mutation: Secondary | ICD-10-CM | POA: Diagnosis not present

## 2014-06-28 DIAGNOSIS — Z5181 Encounter for therapeutic drug level monitoring: Secondary | ICD-10-CM

## 2014-06-28 DIAGNOSIS — D6859 Other primary thrombophilia: Secondary | ICD-10-CM

## 2014-06-28 LAB — POCT INR: INR: 2.9

## 2014-06-28 NOTE — Patient Instructions (Signed)
Continue  1 tablet everyday. Recheck in 4 weeks.   Anticoagulation Dose Instructions as of 06/28/2014     Dorene Grebe Tue Wed Thu Fri Sat   New Dose 5 mg 5 mg 5 mg 5 mg 5 mg 5 mg 5 mg    Description       Continue  1 tablet everyday. Recheck in 4 weeks.

## 2014-07-11 ENCOUNTER — Telehealth: Payer: Self-pay | Admitting: Family Medicine

## 2014-07-11 MED ORDER — VITAMIN D (ERGOCALCIFEROL) 1.25 MG (50000 UNIT) PO CAPS: ORAL_CAPSULE | ORAL | Status: DC

## 2014-07-11 NOTE — Telephone Encounter (Signed)
[facility] [place], [place] - [address] Is requesting re-fill on Vitamin D, Ergocalciferol, (DRISDOL) 50000 UNITS CAPS capsule

## 2014-07-11 NOTE — Telephone Encounter (Signed)
Medication filled.  

## 2014-07-26 ENCOUNTER — Ambulatory Visit (INDEPENDENT_AMBULATORY_CARE_PROVIDER_SITE_OTHER): Payer: Medicare Other | Admitting: Family

## 2014-07-26 ENCOUNTER — Ambulatory Visit (INDEPENDENT_AMBULATORY_CARE_PROVIDER_SITE_OTHER): Payer: Medicare Other | Admitting: Family Medicine

## 2014-07-26 DIAGNOSIS — D6859 Other primary thrombophilia: Secondary | ICD-10-CM

## 2014-07-26 DIAGNOSIS — Z23 Encounter for immunization: Secondary | ICD-10-CM

## 2014-07-26 DIAGNOSIS — Z5181 Encounter for therapeutic drug level monitoring: Secondary | ICD-10-CM

## 2014-07-26 DIAGNOSIS — D6852 Prothrombin gene mutation: Secondary | ICD-10-CM

## 2014-07-26 LAB — POCT INR: INR: 2.7

## 2014-07-26 NOTE — Patient Instructions (Signed)
Continue  1 tablet everyday. Recheck in 4 weeks.   Anticoagulation Dose Instructions as of 07/26/2014      Erin Kirk Tue Wed Thu Fri Sat   New Dose 5 mg 5 mg 5 mg 5 mg 5 mg 5 mg 5 mg    Description        Continue  1 tablet everyday. Recheck in 4 weeks.

## 2014-08-20 ENCOUNTER — Telehealth: Payer: Self-pay | Admitting: Family Medicine

## 2014-08-20 MED ORDER — METOPROLOL SUCCINATE ER 25 MG PO TB24: 25.0000 mg | ORAL_TABLET | Freq: Every day | ORAL | Status: DC

## 2014-08-20 NOTE — Telephone Encounter (Signed)
Medication refilled

## 2014-08-20 NOTE — Telephone Encounter (Signed)
METOPROLOL ER 25 MG call into [facility]. Pt is out

## 2014-08-23 ENCOUNTER — Ambulatory Visit (INDEPENDENT_AMBULATORY_CARE_PROVIDER_SITE_OTHER): Payer: Medicare Other | Admitting: Family

## 2014-08-23 DIAGNOSIS — D6859 Other primary thrombophilia: Secondary | ICD-10-CM

## 2014-08-23 DIAGNOSIS — Z5181 Encounter for therapeutic drug level monitoring: Secondary | ICD-10-CM | POA: Diagnosis not present

## 2014-08-23 DIAGNOSIS — D6852 Prothrombin gene mutation: Secondary | ICD-10-CM | POA: Diagnosis not present

## 2014-08-23 LAB — POCT INR: INR: 2.5

## 2014-08-23 NOTE — Patient Instructions (Signed)
Continue  1 tablet everyday. Recheck in 6 weeks.   Anticoagulation Dose Instructions as of 08/23/2014      Erin Kirk Tue Wed Thu Fri Sat   New Dose 5 mg 5 mg 5 mg 5 mg 5 mg 5 mg 5 mg    Description        Continue  1 tablet everyday. Recheck in 6 weeks.

## 2014-08-29 ENCOUNTER — Other Ambulatory Visit: Payer: Self-pay | Admitting: Family Medicine

## 2014-09-05 ENCOUNTER — Ambulatory Visit (INDEPENDENT_AMBULATORY_CARE_PROVIDER_SITE_OTHER): Payer: Medicare Other | Admitting: Family Medicine

## 2014-09-05 ENCOUNTER — Encounter: Payer: Self-pay | Admitting: Family Medicine

## 2014-09-05 VITALS — BP 108/70 | Temp 98.2°F | Wt 195.0 lb

## 2014-09-05 DIAGNOSIS — E119 Type 2 diabetes mellitus without complications: Secondary | ICD-10-CM | POA: Diagnosis not present

## 2014-09-05 DIAGNOSIS — N183 Chronic kidney disease, stage 3 unspecified: Secondary | ICD-10-CM | POA: Insufficient documentation

## 2014-09-05 NOTE — Progress Notes (Signed)
Garret Reddish, MD Phone: [phone]  Subjective:   Erin Kirk is a 58 y.o. year old very pleasant female patient who presents with the following:  DIABETES Type II-well controlled   Obesity-improving with 5 lb weight loss Lab Results  Component Value Date   HGBA1C 6.2 06/13/2014   HGBA1C 6.1 12/08/2013   HGBA1C 6.2 06/29/2013   Medications taking and tolerating-yes, metformin 1g BID Blood Sugars per patient-fasting-3x per week around100 or less Diet-doing well with portion control. Was doing walking 30 minutes at a friends house. Does some recumbent bike at home.  Regular Exercise-had been doing well,but planning onrestarting On Aspirin-coumadin so no aspirin On statin-no, LDL 63 without medicine Daily foot monitoring-yes  ROS- Denies Polyuria,Polydipsia, nocturia, Vision changes, feet or hand numbness/pain/tingling. Denies  Hypoglycemia symptoms (shaky, sweaty, hungry, weak anxious, tremor, palpitations, confusion, behavior change).   CKD stage III-new diagnosis Not taking nsaids. Known diabetes. Already on valsartan for BP.  ROS- no urinary changes, dysuria.   Past Medical History- Patient Active Problem List   Diagnosis Date Noted  . Type II diabetes mellitus, well controlled 11/28/2010    Priority: High  . PAROXYSMAL ATRIAL FIBRILLATION 07/02/2010    Priority: High  . Primary hypercoagulable state 11/30/2007    Priority: High  . CKD (chronic kidney disease), stage III 09/05/2014    Priority: Medium  . OSTEOPOROSIS NOS 05/25/2007    Priority: Medium  . HYPOTHYROIDISM 03/09/2007    Priority: Medium  . HYPERTENSION 03/09/2007    Priority: Medium  . History of Melanoma 03/09/2007    Priority: Medium  . Encounter for therapeutic drug monitoring 10/26/2013    Priority: Low  . Left facial pain 09/24/2012    Priority: Low  . Oral mucosal lesion 09/24/2012    Priority: Low  . TRANSIENT DISORDER INITIATING/MAINTAINING SLEEP 04/02/2010    Priority: Low  .  Overweight(278.02) 11/29/2009    Priority: Low  . LOW BACK PAIN 06/15/2008    Priority: Low  . ALLERGIC RHINITIS 03/09/2007    Priority: Low  . GERD 03/09/2007    Priority: Low   Medications- reviewed and updated Current Outpatient Prescriptions  Medication Sig Dispense Refill  . acetaminophen (TYLENOL) 325 MG tablet Take 650 mg by mouth every 6 (six) hours as needed for mild pain or fever.     . Ascorbic Acid (VITAMIN C) 500 MG tablet Take 500 mg by mouth daily.      Marland Kitchen BAYER CONTOUR TEST test strip USE ONE STRIP ONCE DAILY. 100 each 0  . Calcium Citrate-Vitamin D 250-200 MG-UNIT TABS Take 2 tablets by mouth daily.     . Cyanocobalamin (VITAMIN B12 PO) Take 1 tablet by mouth daily.    . folic acid (FOLVITE) 1 MG tablet TAKE ONE TABLET BY MOUTH EVERY DAY. 90 tablet 3  . levothyroxine (SYNTHROID, LEVOTHROID) 100 MCG tablet TAKE ONE TABLET BY MOUTH ONCE DAILY 90 tablet 3  . metFORMIN (GLUCOPHAGE) 1000 MG tablet Take 1 tablet (1,000 mg total) by mouth 2 (two) times daily with a meal. 60 tablet 5  . metoprolol succinate (TOPROL-XL) 25 MG 24 hr tablet Take 1 tablet (25 mg total) by mouth daily. 90 tablet 0  . ranitidine (ZANTAC) 300 MG tablet Take 300 mg by mouth at bedtime.    . triamterene-hydrochlorothiazide (MAXZIDE-25) 37.5-25 MG per tablet Take 1 tablet by mouth daily.    . valsartan (DIOVAN) 320 MG tablet TAKE ONE TABLET BY MOUTH ONCE DAILY 90 tablet 0  . Vitamin D, Ergocalciferol, (  DRISDOL) 50000 UNITS CAPS capsule TAKE ONE CAPSULE BY MOUTH ONCE A WEEK 12 capsule 1  . warfarin (COUMADIN) 5 MG tablet Take as directed by anticoagulation clinic 35 tablet 3  . diltiazem (CARDIZEM CD) 360 MG 24 hr capsule Take 360 mg by mouth daily.     No current facility-administered medications for this visit.    Objective: BP 108/70 mmHg  Temp(Src) 98.2 F (36.8 C)  Wt 195 lb (88.451 kg) Gen: NAD, resting comfortably CV: RRR no murmurs rubs or gallops (no a fib detected) Lungs: CTAB no  crackles, wheeze, rhonchi Abdomen: soft/nontender/nondistended/normal bowel sounds.  Ext: no edema Skin: warm, dry, no rash  Neuro: grossly normal, moves all extremities  Normal foot exam DM   Assessment/Plan:  CKD (chronic kidney disease), stage III Repeat bmet. GFR in 50-60 range. Continue valsartan. Discussed considering coming off triamterine if potassium ever trends up. Avoid nsaids.   Type II diabetes mellitus, well controlled Janumet (6.1 a1c)--> metformin alone due to cost (a1c 6.2). Continue metformin 1g BID. Side effects of diarrhea have subsided with time.  Lab Results  Component Value Date   HGBA1C 6.2 06/13/2014    Return precautions advised. 4 month f/u.   Labs 1 week before next visit, not fasting Orders Placed This Encounter  Procedures  . Basic metabolic panel    [place]    Standing Status: Future     Number of Occurrences:      Standing Expiration Date: 09/06/2015  . Hemoglobin A1c    [place]    Standing Status: Future     Number of Occurrences:      Standing Expiration Date: 09/06/2015

## 2014-09-05 NOTE — Assessment & Plan Note (Signed)
Repeat bmet. GFR in 50-60 range. Continue valsartan. Discussed considering coming off triamterine if potassium ever trends up. Avoid nsaids.

## 2014-09-05 NOTE — Assessment & Plan Note (Signed)
Janumet (6.1 a1c)--> metformin alone due to cost (a1c 6.2). Continue metformin 1g BID. Side effects of diarrhea have subsided with time.  Lab Results  Component Value Date   HGBA1C 6.2 06/13/2014

## 2014-09-05 NOTE — Patient Instructions (Addendum)
Great job losing 5 lbs!   Hopeful a1c remains below 7 now on only 1 medicine.   Recheck kidney function  Follow up in 4-6 months if labs look great

## 2014-09-11 ENCOUNTER — Other Ambulatory Visit: Payer: Self-pay | Admitting: *Deleted

## 2014-09-11 MED ORDER — TRIAMTERENE-HCTZ 37.5-25 MG PO TABS: 1.0000 | ORAL_TABLET | Freq: Every day | ORAL | Status: DC

## 2014-09-11 MED ORDER — RANITIDINE HCL 300 MG PO TABS: 300.0000 mg | ORAL_TABLET | Freq: Every day | ORAL | Status: DC

## 2014-09-20 ENCOUNTER — Other Ambulatory Visit: Payer: Self-pay | Admitting: Family Medicine

## 2014-09-20 MED ORDER — DILTIAZEM HCL ER COATED BEADS 360 MG PO CP24: 360.0000 mg | ORAL_CAPSULE | Freq: Every day | ORAL | Status: DC

## 2014-09-20 NOTE — Telephone Encounter (Signed)
Rx sent to pharmacy   

## 2014-09-20 NOTE — Telephone Encounter (Signed)
[facility] [place],  - [address] [phone] is requesting re-fill on diltiazem (CARDIZEM CD) 360 MG 24 hr capsule

## 2014-10-04 ENCOUNTER — Ambulatory Visit (INDEPENDENT_AMBULATORY_CARE_PROVIDER_SITE_OTHER): Payer: Medicare Other | Admitting: Family

## 2014-10-04 ENCOUNTER — Other Ambulatory Visit (INDEPENDENT_AMBULATORY_CARE_PROVIDER_SITE_OTHER): Payer: Medicare Other

## 2014-10-04 DIAGNOSIS — E119 Type 2 diabetes mellitus without complications: Secondary | ICD-10-CM

## 2014-10-04 DIAGNOSIS — D6859 Other primary thrombophilia: Secondary | ICD-10-CM

## 2014-10-04 DIAGNOSIS — D6852 Prothrombin gene mutation: Secondary | ICD-10-CM | POA: Diagnosis not present

## 2014-10-04 DIAGNOSIS — Z5181 Encounter for therapeutic drug level monitoring: Secondary | ICD-10-CM

## 2014-10-04 LAB — BASIC METABOLIC PANEL
BUN: 16 mg/dL (ref 6–23)
CHLORIDE: 102 meq/L (ref 96–112)
CO2: 31 meq/L (ref 19–32)
Calcium: 9.4 mg/dL (ref 8.4–10.5)
Creatinine, Ser: 0.83 mg/dL (ref 0.40–1.20)
GFR: 74.37 mL/min (ref >60.00)
GLUCOSE: 101 mg/dL — AB (ref 70–99)
Potassium: 3.9 mEq/L (ref 3.5–5.1)
Sodium: 138 mEq/L (ref 135–145)

## 2014-10-04 LAB — HEMOGLOBIN A1C: HEMOGLOBIN A1C: 6 % (ref 4.6–6.5)

## 2014-10-04 LAB — POCT INR: INR: 2

## 2014-10-04 NOTE — Patient Instructions (Signed)
Take an extra 1/2 tab today and tomorrow. Then continue  1 tablet everyday. Recheck in 3 weeks.   Anticoagulation Dose Instructions as of 10/04/2014      Dorene Grebe Tue Wed Thu Fri Sat   New Dose 5 mg 5 mg 5 mg 5 mg 5 mg 5 mg 5 mg    Description        Take an extra 1/2 tab today and tomorrow. Then continue  1 tablet everyday. Recheck in 3 weeks.

## 2014-10-25 ENCOUNTER — Ambulatory Visit (INDEPENDENT_AMBULATORY_CARE_PROVIDER_SITE_OTHER): Payer: Medicare Other | Admitting: General Practice

## 2014-10-25 DIAGNOSIS — D6859 Other primary thrombophilia: Secondary | ICD-10-CM

## 2014-10-25 DIAGNOSIS — Z5181 Encounter for therapeutic drug level monitoring: Secondary | ICD-10-CM | POA: Diagnosis not present

## 2014-10-25 DIAGNOSIS — D6852 Prothrombin gene mutation: Secondary | ICD-10-CM | POA: Diagnosis not present

## 2014-10-25 LAB — POCT INR: INR: 3

## 2014-10-25 NOTE — Progress Notes (Signed)
Pre visit review using our clinic review tool, if applicable. No additional management support is needed unless otherwise documented below in the visit note. 

## 2014-10-29 ENCOUNTER — Other Ambulatory Visit: Payer: Self-pay | Admitting: Family

## 2014-11-08 ENCOUNTER — Telehealth: Payer: Self-pay

## 2014-11-08 MED ORDER — FOLIC ACID 1 MG PO TABS: 1.0000 mg | ORAL_TABLET | Freq: Every day | ORAL | Status: DC

## 2014-11-08 NOTE — Telephone Encounter (Signed)
Medication refilled

## 2014-11-08 NOTE — Telephone Encounter (Signed)
[facility] refill request for FOLIC ACID 1MG  TAB #90

## 2014-11-17 ENCOUNTER — Other Ambulatory Visit: Payer: Self-pay | Admitting: Family Medicine

## 2014-11-22 ENCOUNTER — Ambulatory Visit (INDEPENDENT_AMBULATORY_CARE_PROVIDER_SITE_OTHER): Payer: Medicare Other | Admitting: General Practice

## 2014-11-22 DIAGNOSIS — D6852 Prothrombin gene mutation: Secondary | ICD-10-CM | POA: Diagnosis not present

## 2014-11-22 DIAGNOSIS — D6859 Other primary thrombophilia: Secondary | ICD-10-CM

## 2014-11-22 DIAGNOSIS — Z5181 Encounter for therapeutic drug level monitoring: Secondary | ICD-10-CM | POA: Diagnosis not present

## 2014-11-22 LAB — POCT INR: INR: 2.2

## 2014-11-22 NOTE — Progress Notes (Signed)
Pre visit review using our clinic review tool, if applicable. No additional management support is needed unless otherwise documented below in the visit note. 

## 2014-11-28 ENCOUNTER — Other Ambulatory Visit: Payer: Self-pay | Admitting: Family Medicine

## 2014-12-11 ENCOUNTER — Other Ambulatory Visit: Payer: Self-pay | Admitting: Family Medicine

## 2014-12-20 ENCOUNTER — Ambulatory Visit (INDEPENDENT_AMBULATORY_CARE_PROVIDER_SITE_OTHER): Payer: Medicare Other | Admitting: General Practice

## 2014-12-20 DIAGNOSIS — D6852 Prothrombin gene mutation: Secondary | ICD-10-CM | POA: Diagnosis not present

## 2014-12-20 DIAGNOSIS — Z5181 Encounter for therapeutic drug level monitoring: Secondary | ICD-10-CM | POA: Diagnosis not present

## 2014-12-20 DIAGNOSIS — D6859 Other primary thrombophilia: Secondary | ICD-10-CM

## 2014-12-20 LAB — POCT INR: INR: 2

## 2014-12-20 NOTE — Progress Notes (Signed)
Pre visit review using our clinic review tool, if applicable. No additional management support is needed unless otherwise documented below in the visit note. 

## 2014-12-24 ENCOUNTER — Other Ambulatory Visit: Payer: Self-pay | Admitting: Family Medicine

## 2014-12-28 DIAGNOSIS — Z9889 Other specified postprocedural states: Secondary | ICD-10-CM | POA: Diagnosis not present

## 2014-12-28 DIAGNOSIS — Z483 Aftercare following surgery for neoplasm: Secondary | ICD-10-CM | POA: Diagnosis not present

## 2014-12-28 DIAGNOSIS — Z08 Encounter for follow-up examination after completed treatment for malignant neoplasm: Secondary | ICD-10-CM | POA: Diagnosis not present

## 2014-12-28 DIAGNOSIS — Z1231 Encounter for screening mammogram for malignant neoplasm of breast: Secondary | ICD-10-CM | POA: Diagnosis not present

## 2014-12-28 DIAGNOSIS — R928 Other abnormal and inconclusive findings on diagnostic imaging of breast: Secondary | ICD-10-CM | POA: Diagnosis not present

## 2014-12-28 DIAGNOSIS — C439 Malignant melanoma of skin, unspecified: Secondary | ICD-10-CM | POA: Diagnosis not present

## 2014-12-28 DIAGNOSIS — Z0389 Encounter for observation for other suspected diseases and conditions ruled out: Secondary | ICD-10-CM | POA: Diagnosis not present

## 2014-12-28 DIAGNOSIS — Z85820 Personal history of malignant melanoma of skin: Secondary | ICD-10-CM | POA: Diagnosis not present

## 2014-12-28 LAB — CBC AND DIFFERENTIAL
HCT: 39 % (ref 36–46)
HEMOGLOBIN: 13.4 g/dL (ref 12.0–16.0)
Platelets: 250 10*3/uL (ref 150–399)
WBC: 6.9 10^3/mL

## 2014-12-28 LAB — BASIC METABOLIC PANEL
BUN: 14 mg/dL (ref 4–21)
Creatinine: 0.9 mg/dL (ref <=1.1)
Glucose: 88 mg/dL
Potassium: 4.3 mmol/L (ref 3.4–5.3)
Sodium: 140 mmol/L (ref 137–147)

## 2014-12-28 LAB — HM MAMMOGRAPHY: HM MAMMO: ABNORMAL

## 2014-12-28 LAB — HEPATIC FUNCTION PANEL
ALT: 30 U/L (ref 7–35)
AST: 24 U/L (ref 13–35)

## 2015-01-01 ENCOUNTER — Encounter: Payer: Self-pay | Admitting: Family Medicine

## 2015-01-07 ENCOUNTER — Ambulatory Visit (INDEPENDENT_AMBULATORY_CARE_PROVIDER_SITE_OTHER): Payer: Medicare Other | Admitting: Family Medicine

## 2015-01-07 ENCOUNTER — Encounter: Payer: Self-pay | Admitting: Family Medicine

## 2015-01-07 VITALS — BP 122/78 | HR 75 | Temp 97.9°F | Wt 191.0 lb

## 2015-01-07 DIAGNOSIS — I480 Paroxysmal atrial fibrillation: Secondary | ICD-10-CM

## 2015-01-07 DIAGNOSIS — D6852 Prothrombin gene mutation: Secondary | ICD-10-CM | POA: Diagnosis not present

## 2015-01-07 DIAGNOSIS — E119 Type 2 diabetes mellitus without complications: Secondary | ICD-10-CM

## 2015-01-07 DIAGNOSIS — D6859 Other primary thrombophilia: Secondary | ICD-10-CM

## 2015-01-07 DIAGNOSIS — E039 Hypothyroidism, unspecified: Secondary | ICD-10-CM

## 2015-01-07 DIAGNOSIS — Z20828 Contact with and (suspected) exposure to other viral communicable diseases: Secondary | ICD-10-CM

## 2015-01-07 LAB — TSH: TSH: 1.02 u[IU]/mL (ref 0.35–4.50)

## 2015-01-07 LAB — HEMOGLOBIN A1C: Hgb A1c MFr Bld: 5.7 % (ref 4.6–6.5)

## 2015-01-07 NOTE — Progress Notes (Signed)
Garret Reddish, MD Phone: [phone]  Subjective:   Erin Kirk is a 59 y.o. year old very pleasant female patient who presents with the following:  See problem oriented charting ROS- no calf or arm pain or swelling particularly unilaterally. No chest pain, shortness of breath, palpitations. No blurry vision or hypoglycemia. No hair or nail changes. No heat/cold intolerance. No constipation or diarrhea. Denies shakiness or anxiety.   Past Medical History- Patient Active Problem List   Diagnosis Date Noted  . Type II diabetes mellitus, well controlled 11/28/2010    Priority: High  . PAROXYSMAL ATRIAL FIBRILLATION 07/02/2010    Priority: High  . Primary hypercoagulable state 11/30/2007    Priority: High  . CKD (chronic kidney disease), stage III 09/05/2014    Priority: Medium  . OSTEOPOROSIS NOS 05/25/2007    Priority: Medium  . Hypothyroidism 03/09/2007    Priority: Medium  . HYPERTENSION 03/09/2007    Priority: Medium  . History of Melanoma 03/09/2007    Priority: Medium  . Encounter for therapeutic drug monitoring 10/26/2013    Priority: Low  . Left facial pain 09/24/2012    Priority: Low  . Oral mucosal lesion 09/24/2012    Priority: Low  . TRANSIENT DISORDER INITIATING/MAINTAINING SLEEP 04/02/2010    Priority: Low  . Overweight(278.02) 11/29/2009    Priority: Low  . LOW BACK PAIN 06/15/2008    Priority: Low  . ALLERGIC RHINITIS 03/09/2007    Priority: Low  . GERD 03/09/2007    Priority: Low   Medications- reviewed and updated Current Outpatient Prescriptions  Medication Sig Dispense Refill  . Ascorbic Acid (VITAMIN C) 500 MG tablet Take 500 mg by mouth daily.      Marland Kitchen BAYER CONTOUR TEST test strip USE ONE STRIP ONCE DAILY. 100 each 0  . Calcium Citrate-Vitamin D 250-200 MG-UNIT TABS Take 2 tablets by mouth daily.     . Cyanocobalamin (VITAMIN B12 PO) Take 1 tablet by mouth daily.    Marland Kitchen diltiazem (CARDIZEM CD) 360 MG 24 hr capsule Take 1 capsule (360 mg  total) by mouth daily. 90 capsule 1  . folic acid (FOLVITE) 1 MG tablet Take 1 tablet (1 mg total) by mouth daily. 90 tablet 1  . levothyroxine (SYNTHROID, LEVOTHROID) 100 MCG tablet TAKE ONE TABLET BY MOUTH ONCE DAILY 90 tablet 3  . metFORMIN (GLUCOPHAGE) 1000 MG tablet TAKE ONE TABLET BY MOUTH TWICE DAILY WITH A MEAL 60 tablet 3  . metoprolol succinate (TOPROL-XL) 25 MG 24 hr tablet TAKE ONE TABLET BY MOUTH ONCE DAILY 90 tablet 0  . ranitidine (ZANTAC) 300 MG tablet Take 1 tablet (300 mg total) by mouth at bedtime. 90 tablet 3  . triamterene-hydrochlorothiazide (MAXZIDE-25) 37.5-25 MG per tablet Take 1 tablet by mouth daily. 90 tablet 3  . valsartan (DIOVAN) 320 MG tablet TAKE ONE TABLET BY MOUTH ONCE DAILY 90 tablet 1  . Vitamin D, Ergocalciferol, (DRISDOL) 50000 UNITS CAPS capsule TAKE ONE CAPSULE BY MOUTH ONCE A WEEK 12 capsule 2  . warfarin (COUMADIN) 5 MG tablet TAKE AS DIRECTED BY ANTICOAGULATION CLINIC 35 tablet 3  . acetaminophen (TYLENOL) 325 MG tablet Take 650 mg by mouth every 6 (six) hours as needed for mild pain or fever.      Objective: BP 122/78 mmHg  Pulse 75  Temp(Src) 97.9 F (36.6 C)  Wt 191 lb (86.637 kg) Gen: NAD, resting comfortably CV: RRR no murmurs rubs or gallops (not in a fib today) Lungs: CTAB no crackles, wheeze, rhonchi Abdomen:  soft/nontender/nondistended/normal bowel sounds.  Ext: no edema Skin: warm, dry, no rash  Neuro: grossly normal, moves all extremities   Assessment/Plan:  Primary hypercoagulable state S: history of DVT. None in last 20 years while on coumadin. Lupus hypercoagulability syndrome. A/P: continue coumadin at our INR clinic    PAROXYSMAL ATRIAL FIBRILLATION S: rate controlled on dilt and metoprolol but appears in sinus today. Anticoagulated on coumadin. Denies recent palpitations.  A/P: continue current therapy-doing well.    Type II diabetes mellitus, well controlled S: tolerating metformin alone. Has done well off  janumet A/P: check a1c today and continue current therapy as long as a1c <7.  May check a1c in 8 months if <6.5.    Hypothyroidism S: doing well on Levothyroxine 123mcg A/P: check tsh today- suspect controlled    3-4 month follow up  Orders Placed This Encounter  Procedures  . Hemoglobin A1c    [place]  . TSH    [place]  . HIV antibody    [facility]

## 2015-01-07 NOTE — Assessment & Plan Note (Addendum)
S: tolerating metformin alone. Has done well off janumet A/P: check a1c today and continue current therapy as long as a1c <7.  May check a1c in 8 months if <6.5.

## 2015-01-07 NOTE — Assessment & Plan Note (Signed)
S: doing well on Levothyroxine 140mcg A/P: check tsh today- suspect controlled

## 2015-01-07 NOTE — Patient Instructions (Addendum)
Get eye exam scheduled and fax record to [place] at [phone].  Update your a1c, thyroid today, 1x screening for HIV given history of blood transfusion in the past.   Check in 3-4 months from now

## 2015-01-07 NOTE — Assessment & Plan Note (Signed)
S: rate controlled on dilt and metoprolol but appears in sinus today. Anticoagulated on coumadin. Denies recent palpitations.  A/P: continue current therapy-doing well.

## 2015-01-07 NOTE — Assessment & Plan Note (Signed)
S: history of DVT. None in last 20 years while on coumadin. Lupus hypercoagulability syndrome. A/P: continue coumadin at our INR clinic

## 2015-01-08 LAB — HIV ANTIBODY (ROUTINE TESTING W REFLEX): HIV 1&2 Ab, 4th Generation: NONREACTIVE

## 2015-01-17 ENCOUNTER — Ambulatory Visit (INDEPENDENT_AMBULATORY_CARE_PROVIDER_SITE_OTHER): Payer: Medicare Other | Admitting: General Practice

## 2015-01-17 ENCOUNTER — Encounter: Payer: Self-pay | Admitting: Family Medicine

## 2015-01-17 DIAGNOSIS — Z5181 Encounter for therapeutic drug level monitoring: Secondary | ICD-10-CM

## 2015-01-17 DIAGNOSIS — D6852 Prothrombin gene mutation: Secondary | ICD-10-CM | POA: Diagnosis not present

## 2015-01-17 DIAGNOSIS — D6859 Other primary thrombophilia: Secondary | ICD-10-CM

## 2015-01-17 DIAGNOSIS — Z0271 Encounter for disability determination: Secondary | ICD-10-CM | POA: Insufficient documentation

## 2015-01-17 LAB — POCT INR: INR: 2.4

## 2015-01-17 NOTE — Progress Notes (Signed)
Pre visit review using our clinic review tool, if applicable. No additional management support is needed unless otherwise documented below in the visit note. 

## 2015-01-17 NOTE — Patient Instructions (Signed)
Skip coumadin on Monday 2nd due to dental visit on the 3rd. Take 1 1/2 tablets after dental cleaning on the 3rd and then Resume current dosage.

## 2015-01-18 ENCOUNTER — Telehealth: Payer: Self-pay | Admitting: Family Medicine

## 2015-01-18 NOTE — Telephone Encounter (Signed)
Pt is asking if paper work is ready .She said [name] called her last night.

## 2015-01-18 NOTE — Telephone Encounter (Signed)
Pt also request paper chart records to be present at the time of her visit on 01/28/15.

## 2015-01-18 NOTE — Telephone Encounter (Signed)
Paper work is ready and up front

## 2015-01-28 ENCOUNTER — Ambulatory Visit (INDEPENDENT_AMBULATORY_CARE_PROVIDER_SITE_OTHER): Payer: Medicare Other | Admitting: Family Medicine

## 2015-01-28 ENCOUNTER — Encounter: Payer: Self-pay | Admitting: Family Medicine

## 2015-01-28 VITALS — BP 112/74 | HR 83 | Temp 97.7°F | Wt 188.0 lb

## 2015-01-28 DIAGNOSIS — F411 Generalized anxiety disorder: Secondary | ICD-10-CM

## 2015-01-28 NOTE — Patient Instructions (Signed)
Return for regularly scheduled follow up

## 2015-01-28 NOTE — Progress Notes (Signed)
Garret Reddish, MD  Subjective:  Erin Kirk is a 59 y.o. year old very pleasant female patient who presents for counseling in regards to previous hurts by medical field, her concerns about her disability paperwork  ROS- No SI/HI  Past Medical History- primary hypercoaguable state, a fib, Type II diabetes, disabled, hypothyroidism, hypertension, history melanoma, history of low back pain and surgery, history of depression and anxiety in face of medical illness and life stressors in past.   Medications- reviewed and updated Current Outpatient Prescriptions  Medication Sig Dispense Refill  . Ascorbic Acid (VITAMIN C) 500 MG tablet Take 500 mg by mouth daily.      Marland Kitchen BAYER CONTOUR TEST test strip USE ONE STRIP ONCE DAILY. 100 each 0  . Calcium Citrate-Vitamin D 250-200 MG-UNIT TABS Take 2 tablets by mouth daily.     . Cyanocobalamin (VITAMIN B12 PO) Take 1 tablet by mouth daily.    Marland Kitchen diltiazem (CARDIZEM CD) 360 MG 24 hr capsule Take 1 capsule (360 mg total) by mouth daily. 90 capsule 1  . folic acid (FOLVITE) 1 MG tablet Take 1 tablet (1 mg total) by mouth daily. 90 tablet 1  . levothyroxine (SYNTHROID, LEVOTHROID) 100 MCG tablet TAKE ONE TABLET BY MOUTH ONCE DAILY 90 tablet 3  . metFORMIN (GLUCOPHAGE) 1000 MG tablet TAKE ONE TABLET BY MOUTH TWICE DAILY WITH A MEAL 60 tablet 3  . metoprolol succinate (TOPROL-XL) 25 MG 24 hr tablet TAKE ONE TABLET BY MOUTH ONCE DAILY 90 tablet 0  . ranitidine (ZANTAC) 300 MG tablet Take 1 tablet (300 mg total) by mouth at bedtime. 90 tablet 3  . triamterene-hydrochlorothiazide (MAXZIDE-25) 37.5-25 MG per tablet Take 1 tablet by mouth daily. 90 tablet 3  . valsartan (DIOVAN) 320 MG tablet TAKE ONE TABLET BY MOUTH ONCE DAILY 90 tablet 1  . Vitamin D, Ergocalciferol, (DRISDOL) 50000 UNITS CAPS capsule TAKE ONE CAPSULE BY MOUTH ONCE A WEEK 12 capsule 2  . warfarin (COUMADIN) 5 MG tablet TAKE AS DIRECTED BY ANTICOAGULATION CLINIC 35 tablet 3  . acetaminophen  (TYLENOL) 325 MG tablet Take 650 mg by mouth every 6 (six) hours as needed for mild pain or fever.      Objective: BP 112/74 mmHg  Pulse 83  Temp(Src) 97.7 F (36.5 C)  Wt 188 lb (85.276 kg) Gen: NAD, resting comfortably Psych: anxious, saddened when talking about prior health history  Assessment/Plan:  The entirety of this visit was spent counseling patient about prior difficulties she had with medical field and her concerns about that and disability. We focused primarily on counseling and working on the process of forgiveness as well as alleviating her concerns about the care I would provide. >50% of 25 minute office visit (1:55- 2:20 pm) was spent on counseling and coordination of care.

## 2015-01-29 DIAGNOSIS — Z124 Encounter for screening for malignant neoplasm of cervix: Secondary | ICD-10-CM | POA: Diagnosis not present

## 2015-01-29 DIAGNOSIS — Z01419 Encounter for gynecological examination (general) (routine) without abnormal findings: Secondary | ICD-10-CM | POA: Diagnosis not present

## 2015-01-31 DIAGNOSIS — Z85820 Personal history of malignant melanoma of skin: Secondary | ICD-10-CM | POA: Diagnosis not present

## 2015-01-31 DIAGNOSIS — D485 Neoplasm of uncertain behavior of skin: Secondary | ICD-10-CM | POA: Diagnosis not present

## 2015-01-31 DIAGNOSIS — D2222 Melanocytic nevi of left ear and external auricular canal: Secondary | ICD-10-CM | POA: Diagnosis not present

## 2015-02-04 ENCOUNTER — Ambulatory Visit (INDEPENDENT_AMBULATORY_CARE_PROVIDER_SITE_OTHER): Payer: Medicare Other | Admitting: General Practice

## 2015-02-04 DIAGNOSIS — D6859 Other primary thrombophilia: Secondary | ICD-10-CM

## 2015-02-04 DIAGNOSIS — D6852 Prothrombin gene mutation: Secondary | ICD-10-CM

## 2015-02-04 DIAGNOSIS — Z5181 Encounter for therapeutic drug level monitoring: Secondary | ICD-10-CM | POA: Diagnosis not present

## 2015-02-04 LAB — POCT INR: INR: 2.8

## 2015-02-04 NOTE — Progress Notes (Signed)
Pre visit review using our clinic review tool, if applicable. No additional management support is needed unless otherwise documented below in the visit note. 

## 2015-02-12 DIAGNOSIS — D485 Neoplasm of uncertain behavior of skin: Secondary | ICD-10-CM | POA: Diagnosis not present

## 2015-02-12 DIAGNOSIS — Z85820 Personal history of malignant melanoma of skin: Secondary | ICD-10-CM | POA: Diagnosis not present

## 2015-02-12 DIAGNOSIS — D2222 Melanocytic nevi of left ear and external auricular canal: Secondary | ICD-10-CM | POA: Diagnosis not present

## 2015-02-14 ENCOUNTER — Other Ambulatory Visit: Payer: Self-pay | Admitting: Family Medicine

## 2015-03-02 ENCOUNTER — Other Ambulatory Visit: Payer: Self-pay | Admitting: Family

## 2015-03-04 ENCOUNTER — Other Ambulatory Visit: Payer: Self-pay | Admitting: General Practice

## 2015-03-04 MED ORDER — WARFARIN SODIUM 5 MG PO TABS: ORAL_TABLET | ORAL | Status: DC

## 2015-03-07 ENCOUNTER — Ambulatory Visit (INDEPENDENT_AMBULATORY_CARE_PROVIDER_SITE_OTHER): Payer: Medicare Other | Admitting: General Practice

## 2015-03-07 DIAGNOSIS — D6852 Prothrombin gene mutation: Secondary | ICD-10-CM

## 2015-03-07 DIAGNOSIS — D6859 Other primary thrombophilia: Secondary | ICD-10-CM

## 2015-03-07 DIAGNOSIS — Z5181 Encounter for therapeutic drug level monitoring: Secondary | ICD-10-CM | POA: Diagnosis not present

## 2015-03-07 LAB — POCT INR: INR: 3

## 2015-03-07 NOTE — Progress Notes (Signed)
Pre visit review using our clinic review tool, if applicable. No additional management support is needed unless otherwise documented below in the visit note. 

## 2015-03-08 ENCOUNTER — Other Ambulatory Visit: Payer: Self-pay | Admitting: *Deleted

## 2015-03-08 MED ORDER — GLUCOSE BLOOD VI STRP: ORAL_STRIP | Status: DC

## 2015-03-17 ENCOUNTER — Other Ambulatory Visit: Payer: Self-pay | Admitting: Family Medicine

## 2015-03-18 ENCOUNTER — Encounter: Payer: Self-pay | Admitting: *Deleted

## 2015-04-02 ENCOUNTER — Other Ambulatory Visit: Payer: Self-pay | Admitting: *Deleted

## 2015-04-03 MED ORDER — LEVOTHYROXINE SODIUM 100 MCG PO TABS: 100.0000 ug | ORAL_TABLET | Freq: Every day | ORAL | Status: DC

## 2015-04-12 ENCOUNTER — Other Ambulatory Visit: Payer: Self-pay | Admitting: Family Medicine

## 2015-04-18 ENCOUNTER — Ambulatory Visit (INDEPENDENT_AMBULATORY_CARE_PROVIDER_SITE_OTHER): Payer: Medicare Other | Admitting: General Practice

## 2015-04-18 DIAGNOSIS — D6852 Prothrombin gene mutation: Secondary | ICD-10-CM | POA: Diagnosis not present

## 2015-04-18 DIAGNOSIS — D6859 Other primary thrombophilia: Secondary | ICD-10-CM

## 2015-04-18 DIAGNOSIS — Z5181 Encounter for therapeutic drug level monitoring: Secondary | ICD-10-CM

## 2015-04-18 LAB — POCT INR: INR: 2.5

## 2015-04-18 NOTE — Progress Notes (Signed)
Pre visit review using our clinic review tool, if applicable. No additional management support is needed unless otherwise documented below in the visit note. 

## 2015-05-01 ENCOUNTER — Other Ambulatory Visit: Payer: Self-pay | Admitting: Family Medicine

## 2015-05-12 ENCOUNTER — Other Ambulatory Visit: Payer: Self-pay | Admitting: Family Medicine

## 2015-05-27 ENCOUNTER — Other Ambulatory Visit: Payer: Self-pay | Admitting: Family Medicine

## 2015-05-30 ENCOUNTER — Ambulatory Visit (INDEPENDENT_AMBULATORY_CARE_PROVIDER_SITE_OTHER): Payer: Medicare Other | Admitting: General Practice

## 2015-05-30 DIAGNOSIS — Z5181 Encounter for therapeutic drug level monitoring: Secondary | ICD-10-CM | POA: Diagnosis not present

## 2015-05-30 DIAGNOSIS — D6852 Prothrombin gene mutation: Secondary | ICD-10-CM | POA: Diagnosis not present

## 2015-05-30 DIAGNOSIS — D6859 Other primary thrombophilia: Secondary | ICD-10-CM

## 2015-05-30 LAB — POCT INR: INR: 3

## 2015-05-30 NOTE — Progress Notes (Signed)
Pre visit review using our clinic review tool, if applicable. No additional management support is needed unless otherwise documented below in the visit note. 

## 2015-06-14 ENCOUNTER — Encounter: Payer: Self-pay | Admitting: Family Medicine

## 2015-06-14 ENCOUNTER — Ambulatory Visit (INDEPENDENT_AMBULATORY_CARE_PROVIDER_SITE_OTHER): Payer: Medicare Other | Admitting: Family Medicine

## 2015-06-14 VITALS — BP 122/80 | HR 75 | Temp 98.4°F | Wt 190.0 lb

## 2015-06-14 DIAGNOSIS — I10 Essential (primary) hypertension: Secondary | ICD-10-CM | POA: Diagnosis not present

## 2015-06-14 DIAGNOSIS — I480 Paroxysmal atrial fibrillation: Secondary | ICD-10-CM

## 2015-06-14 DIAGNOSIS — E119 Type 2 diabetes mellitus without complications: Secondary | ICD-10-CM

## 2015-06-14 DIAGNOSIS — Z23 Encounter for immunization: Secondary | ICD-10-CM

## 2015-06-14 NOTE — Assessment & Plan Note (Signed)
S: controlled on Triamterene-hctz, valsartan, diltiazem, metoprolol BP Readings from Last 3 Encounters:  06/14/15 122/80  01/28/15 112/74  01/07/15 122/78  A/P: check bmet next visit given on triamterine and valsartan but has done well on this with hctz in combo. Continue current rx.

## 2015-06-14 NOTE — Patient Instructions (Addendum)
Flu shot received today.  Hepatitis C and a1c should be included with labs at next visit  Colonoscopy- you are due (great job remembering this!) Let's schedule that next visit given all you have going on  No changes in meds otherwise  Get that eye exam before year end if you can  See me in 4 months

## 2015-06-14 NOTE — Assessment & Plan Note (Signed)
S: controlled. On metformin alone Lab Results  Component Value Date   HGBA1C 5.7 01/07/2015  A/P:Continue current meds:  We decided to get a1c at next visit given excellent control. Has gained 2 lbs- going to work on this

## 2015-06-14 NOTE — Assessment & Plan Note (Signed)
S: sounds to be in sinus again today. Usually Rate controlled even in a fib ondiltiazem and metoprolol. Compliant with coumadin A/P: continue current rx

## 2015-06-14 NOTE — Progress Notes (Signed)
Garret Reddish, MD  Subjective:  Erin Kirk is a 59 y.o. year old very pleasant female patient who presents for/with See problem oriented charting ROS- no chest pain or shortness or breath, rare palpitations, no extremity swelling, no hypoglycemia or blurry vision  Past Medical History-  Patient Active Problem List   Diagnosis Date Noted  . Disability examination 01/17/2015    Priority: High  . Type II diabetes mellitus, well controlled 11/28/2010    Priority: High  . PAROXYSMAL ATRIAL FIBRILLATION 07/02/2010    Priority: High  . Primary hypercoagulable state 11/30/2007    Priority: High  . CKD (chronic kidney disease), stage III 09/05/2014    Priority: Medium  . OSTEOPOROSIS NOS 05/25/2007    Priority: Medium  . Hypothyroidism 03/09/2007    Priority: Medium  . Essential hypertension 03/09/2007    Priority: Medium  . History of Melanoma 03/09/2007    Priority: Medium  . Encounter for therapeutic drug monitoring 10/26/2013    Priority: Low  . Left facial pain 09/24/2012    Priority: Low  . Oral mucosal lesion 09/24/2012    Priority: Low  . TRANSIENT DISORDER INITIATING/MAINTAINING SLEEP 04/02/2010    Priority: Low  . Overweight(278.02) 11/29/2009    Priority: Low  . LOW BACK PAIN 06/15/2008    Priority: Low  . ALLERGIC RHINITIS 03/09/2007    Priority: Low  . GERD 03/09/2007    Priority: Low    Medications- reviewed and updated Current Outpatient Prescriptions  Medication Sig Dispense Refill  . Ascorbic Acid (VITAMIN C) 500 MG tablet Take 500 mg by mouth daily.      . Calcium Citrate-Vitamin D 250-200 MG-UNIT TABS Take 2 tablets by mouth daily.     . Cyanocobalamin (VITAMIN B12 PO) Take 1 tablet by mouth daily.    Marland Kitchen diltiazem (CARDIZEM CD) 360 MG 24 hr capsule TAKE ONE CAPSULE BY MOUTH ONCE DAILY 90 capsule 3  . folic acid (FOLVITE) 1 MG tablet TAKE ONE TABLET BY MOUTH ONCE DAILY 90 tablet 6  . glucose blood (BAYER CONTOUR TEST) test strip Test glucose once  daily.  Dx E11.9 100 each 0  . levothyroxine (SYNTHROID, LEVOTHROID) 100 MCG tablet Take 1 tablet (100 mcg total) by mouth daily. 90 tablet 1  . metFORMIN (GLUCOPHAGE) 1000 MG tablet TAKE ONE TABLET BY MOUTH TWICE DAILY WITH A MEAL 60 tablet 6  . metoprolol succinate (TOPROL-XL) 25 MG 24 hr tablet TAKE ONE TABLET BY MOUTH ONCE DAILY 90 tablet 3  . ranitidine (ZANTAC) 300 MG tablet Take 1 tablet (300 mg total) by mouth at bedtime. 90 tablet 3  . triamterene-hydrochlorothiazide (MAXZIDE-25) 37.5-25 MG per tablet Take 1 tablet by mouth daily. 90 tablet 3  . valsartan (DIOVAN) 320 MG tablet TAKE ONE TABLET BY MOUTH ONCE DAILY 90 tablet 3  . Vitamin D, Ergocalciferol, (DRISDOL) 50000 UNITS CAPS capsule TAKE ONE CAPSULE BY MOUTH ONCE A WEEK 12 capsule 2  . warfarin (COUMADIN) 5 MG tablet Take as directed by anticoagulation clinic 35 tablet 6  . acetaminophen (TYLENOL) 325 MG tablet Take 650 mg by mouth every 6 (six) hours as needed for mild pain or fever.      No current facility-administered medications for this visit.    Objective: BP 122/80 mmHg  Pulse 75  Temp(Src) 98.4 F (36.9 C)  Wt 190 lb (86.183 kg) Gen: NAD, resting comfortably CV: RRR (does not sound to be in a fib)no murmurs rubs or gallops Lungs: CTAB no crackles, wheeze, rhonchi Abdomen:  soft/nontender/nondistended/normal bowel sounds. No rebound or guarding.  Ext: no edema, 2+ DP pulses Skin: warm, dry, no rash Neuro: grossly normal, moves all extremities  Assessment/Plan:  Type II diabetes mellitus, well controlled S: controlled. On metformin alone Lab Results  Component Value Date   HGBA1C 5.7 01/07/2015  A/P:Continue current meds:  We decided to get a1c at next visit given excellent control. Has gained 2 lbs- going to work on this   [place] S: sounds to be in sinus again today. Usually Rate controlled even in a fib ondiltiazem and metoprolol. Compliant with coumadin A/P: continue current  rx   Essential hypertension S: controlled on Triamterene-hctz, valsartan, diltiazem, metoprolol BP Readings from Last 3 Encounters:  06/14/15 122/80  01/28/15 112/74  01/07/15 122/78  A/P: check bmet next visit given on triamterine and valsartan but has done well on this with hctz in combo. Continue current rx.    4 month. Patient has had a lot of tough events- [employer], Good friend with cancer died around age 59, niece with uterine cancer- trying to support her sister who lost daughter  Orders Placed This Encounter  Procedures  . Flu Vaccine QUAD 36+ mos IM   Health Maintenance Due  Topic Date Due  . Hepatitis C Screening - agrees to with next labs 08/30/54  . TETANUS/TDAP - medicare doesn't cover 09/21/2013  . OPHTHALMOLOGY EXAM - agrees to by end of year 01/04/2015

## 2015-07-11 ENCOUNTER — Ambulatory Visit (INDEPENDENT_AMBULATORY_CARE_PROVIDER_SITE_OTHER): Payer: Medicare Other | Admitting: General Practice

## 2015-07-11 ENCOUNTER — Ambulatory Visit: Payer: Self-pay

## 2015-07-11 DIAGNOSIS — D6859 Other primary thrombophilia: Secondary | ICD-10-CM

## 2015-07-11 DIAGNOSIS — Z5181 Encounter for therapeutic drug level monitoring: Secondary | ICD-10-CM

## 2015-07-11 LAB — POCT INR: INR: 2.8

## 2015-07-11 NOTE — Progress Notes (Signed)
Pre visit review using our clinic review tool, if applicable. No additional management support is needed unless otherwise documented below in the visit note. 

## 2015-07-29 ENCOUNTER — Ambulatory Visit (INDEPENDENT_AMBULATORY_CARE_PROVIDER_SITE_OTHER): Payer: Medicare Other | Admitting: Family Medicine

## 2015-07-29 ENCOUNTER — Encounter: Payer: Self-pay | Admitting: Family Medicine

## 2015-07-29 VITALS — BP 118/70 | HR 81 | Temp 99.0°F | Wt 187.0 lb

## 2015-07-29 DIAGNOSIS — B9789 Other viral agents as the cause of diseases classified elsewhere: Secondary | ICD-10-CM

## 2015-07-29 DIAGNOSIS — J329 Chronic sinusitis, unspecified: Secondary | ICD-10-CM

## 2015-07-29 DIAGNOSIS — B349 Viral infection, unspecified: Secondary | ICD-10-CM | POA: Diagnosis not present

## 2015-07-29 NOTE — Patient Instructions (Addendum)
Viral sinus infection Schedule tylenol 650 or 1000mg  3x a day- will help with throat pain, lip pain likely as well  Canker sores- trial anbesol for at least temporary relief. If the lesions spread need to reevaluate   2 things I need you to call me for definitely: 1. Symptoms get better then get worse 2. Symptoms are not improving by Friday  In either case I would call you in an antibiotic- likely azithromycin since you have augmentin allergy

## 2015-07-29 NOTE — Progress Notes (Signed)
PCP: Garret Reddish, MD  Subjective:  Erin Kirk is a 59 y.o. year old very pleasant female patient who presents with concern of sinusitis Friday evening started with sinus headache and pressure. Complains of sinus congestion and thick drainage sometimes with yellow or green.  Sore throat as well Coughing some as well - dry. No new medicine recently. Sores on inside of mouth- no new medicine. Purple then red and then some white spots. Did change toothpaste 2 months ago. No lip or tongue swelling. Salt walter gargles help. Spots in mouth started before cold last Wednesday. Worsening symptoms. Fatigue.  ROS-No fever, chills, vomiting. Some nausea.   Pertinent Past Medical History-  Patient Active Problem List   Diagnosis Date Noted  . Disability examination 01/17/2015    Priority: High  . Type II diabetes mellitus, well controlled ([place]) 11/28/2010    Priority: High  . PAROXYSMAL ATRIAL FIBRILLATION 07/02/2010    Priority: High  . Primary hypercoagulable state ([place]) 11/30/2007    Priority: High  . CKD (chronic kidney disease), stage III 09/05/2014    Priority: Medium  . OSTEOPOROSIS NOS 05/25/2007    Priority: Medium  . Hypothyroidism 03/09/2007    Priority: Medium  . Essential hypertension 03/09/2007    Priority: Medium  . History of Melanoma 03/09/2007    Priority: Medium  . Encounter for therapeutic drug monitoring 10/26/2013    Priority: Low  . Left facial pain 09/24/2012    Priority: Low  . Oral mucosal lesion 09/24/2012    Priority: Low  . TRANSIENT DISORDER INITIATING/MAINTAINING SLEEP 04/02/2010    Priority: Low  . Overweight(278.02) 11/29/2009    Priority: Low  . LOW BACK PAIN 06/15/2008    Priority: Low  . ALLERGIC RHINITIS 03/09/2007    Priority: Low  . GERD 03/09/2007    Priority: Low   Medications- reviewed  Current Outpatient Prescriptions  Medication Sig Dispense Refill  . Ascorbic Acid (VITAMIN C) 500 MG tablet Take 500 mg by mouth daily.      .  Calcium Citrate-Vitamin D 250-200 MG-UNIT TABS Take 2 tablets by mouth daily.     . Cyanocobalamin (VITAMIN B12 PO) Take 1 tablet by mouth daily.    Marland Kitchen diltiazem (CARDIZEM CD) 360 MG 24 hr capsule TAKE ONE CAPSULE BY MOUTH ONCE DAILY 90 capsule 3  . folic acid (FOLVITE) 1 MG tablet TAKE ONE TABLET BY MOUTH ONCE DAILY 90 tablet 6  . glucose blood (BAYER CONTOUR TEST) test strip Test glucose once daily.  Dx E11.9 100 each 0  . levothyroxine (SYNTHROID, LEVOTHROID) 100 MCG tablet Take 1 tablet (100 mcg total) by mouth daily. 90 tablet 1  . metFORMIN (GLUCOPHAGE) 1000 MG tablet TAKE ONE TABLET BY MOUTH TWICE DAILY WITH A MEAL 60 tablet 6  . metoprolol succinate (TOPROL-XL) 25 MG 24 hr tablet TAKE ONE TABLET BY MOUTH ONCE DAILY 90 tablet 3  . ranitidine (ZANTAC) 300 MG tablet Take 1 tablet (300 mg total) by mouth at bedtime. 90 tablet 3  . triamterene-hydrochlorothiazide (MAXZIDE-25) 37.5-25 MG per tablet Take 1 tablet by mouth daily. 90 tablet 3  . valsartan (DIOVAN) 320 MG tablet TAKE ONE TABLET BY MOUTH ONCE DAILY 90 tablet 3  . Vitamin D, Ergocalciferol, (DRISDOL) 50000 UNITS CAPS capsule TAKE ONE CAPSULE BY MOUTH ONCE A WEEK 12 capsule 2  . warfarin (COUMADIN) 5 MG tablet Take as directed by anticoagulation clinic 35 tablet 6  . acetaminophen (TYLENOL) 325 MG tablet Take 650 mg by mouth every 6 (  six) hours as needed for mild pain or fever.      No current facility-administered medications for this visit.    Objective: BP 118/70 mmHg  Pulse 81  Temp(Src) 99 F (37.2 C)  Wt 187 lb (84.823 kg)  SpO2 96% Gen: NAD, resting comfortably HEENT: Turbinates erythematous, TM normal, pharynx mildly erythematous with no tonsilar exudate,  Sinuses tender maxillary > frontal . On inside of lip has 2 small ulcers side by side (canker sores) that are tender to touch CV: RRR no murmurs rubs or gallops Lungs: CTAB no crackles, wheeze, rhonchi Abdomen: soft/nontender/nondistended/normal bowel sounds. No  rebound or guarding.  Ext: no edema Skin: warm, dry, no rash Neuro: grossly normal, moves all extremities  Assessment/Plan:  Viral sinusitis Discussed given only day 4 of illness- too early to tell if bacterial given no double sickening. She was given strict warning precautions per AVS  2 things I need you to call me for definitely: 1. Symptoms get better then get worse 2. Symptoms are not improving by Friday  In either case I would call you in an antibiotic- likely azithromycin since you have augmentin allergy  Return precautions advised beyond antibiotic course as well

## 2015-08-07 DIAGNOSIS — D1801 Hemangioma of skin and subcutaneous tissue: Secondary | ICD-10-CM | POA: Diagnosis not present

## 2015-08-07 DIAGNOSIS — D2271 Melanocytic nevi of right lower limb, including hip: Secondary | ICD-10-CM | POA: Diagnosis not present

## 2015-08-07 DIAGNOSIS — D2272 Melanocytic nevi of left lower limb, including hip: Secondary | ICD-10-CM | POA: Diagnosis not present

## 2015-08-07 DIAGNOSIS — D225 Melanocytic nevi of trunk: Secondary | ICD-10-CM | POA: Diagnosis not present

## 2015-08-07 DIAGNOSIS — D2261 Melanocytic nevi of right upper limb, including shoulder: Secondary | ICD-10-CM | POA: Diagnosis not present

## 2015-08-07 DIAGNOSIS — D2262 Melanocytic nevi of left upper limb, including shoulder: Secondary | ICD-10-CM | POA: Diagnosis not present

## 2015-08-07 DIAGNOSIS — Z85820 Personal history of malignant melanoma of skin: Secondary | ICD-10-CM | POA: Diagnosis not present

## 2015-08-07 DIAGNOSIS — D485 Neoplasm of uncertain behavior of skin: Secondary | ICD-10-CM | POA: Diagnosis not present

## 2015-08-08 ENCOUNTER — Ambulatory Visit (INDEPENDENT_AMBULATORY_CARE_PROVIDER_SITE_OTHER): Payer: Medicare Other | Admitting: General Practice

## 2015-08-08 DIAGNOSIS — D6859 Other primary thrombophilia: Secondary | ICD-10-CM

## 2015-08-08 DIAGNOSIS — Z5181 Encounter for therapeutic drug level monitoring: Secondary | ICD-10-CM

## 2015-08-08 LAB — POCT INR: INR: 3.5

## 2015-08-08 NOTE — Progress Notes (Signed)
Pre visit review using our clinic review tool, if applicable. No additional management support is needed unless otherwise documented below in the visit note. 

## 2015-09-03 ENCOUNTER — Other Ambulatory Visit: Payer: Self-pay | Admitting: Family Medicine

## 2015-09-03 ENCOUNTER — Telehealth: Payer: Self-pay | Admitting: Family Medicine

## 2015-09-03 NOTE — Telephone Encounter (Signed)
Pt said [facility] sent a req to refill her prescriptions and she would like those rx to be 90 days    ranitidine (ZANTAC) 300 MG tablet Vitamin D, Ergocalciferol, (DRISDOL) 50000 UNITS CAPS capsule , triamterene-hydrochlorothiazide (MAXZIDE-25) 37.5-25 MG per tablet

## 2015-09-03 NOTE — Telephone Encounter (Signed)
Medications refilled

## 2015-09-19 ENCOUNTER — Ambulatory Visit (INDEPENDENT_AMBULATORY_CARE_PROVIDER_SITE_OTHER): Payer: Medicare Other | Admitting: General Practice

## 2015-09-19 DIAGNOSIS — D6859 Other primary thrombophilia: Secondary | ICD-10-CM

## 2015-09-19 DIAGNOSIS — Z5181 Encounter for therapeutic drug level monitoring: Secondary | ICD-10-CM | POA: Diagnosis not present

## 2015-09-19 LAB — POCT INR: INR: 2.7

## 2015-09-19 NOTE — Progress Notes (Signed)
Pre visit review using our clinic review tool, if applicable. No additional management support is needed unless otherwise documented below in the visit note. 

## 2015-09-30 ENCOUNTER — Other Ambulatory Visit: Payer: Self-pay | Admitting: Family Medicine

## 2015-10-08 ENCOUNTER — Other Ambulatory Visit (INDEPENDENT_AMBULATORY_CARE_PROVIDER_SITE_OTHER): Payer: Medicare Other

## 2015-10-08 DIAGNOSIS — I10 Essential (primary) hypertension: Secondary | ICD-10-CM | POA: Diagnosis not present

## 2015-10-08 DIAGNOSIS — E119 Type 2 diabetes mellitus without complications: Secondary | ICD-10-CM | POA: Diagnosis not present

## 2015-10-08 DIAGNOSIS — K7200 Acute and subacute hepatic failure without coma: Secondary | ICD-10-CM

## 2015-10-08 LAB — HEPATIC FUNCTION PANEL
ALBUMIN: 4.6 g/dL (ref 3.5–5.2)
ALK PHOS: 54 U/L (ref 39–117)
ALT: 19 U/L (ref 0–35)
AST: 15 U/L (ref 0–37)
BILIRUBIN DIRECT: 0.2 mg/dL (ref 0.0–0.3)
Total Bilirubin: 1 mg/dL (ref 0.2–1.2)
Total Protein: 7 g/dL (ref 6.0–8.3)

## 2015-10-08 LAB — BASIC METABOLIC PANEL
BUN: 13 mg/dL (ref 6–23)
CHLORIDE: 103 meq/L (ref 96–112)
CO2: 33 meq/L — AB (ref 19–32)
Calcium: 9.7 mg/dL (ref 8.4–10.5)
Creatinine, Ser: 0.94 mg/dL (ref 0.40–1.20)
GFR: 64.21 mL/min (ref >60.00)
GLUCOSE: 104 mg/dL — AB (ref 70–99)
Potassium: 5 mEq/L (ref 3.5–5.1)
Sodium: 145 mEq/L (ref 135–145)

## 2015-10-08 LAB — HEMOGLOBIN A1C: Hgb A1c MFr Bld: 5.7 % (ref 4.6–6.5)

## 2015-10-15 ENCOUNTER — Ambulatory Visit (INDEPENDENT_AMBULATORY_CARE_PROVIDER_SITE_OTHER): Payer: Medicare Other | Admitting: Family Medicine

## 2015-10-15 ENCOUNTER — Encounter: Payer: Self-pay | Admitting: Family Medicine

## 2015-10-15 VITALS — BP 120/82 | HR 75 | Temp 98.8°F | Wt 184.0 lb

## 2015-10-15 DIAGNOSIS — D6859 Other primary thrombophilia: Secondary | ICD-10-CM

## 2015-10-15 DIAGNOSIS — I10 Essential (primary) hypertension: Secondary | ICD-10-CM

## 2015-10-15 DIAGNOSIS — E119 Type 2 diabetes mellitus without complications: Secondary | ICD-10-CM | POA: Diagnosis not present

## 2015-10-15 DIAGNOSIS — I480 Paroxysmal atrial fibrillation: Secondary | ICD-10-CM

## 2015-10-15 DIAGNOSIS — E039 Hypothyroidism, unspecified: Secondary | ICD-10-CM

## 2015-10-15 NOTE — Progress Notes (Signed)
Garret Reddish, MD  Subjective:  Erin Kirk is a 60 y.o. year old very pleasant female patient who presents for/with See problem oriented charting ROS- no hypoglycemia. Some diarrhea. No chest pain, shortness of breath or leg swelling  Past Medical History-  Patient Active Problem List   Diagnosis Date Noted  . Disability examination 01/17/2015    Priority: High  . Type II diabetes mellitus, well controlled ([place]) 11/28/2010    Priority: High  . PAROXYSMAL ATRIAL FIBRILLATION 07/02/2010    Priority: High  . Primary hypercoagulable state ([place]) 11/30/2007    Priority: High  . CKD (chronic kidney disease), stage III 09/05/2014    Priority: Medium  . OSTEOPOROSIS NOS 05/25/2007    Priority: Medium  . Hypothyroidism 03/09/2007    Priority: Medium  . Essential hypertension 03/09/2007    Priority: Medium  . History of Melanoma 03/09/2007    Priority: Medium  . Encounter for therapeutic drug monitoring 10/26/2013    Priority: Low  . Left facial pain 09/24/2012    Priority: Low  . Oral mucosal lesion 09/24/2012    Priority: Low  . TRANSIENT DISORDER INITIATING/MAINTAINING SLEEP 04/02/2010    Priority: Low  . Overweight(278.02) 11/29/2009    Priority: Low  . LOW BACK PAIN 06/15/2008    Priority: Low  . ALLERGIC RHINITIS 03/09/2007    Priority: Low  . GERD 03/09/2007    Priority: Low    Medications- reviewed and updated Current Outpatient Prescriptions  Medication Sig Dispense Refill  . acetaminophen (TYLENOL) 325 MG tablet Take 650 mg by mouth every 6 (six) hours as needed for mild pain or fever.     . Ascorbic Acid (VITAMIN C) 500 MG tablet Take 500 mg by mouth daily.      . Calcium Citrate-Vitamin D 250-200 MG-UNIT TABS Take 2 tablets by mouth daily.     . Cyanocobalamin (VITAMIN B12 PO) Take 1 tablet by mouth daily.    Marland Kitchen diltiazem (CARDIZEM CD) 360 MG 24 hr capsule TAKE ONE CAPSULE BY MOUTH ONCE DAILY 90 capsule 3  . folic acid (FOLVITE) 1 MG tablet TAKE ONE TABLET BY  MOUTH ONCE DAILY 90 tablet 6  . glucose blood (BAYER CONTOUR TEST) test strip Test glucose once daily.  Dx E11.9 100 each 0  . levothyroxine (SYNTHROID, LEVOTHROID) 100 MCG tablet TAKE ONE TABLET BY MOUTH ONCE DAILY 90 tablet 2  . metFORMIN (GLUCOPHAGE) 1000 MG tablet TAKE ONE TABLET BY MOUTH TWICE DAILY WITH A MEAL 60 tablet 6  . metoprolol succinate (TOPROL-XL) 25 MG 24 hr tablet TAKE ONE TABLET BY MOUTH ONCE DAILY 90 tablet 3  . ranitidine (ZANTAC) 300 MG tablet TAKE ONE TABLET BY MOUTH AT BEDTIME 90 tablet 3  . triamterene-hydrochlorothiazide (MAXZIDE-25) 37.5-25 MG tablet TAKE ONE TABLET BY MOUTH DAILY 90 tablet 3  . valsartan (DIOVAN) 320 MG tablet TAKE ONE TABLET BY MOUTH ONCE DAILY 90 tablet 3  . Vitamin D, Ergocalciferol, (DRISDOL) 50000 UNITS CAPS capsule TAKE ONE CAPSULE BY MOUTH ONCE A WEEK. 52 capsule 3  . warfarin (COUMADIN) 5 MG tablet TAKE AS DIRECTED BY ANTICOAGULATION CLINIC 35 tablet 0   No current facility-administered medications for this visit.    Objective: BP 120/82 mmHg  Pulse 75  Temp(Src) 98.8 F (37.1 C)  Wt 184 lb (83.462 kg) Gen: NAD, resting comfortably CV: RRR no murmurs rubs or gallops Lungs: CTAB no crackles, wheeze, rhonchi Abdomen: soft/nontender/nondistended/normal bowel sounds. No rebound or guarding.  Ext: no edema Skin: warm, dry Neuro:  grossly normal, moves all extremities  Diabetic Foot Exam - Simple   Simple Foot Form  Diabetic Foot exam was performed with the following findings:  Yes 10/15/2015 10:13 AM  Visual Inspection  No deformities, no ulcerations, no other skin breakdown bilaterally:  Yes  Sensation Testing  Intact to touch and monofilament testing bilaterally:  Yes  Pulse Check  Posterior Tibialis and Dorsalis pulse intact bilaterally:  Yes  Comments       Assessment/Plan:  Type II diabetes mellitus, well controlled S: well controlled. On metformin 1000mg  BID. Previously on janumet but reduced with excellent control.   CBGs- 80s to 110 Lab Results  Component Value Date   HGBA1C 5.7 10/08/2015   HGBA1C 5.7 01/07/2015   HGBA1C 6.0 10/04/2014    A/P: reduce metformin to 500mg  BID and repeat a1c in 3 months. Hopeful to reduce GI side effects   PAROXYSMAL ATRIAL FIBRILLATION S: appears to be in sinus today- paroxysmal. Remains on coumadin . 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 Now on lifelong coumadin A/P: continue regular coumadin folllow up at our clinic- no signs or symptoms of DVT/PE   Essential hypertension S: controlled. On  Triamterene-hctz, valsartan, diltiazem, metoprolol BP Readings from Last 3 Encounters:  10/15/15 120/82  07/29/15 118/70  06/14/15 122/80  A/P:Continue current meds:  Potassium in range but trending up. Consider cutting down on valsartan to 160 if trend worsens.    Return in about 3 months (around 01/13/2016). with a1c and tsh before visit Return precautions advised.   Orders Placed This Encounter  Procedures  . Hemoglobin A1c    [place]    Standing Status: Future     Number of Occurrences:      Standing Expiration Date: 10/14/2016  . TSH    Standing Status: Future     Number of Occurrences:      Standing Expiration Date: 10/14/2016

## 2015-10-15 NOTE — Assessment & Plan Note (Signed)
S: well controlled. On metformin 1000mg  BID. Previously on janumet but reduced with excellent control.  CBGs- 80s to 110 Lab Results  Component Value Date   HGBA1C 5.7 10/08/2015   HGBA1C 5.7 01/07/2015   HGBA1C 6.0 10/04/2014    A/P: reduce metformin to 500mg  BID and repeat a1c in 3 months. Hopeful to reduce GI side effects

## 2015-10-15 NOTE — Assessment & Plan Note (Signed)
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 Now on lifelong coumadin A/P: continue regular coumadin folllow up at our clinic- no signs or symptoms of DVT/PE

## 2015-10-15 NOTE — Assessment & Plan Note (Signed)
S: appears to be in sinus today- paroxysmal. Remains on coumadin . Rate control with dilt and metoprolol A/P: continue current medications

## 2015-10-15 NOTE — Patient Instructions (Addendum)
Take about 3 days off metformin completely. Then start 1/2 a tab in the morning for 3 days. As long as no diarrhea with this, may go up to 1/2 tab twice a day at that point.   Since we are reducing medicine follow up with [place] in 3 months. Get youa a1c on 01/07/16 or later and we will check thyroid at that time as well  Great job with weight loss- keep up the good work [employer] from Last 3 Encounters:  10/15/15 184 lb (83.462 kg)  07/29/15 187 lb (84.823 kg)  06/14/15 190 lb (86.183 kg)   No other changes

## 2015-10-15 NOTE — Assessment & Plan Note (Signed)
S: controlled. On  Triamterene-hctz, valsartan, diltiazem, metoprolol BP Readings from Last 3 Encounters:  10/15/15 120/82  07/29/15 118/70  06/14/15 122/80  A/P:Continue current meds:  Potassium in range but trending up. Consider cutting down on valsartan to 160 if trend worsens.

## 2015-10-28 ENCOUNTER — Ambulatory Visit (INDEPENDENT_AMBULATORY_CARE_PROVIDER_SITE_OTHER): Payer: Medicare Other | Admitting: General Practice

## 2015-10-28 DIAGNOSIS — Z5181 Encounter for therapeutic drug level monitoring: Secondary | ICD-10-CM | POA: Diagnosis not present

## 2015-10-28 DIAGNOSIS — D6859 Other primary thrombophilia: Secondary | ICD-10-CM

## 2015-10-28 LAB — POCT INR: INR: 4.2

## 2015-10-28 NOTE — Progress Notes (Signed)
Pre visit review using our clinic review tool, if applicable. No additional management support is needed unless otherwise documented below in the visit note. INR is high today.  Pt reports decreased appetite.  Educated patient about the importance of a steady diet.  Also instructed patient to report any unusual bleeding or bruising.

## 2015-11-02 ENCOUNTER — Other Ambulatory Visit: Payer: Self-pay | Admitting: Family Medicine

## 2015-11-15 ENCOUNTER — Other Ambulatory Visit: Payer: Self-pay | Admitting: Family Medicine

## 2015-11-25 ENCOUNTER — Ambulatory Visit (INDEPENDENT_AMBULATORY_CARE_PROVIDER_SITE_OTHER): Payer: Medicare Other | Admitting: General Practice

## 2015-11-25 DIAGNOSIS — Z5181 Encounter for therapeutic drug level monitoring: Secondary | ICD-10-CM

## 2015-11-25 DIAGNOSIS — D6859 Other primary thrombophilia: Secondary | ICD-10-CM

## 2015-11-25 LAB — POCT INR: INR: 2.7

## 2015-11-25 NOTE — Progress Notes (Signed)
Pre visit review using our clinic review tool, if applicable. No additional management support is needed unless otherwise documented below in the visit note. 

## 2015-12-03 ENCOUNTER — Encounter: Payer: Self-pay | Admitting: Family Medicine

## 2015-12-03 DIAGNOSIS — Z7984 Long term (current) use of oral hypoglycemic drugs: Secondary | ICD-10-CM | POA: Diagnosis not present

## 2015-12-03 DIAGNOSIS — H2513 Age-related nuclear cataract, bilateral: Secondary | ICD-10-CM | POA: Diagnosis not present

## 2015-12-03 DIAGNOSIS — E119 Type 2 diabetes mellitus without complications: Secondary | ICD-10-CM | POA: Diagnosis not present

## 2015-12-03 LAB — HM DIABETES EYE EXAM

## 2016-01-06 ENCOUNTER — Ambulatory Visit (INDEPENDENT_AMBULATORY_CARE_PROVIDER_SITE_OTHER): Payer: Medicare Other | Admitting: General Practice

## 2016-01-06 DIAGNOSIS — D6859 Other primary thrombophilia: Secondary | ICD-10-CM | POA: Diagnosis not present

## 2016-01-06 DIAGNOSIS — Z5181 Encounter for therapeutic drug level monitoring: Secondary | ICD-10-CM

## 2016-01-06 LAB — POCT INR: INR: 3.4

## 2016-01-06 NOTE — Progress Notes (Signed)
Pre visit review using our clinic review tool, if applicable. No additional management support is needed unless otherwise documented below in the visit note. 

## 2016-01-08 ENCOUNTER — Other Ambulatory Visit (INDEPENDENT_AMBULATORY_CARE_PROVIDER_SITE_OTHER): Payer: Medicare Other

## 2016-01-08 DIAGNOSIS — E039 Hypothyroidism, unspecified: Secondary | ICD-10-CM | POA: Diagnosis not present

## 2016-01-08 DIAGNOSIS — E119 Type 2 diabetes mellitus without complications: Secondary | ICD-10-CM

## 2016-01-08 LAB — HEMOGLOBIN A1C: Hgb A1c MFr Bld: 5.7 % (ref 4.6–6.5)

## 2016-01-08 LAB — TSH: TSH: 1.21 u[IU]/mL (ref 0.35–4.50)

## 2016-01-13 ENCOUNTER — Ambulatory Visit (INDEPENDENT_AMBULATORY_CARE_PROVIDER_SITE_OTHER): Payer: Medicare Other | Admitting: Family Medicine

## 2016-01-13 ENCOUNTER — Encounter: Payer: Self-pay | Admitting: Family Medicine

## 2016-01-13 VITALS — BP 104/72 | HR 72 | Temp 97.5°F | Wt 182.0 lb

## 2016-01-13 DIAGNOSIS — E119 Type 2 diabetes mellitus without complications: Secondary | ICD-10-CM | POA: Diagnosis not present

## 2016-01-13 DIAGNOSIS — I480 Paroxysmal atrial fibrillation: Secondary | ICD-10-CM

## 2016-01-13 DIAGNOSIS — Z1211 Encounter for screening for malignant neoplasm of colon: Secondary | ICD-10-CM

## 2016-01-13 DIAGNOSIS — K219 Gastro-esophageal reflux disease without esophagitis: Secondary | ICD-10-CM

## 2016-01-13 DIAGNOSIS — E039 Hypothyroidism, unspecified: Secondary | ICD-10-CM | POA: Diagnosis not present

## 2016-01-13 MED ORDER — RANITIDINE HCL 150 MG PO TABS: 150.0000 mg | ORAL_TABLET | Freq: Two times a day (BID) | ORAL | Status: DC

## 2016-01-13 NOTE — Assessment & Plan Note (Signed)
S:Hyopothyroidism controlled on 139mcg levothyroxine Lab Results  Component Value Date   TSH 1.21 01/08/2016  A/P: continue current medication, doing well

## 2016-01-13 NOTE — Progress Notes (Signed)
Subjective:  Erin Kirk is a 60 y.o. year old very pleasant female patient who presents for/with See problem oriented charting ROS- No chest pain or shortness of breath. No headache or blurry vision. Does have occasoinal palpitations.   Past Medical History-  Patient Active Problem List   Diagnosis Date Noted  . Disability examination 01/17/2015    Priority: High  . Type II diabetes mellitus, well controlled ([place]) 11/28/2010    Priority: High  . PAROXYSMAL ATRIAL FIBRILLATION 07/02/2010    Priority: High  . Primary hypercoagulable state ([place]) 11/30/2007    Priority: High  . CKD (chronic kidney disease), stage III 09/05/2014    Priority: Medium  . OSTEOPOROSIS NOS 05/25/2007    Priority: Medium  . Hypothyroidism 03/09/2007    Priority: Medium  . Essential hypertension 03/09/2007    Priority: Medium  . History of Melanoma 03/09/2007    Priority: Medium  . Encounter for therapeutic drug monitoring 10/26/2013    Priority: Low  . Left facial pain 09/24/2012    Priority: Low  . Oral mucosal lesion 09/24/2012    Priority: Low  . TRANSIENT DISORDER INITIATING/MAINTAINING SLEEP 04/02/2010    Priority: Low  . Overweight(278.02) 11/29/2009    Priority: Low  . LOW BACK PAIN 06/15/2008    Priority: Low  . ALLERGIC RHINITIS 03/09/2007    Priority: Low  . GERD 03/09/2007    Priority: Low    Medications- reviewed and updated Current Outpatient Prescriptions  Medication Sig Dispense Refill  . diltiazem (CARDIZEM CD) 360 MG 24 hr capsule TAKE ONE CAPSULE BY MOUTH ONCE DAILY 90 capsule 3  . folic acid (FOLVITE) 1 MG tablet TAKE ONE TABLET BY MOUTH ONCE DAILY 90 tablet 6  . glucose blood (BAYER CONTOUR TEST) test strip Test glucose once daily.  Dx E11.9 100 each 0  . levothyroxine (SYNTHROID, LEVOTHROID) 100 MCG tablet TAKE ONE TABLET BY MOUTH ONCE DAILY 90 tablet 2  . metFORMIN (GLUCOPHAGE) 1000 MG tablet TAKE ONE TABLET BY MOUTH TWICE DAILY WITH A MEAL 60 tablet 5  . metoprolol  succinate (TOPROL-XL) 25 MG 24 hr tablet TAKE ONE TABLET BY MOUTH ONCE DAILY 90 tablet 3  . ranitidine (ZANTAC) 150 MG tablet Take 1 tablet (150 mg total) by mouth 2 (two) times daily. 180 tablet 3  . triamterene-hydrochlorothiazide (MAXZIDE-25) 37.5-25 MG tablet TAKE ONE TABLET BY MOUTH DAILY 90 tablet 3  . valsartan (DIOVAN) 320 MG tablet TAKE ONE TABLET BY MOUTH ONCE DAILY 90 tablet 3  . Vitamin D, Ergocalciferol, (DRISDOL) 50000 UNITS CAPS capsule TAKE ONE CAPSULE BY MOUTH ONCE A WEEK. 52 capsule 3  . warfarin (COUMADIN) 5 MG tablet TAKE AS DIRECTED BY MOUTH BY ANTICOAGULATION CLINIC 40 tablet 3  . acetaminophen (TYLENOL) 325 MG tablet Take 650 mg by mouth every 6 (six) hours as needed for mild pain or fever. Reported on 01/13/2016     No current facility-administered medications for this visit.    Objective: BP 104/72 mmHg  Pulse 72  Temp(Src) 97.5 F (36.4 C)  Wt 182 lb (82.555 kg) Gen: NAD, resting comfortably Mucous membranes are moist. CV: RRR no murmurs rubs or gallops Lungs: CTAB no crackles, wheeze, rhonchi Abdomen: soft/nontender/nondistended/normal bowel sounds. No rebound or guarding.  Ext: no edema Skin: warm, dry Neuro: grossly normal, moves all extremities  Assessment/Plan:  Type II diabetes mellitus, well controlled S: well controlled. On metformin 1000mg  BID CBGs- 80s to 110s still Exercise and diet- slow gradual weight loss  Lab Results  Component Value Date   HGBA1C 5.7 01/08/2016   HGBA1C 5.7 10/08/2015   HGBA1C 5.7 01/07/2015   A/P: no change in medication  PAROXYSMAL ATRIAL FIBRILLATION S: paroxysmal a fib. In sinus today. Compliant with coumadin, diltiazem, metoprolol. Occasional palpitations not increasing in frequency A/P: maintina rate control and anticoagulation but does appear to be in sinus today  Hypothyroidism S:Hyopothyroidism controlled on 163mcg levothyroxine Lab Results  Component Value Date   TSH 1.21 01/08/2016  A/P: continue  current medication, doing well  GERD S: occasional breakthrough symptoms on zantac 300mg  before bed A/P: change dosing to 150mg  BID, discussed PPI option which she declines due to PPI concerns   Return in about 4 months (around 05/14/2016). Return precautions advised.   Patient's last colonoscopy was in 2006 with Dr. Sharlett Iles. She would like to have a sit down with GI before colonoscopy which was requested. We discussed option of stool cards yearly and refer if positive but as stated- she wants to discuss with GI. Husband sees Dr. Carlean Purl and has had great experience and she would like to see him as well if possible. She did come off coumadin for last colonoscopy and no clots at that time but hypercoagulable state is my main concern.  Orders Placed This Encounter  Procedures  . Ambulatory referral to Gastroenterology    Referral Priority:  Routine    Referral Type:  Consultation    Referral Reason:  Specialty Services Required    Referred to Provider:  Gatha Mayer, MD    Number of Visits Requested:  1   Meds ordered this encounter  Medications  . ranitidine (ZANTAC) 150 MG tablet    Sig: Take 1 tablet (150 mg total) by mouth 2 (two) times daily.    Dispense:  180 tablet    Refill:  3   Garret Reddish, MD

## 2016-01-13 NOTE — Assessment & Plan Note (Signed)
S: well controlled. On metformin 1000mg  BID CBGs- 80s to 110s still Exercise and diet- slow gradual weight loss  Lab Results  Component Value Date   HGBA1C 5.7 01/08/2016   HGBA1C 5.7 10/08/2015   HGBA1C 5.7 01/07/2015   A/P: no change in medication

## 2016-01-13 NOTE — Patient Instructions (Signed)
We will call you within a week about your referral to gastroenterology. If you do not hear within 2 weeks, give [place] a call.   Change zantac from 300mg  once a day to 150mg  before breakfast and dinner.   No other changes

## 2016-01-13 NOTE — Assessment & Plan Note (Signed)
S: paroxysmal a fib. In sinus today. Compliant with coumadin, diltiazem, metoprolol. Occasional palpitations not increasing in frequency A/P: maintina rate control and anticoagulation but does appear to be in sinus today

## 2016-01-13 NOTE — Assessment & Plan Note (Signed)
S: occasional breakthrough symptoms on zantac 300mg  before bed A/P: change dosing to 150mg  BID, discussed PPI option which she declines due to PPI concerns

## 2016-02-03 ENCOUNTER — Ambulatory Visit (INDEPENDENT_AMBULATORY_CARE_PROVIDER_SITE_OTHER): Payer: Medicare Other | Admitting: General Practice

## 2016-02-03 DIAGNOSIS — Z5181 Encounter for therapeutic drug level monitoring: Secondary | ICD-10-CM | POA: Diagnosis not present

## 2016-02-03 DIAGNOSIS — D6859 Other primary thrombophilia: Secondary | ICD-10-CM | POA: Diagnosis not present

## 2016-02-03 LAB — POCT INR: INR: 3.4

## 2016-02-03 NOTE — Progress Notes (Signed)
Pre visit review using our clinic review tool, if applicable. No additional management support is needed unless otherwise documented below in the visit note. 

## 2016-02-10 DIAGNOSIS — Z124 Encounter for screening for malignant neoplasm of cervix: Secondary | ICD-10-CM | POA: Diagnosis not present

## 2016-02-10 DIAGNOSIS — Z1231 Encounter for screening mammogram for malignant neoplasm of breast: Secondary | ICD-10-CM | POA: Diagnosis not present

## 2016-03-02 ENCOUNTER — Ambulatory Visit (INDEPENDENT_AMBULATORY_CARE_PROVIDER_SITE_OTHER): Payer: Medicare Other | Admitting: General Practice

## 2016-03-02 DIAGNOSIS — Z5181 Encounter for therapeutic drug level monitoring: Secondary | ICD-10-CM

## 2016-03-02 DIAGNOSIS — D6859 Other primary thrombophilia: Secondary | ICD-10-CM

## 2016-03-02 LAB — POCT INR: INR: 3.5

## 2016-03-02 NOTE — Progress Notes (Signed)
Pre visit review using our clinic review tool, if applicable. No additional management support is needed unless otherwise documented below in the visit note. 

## 2016-03-13 ENCOUNTER — Other Ambulatory Visit: Payer: Self-pay | Admitting: Family Medicine

## 2016-03-23 ENCOUNTER — Telehealth: Payer: Self-pay

## 2016-03-23 ENCOUNTER — Other Ambulatory Visit: Payer: Self-pay | Admitting: Family Medicine

## 2016-03-23 ENCOUNTER — Ambulatory Visit (INDEPENDENT_AMBULATORY_CARE_PROVIDER_SITE_OTHER): Payer: Medicare Other | Admitting: Internal Medicine

## 2016-03-23 ENCOUNTER — Encounter: Payer: Self-pay | Admitting: Internal Medicine

## 2016-03-23 VITALS — BP 100/68 | HR 68 | Ht 64.5 in | Wt 176.8 lb

## 2016-03-23 DIAGNOSIS — I480 Paroxysmal atrial fibrillation: Secondary | ICD-10-CM | POA: Diagnosis not present

## 2016-03-23 DIAGNOSIS — Z7901 Long term (current) use of anticoagulants: Secondary | ICD-10-CM

## 2016-03-23 DIAGNOSIS — Z1211 Encounter for screening for malignant neoplasm of colon: Secondary | ICD-10-CM | POA: Diagnosis not present

## 2016-03-23 DIAGNOSIS — D6859 Other primary thrombophilia: Secondary | ICD-10-CM

## 2016-03-23 DIAGNOSIS — K521 Toxic gastroenteritis and colitis: Secondary | ICD-10-CM

## 2016-03-23 MED ORDER — ENOXAPARIN SODIUM 80 MG/0.8ML ~~LOC~~ SOLN: 1.0000 mg/kg | Freq: Two times a day (BID) | SUBCUTANEOUS | Status: DC

## 2016-03-23 MED ORDER — METOCLOPRAMIDE HCL 10 MG PO TABS: ORAL_TABLET | ORAL | Status: DC

## 2016-03-23 NOTE — Telephone Encounter (Signed)
Patient should stop coumadin 5 days prior to procedure Start lovenox 3 days prior to procedure  Continue lovenox up to 10 days after surgery See Jenny Reichmann for dosage instructions on coumadin post surgery  Roselyn Reef- can you make sure patient picks up materials before visit and make sure she has everything she needs? i placed the lovenox order  Jenny Reichmann- can you teach patient to do injections when she comes in or is there another way we can get her set up for this?

## 2016-03-23 NOTE — Patient Instructions (Addendum)
   You have been scheduled for a colonoscopy. Please follow written instructions given to you at your visit today.  Please pick up your prep supplies at the pharmacy If you use inhalers (even only as needed), please bring them with you on the day of your procedure. Your physician has requested that you go to www.startemmi.com and enter the access code given to you at your visit today. This web site gives a general overview about your procedure. However, you should still follow specific instructions given to you by our office regarding your preparation for the procedure.   You will be contaced by our office prior to your procedure for directions on holding your Coumadin/Warfarin.  If you do not hear from our office 1 week prior to your scheduled procedure, please call [phone] to discuss.   We have sent the following medications to your pharmacy for you to pick up at your convenience: [place]  I appreciate the opportunity to care for you. Silvano Rusk, MD, [facility]

## 2016-03-23 NOTE — Progress Notes (Signed)
  Referred by: Marin Olp, MD [place], [place] [address]  Subjective:    Patient ID: Erin Kirk, female    DOB: 30-Mar-1956, 60 y.o.   MRN: [id number] Cc: colon cancer screening HPI Has diarrhea intermittently w/ metformin - proven by holding metformin. Chronic issues. External hemorrhoid - may bleed with bad diarrhea. Negative screening colonoscopy 2006 -  GI ROS o/w negative Medications, allergies, past medical history, past surgical history, family history and social history are reviewed and updated in the EMR.  Review of Systems Sister's husband died recently so stressful all other ROS neg    Objective:   Physical Exam  @BP  100/68 mmHg  Pulse 68  Ht 5' 4.5" (1.638 m)  Wt 176 lb 12.8 oz (80.196 kg)  BMI 29.89 kg/m2@  General:  Well-developed, well-nourished and in no acute distress Eyes:  anicteric. Lungs: Clear to auscultation bilaterally. Heart:  S1S2, no rubs, murmurs, gallops. Abdomen:  soft, non-tender, no hepatosplenomegaly, hernia, or mass and BS+.  Rectal: deferred Extremities:   no edema, cyanosis or clubbing Neuro:  A&O x 3.  Psych:  appropriate mood and  Affect.  Data Reviewed: As per HPI     Assessment & Plan:   Encounter Diagnoses  Name Primary?  . Colon cancer screening Yes  . Chronic anticoagulation   . Hypercoagulable state ([place])   . Paroxysmal atrial fibrillation ([facility])   . Diarrhea due to drug - metformin    Screening colonoscopy will be scheduled - we discussed alternatives of cologuard and hemoccults. She requests coonoscopy  Will hold warfarin 5 days prior to endoscopic procedures - will instruct when and how to resume after procedure. Benefits and risks of procedure explained including risks of bleeding, perforation, infection, missed lesions, reactions to medications and possible need for hospitalization and surgery for complications. Additional rare but real risk of stroke or other vascular clotting events off   also explained and need to seek urgent help if any signs of these problems occur. Will communicate by phone or EMR with patient's  prescribing provider to confirm that holding warfarin is reasonable in this case.   Agree w/ considreing extended release metfornmin - she is losing weight so may end up diet-controlled/  enoxaparen bridge necessary when warfarin held due to anti-phospholipid syndrome/hypercoag state  I appreciate the opportunity to care for this patient.  [id number] Yong Channel, MD

## 2016-03-23 NOTE — Telephone Encounter (Signed)
[facility] [address] [place] [address]    RE: Erin Kirk DOB: August 21, 1956 MRN: [id number]   Dear Garret Reddish, MD,    We have scheduled the above patient for an endoscopic procedure. Our records show that she is on anticoagulation therapy.   Please advise as to how long the patient may come off her therapy of coumadin prior to the colonoscopy procedure, which is scheduled for 04/06/16.  Dr Carlean Purl said she will need Lovenox bridging, please set this up.  Thank you.  Please fax back/ or route the completed form to Erin Kirk, [place] at [phone].   Sincerely,    Silvano Rusk, MD

## 2016-03-25 NOTE — Telephone Encounter (Signed)
Villa Herb, RN will set up teaching at visit. Grateful for her help

## 2016-03-25 NOTE — Telephone Encounter (Signed)
Patient informed of the time to hold the coumadin and about using the Lovenox.  She will get trained on this next week 03/30/16 at her visit with Villa Herb, RN.  She verbalized understanding.

## 2016-03-30 ENCOUNTER — Ambulatory Visit (INDEPENDENT_AMBULATORY_CARE_PROVIDER_SITE_OTHER): Payer: Medicare Other | Admitting: General Practice

## 2016-03-30 DIAGNOSIS — D6859 Other primary thrombophilia: Secondary | ICD-10-CM | POA: Diagnosis not present

## 2016-03-30 DIAGNOSIS — Z5181 Encounter for therapeutic drug level monitoring: Secondary | ICD-10-CM

## 2016-03-30 LAB — POCT INR: INR: 4

## 2016-03-30 NOTE — Patient Instructions (Signed)
7/12 - Take last dose of coumadin until after procedure 7/13 - Do not take coumadin or Lovenox 7/14 - Take Lovenox in the am and pm (12 hours apart) 7/15 - Take Lovenox in the am and pm 7/16 - Take Lovenox ONLY IN THE AM 7/17 - PROCEDURE (DO NOT TAKE LOVENOX) 7/18 - Lovenox in the am and pm AND take 2 tablets of coumadin 7/19 - Lovenox in the am and pm and take 2 tablets of coumadin 7/20 - Lovenox in the am and pm and take 1 1/2 tablets of coumadin 7/21 - Lovenox in the am and pm and take 1 1/2 tablets of coumadin 7/22 - Stop Lovenox and continue coumadin as dosed.  Re-check INR on 7/24.

## 2016-03-30 NOTE — Progress Notes (Signed)
Pre visit review using our clinic review tool, if applicable. No additional management support is needed unless otherwise documented below in the visit note. 

## 2016-04-06 ENCOUNTER — Ambulatory Visit (AMBULATORY_SURGERY_CENTER): Payer: Medicare Other | Admitting: Internal Medicine

## 2016-04-06 ENCOUNTER — Encounter: Payer: Self-pay | Admitting: Internal Medicine

## 2016-04-06 VITALS — BP 106/73 | HR 67 | Temp 98.4°F | Resp 20 | Ht 64.5 in | Wt 176.0 lb

## 2016-04-06 DIAGNOSIS — D122 Benign neoplasm of ascending colon: Secondary | ICD-10-CM | POA: Diagnosis not present

## 2016-04-06 DIAGNOSIS — I10 Essential (primary) hypertension: Secondary | ICD-10-CM | POA: Diagnosis not present

## 2016-04-06 DIAGNOSIS — Z1211 Encounter for screening for malignant neoplasm of colon: Secondary | ICD-10-CM

## 2016-04-06 DIAGNOSIS — E119 Type 2 diabetes mellitus without complications: Secondary | ICD-10-CM | POA: Diagnosis not present

## 2016-04-06 DIAGNOSIS — I4891 Unspecified atrial fibrillation: Secondary | ICD-10-CM | POA: Diagnosis not present

## 2016-04-06 DIAGNOSIS — K219 Gastro-esophageal reflux disease without esophagitis: Secondary | ICD-10-CM | POA: Diagnosis not present

## 2016-04-06 DIAGNOSIS — E669 Obesity, unspecified: Secondary | ICD-10-CM | POA: Diagnosis not present

## 2016-04-06 LAB — GLUCOSE, CAPILLARY
GLUCOSE-CAPILLARY: 93 mg/dL (ref 65–99)
Glucose-Capillary: 96 mg/dL (ref 65–99)

## 2016-04-06 MED ORDER — SODIUM CHLORIDE 0.9 % IV SOLN: 500.0000 mL | INTRAVENOUS | Status: DC

## 2016-04-06 NOTE — Patient Instructions (Addendum)
I found and removed one polyp. I will let you know pathology results and when to have another routine colonoscopy by mail.  Warfarin and Lovenox instructions as you know:    7/17 - PROCEDURE (DO NOT TAKE LOVENOX) 7/18 - Lovenox in the am and pm AND take 2 tablets of coumadin 7/19 - Lovenox in the am and pm and take 2 tablets of coumadin 7/20 - Lovenox in the am and pm and take 1 1/2 tablets of coumadin 7/21 - Lovenox in the am and pm and take 1 1/2 tablets of coumadin 7/22 - Stop Lovenox and continue coumadin as dosed.  Re-check INR on 7/24.    I appreciate the opportunity to care for you. Gatha Mayer, MD, [facility]   Handout given on polyps.   YOU HAD AN ENDOSCOPIC PROCEDURE TODAY AT [facility]:   Refer to the procedure report that was given to you for any specific questions about what was found during the examination.  If the procedure report does not answer your questions, please call your gastroenterologist to clarify.  If you requested that your care partner not be given the details of your procedure findings, then the procedure report has been included in a sealed envelope for you to review at your convenience later.  YOU SHOULD EXPECT: Some feelings of bloating in the abdomen. Passage of more gas than usual.  Walking can help get rid of the air that was put into your GI tract during the procedure and reduce the bloating. If you had a lower endoscopy (such as a colonoscopy or flexible sigmoidoscopy) you may notice spotting of blood in your stool or on the toilet paper. If you underwent a bowel prep for your procedure, you may not have a normal bowel movement for a few days.  Please Note:  You might notice some irritation and congestion in your nose or some drainage.  This is from the oxygen used during your procedure.  There is no need for concern and it should clear up in a day or so.  SYMPTOMS TO REPORT IMMEDIATELY:   Following lower endoscopy  (colonoscopy or flexible sigmoidoscopy):  Excessive amounts of blood in the stool  Significant tenderness or worsening of abdominal pains  Swelling of the abdomen that is new, acute  Fever of 100F or higher   For urgent or emergent issues, a gastroenterologist can be reached at any hour by calling [phone].   DIET: Your first meal following the procedure should be a small meal and then it is ok to progress to your normal diet. Heavy or fried foods are harder to digest and may make you feel nauseous or bloated.  Likewise, meals heavy in dairy and vegetables can increase bloating.  Drink plenty of fluids but you should avoid alcoholic beverages for 24 hours.  ACTIVITY:  You should plan to take it easy for the rest of today and you should NOT DRIVE or use heavy machinery until tomorrow (because of the sedation medicines used during the test).    FOLLOW UP: Our staff will call the number listed on your records the next business day following your procedure to check on you and address any questions or concerns that you may have regarding the information given to you following your procedure. If we do not reach you, we will leave a message.  However, if you are feeling well and you are not experiencing any problems, there is no need to return our call.  We will assume that you have returned to your regular daily activities without incident.  If any biopsies were taken you will be contacted by phone or by letter within the next 1-3 weeks.  Please call us at [phone] if you have not heard about the biopsies in 3 weeks.    SIGNATURES/CONFIDENTIALITY: You and/or your care partner have signed paperwork which will be entered into your electronic medical record.  These signatures attest to the fact that that the information above on your After Visit Summary has been reviewed and is understood.  Full responsibility of the confidentiality of this discharge information lies with you and/or your  care-partner.

## 2016-04-06 NOTE — Progress Notes (Signed)
Report to PACU, RN, vss, BBS= Clear.  

## 2016-04-06 NOTE — Progress Notes (Signed)
Called to room to assist during endoscopic procedure.  Patient ID and intended procedure confirmed with present staff. Received instructions for my participation in the procedure from the performing physician.  

## 2016-04-06 NOTE — Op Note (Signed)
[name] Patient Name: Erin Kirk Procedure Date: 04/06/2016 8:50 AM MRN: [id number] Endoscopist: Gatha Mayer , MD Age: 60 Referring MD:  Date of Birth: [birth year] Gender: Female Account #: [id number] Procedure:                Colonoscopy Indications:              Screening for colorectal malignant neoplasm (last                            colonoscopy was more than 10 years ago) Medicines:                Propofol per Anesthesia, Monitored Anesthesia Care Procedure:                Pre-Anesthesia Assessment:                           - Prior to the procedure, a History and Physical                            was performed, and patient medications and                            allergies were reviewed. The patient's tolerance of                            previous anesthesia was also reviewed. The risks                            and benefits of the procedure and the sedation                            options and risks were discussed with the patient.                            All questions were answered, and informed consent                            was obtained. Prior Anticoagulants: The patient                            last took Coumadin (warfarin) 5 days and Lovenox                            (enoxaparin) 1 day prior to the procedure. ASA                            Grade Assessment: III - A patient with severe                            systemic disease. After reviewing the risks and                            benefits, the patient was deemed in satisfactory  condition to undergo the procedure.                           After obtaining informed consent, the colonoscope                            was passed under direct vision. Throughout the                            procedure, the patient's blood pressure, pulse, and                            oxygen saturations were monitored continuously. The                            Model  [id number]-[id number]) scope was introduced                            through the anus and advanced to the the cecum,                            identified by appendiceal orifice and ileocecal                            valve. The colonoscopy was somewhat difficult due                            to restricted mobility of the colon. Successful                            completion of the procedure was aided by                            straightening and shortening the scope to obtain                            bowel loop reduction. The patient tolerated the                            procedure well. The quality of the bowel                            preparation was adequate. The bowel preparation                            used was Miralax. The ileocecal valve, appendiceal                            orifice, and rectum were photographed. Scope In: 8:55:51 AM Scope Out: 9:15:26 AM Total Procedure Duration: 0 hours 19 minutes 35 seconds  Findings:                 The perianal and digital rectal examinations were  normal.                           A 9 mm polyp was found in the ascending colon. The                            polyp was sessile. The polyp was removed with a                            cold snare. Resection and retrieval were complete.                            Verification of patient identification for the                            specimen was done. Estimated blood loss was minimal.                           The sigmoid colon was moderately tortuous.                           The exam was otherwise without abnormality on                            direct and retroflexion views. Complications:            No immediate complications. Estimated Blood Loss:     Estimated blood loss was minimal. Impression:               - One 9 mm polyp in the ascending colon, removed                            with a cold snare. Resected and retrieved.                            - Tortuous colon.                           - The examination was otherwise normal on direct                            and retroflexion views. Recommendation:           - Patient has a contact number available for                            emergencies. The signs and symptoms of potential                            delayed complications were discussed with the                            patient. Return to normal activities tomorrow.                            Written discharge  instructions were provided to the                            patient.                           - Resume previous diet.                           - Continue present medications.                           - Resume Coumadin (warfarin) tomorrow and Lovenox                            (enoxaparin) tomorrow at prior doses. Refer to                            Coumadin Clinic for further adjustment of therapy.                           - Repeat colonoscopy is recommended. WOULD USE                            PEDIATRIC SCOPE DUE TO ANGULATED SIGMOID The                            colonoscopy date will be determined after pathology                            results from today's exam become available for                            review. Gatha Mayer, MD 04/06/2016 9:23:36 AM This report has been signed electronically. CC Letter to:             Brayton Mars. Yong Channel MD

## 2016-04-07 ENCOUNTER — Telehealth: Payer: Self-pay

## 2016-04-07 NOTE — Telephone Encounter (Signed)
  Follow up Call-  Call back number 04/06/2016  Post procedure Call Back phone  # [phone] hm  Permission to leave phone message Yes     Patient questions:  Do you have a fever, pain , or abdominal swelling? No. Pain Score  0 *  Have you tolerated food without any problems? Yes.    Have you been able to return to your normal activities? Yes.    Do you have any questions about your discharge instructions: Diet   No. Medications  No. Follow up visit  No.  Do you have questions or concerns about your Care? No.  Actions: * If pain score is 4 or above: No action needed, pain <4.

## 2016-04-10 ENCOUNTER — Encounter: Payer: Self-pay | Admitting: Internal Medicine

## 2016-04-10 DIAGNOSIS — Z860101 Personal history of adenomatous and serrated colon polyps: Secondary | ICD-10-CM | POA: Insufficient documentation

## 2016-04-10 DIAGNOSIS — Z86010 Personal history of colonic polyps: Secondary | ICD-10-CM

## 2016-04-10 NOTE — Progress Notes (Signed)
Quick Note:  9 mm adenoma recall 2022 ______

## 2016-04-13 ENCOUNTER — Ambulatory Visit (INDEPENDENT_AMBULATORY_CARE_PROVIDER_SITE_OTHER): Payer: Medicare Other | Admitting: General Practice

## 2016-04-13 DIAGNOSIS — D6859 Other primary thrombophilia: Secondary | ICD-10-CM

## 2016-04-13 DIAGNOSIS — Z5181 Encounter for therapeutic drug level monitoring: Secondary | ICD-10-CM

## 2016-04-13 LAB — POCT INR: INR: 2.4

## 2016-05-08 ENCOUNTER — Other Ambulatory Visit: Payer: Self-pay | Admitting: Family Medicine

## 2016-05-11 ENCOUNTER — Ambulatory Visit (INDEPENDENT_AMBULATORY_CARE_PROVIDER_SITE_OTHER): Payer: Medicare Other | Admitting: General Practice

## 2016-05-11 DIAGNOSIS — D6859 Other primary thrombophilia: Secondary | ICD-10-CM | POA: Diagnosis not present

## 2016-05-11 DIAGNOSIS — Z5181 Encounter for therapeutic drug level monitoring: Secondary | ICD-10-CM

## 2016-05-11 LAB — POCT INR: INR: 2.8

## 2016-05-14 ENCOUNTER — Encounter: Payer: Self-pay | Admitting: Family Medicine

## 2016-05-14 ENCOUNTER — Ambulatory Visit (INDEPENDENT_AMBULATORY_CARE_PROVIDER_SITE_OTHER): Payer: Medicare Other | Admitting: Family Medicine

## 2016-05-14 VITALS — BP 102/72 | HR 68 | Temp 97.6°F | Wt 174.4 lb

## 2016-05-14 DIAGNOSIS — I10 Essential (primary) hypertension: Secondary | ICD-10-CM

## 2016-05-14 DIAGNOSIS — D6859 Other primary thrombophilia: Secondary | ICD-10-CM | POA: Diagnosis not present

## 2016-05-14 DIAGNOSIS — E119 Type 2 diabetes mellitus without complications: Secondary | ICD-10-CM | POA: Diagnosis not present

## 2016-05-14 DIAGNOSIS — K219 Gastro-esophageal reflux disease without esophagitis: Secondary | ICD-10-CM

## 2016-05-14 DIAGNOSIS — E039 Hypothyroidism, unspecified: Secondary | ICD-10-CM

## 2016-05-14 DIAGNOSIS — Z23 Encounter for immunization: Secondary | ICD-10-CM | POA: Diagnosis not present

## 2016-05-14 DIAGNOSIS — I480 Paroxysmal atrial fibrillation: Secondary | ICD-10-CM

## 2016-05-14 LAB — POCT GLYCOSYLATED HEMOGLOBIN (HGB A1C): HEMOGLOBIN A1C: 5.2

## 2016-05-14 MED ORDER — VALSARTAN 160 MG PO TABS: 160.0000 mg | ORAL_TABLET | Freq: Every day | ORAL | 11 refills | Status: DC

## 2016-05-14 NOTE — Assessment & Plan Note (Signed)
S: paroxysmal. Appears in sinus.occasional palpitations Compliant with coumadin, diltiazem, metoprolol.  A/P: rate control when in a fib, sinus otherwise, continue on anticoagulation.

## 2016-05-14 NOTE — Assessment & Plan Note (Signed)
S: patient tried zantac 150mg  BID and no difference A/P: will return to 300mg  once a day. Does not want to trial PPI- worried about SE profile.

## 2016-05-14 NOTE — Progress Notes (Signed)
Subjective:  Erin Kirk is a 60 y.o. year old very pleasant female patient who presents for/with See problem oriented charting ROS- occasional palpitations. No chest pain or shortness of breath. No headache or blurry vision.see any ROS included in HPI as well.   Past Medical History-  Patient Active Problem List   Diagnosis Date Noted  . Disability examination 01/17/2015    Priority: High  . Type II diabetes mellitus, well controlled ([place]) 11/28/2010    Priority: High  . PAROXYSMAL ATRIAL FIBRILLATION 07/02/2010    Priority: High  . Primary hypercoagulable state ([place]) 11/30/2007    Priority: High  . Hx of adenomatous polyp of colon 04/10/2016    Priority: Medium  . CKD (chronic kidney disease), stage III 09/05/2014    Priority: Medium  . Hypothyroidism 03/09/2007    Priority: Medium  . Essential hypertension 03/09/2007    Priority: Medium  . History of Melanoma 03/09/2007    Priority: Medium  . Encounter for therapeutic drug monitoring 10/26/2013    Priority: Low  . Left facial pain 09/24/2012    Priority: Low  . Oral mucosal lesion 09/24/2012    Priority: Low  . TRANSIENT DISORDER INITIATING/MAINTAINING SLEEP 04/02/2010    Priority: Low  . Overweight(278.02) 11/29/2009    Priority: Low  . LOW BACK PAIN 06/15/2008    Priority: Low  . ALLERGIC RHINITIS 03/09/2007    Priority: Low  . GERD 03/09/2007    Priority: Low    Medications- reviewed and updated Current Outpatient Prescriptions  Medication Sig Dispense Refill  . acetaminophen (TYLENOL) 325 MG tablet Take 650 mg by mouth every 6 (six) hours as needed for mild pain or fever. Reported on 01/13/2016    . diltiazem (CARDIZEM CD) 360 MG 24 hr capsule TAKE ONE CAPSULE BY MOUTH ONCE DAILY 90 capsule 3  . folic acid (FOLVITE) 1 MG tablet TAKE ONE TABLET BY MOUTH ONCE DAILY 90 tablet 6  . glucose blood (BAYER CONTOUR TEST) test strip Test glucose once daily.  Dx E11.9 100 each 0  . levothyroxine (SYNTHROID, LEVOTHROID)  100 MCG tablet TAKE ONE TABLET BY MOUTH ONCE DAILY 90 tablet 2  . metFORMIN (GLUCOPHAGE) 1000 MG tablet TAKE ONE TABLET BY MOUTH TWICE DAILY WITH A MEAL 60 tablet 5  . metoCLOPramide (REGLAN) 10 MG tablet Take one tablet 30 minutes prior to your prep times. 2 tablet 0  . metoprolol succinate (TOPROL-XL) 25 MG 24 hr tablet TAKE ONE TABLET BY MOUTH ONCE DAILY 90 tablet 0  . ranitidine (ZANTAC) 150 MG tablet Take 1 tablet (150 mg total) by mouth 2 (two) times daily. 180 tablet 3  . triamterene-hydrochlorothiazide (MAXZIDE-25) 37.5-25 MG tablet TAKE ONE TABLET BY MOUTH DAILY 90 tablet 3  . valsartan (DIOVAN) 320 MG tablet TAKE ONE TABLET BY MOUTH ONCE DAILY 90 tablet 3  . Vitamin D, Ergocalciferol, (DRISDOL) 50000 UNITS CAPS capsule TAKE ONE CAPSULE BY MOUTH ONCE A WEEK. 52 capsule 3  . warfarin (COUMADIN) 5 MG tablet TAKE BY MOUTH AS DIRECTED BY ANTICOAGULATION CLINIC 40 tablet 5   No current facility-administered medications for this visit.     Objective: BP 102/72 (BP Location: Left Arm, Patient Position: Sitting, Cuff Size: Normal)   Pulse 68   Temp 97.6 F (36.4 C) (Oral)   Wt 174 lb 6.4 oz (79.1 kg)   SpO2 97%   BMI 29.47 kg/m  Gen: NAD, resting comfortably Some swelling near upper right teeth CV: RRR no murmurs rubs or gallops Lungs: CTAB  no crackles, wheeze, rhonchi Abdomen: soft/nontender/nondistended/normal bowel sounds. No rebound or guarding.  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  A/P: Upcoming potential dental work. May need another lovenox bridge. Did well with this for colonoscopy- Jenny Reichmann can help [place] set up again  Hypothyroidism S: well controlled on 126mcg levothyroxine Lab Results  Component Value Date   TSH 1.21 01/08/2016  ROS-No hair or nail changes. No heat/cold intolerance. No  constipation or diarrhea. Denies shakiness or anxiety.  A/P: continue regimen- update 4-8 months tsh  Type II diabetes mellitus, well controlled S: well controlled On metformin 1000mg  BID CBGs- remains 80-110 range fasting Exercise and diet-lost another 2 lbs  Lab Results  Component Value Date   HGBA1C 5.7 01/08/2016   HGBA1C 5.7 10/08/2015   HGBA1C 5.7 01/07/2015   A/P: continue current regimen- update a1c today  PAROXYSMAL ATRIAL FIBRILLATION S: paroxysmal. Appears in sinus.occasional palpitations Compliant with coumadin, diltiazem, metoprolol.  A/P: rate control when in a fib, sinus otherwise, continue on anticoagulation.   Essential hypertension S: controlled diltiazem, metoprolol, valsrtan, trimterne-hctz BP Readings from Last 3 Encounters:  05/14/16 102/72  04/06/16 106/73  03/23/16 100/68  A/P:Continue current meds:  Trial valsartan 160mg  instead of 320 mg given control <107 3x in a row.   GERD S: patient tried zantac 150mg  BID and no difference A/P: will return to 300mg  once a day. Does not want to trial PPI- worried about SE profile.    No Follow-up on file.  No orders of the defined types were placed in this encounter.   Meds ordered this encounter  Medications  . valsartan (DIOVAN) 160 MG tablet    Sig: Take 1 tablet (160 mg total) by mouth daily.    Dispense:  30 tablet    Refill:  11    Return precautions advised.  Garret Reddish, MD

## 2016-05-14 NOTE — Progress Notes (Signed)
Pre visit review using our clinic review tool, if applicable. No additional management support is needed unless otherwise documented below in the visit note. 

## 2016-05-14 NOTE — Assessment & Plan Note (Signed)
S: well controlled On metformin 1000mg  BID CBGs- remains 80-110 range fasting Exercise and diet-lost another 2 lbs  Lab Results  Component Value Date   HGBA1C 5.7 01/08/2016   HGBA1C 5.7 10/08/2015   HGBA1C 5.7 01/07/2015   A/P: continue current regimen- update a1c today

## 2016-05-14 NOTE — Patient Instructions (Addendum)
Reduce valsartan to 160mg  when current 320mg  supply runs out  Return to 300mg  zantac once a day  a1c looks good at 5.2. Given your nausea I might try half a pill of metformin twice a day until you are feeling better. If not feeling better within a month can reutrn to full dose  Before your next visit- I would also like for you to sign up for an annual wellness visit on a Friday with our nurse Manuela Schwartz. This is a free benefit under medicare that may help [place] find additional ways to help you.

## 2016-05-14 NOTE — Assessment & Plan Note (Signed)
S: controlled diltiazem, metoprolol, valsrtan, trimterne-hctz BP Readings from Last 3 Encounters:  05/14/16 102/72  04/06/16 106/73  03/23/16 100/68  A/P:Continue current meds:  Trial valsartan 160mg  instead of 320 mg given control <107 3x in a row.

## 2016-05-18 ENCOUNTER — Telehealth: Payer: Self-pay | Admitting: Family Medicine

## 2016-05-18 NOTE — Telephone Encounter (Signed)
Calls because INR 3.8 at dentist office and needs to be below 2.5 for procedure on Wednesday. Her goal is 2.5-3.5. Reviewed meds- she was told by dentist to take half dose tonight and tomorrow. She wants to take next 2 nights off. I allowed her to take tonight off at 7.5 then take half dose tomorrow at 2.5. Resume prior dosing then Jenny Reichmann will be back next week to further manage. She may end up not being able to keep her Wednesday appointment at this point so will have to be in touch with [place] for further management.

## 2016-06-08 ENCOUNTER — Ambulatory Visit (INDEPENDENT_AMBULATORY_CARE_PROVIDER_SITE_OTHER): Payer: Medicare Other | Admitting: General Practice

## 2016-06-08 DIAGNOSIS — Z5181 Encounter for therapeutic drug level monitoring: Secondary | ICD-10-CM

## 2016-06-08 DIAGNOSIS — D6859 Other primary thrombophilia: Secondary | ICD-10-CM

## 2016-06-08 LAB — POCT INR: INR: 5.6

## 2016-06-08 NOTE — Patient Instructions (Signed)
Pre visit review using our clinic review tool, if applicable. No additional management support is needed unless otherwise documented below in the visit note. INR is high today.  Patient has not had an appetite and has had some vomiting.  I believe this is why the INR is supra-therapeutic.  Patient denies extra doses.  I am holding dosage for 2 days and reducing dosage by 10 percent.  Will re-check INR in 1 week.  Reiterated the risks of supra-therapeutic INR.  Pt verbalized understanding.  Encouraged patient to watch for and report any unusual bleeding.

## 2016-06-11 ENCOUNTER — Other Ambulatory Visit: Payer: Self-pay | Admitting: Family Medicine

## 2016-06-15 ENCOUNTER — Ambulatory Visit (INDEPENDENT_AMBULATORY_CARE_PROVIDER_SITE_OTHER): Payer: Medicare Other | Admitting: General Practice

## 2016-06-15 DIAGNOSIS — D6859 Other primary thrombophilia: Secondary | ICD-10-CM

## 2016-06-15 DIAGNOSIS — Z5181 Encounter for therapeutic drug level monitoring: Secondary | ICD-10-CM

## 2016-06-15 LAB — POCT INR: INR: 2.7

## 2016-06-18 ENCOUNTER — Other Ambulatory Visit: Payer: Self-pay | Admitting: Family Medicine

## 2016-06-18 ENCOUNTER — Encounter: Payer: Self-pay | Admitting: Family Medicine

## 2016-06-18 ENCOUNTER — Ambulatory Visit (INDEPENDENT_AMBULATORY_CARE_PROVIDER_SITE_OTHER)
Admission: RE | Admit: 2016-06-18 | Discharge: 2016-06-18 | Disposition: A | Discharge status: Home / Self Care | Payer: Medicare Other | Source: Ambulatory Visit | Attending: Family Medicine | Admitting: Family Medicine

## 2016-06-18 ENCOUNTER — Other Ambulatory Visit: Payer: Self-pay

## 2016-06-18 ENCOUNTER — Ambulatory Visit (INDEPENDENT_AMBULATORY_CARE_PROVIDER_SITE_OTHER): Payer: Medicare Other | Admitting: Family Medicine

## 2016-06-18 VITALS — BP 120/76 | HR 89 | Temp 97.4°F | Wt 165.0 lb

## 2016-06-18 DIAGNOSIS — R112 Nausea with vomiting, unspecified: Secondary | ICD-10-CM | POA: Diagnosis not present

## 2016-06-18 DIAGNOSIS — R1032 Left lower quadrant pain: Secondary | ICD-10-CM

## 2016-06-18 DIAGNOSIS — M549 Dorsalgia, unspecified: Secondary | ICD-10-CM | POA: Diagnosis not present

## 2016-06-18 DIAGNOSIS — R634 Abnormal weight loss: Secondary | ICD-10-CM

## 2016-06-18 DIAGNOSIS — R319 Hematuria, unspecified: Secondary | ICD-10-CM

## 2016-06-18 DIAGNOSIS — R8290 Unspecified abnormal findings in urine: Secondary | ICD-10-CM

## 2016-06-18 DIAGNOSIS — M546 Pain in thoracic spine: Secondary | ICD-10-CM | POA: Diagnosis not present

## 2016-06-18 LAB — POC URINALSYSI DIPSTICK (AUTOMATED)
BILIRUBIN UA: NEGATIVE
Blood, UA: NEGATIVE
Glucose, UA: NEGATIVE
KETONES UA: NEGATIVE
LEUKOCYTES UA: NEGATIVE
NITRITE UA: NEGATIVE
Spec Grav, UA: 1.02
Urobilinogen, UA: 0.2
pH, UA: 5.5

## 2016-06-18 MED ORDER — PROMETHAZINE HCL 12.5 MG PO TABS: 12.5000 mg | ORAL_TABLET | Freq: Four times a day (QID) | ORAL | 0 refills | Status: DC | PRN

## 2016-06-18 MED ORDER — TRAMADOL HCL 50 MG PO TABS: 50.0000 mg | ORAL_TABLET | Freq: Three times a day (TID) | ORAL | 0 refills | Status: DC | PRN
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
## 2016-06-18 NOTE — Progress Notes (Signed)
Pre visit review using our clinic review tool, if applicable. No additional management support is needed unless otherwise documented below in the visit note. 

## 2016-06-18 NOTE — Patient Instructions (Signed)
Labs before you leave  Please go to [facility] for back films - located [address] across the [address] from [place] - in the basement - Hours: 8:30-5:30 PM M-F. Do not need appointment.   We will call you about your CT scan- trying to do this tomorrow  Tramadol for pain, phenergan for nausea  If worsening symptoms- likely will need to go to the hospital for further workup- have to at least drink fluids

## 2016-06-19 ENCOUNTER — Other Ambulatory Visit: Payer: Self-pay | Admitting: Family Medicine

## 2016-06-19 ENCOUNTER — Ambulatory Visit (INDEPENDENT_AMBULATORY_CARE_PROVIDER_SITE_OTHER)
Admission: RE | Admit: 2016-06-19 | Discharge: 2016-06-19 | Disposition: A | Discharge status: Home / Self Care | Payer: Medicare Other | Source: Ambulatory Visit | Attending: Family Medicine | Admitting: Family Medicine

## 2016-06-19 ENCOUNTER — Telehealth: Payer: Self-pay | Admitting: Family Medicine

## 2016-06-19 DIAGNOSIS — R634 Abnormal weight loss: Secondary | ICD-10-CM

## 2016-06-19 DIAGNOSIS — R319 Hematuria, unspecified: Secondary | ICD-10-CM | POA: Diagnosis not present

## 2016-06-19 DIAGNOSIS — R1032 Left lower quadrant pain: Secondary | ICD-10-CM

## 2016-06-19 DIAGNOSIS — R112 Nausea with vomiting, unspecified: Secondary | ICD-10-CM

## 2016-06-19 DIAGNOSIS — R8290 Unspecified abnormal findings in urine: Secondary | ICD-10-CM

## 2016-06-19 LAB — COMPREHENSIVE METABOLIC PANEL
ALK PHOS: 50 U/L (ref 39–117)
ALT: 18 U/L (ref 0–35)
AST: 19 U/L (ref 0–37)
Albumin: 4.3 g/dL (ref 3.5–5.2)
BUN: 17 mg/dL (ref 6–23)
CO2: 30 meq/L (ref 19–32)
Calcium: 9.1 mg/dL (ref 8.4–10.5)
Chloride: 101 mEq/L (ref 96–112)
Creatinine, Ser: 1.28 mg/dL — ABNORMAL HIGH (ref 0.40–1.20)
GFR: 44.86 mL/min — ABNORMAL LOW (ref >60.00)
GLUCOSE: 86 mg/dL (ref 70–99)
POTASSIUM: 3.8 meq/L (ref 3.5–5.1)
SODIUM: 139 meq/L (ref 135–145)
TOTAL PROTEIN: 7.2 g/dL (ref 6.0–8.3)
Total Bilirubin: 1.1 mg/dL (ref 0.2–1.2)

## 2016-06-19 LAB — CBC WITH DIFFERENTIAL/PLATELET
BASOS ABS: 0 10*3/uL (ref 0.0–0.1)
BASOS PCT: 0.5 % (ref 0.0–3.0)
EOS ABS: 0.5 10*3/uL (ref 0.0–0.7)
Eosinophils Relative: 8.3 % — ABNORMAL HIGH (ref 0.0–5.0)
HEMATOCRIT: 35.6 % — AB (ref 36.0–46.0)
Hemoglobin: 12.1 g/dL (ref 12.0–15.0)
LYMPHS ABS: 2.2 10*3/uL (ref 0.7–4.0)
LYMPHS PCT: 38.8 % (ref 12.0–46.0)
MCHC: 33.9 g/dL (ref 30.0–36.0)
MCV: 106.2 fl — ABNORMAL HIGH (ref 78.0–100.0)
Monocytes Absolute: 0.3 10*3/uL (ref 0.1–1.0)
Monocytes Relative: 4.8 % (ref 3.0–12.0)
NEUTROS ABS: 2.7 10*3/uL (ref 1.4–7.7)
NEUTROS PCT: 47.6 % (ref 43.0–77.0)
PLATELETS: 225 10*3/uL (ref 150.0–400.0)
RBC: 3.35 Mil/uL — ABNORMAL LOW (ref 3.87–5.11)
RDW: 18.7 % — AB (ref 11.5–15.5)
WBC: 5.6 10*3/uL (ref 4.0–10.5)

## 2016-06-19 LAB — URINE CULTURE: Organism ID, Bacteria: NO GROWTH

## 2016-06-19 LAB — LIPASE: Lipase: 36 U/L (ref 11.0–59.0)

## 2016-06-19 MED ORDER — CIPROFLOXACIN HCL 500 MG PO TABS: 500.0000 mg | ORAL_TABLET | Freq: Two times a day (BID) | ORAL | 0 refills | Status: DC

## 2016-06-19 MED ORDER — METRONIDAZOLE 500 MG PO TABS: 500.0000 mg | ORAL_TABLET | Freq: Three times a day (TID) | ORAL | 0 refills | Status: DC

## 2016-06-19 NOTE — Progress Notes (Signed)
flagyl

## 2016-06-19 NOTE — Telephone Encounter (Signed)
I was called by pharmacy - cipro and coumadin interaction.   On cipro/flagyl.  Reviewed CT.  What sounds like true allergy to Augmentin.   Continue cipro.   Jenny Reichmann, can you get her into the Coumadin clinic with you in the early part of next week to recheck her INR?  Cc: Dr. Yong Channel

## 2016-06-20 LAB — LACTIC ACID, PLASMA: LACTIC ACID: 22 mg/dL — ABNORMAL HIGH (ref 4–16)

## 2016-06-20 NOTE — Telephone Encounter (Signed)
Thanks Dr. Lorelei Pont. I had noted the potential interaction and sent Jenny Reichmann a message as well- would have preferred augmentin as you noted but did not due to allergy. Appreciate you taking this call.

## 2016-06-22 ENCOUNTER — Telehealth: Payer: Self-pay | Admitting: Family Medicine

## 2016-06-22 ENCOUNTER — Telehealth: Payer: Self-pay | Admitting: *Deleted

## 2016-06-22 NOTE — Telephone Encounter (Signed)
Pt is returning jaime call

## 2016-06-22 NOTE — Progress Notes (Signed)
Patient will have INR checked tomorrow (Tuesday) at the [facility] office.

## 2016-06-22 NOTE — Telephone Encounter (Signed)
Message taken care of.   PLEASE NOTE: All timestamps contained within this report are represented as [place] Standard Time. CONFIDENTIALTY NOTICE: This fax transmission is intended only for the addressee. It contains information that is legally privileged, confidential or otherwise protected from use or disclosure. If you are not the intended recipient, you are strictly prohibited from reviewing, disclosing, copying using or disseminating any of this information or taking any action in reliance on or regarding this information. If you have received this fax in error, please notify us immediately by telephone so that we can arrange for its return to [place]. Phone: [phone], Toll-Free: [phone], Fax: [phone] Page: 1 of 2 Call Id: [id number] [facility] Night - Client [place] Patient Name: Erin Kirk Gender: Female DOB: [birth year] Age: 60 Y 78 M Return Phone Number: Address: City/State/Zip: [place] [facility] [place] Night - Client Client Site [facility] - Night Physician Garret Reddish - MD Contact Type Call Who Is Calling Pharmacy Call Type Pharmacy Send to RN Chief Complaint Paging or Request for Consult Reason for Call Request to speak to Physician Initial Comment Jenny Reichmann with [facility] cb# [phone], calling about a drug interaction for Cipro 500 and Warfarin . PT [name] Aug 16, 1956. PT is in store Additional [place] Name [facility] Pharmacist Name [place] Number [phone] Translation No Nurse Assessment Nurse: Tamala Julian, RN, Joelene Millin Date/Time [name] Time): 06/19/2016 8:09:18 PM Please select the assessment type ---Pharmacy clarification Additional Documentation ---Caller Jenny Reichmann with [facility] cb# [phone]) states she is calling about a drug interaction for Cipro 500mg s and Warfarin. Patient in the store. Is there an on-call physician for the  client? ---Yes Do the client directives allow paging the on call for medication concerns? ---Yes Document information that requires clarification. ---[facility] states there is a potential drug interaction with the patient's Warfarin and the Cipro ordered today. Pharmacy wants to verify that both medications can be taken by the patient. Guidelines Guideline Title Affirmed Question Affirmed Notes Nurse Date/Time (Eastern Time) Disp. Time [name] Time) Disposition Final User 06/19/2016 8:15:12 PM Called On-Call Provider Tamala Julian, RN, Joelene Millin 06/19/2016 8:21:01 PM Pharmacy Call Tamala Julian, RN, Joelene Millin Reason: Pharmacy call x 2 to [facility] [phone]) regarding potential drug interaction between Cipro (newly ordered) and Warfarin (patient already on). Clarified with on-call provider that patient is PLEASE NOTE: All timestamps contained within this report are represented as [place] Standard Time. CONFIDENTIALTY NOTICE: This fax transmission is intended only for the addressee. It contains information that is legally privileged, confidential or otherwise protected from use or disclosure. If you are not the intended recipient, you are strictly prohibited from reviewing, disclosing, copying using or disseminating any of this information or taking any action in reliance on or regarding this information. If you have received this fax in error, please notify us immediately by telephone so that we can arrange for its return to [place]. Phone: [phone], Toll-Free: [phone], Fax: [phone] Page: 2 of 2 Call Id: [id number] [place]. Time (Eastern Time) Disposition Final User to take Cipro as ordered. Patient's PCP and Coumadin Clinic will be informed and contact her on Monday regarding an INR test early next week. Pharmacist notified. 06/19/2016 8:21:05 PM Clinical Call Yes Tamala Julian, RN, [facility] Phone DateTime Result/Outcome Message Type Notes Owens Loffler - MD  [id number] 06/19/2016 8:15:12 PM Called On Call Provider - Reached Doctor Paged Owens Loffler - MD 06/19/2016 8:16:38 PM Spoke with On Call - General Message Result Dr. Lorelei Pont given  report regarding a patient ordered Cipro today and the pharmacy requesting clarification regarding potential interaction with patient's Warfarin. Dr. Lorelei Pont stated for pharmacy to fill the Cipro and for patient to take as directed. Dr. Lorelei Pont will send a note to patient's PCP and the Coumadin Clinic - they will call her on Monday regarding having her INR rechecked early next week.

## 2016-06-23 ENCOUNTER — Ambulatory Visit (INDEPENDENT_AMBULATORY_CARE_PROVIDER_SITE_OTHER): Payer: Medicare Other | Admitting: General Practice

## 2016-06-23 DIAGNOSIS — D6859 Other primary thrombophilia: Secondary | ICD-10-CM | POA: Diagnosis not present

## 2016-06-23 DIAGNOSIS — Z5181 Encounter for therapeutic drug level monitoring: Secondary | ICD-10-CM

## 2016-06-23 LAB — POCT INR: INR: 5.2

## 2016-06-23 NOTE — Telephone Encounter (Signed)
Pt is returning jaime call. Pt will be at [facility] around 10 for blood work

## 2016-06-23 NOTE — Patient Instructions (Signed)
Pre visit review using our clinic review tool, if applicable. No additional management support is needed unless otherwise documented below in the visit note. INR is high today due to patient taking Cipro and Flagyl.  I am holding coumadin for 2 days and reducing dosage 20 percent.  I will re-check patient on Monday, 10/9.  Encouraged patient to call if any unusual bleeding.  Patient verbalized understanding.

## 2016-06-24 ENCOUNTER — Encounter: Payer: Self-pay | Admitting: Family Medicine

## 2016-06-24 ENCOUNTER — Ambulatory Visit (INDEPENDENT_AMBULATORY_CARE_PROVIDER_SITE_OTHER): Payer: Medicare Other | Admitting: Family Medicine

## 2016-06-24 VITALS — BP 102/66 | HR 65 | Temp 98.0°F | Wt 167.4 lb

## 2016-06-24 DIAGNOSIS — K529 Noninfective gastroenteritis and colitis, unspecified: Secondary | ICD-10-CM | POA: Diagnosis not present

## 2016-06-24 NOTE — Patient Instructions (Signed)
In 2 weeks if doing better start 1/2 tablte metformin back and then full tablet week later. Can start sooner if doing much better next week.   Return if symptoms worsen again  Keep pushing fluids- repeat kidney function at next regular visit

## 2016-06-24 NOTE — Progress Notes (Signed)
Subjective:  Erin Kirk is a 60 y.o. year old very pleasant female patient who presents for/with See problem oriented charting ROS- no fever/chills. Nausea improved. Appetite loss better. No chest pain or shortness of breath. Back pain better..see any ROS included in HPI as well.   Past Medical History-  Patient Active Problem List   Diagnosis Date Noted  . Disability examination 01/17/2015    Priority: High  . Type II diabetes mellitus, well controlled ([place]) 11/28/2010    Priority: High  . PAROXYSMAL ATRIAL FIBRILLATION 07/02/2010    Priority: High  . Primary hypercoagulable state ([place]) 11/30/2007    Priority: High  . Hx of adenomatous polyp of colon 04/10/2016    Priority: Medium  . CKD (chronic kidney disease), stage III 09/05/2014    Priority: Medium  . Hypothyroidism 03/09/2007    Priority: Medium  . Essential hypertension 03/09/2007    Priority: Medium  . History of Melanoma 03/09/2007    Priority: Medium  . Encounter for therapeutic drug monitoring 10/26/2013    Priority: Low  . Left facial pain 09/24/2012    Priority: Low  . Oral mucosal lesion 09/24/2012    Priority: Low  . TRANSIENT DISORDER INITIATING/MAINTAINING SLEEP 04/02/2010    Priority: Low  . Overweight(278.02) 11/29/2009    Priority: Low  . LOW BACK PAIN 06/15/2008    Priority: Low  . ALLERGIC RHINITIS 03/09/2007    Priority: Low  . GERD 03/09/2007    Priority: Low    Medications- reviewed and updated Current Outpatient Prescriptions  Medication Sig Dispense Refill  . acetaminophen (TYLENOL) 325 MG tablet Take 650 mg by mouth every 6 (six) hours as needed for mild pain or fever. Reported on 01/13/2016    . BAYER CONTOUR TEST test strip USE ONE STRIP TO CHECK GLUCOSE ONCE DAILY 50 each 11  . ciprofloxacin (CIPRO) 500 MG tablet Take 1 tablet (500 mg total) by mouth 2 (two) times daily. 20 tablet 0  . diltiazem (CARDIZEM CD) 360 MG 24 hr capsule TAKE ONE CAPSULE BY MOUTH ONCE DAILY 90 capsule 3  .  folic acid (FOLVITE) 1 MG tablet TAKE ONE TABLET BY MOUTH ONCE DAILY 90 tablet 6  . levothyroxine (SYNTHROID, LEVOTHROID) 100 MCG tablet TAKE ONE TABLET BY MOUTH ONCE DAILY 90 tablet 2  . metFORMIN (GLUCOPHAGE) 1000 MG tablet TAKE ONE TABLET BY MOUTH TWICE DAILY WITH A MEAL 60 tablet 5  . metoprolol succinate (TOPROL-XL) 25 MG 24 hr tablet TAKE ONE TABLET BY MOUTH ONCE DAILY 90 tablet 0  . metroNIDAZOLE (FLAGYL) 500 MG tablet Take 1 tablet (500 mg total) by mouth 3 (three) times daily. Do not use alcohol while taking this medication. 30 tablet 0  . promethazine (PHENERGAN) 12.5 MG tablet Take 1 tablet (12.5 mg total) by mouth every 6 (six) hours as needed for nausea or vomiting. 30 tablet 0  . ranitidine (ZANTAC) 150 MG tablet Take 1 tablet (150 mg total) by mouth 2 (two) times daily. 180 tablet 3  . traMADol (ULTRAM) 50 MG tablet Take 1 tablet (50 mg total) by mouth every 8 (eight) hours as needed. 30 tablet 0  . triamterene-hydrochlorothiazide (MAXZIDE-25) 37.5-25 MG tablet TAKE ONE TABLET BY MOUTH DAILY 90 tablet 3  . valsartan (DIOVAN) 160 MG tablet Take 1 tablet (160 mg total) by mouth daily. 30 tablet 11  . Vitamin D, Ergocalciferol, (DRISDOL) 50000 UNITS CAPS capsule TAKE ONE CAPSULE BY MOUTH ONCE A WEEK. 52 capsule 3  . warfarin (COUMADIN) 5 MG  tablet TAKE BY MOUTH AS DIRECTED BY ANTICOAGULATION CLINIC 40 tablet 5   No current facility-administered medications for this visit.     Objective: BP 102/66 (BP Location: Left Arm, Patient Position: Sitting, Cuff Size: Normal)   Pulse 65   Temp 98 F (36.7 C) (Oral)   Wt 167 lb 6.4 oz (75.9 kg)   SpO2 97%   BMI 28.29 kg/m  Gen: NAD, appears less fatigued than last visit CV: RRR no murmurs rubs or gallops Lungs: CTAB no crackles, wheeze, rhonchi MSK: pain along left mid back. No midline pain.  Abdomen: soft/mild tenderness deep palpation LLQ not noted previously/nondistended/normal bowel sounds. No rebound or guarding.  Ext: no  edema Skin: warm, dry, no rash over side or back  Assessment/Plan:  Colitis S: Patient seen last week with Mid back pain on left side radiating into abdomen with 9 lbs weight loss in a month with symptoms going on about 3 weeks. Concern compression fracture but thoracic films negative. Did CT abd/pelvis but had to do noncontrast- potential mild colitis. Lactic acid up slightly - metformin held. Discussed possible ischemic colitis mild- doing relative bowel rest.   She was having pain 7-9/10 but now sometimes no pain and other times up to 5, comes and goes. She has been compliant with cipro/flagyl and had some diarrhea at first- is looking forward to coming off because each time she takes mild nausea. Has gained 2 lbs back! Mild bump in creatinine- has been pushing fluids- we opted to repeat at visit in December.  A/P: appears colitis was cause of weight loss and abdominal pain. Still having some back pain issues and this may be some back spasm as concurrent issue. Continue cipro/flagyl and will return if has worsening symptoms. Hold metformin 2 more weeks than restart- loosened diet to liquid and then BRAT when feeling better despite not being best diabetic choice- likely needs for healing.   The duration of face-to-face time during this visit was greater than 15 minutes. Greater than 50% of this time was spent in counseling primarily on steps from here and return precautions- with prior workup discussed unclear what next steps would be but would likely repeat labs as first step. Counseling on diet progression as well.    Return precautions advised.  Garret Reddish, MD

## 2016-06-24 NOTE — Telephone Encounter (Signed)
Spoke to patient. Patient is being seen by Dr. Yong Channel today

## 2016-06-24 NOTE — Progress Notes (Signed)
Pre visit review using our clinic review tool, if applicable. No additional management support is needed unless otherwise documented below in the visit note. 

## 2016-06-29 ENCOUNTER — Ambulatory Visit (INDEPENDENT_AMBULATORY_CARE_PROVIDER_SITE_OTHER): Payer: Medicare Other | Admitting: General Practice

## 2016-06-29 ENCOUNTER — Other Ambulatory Visit: Payer: Self-pay | Admitting: Family Medicine

## 2016-06-29 ENCOUNTER — Ambulatory Visit: Payer: Self-pay

## 2016-06-29 DIAGNOSIS — Z5181 Encounter for therapeutic drug level monitoring: Secondary | ICD-10-CM | POA: Diagnosis not present

## 2016-06-29 DIAGNOSIS — D6859 Other primary thrombophilia: Secondary | ICD-10-CM

## 2016-06-29 LAB — POCT INR: INR: 5.4

## 2016-07-06 ENCOUNTER — Ambulatory Visit (INDEPENDENT_AMBULATORY_CARE_PROVIDER_SITE_OTHER): Payer: Medicare Other | Admitting: General Practice

## 2016-07-06 DIAGNOSIS — D6859 Other primary thrombophilia: Secondary | ICD-10-CM

## 2016-07-06 DIAGNOSIS — Z5181 Encounter for therapeutic drug level monitoring: Secondary | ICD-10-CM | POA: Diagnosis not present

## 2016-07-06 LAB — POCT INR: INR: 3.2

## 2016-07-06 NOTE — Patient Instructions (Signed)
Pre visit review using our clinic review tool, if applicable. No additional management support is needed unless otherwise documented below in the visit note. 

## 2016-07-22 ENCOUNTER — Ambulatory Visit (INDEPENDENT_AMBULATORY_CARE_PROVIDER_SITE_OTHER): Payer: Medicare Other | Admitting: General Practice

## 2016-07-22 DIAGNOSIS — Z5181 Encounter for therapeutic drug level monitoring: Secondary | ICD-10-CM

## 2016-07-22 DIAGNOSIS — D6859 Other primary thrombophilia: Secondary | ICD-10-CM

## 2016-07-22 LAB — POCT INR: INR: 3.4

## 2016-07-22 NOTE — Patient Instructions (Signed)
Pre visit review using our clinic review tool, if applicable. No additional management support is needed unless otherwise documented below in the visit note. 

## 2016-07-22 NOTE — Progress Notes (Signed)
I have reviewed and agree with the plan. 

## 2016-07-24 ENCOUNTER — Ambulatory Visit: Payer: Self-pay

## 2016-08-04 ENCOUNTER — Other Ambulatory Visit: Payer: Self-pay | Admitting: Family Medicine

## 2016-08-07 DIAGNOSIS — C44712 Basal cell carcinoma of skin of right lower limb, including hip: Secondary | ICD-10-CM | POA: Diagnosis not present

## 2016-08-07 DIAGNOSIS — D225 Melanocytic nevi of trunk: Secondary | ICD-10-CM | POA: Diagnosis not present

## 2016-08-07 DIAGNOSIS — D2271 Melanocytic nevi of right lower limb, including hip: Secondary | ICD-10-CM | POA: Diagnosis not present

## 2016-08-07 DIAGNOSIS — Z85820 Personal history of malignant melanoma of skin: Secondary | ICD-10-CM | POA: Diagnosis not present

## 2016-08-07 DIAGNOSIS — D1801 Hemangioma of skin and subcutaneous tissue: Secondary | ICD-10-CM | POA: Diagnosis not present

## 2016-08-07 DIAGNOSIS — D2262 Melanocytic nevi of left upper limb, including shoulder: Secondary | ICD-10-CM | POA: Diagnosis not present

## 2016-08-07 DIAGNOSIS — C44719 Basal cell carcinoma of skin of left lower limb, including hip: Secondary | ICD-10-CM | POA: Diagnosis not present

## 2016-08-15 ENCOUNTER — Other Ambulatory Visit: Payer: Self-pay | Admitting: Family Medicine

## 2016-08-24 ENCOUNTER — Ambulatory Visit (INDEPENDENT_AMBULATORY_CARE_PROVIDER_SITE_OTHER): Payer: Medicare Other | Admitting: General Practice

## 2016-08-24 DIAGNOSIS — Z5181 Encounter for therapeutic drug level monitoring: Secondary | ICD-10-CM

## 2016-08-24 DIAGNOSIS — D6859 Other primary thrombophilia: Secondary | ICD-10-CM

## 2016-08-24 LAB — POCT INR: INR: 3.6

## 2016-08-24 NOTE — Patient Instructions (Signed)
Pre visit review using our clinic review tool, if applicable. No additional management support is needed unless otherwise documented below in the visit note. 

## 2016-08-25 DIAGNOSIS — G5603 Carpal tunnel syndrome, bilateral upper limbs: Secondary | ICD-10-CM | POA: Diagnosis not present

## 2016-08-25 DIAGNOSIS — M542 Cervicalgia: Secondary | ICD-10-CM | POA: Diagnosis not present

## 2016-08-28 ENCOUNTER — Ambulatory Visit (INDEPENDENT_AMBULATORY_CARE_PROVIDER_SITE_OTHER): Payer: Medicare Other | Admitting: Family Medicine

## 2016-08-28 ENCOUNTER — Encounter: Payer: Self-pay | Admitting: Family Medicine

## 2016-08-28 VITALS — BP 124/82 | HR 83 | Temp 97.9°F | Ht 64.5 in | Wt 160.2 lb

## 2016-08-28 DIAGNOSIS — R200 Anesthesia of skin: Secondary | ICD-10-CM | POA: Diagnosis not present

## 2016-08-28 LAB — VITAMIN B12: VITAMIN B 12: 109 pg/mL — AB (ref 200–1100)

## 2016-08-28 LAB — TSH: TSH: 0.68 m[IU]/L

## 2016-08-28 NOTE — Progress Notes (Signed)
Pre visit review using our clinic review tool, if applicable. No additional management support is needed unless otherwise documented below in the visit note. 

## 2016-08-28 NOTE — Patient Instructions (Signed)
Check b12 and thyroid  Keep hand appointment for now  Stop metformin until I see you, take b12 1087mcg or 1 mg daily until we talk on Monday. If low b12- will do injetions

## 2016-08-28 NOTE — Progress Notes (Signed)
Subjective:  Erin Kirk is a 60 y.o. year old very pleasant female patient who presents for/with See problem oriented charting ROS- no weakness, numbness or tingling past the wrist proximally. No fever, chills. No facial or extremity weakness. No slurred words or trouble swallowing. no blurry vision or double vision. No paresthesias other than in hands. No confusion or word finding difficulties.    Past Medical History-  Patient Active Problem List   Diagnosis Date Noted  . Disability examination 01/17/2015    Priority: High  . Type II diabetes mellitus, well controlled ([place]) 11/28/2010    Priority: High  . PAROXYSMAL ATRIAL FIBRILLATION 07/02/2010    Priority: High  . Primary hypercoagulable state ([place]) 11/30/2007    Priority: High  . Hx of adenomatous polyp of colon 04/10/2016    Priority: Medium  . CKD (chronic kidney disease), stage III 09/05/2014    Priority: Medium  . Hypothyroidism 03/09/2007    Priority: Medium  . Essential hypertension 03/09/2007    Priority: Medium  . History of Melanoma 03/09/2007    Priority: Medium  . Encounter for therapeutic drug monitoring 10/26/2013    Priority: Low  . Left facial pain 09/24/2012    Priority: Low  . Oral mucosal lesion 09/24/2012    Priority: Low  . TRANSIENT DISORDER INITIATING/MAINTAINING SLEEP 04/02/2010    Priority: Low  . Overweight(278.02) 11/29/2009    Priority: Low  . LOW BACK PAIN 06/15/2008    Priority: Low  . ALLERGIC RHINITIS 03/09/2007    Priority: Low  . GERD 03/09/2007    Priority: Low    Medications- reviewed and updated Current Outpatient Prescriptions  Medication Sig Dispense Refill  . acetaminophen (TYLENOL) 325 MG tablet Take 650 mg by mouth every 6 (six) hours as needed for mild pain or fever. Reported on 01/13/2016    . BAYER CONTOUR TEST test strip USE ONE STRIP TO CHECK GLUCOSE ONCE DAILY 50 each 11  . diltiazem (CARDIZEM CD) 360 MG 24 hr capsule TAKE ONE CAPSULE BY MOUTH ONCE DAILY 90  capsule 3  . folic acid (FOLVITE) 1 MG tablet TAKE ONE TABLET BY MOUTH ONCE DAILY 90 tablet 6  . levothyroxine (SYNTHROID, LEVOTHROID) 100 MCG tablet TAKE ONE TABLET BY MOUTH ONCE DAILY 90 tablet 2  . metFORMIN (GLUCOPHAGE) 1000 MG tablet TAKE ONE TABLET BY MOUTH TWICE DAILY WITH A MEAL 60 tablet 5  . metoprolol succinate (TOPROL-XL) 25 MG 24 hr tablet TAKE ONE TABLET BY MOUTH ONCE DAILY 90 tablet 1  . promethazine (PHENERGAN) 12.5 MG tablet Take 1 tablet (12.5 mg total) by mouth every 6 (six) hours as needed for nausea or vomiting. 30 tablet 0  . ranitidine (ZANTAC) 150 MG tablet Take 1 tablet (150 mg total) by mouth 2 (two) times daily. 180 tablet 3  . traMADol (ULTRAM) 50 MG tablet Take 1 tablet (50 mg total) by mouth every 8 (eight) hours as needed. 30 tablet 0  . triamterene-hydrochlorothiazide (MAXZIDE-25) 37.5-25 MG tablet TAKE ONE TABLET BY MOUTH DAILY 90 tablet 3  . valsartan (DIOVAN) 160 MG tablet Take 1 tablet (160 mg total) by mouth daily. 30 tablet 11  . Vitamin D, Ergocalciferol, (DRISDOL) 50000 UNITS CAPS capsule TAKE ONE CAPSULE BY MOUTH ONCE A WEEK. 52 capsule 3  . warfarin (COUMADIN) 5 MG tablet TAKE BY MOUTH AS DIRECTED BY ANTICOAGULATION CLINIC 40 tablet 5   No current facility-administered medications for this visit.     Objective: BP 124/82 (BP Location: Left Arm, Patient Position:  Sitting, Cuff Size: Normal)   Pulse 83   Temp 97.9 F (36.6 C) (Oral)   Ht 5' 4.5" (1.638 m)   Wt 160 lb 3.2 oz (72.7 kg)   SpO2 99%   BMI 27.07 kg/m  Gen: NAD, resting comfortably Msk: spurling negative CV: RRR no murmurs rubs or gallops Lungs: CTAB no crackles, wheeze, rhonchi Ext: no edema Skin: warm, dry, no rash Neuro: 5/5 strength in bilateral upper extremities except 4/5 for bilateral grip strenght, denies sensation to gross touch in hands  Assessment/Plan:  Bilateral hand numbness  S: 2 weeks of symptoms .started out with mild numbness/tingling and pain in bilateral hands  and has now progressed to severe 10/10 pain in both. Hands feel so numb that it is hard to feel objects in her hand at times. Grip does not feel as strong but is able to grip steering wheel and feel position. Involves full palm and back of hand but does not go past wrist. Saw orthopedics last week and apparently had a scan for carpal tunnel and as told not likely. Referred to hand surgery.   Of note, she is on metformin and asks about possible b12 deficiency.  A/P: bilateral hand numbness and pain of unclear etiology. spurling negative and has seen ortho who did not think cervical in origin. Strongly doubt cva will get b12 and tsh to rule out these as cause. Suspect b12 issue possible but severity of pain surprising. No swelling of arm to suggest DVt and compliant with her coumadin. Will hold metformin for now and take b12 supplement over weekend- may need to begin injections next week  Prednisone trial was discussed apparently and patient wants to decline for now and await labwork and hand surgery evaluation  Orders Placed This Encounter  Procedures  . Vitamin B12  . TSH   Return precautions advised. See avs Garret Reddish, MD

## 2016-08-30 ENCOUNTER — Other Ambulatory Visit: Payer: Self-pay | Admitting: Family Medicine

## 2016-08-31 ENCOUNTER — Other Ambulatory Visit: Payer: Self-pay

## 2016-08-31 ENCOUNTER — Telehealth: Payer: Self-pay | Admitting: Family Medicine

## 2016-08-31 ENCOUNTER — Ambulatory Visit (INDEPENDENT_AMBULATORY_CARE_PROVIDER_SITE_OTHER): Payer: Medicare Other

## 2016-08-31 DIAGNOSIS — E538 Deficiency of other specified B group vitamins: Secondary | ICD-10-CM

## 2016-08-31 MED ORDER — CYANOCOBALAMIN 1000 MCG/ML IJ SOLN
1000.0000 ug | Freq: Once | INTRAMUSCULAR | Status: AC
  Administered 2016-08-31: 1000 ug via INTRAMUSCULAR

## 2016-09-02 ENCOUNTER — Telehealth: Payer: Self-pay | Admitting: Family Medicine

## 2016-09-02 NOTE — Telephone Encounter (Signed)
error 

## 2016-09-02 NOTE — Telephone Encounter (Signed)
Pt would like to know if there is anything else pt could do to speed up the process of the B12 effects.  Pt states she cannot write yet, not much improvement overall.   Pt aware [name] half day today.

## 2016-09-03 NOTE — Telephone Encounter (Signed)
Id be ok with doing another injection on Friday and then one next week. Hopeful for improvement-

## 2016-09-07 ENCOUNTER — Encounter: Payer: Self-pay | Admitting: Family Medicine

## 2016-09-07 ENCOUNTER — Ambulatory Visit (INDEPENDENT_AMBULATORY_CARE_PROVIDER_SITE_OTHER): Payer: Medicare Other | Admitting: Family Medicine

## 2016-09-07 VITALS — BP 100/68 | HR 76 | Temp 98.4°F | Ht 64.5 in | Wt 162.0 lb

## 2016-09-07 DIAGNOSIS — R200 Anesthesia of skin: Secondary | ICD-10-CM

## 2016-09-07 DIAGNOSIS — E538 Deficiency of other specified B group vitamins: Secondary | ICD-10-CM

## 2016-09-07 MED ORDER — CYANOCOBALAMIN 1000 MCG/ML IJ SOLN
1000.0000 ug | Freq: Once | INTRAMUSCULAR | Status: AC
  Administered 2016-09-07: 1000 ug via INTRAMUSCULAR

## 2016-09-07 NOTE — Assessment & Plan Note (Addendum)
S: Patient got first B12 injection last week with repeat today planned. She originally presented with hand numbness and pain last visit with a concern for b12 deficiency given prior metformin use. Level was 109 when checked. Today, reviewed last CBC in September and did have macrocytosis at that time (but we were focused on acute illness at that time and was a new finding)- discussed this likely was first sign of the b12 deficiency. Patient states has noted some improvement, less pain, less numbness but only slightly so. Can feel her palms but fingertrips still feel numb.  A/P: improving symptoms but only mildly- extended conversation about timeframe( and mental effects on her) for this to improve up to 3 month to 1 year and could have some residual deficiets. q4 weeks of B12 weekly injections. Will get cbc and b12 level on January 8th when here for coumadin check. Will continue to hold metformin. Given severity of her symptoms- she would also like to discuss with neurology so referred at this time.

## 2016-09-07 NOTE — Progress Notes (Signed)
Subjective:  Erin Kirk is a 60 y.o. year old very pleasant female patient who presents for/with See problem oriented charting ROS- some weakness, numbness, tingling in hands- all improving slightly. .   Past Medical History-  Patient Active Problem List   Diagnosis Date Noted  . Low vitamin B12 level 09/07/2016    Priority: High  . Disability examination 01/17/2015    Priority: High  . Type II diabetes mellitus, well controlled ([place]) 11/28/2010    Priority: High  . PAROXYSMAL ATRIAL FIBRILLATION 07/02/2010    Priority: High  . Primary hypercoagulable state ([place]) 11/30/2007    Priority: High  . Hx of adenomatous polyp of colon 04/10/2016    Priority: Medium  . CKD (chronic kidney disease), stage III 09/05/2014    Priority: Medium  . Hypothyroidism 03/09/2007    Priority: Medium  . Essential hypertension 03/09/2007    Priority: Medium  . History of Melanoma 03/09/2007    Priority: Medium  . Encounter for therapeutic drug monitoring 10/26/2013    Priority: Low  . Left facial pain 09/24/2012    Priority: Low  . Oral mucosal lesion 09/24/2012    Priority: Low  . TRANSIENT DISORDER INITIATING/MAINTAINING SLEEP 04/02/2010    Priority: Low  . Overweight(278.02) 11/29/2009    Priority: Low  . LOW BACK PAIN 06/15/2008    Priority: Low  . ALLERGIC RHINITIS 03/09/2007    Priority: Low  . GERD 03/09/2007    Priority: Low    Medications- reviewed and updated Current Outpatient Prescriptions  Medication Sig Dispense Refill  . acetaminophen (TYLENOL) 325 MG tablet Take 650 mg by mouth every 6 (six) hours as needed for mild pain or fever. Reported on 01/13/2016    . BAYER CONTOUR TEST test strip USE ONE STRIP TO CHECK GLUCOSE ONCE DAILY 50 each 11  . diltiazem (CARDIZEM CD) 360 MG 24 hr capsule TAKE ONE CAPSULE BY MOUTH ONCE DAILY 90 capsule 3  . folic acid (FOLVITE) 1 MG tablet TAKE ONE TABLET BY MOUTH ONCE DAILY 90 tablet 6  . levothyroxine (SYNTHROID, LEVOTHROID) 100 MCG  tablet TAKE ONE TABLET BY MOUTH ONCE DAILY 90 tablet 2  . metoprolol succinate (TOPROL-XL) 25 MG 24 hr tablet TAKE ONE TABLET BY MOUTH ONCE DAILY 90 tablet 1  . promethazine (PHENERGAN) 12.5 MG tablet Take 1 tablet (12.5 mg total) by mouth every 6 (six) hours as needed for nausea or vomiting. 30 tablet 0  . ranitidine (ZANTAC) 150 MG tablet Take 1 tablet (150 mg total) by mouth 2 (two) times daily. 180 tablet 3  . traMADol (ULTRAM) 50 MG tablet Take 1 tablet (50 mg total) by mouth every 8 (eight) hours as needed. 30 tablet 0  . triamterene-hydrochlorothiazide (MAXZIDE-25) 37.5-25 MG tablet TAKE ONE TABLET BY MOUTH ONCE DAILY. 90 tablet 3  . valsartan (DIOVAN) 160 MG tablet Take 1 tablet (160 mg total) by mouth daily. 30 tablet 11  . Vitamin D, Ergocalciferol, (DRISDOL) 50000 UNITS CAPS capsule TAKE ONE CAPSULE BY MOUTH ONCE A WEEK. 52 capsule 3  . warfarin (COUMADIN) 5 MG tablet TAKE BY MOUTH AS DIRECTED BY ANTICOAGULATION CLINIC 40 tablet 5  . metFORMIN (GLUCOPHAGE) 1000 MG tablet TAKE ONE TABLET BY MOUTH TWICE DAILY WITH A MEAL (Patient not taking: Reported on 09/07/2016) 60 tablet 5   No current facility-administered medications for this visit.     Objective: BP 100/68 (BP Location: Left Arm, Patient Position: Sitting, Cuff Size: Normal)   Pulse 76   Temp 98.4 F (  36.9 C) (Oral)   Ht 5' 4.5" (1.638 m)   Wt 162 lb (73.5 kg)   SpO2 98%   BMI 27.38 kg/m  Gen: NAD, resting comfortably CV: RRR no murmurs rubs or gallops Lungs: CTAB no crackles, wheeze, rhonchi Abdomen: soft/nontender/nondistended/normal bowel sounds. No rebound or guarding.  Ext: no edema Skin: warm, dry Neuro:4+/5 grip strength in hands- slight improvement, 5/5 in upper extremities.   Assessment/Plan:    Low vitamin B12 level S: Patient got first B12 injection last week with repeat today planned. She originally presented with hand numbness and pain last visit with a concern for b12 deficiency given prior metformin  use. Level was 109 when checked. Today, reviewed last CBC in September and did have macrocytosis at that time (but we were focused on acute illness at that time and was a new finding)- discussed this likely was first sign of the b12 deficiency. Patient states has noted some improvement, less pain, less numbness but only slightly so. Can feel her palms but fingertrips still feel numb.  A/P: improving symptoms but only mildly- extended conversation about timeframe( and mental effects on her) for this to improve up to 3 month to 1 year and could have some residual deficiets. q4 weeks of B12 weekly injections. Will get cbc and b12 level on January 8th when here for coumadin check. Will continue to hold metformin. Given severity of her symptoms- she would also like to discuss with neurology so referred at this time.   Also note- no neck pain.   Plan based on labs 09/28/16.   Orders Placed This Encounter  Procedures  . CBC    Standing Status:   Future    Standing Expiration Date:   09/07/2017  . Vitamin B12    Standing Status:   Future    Standing Expiration Date:   09/07/2017  . Ambulatory referral to Neurology    Referral Priority:   Routine    Referral Type:   Consultation    Referral Reason:   Specialty Services Required    Requested Specialty:   Neurology    Number of Visits Requested:   1    Meds ordered this encounter  Medications  . cyanocobalamin ((VITAMIN B-12)) injection 1,000 mcg   The duration of face-to-face time during this visit was greater than 15 minutes. Greater than 50% of this time was spent in counseling as noted above.   Return precautions advised.  Garret Reddish, MD

## 2016-09-07 NOTE — Progress Notes (Signed)
Pre visit review using our clinic review tool, if applicable. No additional management support is needed unless otherwise documented below in the visit note. 

## 2016-09-07 NOTE — Patient Instructions (Addendum)
From up to date:  Neuropsychiatric improvement after treatment of vitamin B12 deficiency often occurs over a longer period of time (eg, starting within approximately three months and continuing to improve for as long as one year). Some experts report transient worsening of neurologic symptoms before improvement [3]. However, some neurologic findings may be irreversible, especially if they have been present for a long time before the deficiency was corrected [32]. In a 1991 series involving 121 individuals with vitamin B12 deficiency with neurologic findings, all had some neurologic improvement, to a degree that was inversely related to the extent and duration of disease [33]. Neurologic recovery was complete in 57 (47 percent), and only 7 (6 percent) had residual long-term moderate to severe neurologic disability.  B12 injection before you leave today and continue weekly for next 4 weeks then get B12 level checked. Will decide on long term treatment after that  We will call you within a week about your referral to neurology. If you do not hear within 2 weeks, give [place] a call.   When you come in on the 8th- have them schedule a lab visit to check on cbc and b12

## 2016-09-15 ENCOUNTER — Ambulatory Visit (INDEPENDENT_AMBULATORY_CARE_PROVIDER_SITE_OTHER): Payer: Medicare Other

## 2016-09-15 DIAGNOSIS — E538 Deficiency of other specified B group vitamins: Secondary | ICD-10-CM | POA: Diagnosis not present

## 2016-09-15 MED ORDER — CYANOCOBALAMIN 1000 MCG/ML IJ SOLN
1000.0000 ug | Freq: Once | INTRAMUSCULAR | Status: AC
  Administered 2016-09-15: 1000 ug via INTRAMUSCULAR

## 2016-09-22 ENCOUNTER — Ambulatory Visit (INDEPENDENT_AMBULATORY_CARE_PROVIDER_SITE_OTHER): Payer: Medicare Other

## 2016-09-22 DIAGNOSIS — E538 Deficiency of other specified B group vitamins: Secondary | ICD-10-CM | POA: Diagnosis not present

## 2016-09-22 MED ORDER — CYANOCOBALAMIN 1000 MCG/ML IJ SOLN
1000.0000 ug | Freq: Once | INTRAMUSCULAR | Status: AC
  Administered 2016-09-22: 1000 ug via INTRAMUSCULAR

## 2016-09-22 NOTE — Progress Notes (Signed)
Patient has her B12 injection today 09/22/16 for her Vit B12 Deficiency. Given by Megan Mans

## 2016-09-22 NOTE — Progress Notes (Signed)
I have reviewed and agree with note, evaluation, plan.   Yaiza Palazzola, MD  

## 2016-09-28 ENCOUNTER — Other Ambulatory Visit: Payer: Medicare Other

## 2016-09-28 ENCOUNTER — Ambulatory Visit (INDEPENDENT_AMBULATORY_CARE_PROVIDER_SITE_OTHER): Payer: Medicare Other | Admitting: General Practice

## 2016-09-28 ENCOUNTER — Ambulatory Visit (INDEPENDENT_AMBULATORY_CARE_PROVIDER_SITE_OTHER): Payer: Medicare Other

## 2016-09-28 DIAGNOSIS — Z5181 Encounter for therapeutic drug level monitoring: Secondary | ICD-10-CM | POA: Diagnosis not present

## 2016-09-28 DIAGNOSIS — E538 Deficiency of other specified B group vitamins: Secondary | ICD-10-CM

## 2016-09-28 DIAGNOSIS — D6859 Other primary thrombophilia: Secondary | ICD-10-CM

## 2016-09-28 DIAGNOSIS — R200 Anesthesia of skin: Secondary | ICD-10-CM

## 2016-09-28 LAB — CBC WITH DIFFERENTIAL/PLATELET
BASOS ABS: 0.1 10*3/uL (ref 0.0–0.1)
Basophils Relative: 1 % (ref 0.0–3.0)
EOS ABS: 0.2 10*3/uL (ref 0.0–0.7)
Eosinophils Relative: 3.7 % (ref 0.0–5.0)
HEMATOCRIT: 32.4 % — AB (ref 36.0–46.0)
HEMOGLOBIN: 10.9 g/dL — AB (ref 12.0–15.0)
LYMPHS PCT: 24.7 % (ref 12.0–46.0)
Lymphs Abs: 1.4 10*3/uL (ref 0.7–4.0)
MCHC: 33.8 g/dL (ref 30.0–36.0)
MCV: 102.3 fl — ABNORMAL HIGH (ref 78.0–100.0)
Monocytes Absolute: 0.4 10*3/uL (ref 0.1–1.0)
Monocytes Relative: 7 % (ref 3.0–12.0)
NEUTROS ABS: 3.5 10*3/uL (ref 1.4–7.7)
Neutrophils Relative %: 63.6 % (ref 43.0–77.0)
PLATELETS: 231 10*3/uL (ref 150.0–400.0)
RBC: 3.17 Mil/uL — AB (ref 3.87–5.11)
RDW: 19.4 % — ABNORMAL HIGH (ref 11.5–15.5)
WBC: 5.5 10*3/uL (ref 4.0–10.5)

## 2016-09-28 LAB — VITAMIN B12: VITAMIN B 12: 676 pg/mL (ref 211–911)

## 2016-09-28 LAB — POCT INR: INR: 4.3

## 2016-09-28 MED ORDER — CYANOCOBALAMIN 1000 MCG/ML IJ SOLN
1000.0000 ug | Freq: Once | INTRAMUSCULAR | Status: AC
  Administered 2016-09-28: 1000 ug via INTRAMUSCULAR

## 2016-09-28 NOTE — Patient Instructions (Signed)
Pre visit review using our clinic review tool, if applicable. No additional management support is needed unless otherwise documented below in the visit note. 

## 2016-09-28 NOTE — Progress Notes (Signed)
Patient had her B12 Injection today 09/28/2016 at 10.15am for her Vit B12 deficiency. Given by Rosealee Albee CMA.

## 2016-10-05 ENCOUNTER — Ambulatory Visit (INDEPENDENT_AMBULATORY_CARE_PROVIDER_SITE_OTHER): Payer: Medicare Other | Admitting: Emergency Medicine

## 2016-10-05 DIAGNOSIS — E538 Deficiency of other specified B group vitamins: Secondary | ICD-10-CM

## 2016-10-05 MED ORDER — CYANOCOBALAMIN 1000 MCG/ML IJ SOLN
1000.0000 ug | Freq: Once | INTRAMUSCULAR | Status: AC
  Administered 2016-10-05: 1000 ug via INTRAMUSCULAR

## 2016-10-05 NOTE — Progress Notes (Signed)
I have reviewed and agree with note, evaluation, plan.   Stephen Hunter, MD  

## 2016-10-06 ENCOUNTER — Telehealth: Payer: Self-pay | Admitting: Family Medicine

## 2016-10-06 NOTE — Telephone Encounter (Signed)
Spoke to pt, told her discussed with Dr. Yong Channel and he said okay to have injections weekly until visit with Neurology. Pt verbalized understanding and will call back and schedule.

## 2016-10-06 NOTE — Telephone Encounter (Signed)
° ° ° ° ° °  Pt call to say when she received her b12 this week she said she was told she would get a call to let her know if she need to get another shot in a week or in a month. Would like a call back

## 2016-10-12 ENCOUNTER — Ambulatory Visit (INDEPENDENT_AMBULATORY_CARE_PROVIDER_SITE_OTHER): Payer: Medicare Other

## 2016-10-12 DIAGNOSIS — E538 Deficiency of other specified B group vitamins: Secondary | ICD-10-CM | POA: Diagnosis not present

## 2016-10-12 MED ORDER — CYANOCOBALAMIN 1000 MCG/ML IJ SOLN
1000.0000 ug | Freq: Once | INTRAMUSCULAR | Status: AC
  Administered 2016-10-12: 1000 ug via INTRAMUSCULAR

## 2016-10-12 NOTE — Progress Notes (Signed)
Pt here for Vit B12 injection due to low vitamin B12 level. Pt tolerated injection well

## 2016-10-19 ENCOUNTER — Ambulatory Visit (INDEPENDENT_AMBULATORY_CARE_PROVIDER_SITE_OTHER): Payer: Medicare Other

## 2016-10-19 DIAGNOSIS — E538 Deficiency of other specified B group vitamins: Secondary | ICD-10-CM | POA: Diagnosis not present

## 2016-10-19 MED ORDER — CYANOCOBALAMIN 1000 MCG/ML IJ SOLN
1000.0000 ug | Freq: Once | INTRAMUSCULAR | Status: AC
  Administered 2016-10-19: 1000 ug via INTRAMUSCULAR

## 2016-10-19 NOTE — Progress Notes (Signed)
Pt here for a Vit B12 injection as she has a low vit B12 level.

## 2016-10-26 ENCOUNTER — Ambulatory Visit (INDEPENDENT_AMBULATORY_CARE_PROVIDER_SITE_OTHER): Payer: Medicare Other

## 2016-10-26 ENCOUNTER — Ambulatory Visit (INDEPENDENT_AMBULATORY_CARE_PROVIDER_SITE_OTHER): Payer: Medicare Other | Admitting: General Practice

## 2016-10-26 DIAGNOSIS — Z5181 Encounter for therapeutic drug level monitoring: Secondary | ICD-10-CM | POA: Diagnosis not present

## 2016-10-26 DIAGNOSIS — E538 Deficiency of other specified B group vitamins: Secondary | ICD-10-CM | POA: Diagnosis not present

## 2016-10-26 DIAGNOSIS — D6859 Other primary thrombophilia: Secondary | ICD-10-CM

## 2016-10-26 LAB — POCT INR: INR: 2.8

## 2016-10-26 MED ORDER — CYANOCOBALAMIN 1000 MCG/ML IJ SOLN
1000.0000 ug | Freq: Once | INTRAMUSCULAR | Status: AC
Start: 2016-10-26 — End: 2016-10-26
  Administered 2016-10-26: 1000 ug via INTRAMUSCULAR

## 2016-10-26 NOTE — Progress Notes (Signed)
b12 deficient. Injection completed.   Garret Reddish

## 2016-10-26 NOTE — Patient Instructions (Signed)
Pre visit review using our clinic review tool, if applicable. No additional management support is needed unless otherwise documented below in the visit note. 

## 2016-11-02 ENCOUNTER — Ambulatory Visit (INDEPENDENT_AMBULATORY_CARE_PROVIDER_SITE_OTHER): Payer: Medicare Other

## 2016-11-02 DIAGNOSIS — E538 Deficiency of other specified B group vitamins: Secondary | ICD-10-CM | POA: Diagnosis not present

## 2016-11-02 MED ORDER — CYANOCOBALAMIN 1000 MCG/ML IJ SOLN
1000.0000 ug | Freq: Once | INTRAMUSCULAR | Status: AC
  Administered 2016-11-02: 1000 ug via INTRAMUSCULAR

## 2016-11-02 NOTE — Progress Notes (Addendum)
Pt here for Vit B12 injection due to low Vit B12. Pt tolerated injection well  I have reviewed and agree with note, evaluation, plan.   Garret Reddish, MD

## 2016-11-09 ENCOUNTER — Ambulatory Visit (INDEPENDENT_AMBULATORY_CARE_PROVIDER_SITE_OTHER): Payer: Medicare Other

## 2016-11-09 DIAGNOSIS — E538 Deficiency of other specified B group vitamins: Secondary | ICD-10-CM | POA: Diagnosis not present

## 2016-11-09 MED ORDER — CYANOCOBALAMIN 1000 MCG/ML IJ SOLN
1000.0000 ug | Freq: Once | INTRAMUSCULAR | Status: AC
  Administered 2016-11-09: 1000 ug via INTRAMUSCULAR

## 2016-11-09 NOTE — Progress Notes (Signed)
Patient had her B12 injection for her Vit B12 deficiency on 11/09/2016 at 11.50am. Given by Rosealee Albee CMA.  I have reviewed and agree with note, evaluation, plan.   Garret Reddish, MD

## 2016-11-10 ENCOUNTER — Other Ambulatory Visit: Payer: Self-pay | Admitting: Family Medicine

## 2016-11-13 ENCOUNTER — Ambulatory Visit (INDEPENDENT_AMBULATORY_CARE_PROVIDER_SITE_OTHER): Payer: Medicare Other | Admitting: Neurology

## 2016-11-13 ENCOUNTER — Encounter: Payer: Self-pay | Admitting: Neurology

## 2016-11-13 ENCOUNTER — Other Ambulatory Visit: Payer: Medicare Other

## 2016-11-13 VITALS — BP 102/60 | HR 64 | Ht 64.0 in | Wt 167.0 lb

## 2016-11-13 DIAGNOSIS — R202 Paresthesia of skin: Secondary | ICD-10-CM | POA: Diagnosis not present

## 2016-11-13 DIAGNOSIS — E538 Deficiency of other specified B group vitamins: Secondary | ICD-10-CM | POA: Diagnosis not present

## 2016-11-13 DIAGNOSIS — G63 Polyneuropathy in diseases classified elsewhere: Secondary | ICD-10-CM

## 2016-11-13 DIAGNOSIS — D510 Vitamin B12 deficiency anemia due to intrinsic factor deficiency: Secondary | ICD-10-CM | POA: Diagnosis not present

## 2016-11-13 MED ORDER — GABAPENTIN 300 MG PO CAPS
300.0000 mg | ORAL_CAPSULE | Freq: Every day | ORAL | 3 refills | Status: DC
Start: 2016-11-13 — End: 2016-12-23

## 2016-11-13 NOTE — Progress Notes (Addendum)
[facility] Clinic Note - Initial Visit   Date: 11/13/16  Erin Kirk MRN: [id number] DOB: Feb 11, 1956   Dear Dr. Yong Channel:  Thank you for your kind referral of Erin Kirk for consultation of hand numbness. Although her history is well known to you, please allow [place] to reiterate it for the purpose of our medical record. The patient was accompanied to the clinic by self.    History of Present Illness: Erin Kirk is a 61 y.o. right-handed Caucasian female with hypercoagulable state with history of PE on coumadin, hypertension, history of melanoma involving cervical lymph nodes (2005), hypothyroidism, diabetes mellitus (5.2) and newly diagnosed vitamin B12 deficiency presenting for evaluation of paresthesias of the hands and feet.    She was taking metformin in the fall 2017 and developed diarrhea and two weeks later, she developed numbness of the fingers and hands bilaterally.  She was able to move her fingers, but felt as if her entire hand was swollen and had no sensation.  She stopped driving because she was unable to feet the steering wheel.  She also complains of numbness and tingling of the feet.  She was found to have vitamin B12 deficiency and has been taking weekly injections.  Her supplementation has helped because her pain is not as intense and she has enough sensation where she is able to drive.  She reports having cold hands and throbbing pain of her arms. Sometimes, her hands get swollen and purple discoloration. She denies any imbalance or falls.  She endorses some weakness of the hands with grip.   She is not vegan or vegetarian.   Out-side paper records, electronic medical record, and images have been reviewed where available and summarized as:  Labs 08/28/2016:  Vitamin B12 109**  Lab Results  Component Value Date   VITAMINB12 676 09/28/2016   Lab Results  Component Value Date   TSH 0.68 08/28/2016   Lab Results  Component Value Date    HGBA1C 5.2 05/14/2016    Past Medical History:  Diagnosis Date  . Allergy   . Anxiety   . Arthritis   . Asthma   . Blood transfusion without reported diagnosis   . Cancer ([place])     melanoma  . Cataract   . Clotting disorder ([place])   . Depression   . Diabetes ([place])   . GERD (gastroesophageal reflux disease)   . Hypertension   . Irregular heart beat   . Lupus anticoagulant with hypercoagulable state ([place])   . Primary hypercoagulable state ([place])   . Pulmonary embolism ([place])   . Thyroid disease    hypothyroidism    Past Surgical History:  Procedure Laterality Date  . ABDOMINAL HYSTERECTOMY     noncancerous  . BACK SURGERY  1983  . CHOLECYSTECTOMY    . COLONOSCOPY    . fatty tumor breast     removed  . KNEE ARTHROSCOPY    . melanoma removal     with right neck lymph nodes  . OOPHORECTOMY    . removal lymphnodes in right neck    . UPPER GASTROINTESTINAL ENDOSCOPY       Medications:  Outpatient Encounter Prescriptions as of 11/13/2016  Medication Sig  . acetaminophen (TYLENOL) 325 MG tablet Take 650 mg by mouth every 6 (six) hours as needed for mild pain or fever. Reported on 01/13/2016  . diltiazem (CARDIZEM CD) 360 MG 24 hr capsule TAKE ONE CAPSULE BY MOUTH ONCE DAILY  . folic acid (  FOLVITE) 1 MG tablet TAKE ONE TABLET BY MOUTH ONCE DAILY  . levothyroxine (SYNTHROID, LEVOTHROID) 100 MCG tablet TAKE ONE TABLET BY MOUTH ONCE DAILY  . metoprolol succinate (TOPROL-XL) 25 MG 24 hr tablet TAKE ONE TABLET BY MOUTH ONCE DAILY  . ranitidine (ZANTAC) 150 MG tablet Take 1 tablet (150 mg total) by mouth 2 (two) times daily.  Marland Kitchen triamterene-hydrochlorothiazide (MAXZIDE-25) 37.5-25 MG tablet TAKE ONE TABLET BY MOUTH ONCE DAILY.  . valsartan (DIOVAN) 160 MG tablet Take 1 tablet (160 mg total) by mouth daily.  . Vitamin D, Ergocalciferol, (DRISDOL) 50000 units CAPS capsule TAKE 1 CAPSULE BY MOUTH ONCE A WEEK  . warfarin (COUMADIN) 5 MG tablet TAKE BY MOUTH AS DIRECTED BY  ANTICOAGULATION CLINIC  . gabapentin (NEURONTIN) 300 MG capsule Take 1 capsule (300 mg total) by mouth at bedtime.  . [DISCONTINUED] BAYER CONTOUR TEST test strip USE ONE STRIP TO CHECK GLUCOSE ONCE DAILY  . [DISCONTINUED] metFORMIN (GLUCOPHAGE) 1000 MG tablet TAKE ONE TABLET BY MOUTH TWICE DAILY WITH A MEAL (Patient not taking: Reported on 09/07/2016)  . [DISCONTINUED] promethazine (PHENERGAN) 12.5 MG tablet Take 1 tablet (12.5 mg total) by mouth every 6 (six) hours as needed for nausea or vomiting.  . [DISCONTINUED] traMADol (ULTRAM) 50 MG tablet Take 1 tablet (50 mg total) by mouth every 8 (eight) hours as needed.   No facility-administered encounter medications on file as of 11/13/2016.      Allergies:  Allergies  Allergen Reactions  . Iohexol Anaphylaxis, Hives and Shortness Of Breath    Incident 1990, premedicated prior to obtaining IV contrast since.  . Amoxicillin-Pot Clavulanate Swelling and Other (See Comments)    Facial swelling  . Other     Contrast dye    Family History: Family History  Problem Relation Age of Onset  . Coronary artery disease Mother     Mom 22, Dad 57-smoker  . Diabetes Mother   . Hypertension Father   . Diabetes Father   . Mitral valve prolapse Sister   . Diabetes Sister   . Colon cancer Neg Hx   . Esophageal cancer Neg Hx   . Pancreatic cancer Neg Hx   . Rectal cancer Neg Hx   . Stomach cancer Neg Hx     Social History: Social History  Substance Use Topics  . Smoking status: Never Smoker  . Smokeless tobacco: Never Used  . Alcohol use No   Social History   Social History Narrative   Married 1971. 1 child Howard Patton (by Octavia Bruckner) lives in [place] with 2 grandchildren girls 8,6 in 2015.       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     Review of Systems:  CONSTITUTIONAL: No fevers, chills, night sweats, or weight loss.   EYES: No visual changes or eye  pain ENT: No hearing changes.  No history of nose bleeds.   RESPIRATORY: No cough, wheezing and shortness of breath.   CARDIOVASCULAR: Negative for chest pain, and palpitations.   GI: Negative for abdominal discomfort, blood in stools or black stools.  No recent change in bowel habits.   GU:  No history of incontinence.   MUSCLOSKELETAL: No history of joint pain or swelling.  No myalgias.   SKIN: Negative for lesions, rash, and itching.   HEMATOLOGY/ONCOLOGY: Negative for prolonged bleeding, bruising easily, and swollen nodes.  No history of cancer.   ENDOCRINE: Negative for cold or  heat intolerance, polydipsia or goiter.   PSYCH:  No depression or anxiety symptoms.   NEURO: As Above.   Vital Signs:  BP 102/60   Pulse 64   Ht '5\' 4"'$  (1.626 m)   Wt 167 lb (75.8 kg)   BMI 28.67 kg/m    General Medical Exam:   General:  Well appearing, comfortable.   Eyes/ENT: see cranial nerve examination.   Neck: No masses appreciated.  Full range of motion without tenderness.  No carotid bruits. Respiratory:  Clear to auscultation, good air entry bilaterally.   Cardiac:  Regular rate and rhythm, no murmur.   Extremities:  No deformities, edema, or skin discoloration.  Skin:  No rashes or lesions.  Neurological Exam: MENTAL STATUS including orientation to time, place, person, recent and remote memory, attention span and concentration, language, and fund of knowledge is normal.  Speech is not dysarthric.  CRANIAL NERVES: II:  No visual field defects.  Unremarkable fundi.   III-IV-VI: Pupils equal round and reactive to light.  Normal conjugate, extra-ocular eye movements in all directions of gaze.  No nystagmus.  No ptosis.   V:  Normal facial sensation.     VII:  Normal facial symmetry and movements.  No pathologic facial reflexes.  VIII:  Normal hearing and vestibular function.   IX-X:  Normal palatal movement.   XI:  Normal shoulder shrug and head rotation.   XII:  Normal tongue strength and  range of motion, no deviation or fasciculation.  MOTOR:  No atrophy, fasciculations or abnormal movements.  No pronator drift.  Tone is normal.    Right Upper Extremity:    Left Upper Extremity:    Deltoid  5/5   Deltoid  5/5   Biceps  5/5   Biceps  5/5   Triceps  5/5   Triceps  5/5   Wrist extensors  5/5   Wrist extensors  5/5   Wrist flexors  5/5   Wrist flexors  5/5   Finger extensors  5/5   Finger extensors  5/5   Finger flexors  5/5   Finger flexors  5/5   Dorsal interossei  5/5   Dorsal interossei  5/5   Abductor pollicis  5/5   Abductor pollicis  5/5   Tone (Ashworth scale)  0  Tone (Ashworth scale)  0   Right Lower Extremity:    Left Lower Extremity:    Hip flexors  5/5   Hip flexors  5/5   Hip extensors  5/5   Hip extensors  5/5   Knee flexors  5/5   Knee flexors  5/5   Knee extensors  5/5   Knee extensors  5/5   Dorsiflexors  5/5   Dorsiflexors  5/5   Plantarflexors  5/5   Plantarflexors  5/5   Toe extensors  5/5   Toe extensors  5/5   Toe flexors  5/5   Toe flexors  5/5   Tone (Ashworth scale)  0  Tone (Ashworth scale)  0   MSRs:  Right                                                                 Left brachioradialis 2+  brachioradialis 2+  biceps 2+  biceps 2+  triceps 2+  triceps 2+  patellar 2+  patellar 2+  ankle jerk 1+  ankle jerk 1+  Hoffman no  Hoffman no  plantar response down  plantar response down   SENSORY:  Pin prick, temperature, and light touch is reduced in the palm/fingers/feet bilaterally.  Sensation is normal in the forearm and lower legs.  Romberg's sign absent.   COORDINATION/GAIT: Normal finger-to- nose-finger.  Intact rapid alternating movements bilaterally.  Gait narrow based and stable. Tandem and stressed gait intact.    IMPRESSION: Mrs. Melott is a 61 year-old female referred for evaluation of bilateral hand and feet paresthesias in the setting of vitamin B12 deficiency.  Her improvement since starting B12 supplementation has been  very slow and mild.  She continues to have painful paresthesias of the hands > feet.  Her exam shows sensory loss involving a glove-stocking distributing, worse in the hands, with preserved strength.  Reflexes are mildly attenuated at the ankles only.  Suprisingly, there is no sensory ataxia.  She will have NCS/EMG of the left arm and leg to evaluate for polyradiculoneuropathy, given her non-length dependent symptoms.  She will also have laboratory testing for other nutritional deficiencies and secondary causes that can cause neuropathy.  I reassured patient that if her symptoms are solely due to vitamin B12 deficiency, improvement is gradual and she should continue to appreciate less pain and paresthesias.  She also has macrocytic anemia with MCV 106 > 102, suggesting that her B12 supplementation is helping, but still room for improvement.  Her last vitamin B12 level was normal, so it is reasonable to taper her to monthly injections now.   I am not sure why she is having throbbing arm pain, swelling, or discoloration of her hands as this is not associated with neuropathy.  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  Radial pulses were strong on my exam.    PLAN/RECOMMENDATIONS:  1.  Check ESR, vitamin B1, vitamin B6, MMA, copper, ceruloplasmin, heavy metal screen, folate 2.  NCS/EMG of the left arm and leg 3.  Start gabapentin '300mg'$  at bedtime 4.  OK to take vitamin B12 1035mg injections monthly  Return to clinic in 3 months.   The duration of this appointment visit was 60 minutes of face-to-face time with the patient.  Greater than 50% of this time was spent in counseling, explanation of diagnosis, planning of further management, and coordination of care.   Thank you for allowing me to participate in patient's care.  If I can answer any additional questions, I would be pleased to do so.    Sincerely,    Lamon Rotundo K. PPosey Pronto DO

## 2016-11-13 NOTE — Patient Instructions (Addendum)
1.  Check blood work. Your provider has requested that you have labwork completed today. Please go to [facility] ([id number]) on the second floor of this building before leaving the office today. You do not need to check in. If you are not called within 15 minutes please check with the front desk.  2.  NCS/EMG of the left arm and leg. ELECTROMYOGRAM AND NERVE CONDUCTION STUDIES (EMG/NCS) INSTRUCTIONS  How to Prepare The neurologist conducting the EMG will need to know if you have certain medical conditions. Tell the neurologist and other EMG lab personnel if you: . Have a pacemaker or any other electrical medical device . Take blood-thinning medications . Have hemophilia, a blood-clotting disorder that causes prolonged bleeding Bathing Take a shower or bath shortly before your exam in order to remove oils from your skin. Don't apply lotions or creams before the exam.  What to Expect You'll likely be asked to change into a hospital gown for the procedure and lie down on an examination table. The following explanations can help you understand what will happen during the exam.  . Electrodes. The neurologist or a technician places surface electrodes at various locations on your skin depending on where you're experiencing symptoms. Or the neurologist may insert needle electrodes at different sites depending on your symptoms.  . Sensations. The electrodes will at times transmit a tiny electrical current that you may feel as a twinge or spasm. The needle electrode may cause discomfort or pain that usually ends shortly after the needle is removed. If you are concerned about discomfort or pain, you may want to talk to the neurologist about taking a short break during the exam.  . Instructions. During the needle EMG, the neurologist will assess whether there is any spontaneous electrical activity when the muscle is at rest - activity that isn't present in healthy muscle tissue - and the degree of  activity when you slightly contract the muscle.  He or she will give you instructions on resting and contracting a muscle at appropriate times. Depending on what muscles and nerves the neurologist is examining, he or she may ask you to change positions during the exam.  After your EMG You may experience some temporary, minor bruising where the needle electrode was inserted into your muscle. This bruising should fade within several days. If it persists, contact your primary care doctor.   3.  Start gabapentin 300mg  at bedtime  4.  OK to take vitamin B12 1077mcg daily  Return to clinic in 3 months

## 2016-11-14 LAB — SEDIMENTATION RATE: SED RATE: 5 mm/h (ref 0–30)

## 2016-11-14 LAB — FOLATE: FOLATE: 23.4 ng/mL (ref >5.4)

## 2016-11-15 LAB — COPPER, SERUM: Copper: 97 ug/dL (ref 70–175)

## 2016-11-16 ENCOUNTER — Ambulatory Visit (INDEPENDENT_AMBULATORY_CARE_PROVIDER_SITE_OTHER): Payer: Medicare Other

## 2016-11-16 DIAGNOSIS — E538 Deficiency of other specified B group vitamins: Secondary | ICD-10-CM

## 2016-11-16 DIAGNOSIS — G63 Polyneuropathy in diseases classified elsewhere: Secondary | ICD-10-CM | POA: Diagnosis not present

## 2016-11-16 DIAGNOSIS — R202 Paresthesia of skin: Secondary | ICD-10-CM | POA: Diagnosis not present

## 2016-11-16 LAB — CERULOPLASMIN: Ceruloplasmin: 25 mg/dL (ref 18–53)

## 2016-11-16 MED ORDER — CYANOCOBALAMIN 1000 MCG/ML IJ SOLN
1000.0000 ug | Freq: Once | INTRAMUSCULAR | Status: AC
  Administered 2016-11-16: 1000 ug via INTRAMUSCULAR

## 2016-11-16 NOTE — Progress Notes (Signed)
Changing to monthly b12 at this point. b12 given for history low b12 Lab Results  Component Value Date   VITAMINB12 676 09/28/2016  though had normalized last visit  Erin Kirk

## 2016-11-17 LAB — METHYLMALONIC ACID, SERUM: METHYLMALONIC ACID, QUANT: 223 nmol/L (ref 87–318)

## 2016-11-17 LAB — VITAMIN B1: VITAMIN B1 (THIAMINE): 14 nmol/L (ref 8–30)

## 2016-11-18 LAB — HEAVY METALS PANEL, BLOOD: Mercury, B: <4 mcg/L (ref <=10)

## 2016-11-19 ENCOUNTER — Ambulatory Visit (INDEPENDENT_AMBULATORY_CARE_PROVIDER_SITE_OTHER): Payer: Medicare Other | Admitting: Neurology

## 2016-11-19 ENCOUNTER — Telehealth: Payer: Self-pay | Admitting: Neurology

## 2016-11-19 DIAGNOSIS — R202 Paresthesia of skin: Secondary | ICD-10-CM

## 2016-11-19 DIAGNOSIS — G63 Polyneuropathy in diseases classified elsewhere: Secondary | ICD-10-CM

## 2016-11-19 DIAGNOSIS — E538 Deficiency of other specified B group vitamins: Secondary | ICD-10-CM

## 2016-11-19 NOTE — Telephone Encounter (Signed)
Patient informed that NCS/EMG is normal and does not show evidence of neuropathy.  Symptoms most likely due to B12 deficiency.  We are still awaiting her laboratory results.  Donika K. Posey Pronto, DO

## 2016-11-19 NOTE — Procedures (Signed)
[facility]  [place], [place]  [place], [place] [address] Tel: [phone] Fax:  [phone] Test Date:  11/19/2016  Patient: Erin Kirk DOB: 02-20-1956 Physician: Narda Amber, DO  Sex: Female Height: 5'4 " Ref Phys: Narda Amber, DO  ID#: [id number]   Technician: Jerilynn Mages. Dean   Patient Complaints: This is a 61 year old female referred for evaluation of bilateral hand > feet numbness and tingling in the setting of vitamin B 12 deficiency.   NCV & EMG Findings: Extensive electrodiagnostic testing of the left upper and lower extremity shows:  1. All sensory responses including the left median, ulnar, radial, mixed palmer, sural, and superficial peroneal nerves.  2. The left peroneal motor response shows reduced amplitude at the extensor digitorum brevis, and normal amplitude at the tibialis anterior. The left median, ulnar, and tibial motor responses are within normal limits.  3. Bilateral tibial H reflex studies and left tibial F wave is within normal limits.  4. There is no evidence of active or primary motor axon loss changes affecting any of the tested muscles. Motor unit configuration and recruitment pattern is within normal limits. Needle electrode examination was not performed on deep muscles as the patient is on anticoagulation therapy.   Impression: This is a normal study.  In particular, there is no evidence of a generalized sensorimotor polyneuropathy, cervical/lumbosacral radiculopathy, or carpal tunnel syndrome affecting the left side.   ___________________________ Narda Amber, DO    Nerve Conduction Studies Anti Sensory Summary Table   Site NR Peak (ms) Norm Peak (ms) P-T Amp (V) Norm P-T Amp  Left Median Anti Sensory (2nd Digit)  32.3C  Wrist    3.1 <3.8 51.4 >10  Left Radial Anti Sensory (Base 1st Digit)  32.3C  Wrist    2.5 <2.8 23.2 >10  Left Sup Peroneal Anti Sensory (Ant Lat Mall)  32.3C  12 cm    3.6 <4.6 4.8 >3  Left Sural Anti  Sensory (Lat Mall)  32.3C  Calf    3.3 <4.6 8.5 >3  Left Ulnar Anti Sensory (5th Digit)  32.3C  Wrist    2.8 <3.2 19.9 >5   Motor Summary Table   Site NR Onset (ms) Norm Onset (ms) O-P Amp (mV) Norm O-P Amp Site1 Site2 Delta-0 (ms) Dist (cm) Vel (m/s) Norm Vel (m/s)  Left Median Motor (Abd Poll Brev)  32.3C  Wrist    3.0 <4.0 9.8 >5 Elbow Wrist 3.5 20.0 57 >50  Elbow    6.5  9.7         Left Peroneal Motor (Ext Dig Brev)  32.3C  Ankle    4.6 <6.0 1.8 >2.5 B Fib Ankle 7.7 32.0 42 >40  B Fib    12.3  1.0  Poplt B Fib 1.7 10.0 59 >40  Poplt    14.0  1.1         Left Peroneal TA Motor (Tib Ant)  32.3C  Fib Head    3.4 <4.5 4.8 >3 Poplit Fib Head 1.2 10.0 83 >40  Poplit    4.6  4.5         Left Tibial Motor (Abd Hall Brev)  32.3C  Ankle    3.3 <6.0 7.2 >4 Knee Ankle 8.5 38.0 45 >40  Knee    11.8  4.9         Left Ulnar Motor (Abd Dig Minimi)  32.3C  Wrist    2.6 <3.1 8.3 >7 B Elbow Wrist 2.8 16.0 57 >50  B Elbow    5.4  7.8  A Elbow B Elbow 1.6 10.0 63 >50  A Elbow    7.0  7.6          Comparison Summary Table   Site NR Peak (ms) Norm Peak (ms) P-T Amp (V) Site1 Site2 Delta-P (ms) Norm Delta (ms)  Left Median/Ulnar Palm Comparison (Wrist - 8cm)  32.3C  Median Palm    1.9 <2.2 36.5 Median Palm Ulnar Palm 0.0   Ulnar Palm    1.9 <2.2 33.4       F Wave Studies   NR F-Lat (ms) Lat Norm (ms) L-R F-Lat (ms) M-Lat (ms) FLat-MLat (ms)  Left Tibial (Mrkrs) (Abd Hallucis)  32.3C     47.20 <55  3.74 43.46   H Reflex Studies   NR H-Lat (ms) Lat Norm (ms) L-R H-Lat (ms) M-Lat (ms) HLat-MLat (ms)  Left Tibial (Gastroc)  32.3C     34.69 <35 0.81 4.35 30.34  Right Tibial (Gastroc)  32.3C     33.88 <35 0.81 4.35 29.53   EMG   Side Muscle Ins Act Fibs Psw Fasc Number Recrt Dur Dur. Amp Amp. Poly Poly. Comment  Left 1stDorInt Nml Nml Nml Nml Nml Nml Nml Nml Nml Nml Nml Nml N/A  Left AntTibialis Nml Nml Nml Nml Nml Nml Nml Nml Nml Nml Nml Nml N/A  Left Gastroc Nml Nml Nml Nml  Nml Nml Nml Nml Nml Nml Nml Nml N/A  Left RectFemoris Nml Nml Nml Nml Nml Nml Nml Nml Nml Nml Nml Nml N/A  Left Ext Indicis Nml Nml Nml Nml Nml Nml Nml Nml Nml Nml Nml Nml N/A  Left PronatorTeres Nml Nml Nml Nml Nml Nml Nml Nml Nml Nml Nml Nml N/A  Left Biceps Nml Nml Nml Nml Nml Nml Nml Nml Nml Nml Nml Nml N/A  Left Triceps Nml Nml Nml Nml Nml Nml Nml Nml Nml Nml Nml Nml N/A  Left Deltoid Nml Nml Nml Nml Nml Nml Nml Nml Nml Nml Nml Nml N/A  Left ABD Dig Min Nml Nml Nml Nml Nml Nml Nml Nml Nml Nml Nml Nml N/A      Waveforms:

## 2016-11-23 ENCOUNTER — Ambulatory Visit: Payer: Medicare Other | Admitting: General Practice

## 2016-11-23 ENCOUNTER — Telehealth: Payer: Self-pay | Admitting: *Deleted

## 2016-11-23 DIAGNOSIS — D6859 Other primary thrombophilia: Secondary | ICD-10-CM

## 2016-11-23 DIAGNOSIS — Z5181 Encounter for therapeutic drug level monitoring: Secondary | ICD-10-CM

## 2016-11-23 LAB — VITAMIN B6: Vitamin B6: 11.7 ng/mL (ref 2.1–21.7)

## 2016-11-23 LAB — POCT INR: INR: 2.5

## 2016-11-23 NOTE — Telephone Encounter (Signed)
-----   Message from Alda Berthold, DO sent at 11/23/2016  8:11 AM EST ----- Please notify patient lab are within normal limits.  Thank you.

## 2016-11-23 NOTE — Telephone Encounter (Signed)
Left message giving patient her results.

## 2016-11-26 ENCOUNTER — Other Ambulatory Visit: Payer: Self-pay | Admitting: Family Medicine

## 2016-12-14 ENCOUNTER — Ambulatory Visit (INDEPENDENT_AMBULATORY_CARE_PROVIDER_SITE_OTHER): Payer: Medicare Other

## 2016-12-14 DIAGNOSIS — E538 Deficiency of other specified B group vitamins: Secondary | ICD-10-CM | POA: Diagnosis not present

## 2016-12-14 MED ORDER — CYANOCOBALAMIN 1000 MCG/ML IJ SOLN
1000.0000 ug | Freq: Once | INTRAMUSCULAR | Status: AC
  Administered 2016-12-14: 1000 ug via INTRAMUSCULAR

## 2016-12-21 ENCOUNTER — Ambulatory Visit (INDEPENDENT_AMBULATORY_CARE_PROVIDER_SITE_OTHER): Payer: Medicare Other | Admitting: General Practice

## 2016-12-21 DIAGNOSIS — D6859 Other primary thrombophilia: Secondary | ICD-10-CM

## 2016-12-21 DIAGNOSIS — Z5181 Encounter for therapeutic drug level monitoring: Secondary | ICD-10-CM | POA: Diagnosis not present

## 2016-12-21 LAB — POCT INR: INR: 2.3

## 2016-12-21 NOTE — Patient Instructions (Signed)
Pre visit review using our clinic review tool, if applicable. No additional management support is needed unless otherwise documented below in the visit note. 

## 2016-12-21 NOTE — Progress Notes (Signed)
I have reviewed and agree with note, evaluation, plan.   Armoni Depass, MD  

## 2016-12-23 ENCOUNTER — Ambulatory Visit (INDEPENDENT_AMBULATORY_CARE_PROVIDER_SITE_OTHER): Payer: Medicare Other | Admitting: Family Medicine

## 2016-12-23 ENCOUNTER — Telehealth: Payer: Self-pay

## 2016-12-23 ENCOUNTER — Other Ambulatory Visit: Payer: Self-pay | Admitting: Family Medicine

## 2016-12-23 ENCOUNTER — Encounter: Payer: Self-pay | Admitting: Family Medicine

## 2016-12-23 VITALS — BP 120/80 | HR 74 | Temp 98.0°F | Ht 64.0 in | Wt 175.6 lb

## 2016-12-23 DIAGNOSIS — E538 Deficiency of other specified B group vitamins: Secondary | ICD-10-CM

## 2016-12-23 DIAGNOSIS — D6859 Other primary thrombophilia: Secondary | ICD-10-CM | POA: Diagnosis not present

## 2016-12-23 DIAGNOSIS — I480 Paroxysmal atrial fibrillation: Secondary | ICD-10-CM

## 2016-12-23 DIAGNOSIS — E559 Vitamin D deficiency, unspecified: Secondary | ICD-10-CM

## 2016-12-23 DIAGNOSIS — E039 Hypothyroidism, unspecified: Secondary | ICD-10-CM | POA: Diagnosis not present

## 2016-12-23 DIAGNOSIS — Z7289 Other problems related to lifestyle: Secondary | ICD-10-CM | POA: Diagnosis not present

## 2016-12-23 DIAGNOSIS — I10 Essential (primary) hypertension: Secondary | ICD-10-CM

## 2016-12-23 DIAGNOSIS — E119 Type 2 diabetes mellitus without complications: Secondary | ICD-10-CM | POA: Diagnosis not present

## 2016-12-23 DIAGNOSIS — Z0000 Encounter for general adult medical examination without abnormal findings: Secondary | ICD-10-CM

## 2016-12-23 LAB — CBC
HCT: 32.8 % — ABNORMAL LOW (ref 36.0–46.0)
HEMOGLOBIN: 11 g/dL — AB (ref 12.0–15.0)
MCHC: 33.4 g/dL (ref 30.0–36.0)
MCV: 80.9 fl (ref 78.0–100.0)
PLATELETS: 232 10*3/uL (ref 150.0–400.0)
RBC: 4.06 Mil/uL (ref 3.87–5.11)
RDW: 13.9 % (ref 11.5–15.5)
WBC: 4.4 10*3/uL (ref 4.0–10.5)

## 2016-12-23 LAB — COMPREHENSIVE METABOLIC PANEL
ALK PHOS: 60 U/L (ref 39–117)
ALT: 23 U/L (ref 0–35)
AST: 22 U/L (ref 0–37)
Albumin: 4.4 g/dL (ref 3.5–5.2)
BILIRUBIN TOTAL: 0.6 mg/dL (ref 0.2–1.2)
BUN: 15 mg/dL (ref 6–23)
CALCIUM: 9.3 mg/dL (ref 8.4–10.5)
CO2: 31 meq/L (ref 19–32)
CREATININE: 1.13 mg/dL (ref 0.40–1.20)
Chloride: 103 mEq/L (ref 96–112)
GFR: 51.71 mL/min — AB (ref >60.00)
Glucose, Bld: 95 mg/dL (ref 70–99)
Potassium: 4.1 mEq/L (ref 3.5–5.1)
Sodium: 140 mEq/L (ref 135–145)
TOTAL PROTEIN: 7 g/dL (ref 6.0–8.3)

## 2016-12-23 LAB — VITAMIN D 25 HYDROXY (VIT D DEFICIENCY, FRACTURES): VITD: 65.73 ng/mL (ref 30.00–100.00)

## 2016-12-23 LAB — VITAMIN B12: VITAMIN B 12: 835 pg/mL (ref 211–911)

## 2016-12-23 LAB — LDL CHOLESTEROL, DIRECT: Direct LDL: 37 mg/dL

## 2016-12-23 LAB — HEMOGLOBIN A1C: Hgb A1c MFr Bld: 6.2 % (ref 4.6–6.5)

## 2016-12-23 LAB — TSH: TSH: 2.41 u[IU]/mL (ref 0.35–4.50)

## 2016-12-23 NOTE — Progress Notes (Signed)
Subjective:   Erin Kirk is a 61 y.o. female who presents for Medicare Annual (Subsequent) preventive examination.  The Patient was informed that the wellness visit is to identify future health risk and educate and initiate measures that can reduce risk for increased disease through the lifespan.    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 Good   Preventive Screening -Counseling & Management   Smoking history no Second Hand Smoke status; No Smokers in the home Spouse smoked but quit  2009  ETOH no  Medication adherence or issues?  No issues   RISK FACTORS Diet BMI 30; goal to lose weight  Off diabetic meds and trying to eat 3 meals a day until this last episode of b12 deficiency Now up to 175 here in the office but weighs less at home Plans to monitor diet and lose weight  Uses sweetener in tea; Plans to go back to the water  Breakfast never ate breakfast; will try cereal bar Cereal on occasion;  Lunch; sandwich and summer time tomato sandwiches Supper sometimes she cooks   Regular exercise  Plans Start riding your bike 10 minutes and working up to 40 States she has been tired but Vit B12 helping    Cardiac Risk Factors:  Advanced aged  >60 in women Hyperlipidemia will check today  Diabetes- rechecking today  Family History mother had HD and father  Obesity neg but BMI 30 and is going to lose weight  Fall risk not at this time  Mobility of Functional changes this year? During illness    Mental Health:  Any emotional problems? Anxious, depressed, irritable, sad or blue? States she is at times Denies feeling depressed or hopeless; voices pleasure in daily life How many social activities have you been engaged in within the last 2 weeks? No she is not "hopeless"  Describes some family stresses today but declines counseling or apt to be evaluated for medication. States she can deal with these  issues She is continuing to care for her home; keeps her grandchildren in the evening and is helping her sister who moved near after the death of her spouse.  Mood normal today; no crying,  but did admit that she lost a niece whom she was very close to in 2016. Has a fup apt with Dr. Yong Channel in approx 4 months and agrees to address it with him at that time if she is not better.  Phq 7 but very vague about her symptoms and or degree of depression and PHQ of 7 may overstate her actual depression.   Hearing Screening Comments:  No hearing loss Vision Screening Comments: Due in May [facility] Dr. Oswaldo Conroy   No diabetic retinopathy    Activities of Daily Living - See functional screen   Cognitive testing; Ad8 score; 0 or less than 2  MMSE deferred or completed if AD8 + 2 issues  Advanced Directive; Reviewed advanced directive and agreed to receipt of information and discussion.  Focused face to face x  20 minutes discussing HCPOA and Living will and reviewed all the questions in the [place] forms. The patient voices understanding of HCPOA; LW reviewed and information provided on each question. Educated on how to revoke this HCPOA or LW at any time.   Also  discussed life prolonging measures (given a few examples) and where she could choose to initiate or not;  the ability  to given the HCPOA power to change her living will or not if she cannot speak for herself; as well as finalizing the will by 2 unrelated witnesses and notary.  Will call for questions and given information on [facility] pastoral department for further assistance.    Patient Care Team: Marin Olp, MD as PCP - General (Family Medicine)   Immunization History  Administered Date(s) Administered  . Influenza Split 06/30/2011, 06/29/2012  . Influenza Whole 07/26/2007, 06/15/2008, 07/25/2009  . Influenza,inj,Quad PF,36+ Mos 07/10/2013, 06/13/2014, 06/14/2015, 05/14/2016  . Pneumococcal Polysaccharide-23  07/26/2014  . Td 09/22/2003   Required Immunizations needed today NONE Educated regarding the shingrix   Screening test up to date or reviewed for plan of completion Health Maintenance Due  Topic Date Due  . Hepatitis C Screening  20-May-1956  . TETANUS/TDAP  09/21/2013  . PAP SMEAR  07/25/2016  . FOOT EXAM  10/14/2016  . HEMOGLOBIN A1C  11/14/2016  . OPHTHALMOLOGY EXAM  12/02/2016     Medicare now request all "baby boomers" test for possible exposure to Hepatitis C. Many may have been exposed due to dental work, tatoo's, vaccinations when young. The Hepatitis C virus is dormant for many years and then sometimes will cause liver cancer. If you gave blood in the past 15 years, you were most likely checked for Hep C. If you rec'd blood; you may want to consider testing or if you are high risk for any other reason.   Pap checked by dr. Ronita Hipps at [facility];  Sees him once a year; may 2017  GYN follows mammogram   Eye exam; 12/2015 due now;  Will wait until May   Cardiac Risk Factors include: advanced age (>88men, >43 women);diabetes mellitus;dyslipidemia;family history of premature cardiovascular disease;hypertension     Objective:     Vitals: BP 120/80 (BP Location: Left Arm, Patient Position: Sitting, Cuff Size: Normal)   Pulse 74   Temp 98 F (36.7 C) (Oral)   Ht 5\' 4"  (1.626 m)   Wt 175 lb 9.6 oz (79.7 kg)   SpO2 97%   BMI 30.14 kg/m   Body mass index is 30.14 kg/m.   Tobacco History  Smoking Status  . Never Smoker  Smokeless Tobacco  . Never Used     Counseling given: Yes   Past Medical History:  Diagnosis Date  . Allergy   . Anxiety   . Arthritis   . Asthma   . Blood transfusion without reported diagnosis   . Cancer ([place])     melanoma  . Cataract   . Clotting disorder ([place])   . Depression   . Diabetes ([place])   . GERD (gastroesophageal reflux disease)   . Hypertension   . Irregular heart beat   . Lupus anticoagulant with hypercoagulable state  ([place])   . Primary hypercoagulable state ([place])   . Pulmonary embolism ([place])   . Thyroid disease    hypothyroidism   Past Surgical History:  Procedure Laterality Date  . ABDOMINAL HYSTERECTOMY     noncancerous  . BACK SURGERY  1983  . CHOLECYSTECTOMY    . COLONOSCOPY    . fatty tumor breast     removed  . KNEE ARTHROSCOPY    . melanoma removal     with right neck lymph nodes  . OOPHORECTOMY    . removal lymphnodes in right neck    . UPPER GASTROINTESTINAL ENDOSCOPY     Family History  Problem Relation Age of Onset  . Coronary artery  disease Mother     Mom 48, Dad 57-smoker  . Diabetes Mother   . Hypertension Father   . Diabetes Father   . Mitral valve prolapse Sister   . Diabetes Sister   . Colon cancer Neg Hx   . Esophageal cancer Neg Hx   . Pancreatic cancer Neg Hx   . Rectal cancer Neg Hx   . Stomach cancer Neg Hx    History  Sexual Activity  . Sexual activity: Yes    Outpatient Encounter Prescriptions as of 12/23/2016  Medication Sig  . acetaminophen (TYLENOL) 325 MG tablet Take 650 mg by mouth every 6 (six) hours as needed for mild pain or fever. Reported on 01/13/2016  . diltiazem (CARDIZEM CD) 360 MG 24 hr capsule TAKE ONE CAPSULE BY MOUTH ONCE DAILY  . levothyroxine (SYNTHROID, LEVOTHROID) 100 MCG tablet TAKE ONE TABLET BY MOUTH ONCE DAILY  . metoprolol succinate (TOPROL-XL) 25 MG 24 hr tablet TAKE ONE TABLET BY MOUTH ONCE DAILY  . ranitidine (ZANTAC) 150 MG tablet Take 1 tablet (150 mg total) by mouth 2 (two) times daily.  Marland Kitchen triamterene-hydrochlorothiazide (MAXZIDE-25) 37.5-25 MG tablet TAKE ONE TABLET BY MOUTH ONCE DAILY.  . valsartan (DIOVAN) 160 MG tablet Take 1 tablet (160 mg total) by mouth daily.  . Vitamin D, Ergocalciferol, (DRISDOL) 50000 units CAPS capsule TAKE 1 CAPSULE BY MOUTH ONCE A WEEK  . warfarin (COUMADIN) 5 MG tablet TAKE BY MOUTH AS DIRECTED BY ANTICOAGULATION CLINIC  . [DISCONTINUED] folic acid (FOLVITE) 1 MG tablet TAKE ONE TABLET BY  MOUTH ONCE DAILY  . [DISCONTINUED] gabapentin (NEURONTIN) 300 MG capsule Take 1 capsule (300 mg total) by mouth at bedtime.   No facility-administered encounter medications on file as of 12/23/2016.     Activities of Daily Living In your present state of health, do you have any difficulty performing the following activities: 12/23/2016  Hearing? N  Vision? N  Difficulty concentrating or making decisions? N  Walking or climbing stairs? N  Dressing or bathing? N  Doing errands, shopping? N  Preparing Food and eating ? N  Using the Toilet? N  Do you have problems with loss of bowel control? N  Managing your Medications? N  Managing your Finances? N  Housekeeping or managing your Housekeeping? N  Some recent data might be hidden    Patient Care Team: Marin Olp, MD as PCP - General (Family Medicine)    Assessment:     Exercise Activities and Dietary recommendations Current Exercise Habits: Home exercise routine, Time (Minutes): 30 (going to start riding her bike)  Goals    . Weight (lb) < 160 lb (72.6 kg)          Will drink water more; tea less Stop snacking;  Will plan to get back on bike         Fall Risk Fall Risk  12/23/2016 11/13/2016 12/08/2013  Falls in the past year? No No No   Depression Screen PHQ 2/9 Scores 12/23/2016 12/08/2013  PHQ - 2 Score 2 0  PHQ- 9 Score 7 -     Cognitive Function MMSE - Mini Mental State Exam 12/23/2016  Not completed: (No Data)        Immunization History  Administered Date(s) Administered  . Influenza Split 06/30/2011, 06/29/2012  . Influenza Whole 07/26/2007, 06/15/2008, 07/25/2009  . Influenza,inj,Quad PF,36+ Mos 07/10/2013, 06/13/2014, 06/14/2015, 05/14/2016  . Pneumococcal Polysaccharide-23 07/26/2014  . Td 09/22/2003   Screening Tests Health Maintenance  Topic Date Due  .  Hepatitis C Screening  [birth year]  . TETANUS/TDAP  09/21/2013  . PAP SMEAR  07/25/2016  . FOOT EXAM  10/14/2016  . HEMOGLOBIN A1C   11/14/2016  . OPHTHALMOLOGY EXAM  12/02/2016  . MAMMOGRAM  12/27/2016  . INFLUENZA VACCINE  04/21/2017  . PNEUMOCOCCAL POLYSACCHARIDE VACCINE (2) 07/27/2019  . COLONOSCOPY  04/06/2021  . HIV Screening  Completed      Plan:      PCP Notes  Health Maintenance Foot exam normal; no callus; cuts toenails. Demonstrated how she checks her feet; Warm and dry; Sensation normal; Pulses minimal but toenails blanch;   Pap completed by GYN in May or June GYN completed mammograms as well   Colonoscopy due 03/2021 To have Tdap if she sustains an injury  Educated regarding Shingrix and will decline for now.   Abnormal Screens   Referrals none  Patient concerns; Let the patient ventilate regarding current stresses which she feels she can manage; Depression sounds transitional but did agreed to discuss with Dr. Yong Channel if this continues or worsen by her next OV.   Nurse Concerns;  None   Next PCP apt states she will schedule in 4 months    During the course of the visit the patient was educated and counseled about the following appropriate screening and preventive services:   Vaccines to include Pneumoccal, Influenza, Hepatitis B, Td, Zostavax, HCV  Electrocardiogram  Cardiovascular Disease  Colorectal cancer screening  Bone density screening  Diabetes screening  Glaucoma screening  Mammography/PAP  Nutrition counseling   Patient Instructions (the written plan) was given to the patient.   WTUUE,KCMKL, RN  12/23/2016  I have reviewed and agree with note, evaluation, plan. I called the patient and she states that she has been down related to some family troubles and discouragement about her hands but it is improving as they have resolved the home situation and hands are improving. No Si. She agrees to see me if symptoms do not continue to slowly improve before her 4 month visit.   Garret Reddish, MD

## 2016-12-23 NOTE — Progress Notes (Signed)
Pre visit review using our clinic review tool, if applicable. No additional management support is needed unless otherwise documented below in the visit note. 

## 2016-12-23 NOTE — Assessment & Plan Note (Signed)
S: Lab Results  Component Value Date   TSH 0.68 08/28/2016   On thyroid medication-levothyroxine 122mcg A/P: update tsh

## 2016-12-23 NOTE — Telephone Encounter (Signed)
Call to be placed to Dr. Ronita Hipps office to confirm pap test date and result  Dr. Ronita Hipps [phone]

## 2016-12-23 NOTE — Assessment & Plan Note (Signed)
S: controlled on diltiazem 360mg  XR, metoprolol 25 mg XL, maxzide 25, valsartan 160mg .  A/P:Continue current meds:  Doing well

## 2016-12-23 NOTE — Assessment & Plan Note (Signed)
S: patient has been receiving B12 injections now on monthly basis Lab Results  Component Value Date   VITAMINB12 676 09/28/2016  last level was in normal range. She was having hand numbness and pain in both hands- thought deficiency could be related to metformin.   Pain and numbness/tingling about 75% better. At times fingers get bluish coloration and into hand A/P: continue replacement- update b12 today. Suspect in normal range and will have continued gradual improvement

## 2016-12-23 NOTE — Assessment & Plan Note (Signed)
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 Had recurrence in the past when working- permanently disabled as result- no issues. Lupus hypercoagulability syndrome. A/P: continue regular coumadin and pt/inr checks. Permanent disability- filled out paperwork today

## 2016-12-23 NOTE — Assessment & Plan Note (Signed)
S: rate controlled on diltiazem and metoprolol and anticoagulated with coumadin A/P: continue current rx

## 2016-12-23 NOTE — Patient Instructions (Addendum)
Please stop by lab before you go   Erin Kirk , Thank you for taking time to come for your Medicare Wellness Visit. I appreciate your ongoing commitment to your health goals. Please review the following plan we discussed and let me know if I can assist you in the future.   A Tetanus is recommended every 10 years. Medicare covers a tetanus if you have a cut or wound; otherwise, there may be a charge. If you had not had a tetanus with pertusses, known as the Tdap, you can take this anytime.   Medicare now request all "baby boomers" test for possible exposure to Hepatitis C. Many may have been exposed due to dental work, tatoo's, vaccinations when young. The Hepatitis C virus is dormant for many years and then sometimes will cause liver cancer. If you gave blood in the past 15 years, you were most likely checked for Hep C. If you rec'd blood; you may want to consider testing or if you are high risk for any other reason.    Shingrix is a vaccine for the prevention of herpes Zoster or Shingles in Adults 50 and older.  If you are on Medicare, you can request a prescription from your doctor to be filled at a pharmacy.  Please check with your benefits regarding applicable copays or out of pocket expenses.  The Shingrix is given in 2 vaccines approx 8 weeks apart. You must receive the 2nd ose prior to 6 months from receipt of the first.   These are the goals we discussed: Goals    . Weight (lb) < 160 lb (72.6 kg)          Will drink water more; tea less Stop snacking;  Will plan to get back on bike          This is a list of the screening recommended for you and due dates:  Health Maintenance  Topic Date Due  .  Hepatitis C: One time screening is recommended by Center for Disease Control  (CDC) for  adults born from [birth year] through 1967.   1955/09/23  . Tetanus Vaccine  09/21/2013  . Pap Smear  07/25/2016  . Complete foot exam   10/14/2016  . Hemoglobin A1C  11/14/2016  . Eye exam for  diabetics  12/02/2016  . Mammogram  12/27/2016  . Flu Shot  04/21/2017  . Pneumococcal vaccine (2) 07/27/2019  . Colon Cancer Screening  04/06/2021  . HIV Screening  Completed    Heart-Healthy Eating Plan Heart-healthy meal planning includes:  Limiting unhealthy fats.  Increasing healthy fats.  Making other small dietary changes. You may need to talk with your doctor or a diet specialist (dietitian) to create an eating plan that is right for you. What types of fat should I choose?  Choose healthy fats. These include olive oil and canola oil, flaxseeds, walnuts, almonds, and seeds.  Eat more omega-3 fats. These include salmon, mackerel, sardines, tuna, flaxseed oil, and ground flaxseeds. Try to eat fish at least twice each week.  Limit saturated fats.  Saturated fats are often found in animal products, such as meats, butter, and cream.  Plant sources of saturated fats include palm oil, palm kernel oil, and coconut oil.  Avoid foods with partially hydrogenated oils in them. These include stick margarine, some tub margarines, cookies, crackers, and other baked goods. These contain trans fats. What general guidelines do I need to follow?  Check food labels carefully. Identify foods with trans fats or high  amounts of saturated fat.  Fill one half of your plate with vegetables and green salads. Eat 4-5 servings of vegetables per day. A serving of vegetables is:  1 cup of raw leafy vegetables.   cup of raw or cooked cut-up vegetables.   cup of vegetable juice.  Fill one fourth of your plate with whole grains. Look for the word "whole" as the first word in the ingredient list.  Fill one fourth of your plate with lean protein foods.  Eat 4-5 servings of fruit per day. A serving of fruit is:  One medium whole fruit.   cup of dried fruit.   cup of fresh, frozen, or canned fruit.   cup of 100% fruit juice.  Eat more foods that contain soluble fiber. These include  apples, broccoli, carrots, beans, peas, and barley. Try to get 20-30 g of fiber per day.  Eat more home-cooked food. Eat less restaurant, buffet, and fast food.  Limit or avoid alcohol.  Limit foods high in starch and sugar.  Avoid fried foods.  Avoid frying your food. Try baking, boiling, grilling, or broiling it instead. You can also reduce fat by:  Removing the skin from poultry.  Removing all visible fats from meats.  Skimming the fat off of stews, soups, and gravies before serving them.  Steaming vegetables in water or broth.  Lose weight if you are overweight.  Eat 4-5 servings of nuts, legumes, and seeds per week:  One serving of dried beans or legumes equals  cup after being cooked.  One serving of nuts equals 1 ounces.  One serving of seeds equals  ounce or one tablespoon.  You may need to keep track of how much salt or sodium you eat. This is especially true if you have high blood pressure. Talk with your doctor or dietitian to get more information. What foods can I eat? Grains  Breads, including [place], white, pita, wheat, raisin, rye, oatmeal, and [place]. Tortillas that are neither fried nor made with lard or trans fat. Low-fat rolls, including hotdog and hamburger buns and English muffins. Biscuits. Muffins. Waffles. Pancakes. Light popcorn. Whole-grain cereals. Flatbread. Melba toast. Pretzels. Breadsticks. Rusks. Low-fat snacks. Low-fat crackers, including oyster, saltine, matzo, graham, animal, and rye. Rice and pasta, including brown rice and pastas that are made with whole wheat. Vegetables  All vegetables. Fruits  All fruits, but limit coconut. Meats and Other Protein Sources  Lean, well-trimmed beef, veal, pork, and lamb. Chicken and [place] without skin. All fish and shellfish. Wild duck, rabbit, pheasant, and venison. Egg whites or low-cholesterol egg substitutes. Dried beans, peas, lentils, and tofu. Seeds and most nuts. Dairy  Low-fat or nonfat  cheeses, including ricotta, string, and mozzarella. Skim or 1% milk that is liquid, powdered, or evaporated. Buttermilk that is made with low-fat milk. Nonfat or low-fat yogurt. Beverages  Mineral water. Diet carbonated beverages. Sweets and Desserts  Sherbets and fruit ices. Honey, jam, marmalade, jelly, and syrups. Meringues and gelatins. Pure sugar candy, such as hard candy, jelly beans, gumdrops, mints, marshmallows, and small amounts of dark chocolate. [employer] Eat all sweets and desserts in moderation. Fats and Oils  Nonhydrogenated (trans-free) margarines. Vegetable oils, including soybean, sesame, sunflower, olive, peanut, safflower, corn, canola, and cottonseed. Salad dressings or mayonnaise made with a vegetable oil. Limit added fats and oils that you use for cooking, baking, salads, and as spreads. Other  Cocoa powder. Coffee and tea. All seasonings and condiments. The items listed above may not be  a complete list of recommended foods or beverages. Contact your dietitian for more options.  What foods are not recommended? Grains  Breads that are made with saturated or trans fats, oils, or whole milk. Croissants. Butter rolls. Cheese breads. Sweet rolls. Donuts. Buttered popcorn. Chow mein noodles. High-fat crackers, such as cheese or butter crackers. Meats and Other Protein Sources  Fatty meats, such as hotdogs, short ribs, sausage, spareribs, bacon, rib eye roast or steak, and mutton. High-fat deli meats, such as salami and bologna. Caviar. Domestic duck and goose. Organ meats, such as kidney, liver, sweetbreads, and heart. Dairy  Cream, sour cream, cream cheese, and creamed cottage cheese. Whole-milk cheeses, including blue (bleu), Monte[place] Jack, [place], [place], American, [place], Swiss, cheddar, [place], and [place]. Whole or 2% milk that is liquid, evaporated, or condensed. Whole buttermilk. Cream sauce or high-fat cheese sauce. Yogurt that is made from whole milk. Beverages   Regular sodas and juice drinks with added sugar. Sweets and Desserts  Frosting. Pudding. Cookies. Cakes other than angel food cake. Candy that has milk chocolate or white chocolate, hydrogenated fat, butter, coconut, or unknown ingredients. Buttered syrups. Full-fat ice cream or ice cream drinks. Fats and Oils  Gravy that has suet, meat fat, or shortening. Cocoa butter, hydrogenated oils, palm oil, coconut oil, palm kernel oil. These can often be found in baked products, candy, fried foods, nondairy creamers, and whipped toppings. Solid fats and shortenings, including bacon fat, salt pork, lard, and butter. Nondairy cream substitutes, such as coffee creamers and sour cream substitutes. Salad dressings that are made of unknown oils, cheese, or sour cream. The items listed above may not be a complete list of foods and beverages to avoid. Contact your dietitian for more information.  This information is not intended to replace advice given to you by your health care provider. Make sure you discuss any questions you have with your health care provider. Document Released: 03/08/2012 Document Revised: 02/13/2016 Document Reviewed: 03/01/2014 Elsevier Interactive Patient Education  2017 [place] Maintenance, Female Adopting a healthy lifestyle and getting preventive care can go a long way to promote health and wellness. Talk with your health care provider about what schedule of regular examinations is right for you. This is a good chance for you to check in with your provider about disease prevention and staying healthy. In between checkups, there are plenty of things you can do on your own. Experts have done a lot of research about which lifestyle changes and preventive measures are most likely to keep you healthy. Ask your health care provider for more information. Weight and diet Eat a healthy diet  Be sure to include plenty of vegetables, fruits, low-fat dairy products, and lean  protein.  Do not eat a lot of foods high in solid fats, added sugars, or salt.  Get regular exercise. This is one of the most important things you can do for your health.  Most adults should exercise for at least 150 minutes each week. The exercise should increase your heart rate and make you sweat (moderate-intensity exercise).  Most adults should also do strengthening exercises at least twice a week. This is in addition to the moderate-intensity exercise. Maintain a healthy weight  Body mass index (BMI) is a measurement that can be used to identify possible weight problems. It estimates body fat based on height and weight. Your health care provider can help determine your BMI and help you achieve or maintain a healthy weight.  For females  45 years of age and older:  A BMI below 18.5 is considered underweight.  A BMI of 18.5 to 24.9 is normal.  A BMI of 25 to 29.9 is considered overweight.  A BMI of 30 and above is considered obese. Watch levels of cholesterol and blood lipids  You should start having your blood tested for lipids and cholesterol at 63 years of age, then have this test every 5 years.  You may need to have your cholesterol levels checked more often if:  Your lipid or cholesterol levels are high.  You are older than 63 years of age.  You are at high risk for heart disease. Cancer screening Lung Cancer  Lung cancer screening is recommended for adults 44-10 years old who are at high risk for lung cancer because of a history of smoking.  A yearly low-dose CT scan of the lungs is recommended for people who:  Currently smoke.  Have quit within the past 15 years.  Have at least a 30-pack-year history of smoking. A pack year is smoking an average of one pack of cigarettes a day for 1 year.  Yearly screening should continue until it has been 15 years since you quit.  Yearly screening should stop if you develop a health problem that would prevent you from having  lung cancer treatment. Breast Cancer  Practice breast self-awareness. This means understanding how your breasts normally appear and feel.  It also means doing regular breast self-exams. Let your health care provider know about any changes, no matter how small.  If you are in your 20s or 30s, you should have a clinical breast exam (CBE) by a health care provider every 1-3 years as part of a regular health exam.  If you are 63 or older, have a CBE every year. Also consider having a breast X-ray (mammogram) every year.  If you have a family history of breast cancer, talk to your health care provider about genetic screening.  If you are at high risk for breast cancer, talk to your health care provider about having an MRI and a mammogram every year.  Breast cancer gene (BRCA) assessment is recommended for women who have family members with BRCA-related cancers. BRCA-related cancers include:  Breast.  Ovarian.  Tubal.  Peritoneal cancers.  Results of the assessment will determine the need for genetic counseling and BRCA1 and BRCA2 testing. Cervical Cancer  Your health care provider may recommend that you be screened regularly for cancer of the pelvic organs (ovaries, uterus, and vagina). This screening involves a pelvic examination, including checking for microscopic changes to the surface of your cervix (Pap test). You may be encouraged to have this screening done every 3 years, beginning at age 47.  For women ages 51-65, health care providers may recommend pelvic exams and Pap testing every 3 years, or they may recommend the Pap and pelvic exam, combined with testing for human papilloma virus (HPV), every 5 years. Some types of HPV increase your risk of cervical cancer. Testing for HPV may also be done on women of any age with unclear Pap test results.  Other health care providers may not recommend any screening for nonpregnant women who are considered low risk for pelvic cancer and who do  not have symptoms. Ask your health care provider if a screening pelvic exam is right for you.  If you have had past treatment for cervical cancer or a condition that could lead to cancer, you need Pap tests and screening for cancer  for at least 20 years after your treatment. If Pap tests have been discontinued, your risk factors (such as having a new sexual partner) need to be reassessed to determine if screening should resume. Some women have medical problems that increase the chance of getting cervical cancer. In these cases, your health care provider may recommend more frequent screening and Pap tests. Colorectal Cancer  This type of cancer can be detected and often prevented.  Routine colorectal cancer screening usually begins at 63 years of age and continues through 63 years of age.  Your health care provider may recommend screening at an earlier age if you have risk factors for colon cancer.  Your health care provider may also recommend using home test kits to check for hidden blood in the stool.  A small camera at the end of a tube can be used to examine your colon directly (sigmoidoscopy or colonoscopy). This is done to check for the earliest forms of colorectal cancer.  Routine screening usually begins at age 30.  Direct examination of the colon should be repeated every 5-10 years through 63 years of age. However, you may need to be screened more often if early forms of precancerous polyps or small growths are found. Skin Cancer  Check your skin from head to toe regularly.  Tell your health care provider about any new moles or changes in moles, especially if there is a change in a mole's shape or color.  Also tell your health care provider if you have a mole that is larger than the size of a pencil eraser.  Always use sunscreen. Apply sunscreen liberally and repeatedly throughout the day.  Protect yourself by wearing long sleeves, pants, a wide-brimmed hat, and sunglasses  whenever you are outside. Heart disease, diabetes, and high blood pressure  High blood pressure causes heart disease and increases the risk of stroke. High blood pressure is more likely to develop in:  People who have blood pressure in the high end of the normal range (130-139/85-89 mm Hg).  People who are overweight or obese.  People who are African American.  If you are 79-81 years of age, have your blood pressure checked every 3-5 years. If you are 4 years of age or older, have your blood pressure checked every year. You should have your blood pressure measured twice-once when you are at a hospital or clinic, and once when you are not at a hospital or clinic. Record the average of the two measurements. To check your blood pressure when you are not at a hospital or clinic, you can use:  An automated blood pressure machine at a pharmacy.  A home blood pressure monitor.  If you are between 40 years and 93 years old, ask your health care provider if you should take aspirin to prevent strokes.  Have regular diabetes screenings. This involves taking a blood sample to check your fasting blood sugar level.  If you are at a normal weight and have a low risk for diabetes, have this test once every three years after 63 years of age.  If you are overweight and have a high risk for diabetes, consider being tested at a younger age or more often. Preventing infection Hepatitis B  If you have a higher risk for hepatitis B, you should be screened for this virus. You are considered at high risk for hepatitis B if:  You were born in a country where hepatitis B is common. Ask your health care provider which countries are  considered high risk.  Your parents were born in a high-risk country, and you have not been immunized against hepatitis B (hepatitis B vaccine).  You have HIV or AIDS.  You use needles to inject street drugs.  You live with someone who has hepatitis B.  You have had sex with  someone who has hepatitis B.  You get hemodialysis treatment.  You take certain medicines for conditions, including cancer, organ transplantation, and autoimmune conditions. Hepatitis C  Blood testing is recommended for:  Everyone born from [birth year] through 1967.  Anyone with known risk factors for hepatitis C. Sexually transmitted infections (STIs)  You should be screened for sexually transmitted infections (STIs) including gonorrhea and chlamydia if:  You are sexually active and are younger than 63 years of age.  You are older than 63 years of age and your health care provider tells you that you are at risk for this type of infection.  Your sexual activity has changed since you were last screened and you are at an increased risk for chlamydia or gonorrhea. Ask your health care provider if you are at risk.  If you do not have HIV, but are at risk, it may be recommended that you take a prescription medicine daily to prevent HIV infection. This is called pre-exposure prophylaxis (PrEP). You are considered at risk if:  You are sexually active and do not regularly use condoms or know the HIV status of your partner(s).  You take drugs by injection.  You are sexually active with a partner who has HIV. Talk with your health care provider about whether you are at high risk of being infected with HIV. If you choose to begin PrEP, you should first be tested for HIV. You should then be tested every 3 months for as long as you are taking PrEP. Pregnancy  If you are premenopausal and you may become pregnant, ask your health care provider about preconception counseling.  If you may become pregnant, take 400 to 800 micrograms (mcg) of folic acid every day.  If you want to prevent pregnancy, talk to your health care provider about birth control (contraception). Osteoporosis and menopause  Osteoporosis is a disease in which the bones lose minerals and strength with aging. This can result in  serious bone fractures. Your risk for osteoporosis can be identified using a bone density scan.  If you are 63 years of age or older, or if you are at risk for osteoporosis and fractures, ask your health care provider if you should be screened.  Ask your health care provider whether you should take a calcium or vitamin D supplement to lower your risk for osteoporosis.  Menopause may have certain physical symptoms and risks.  Hormone replacement therapy may reduce some of these symptoms and risks. Talk to your health care provider about whether hormone replacement therapy is right for you. Follow these instructions at home:  Schedule regular health, dental, and eye exams.  Stay current with your immunizations.  Do not use any tobacco products including cigarettes, chewing tobacco, or electronic cigarettes.  If you are pregnant, do not drink alcohol.  If you are breastfeeding, limit how much and how often you drink alcohol.  Limit alcohol intake to no more than 1 drink per day for nonpregnant women. One drink equals 12 ounces of beer, 5 ounces of wine, or 1 ounces of hard liquor.  Do not use street drugs.  Do not share needles.  Ask your health care provider for help if  you need support or information about quitting drugs.  Tell your health care provider if you often feel depressed.  Tell your health care provider if you have ever been abused or do not feel safe at home. This information is not intended to replace advice given to you by your health care provider. Make sure you discuss any questions you have with your health care provider. Document Released: 03/23/2011 Document Revised: 02/13/2016 Document Reviewed: 06/11/2015 Elsevier Interactive Patient Education  2017 [place] Prevention in the Home Falls can cause injuries and can affect people from all age groups. There are many simple things that you can do to make your home safe and to help prevent  falls. What can I do on the outside of my home?  Regularly repair the edges of walkways and driveways and fix any cracks.  Remove high doorway thresholds.  Trim any shrubbery on the main path into your home.  Use bright outdoor lighting.  Clear walkways of debris and clutter, including tools and rocks.  Regularly check that handrails are securely fastened and in good repair. Both sides of any steps should have handrails.  Install guardrails along the edges of any raised decks or porches.  Have leaves, snow, and ice cleared regularly.  Use sand or salt on walkways during winter months.  In the garage, clean up any spills right away, including grease or oil spills. What can I do in the bathroom?  Use night lights.  Install grab bars by the toilet and in the tub and shower. Do not use towel bars as grab bars.  Use non-skid mats or decals on the floor of the tub or shower.  If you need to sit down while you are in the shower, use a plastic, non-slip stool.  Keep the floor dry. Immediately clean up any water that spills on the floor.  Remove soap buildup in the tub or shower on a regular basis.  Attach bath mats securely with double-sided non-slip rug tape.  Remove throw rugs and other tripping hazards from the floor. What can I do in the bedroom?  Use night lights.  Make sure that a bedside light is easy to reach.  Do not use oversized bedding that drapes onto the floor.  Have a firm chair that has side arms to use for getting dressed.  Remove throw rugs and other tripping hazards from the floor. What can I do in the kitchen?  Clean up any spills right away.  Avoid walking on wet floors.  Place frequently used items in easy-to-reach places.  If you need to reach for something above you, use a sturdy step stool that has a grab bar.  Keep electrical cables out of the way.  Do not use floor polish or wax that makes floors slippery. If you have to use wax, make  sure that it is non-skid floor wax.  Remove throw rugs and other tripping hazards from the floor. What can I do in the stairways?  Do not leave any items on the stairs.  Make sure that there are handrails on both sides of the stairs. Fix handrails that are broken or loose. Make sure that handrails are as long as the stairways.  Check any carpeting to make sure that it is firmly attached to the stairs. Fix any carpet that is loose or worn.  Avoid having throw rugs at the top or bottom of stairways, or secure the rugs with carpet tape to prevent them  from moving.  Make sure that you have a light switch at the top of the stairs and the bottom of the stairs. If you do not have them, have them installed. What are some other fall prevention tips?  Wear closed-toe shoes that fit well and support your feet. Wear shoes that have rubber soles or low heels.  When you use a stepladder, make sure that it is completely opened and that the sides are firmly locked. Have someone hold the ladder while you are using it. Do not climb a closed stepladder.  Add color or contrast paint or tape to grab bars and handrails in your home. Place contrasting color strips on the first and last steps.  Use mobility aids as needed, such as canes, walkers, scooters, and crutches.  Turn on lights if it is dark. Replace any light bulbs that burn out.  Set up furniture so that there are clear paths. Keep the furniture in the same spot.  Fix any uneven floor surfaces.  Choose a carpet design that does not hide the edge of steps of a stairway.  Be aware of any and all pets.  Review your medicines with your healthcare provider. Some medicines can cause dizziness or changes in blood pressure, which increase your risk of falling. Talk with your health care provider about other ways that you can decrease your risk of falls. This may include working with a physical therapist or trainer to improve your strength, balance, and  endurance. This information is not intended to replace advice given to you by your health care provider. Make sure you discuss any questions you have with your health care provider. Document Released: 08/28/2002 Document Revised: 02/04/2016 Document Reviewed: 10/12/2014 Elsevier Interactive Patient Education  2017 [employer].

## 2016-12-23 NOTE — Assessment & Plan Note (Signed)
S: suspect controlled- has been off metformin due to b12 issues CBGs- does not check Exercise and diet- has gained weight and not exercising Lab Results  Component Value Date   HGBA1C 5.2 05/14/2016   HGBA1C 5.7 01/08/2016   HGBA1C 5.7 10/08/2015   A/P: update a1c, work on weight loss again and restart exercise

## 2016-12-23 NOTE — Progress Notes (Signed)
Subjective:  Erin Kirk is a 61 y.o. year old very pleasant female patient who presents for/with See problem oriented charting ROS- No chest pain or shortness of breath. No headache or blurry vision. Numbness and tingling in both hands and some pain but improving steadily   Past Medical History-  Patient Active Problem List   Diagnosis Date Noted  . Low vitamin B12 level 09/07/2016    Priority: High  . Disability examination 01/17/2015    Priority: High  . Type II diabetes mellitus, well controlled ([place]) 11/28/2010    Priority: High  . PAROXYSMAL ATRIAL FIBRILLATION 07/02/2010    Priority: High  . Primary hypercoagulable state ([place]) 11/30/2007    Priority: High  . Hx of adenomatous polyp of colon 04/10/2016    Priority: Medium  . CKD (chronic kidney disease), stage III 09/05/2014    Priority: Medium  . Hypothyroidism 03/09/2007    Priority: Medium  . Essential hypertension 03/09/2007    Priority: Medium  . History of Melanoma 03/09/2007    Priority: Medium  . Encounter for therapeutic drug monitoring 10/26/2013    Priority: Low  . Left facial pain 09/24/2012    Priority: Low  . Oral mucosal lesion 09/24/2012    Priority: Low  . TRANSIENT DISORDER INITIATING/MAINTAINING SLEEP 04/02/2010    Priority: Low  . Overweight(278.02) 11/29/2009    Priority: Low  . LOW BACK PAIN 06/15/2008    Priority: Low  . ALLERGIC RHINITIS 03/09/2007    Priority: Low  . GERD 03/09/2007    Priority: Low    Medications- reviewed and updated Current Outpatient Prescriptions  Medication Sig Dispense Refill  . acetaminophen (TYLENOL) 325 MG tablet Take 650 mg by mouth every 6 (six) hours as needed for mild pain or fever. Reported on 01/13/2016    . diltiazem (CARDIZEM CD) 360 MG 24 hr capsule TAKE ONE CAPSULE BY MOUTH ONCE DAILY 90 capsule 3  . levothyroxine (SYNTHROID, LEVOTHROID) 100 MCG tablet TAKE ONE TABLET BY MOUTH ONCE DAILY 90 tablet 2  . metoprolol succinate (TOPROL-XL) 25 MG 24 hr  tablet TAKE ONE TABLET BY MOUTH ONCE DAILY 90 tablet 1  . ranitidine (ZANTAC) 150 MG tablet Take 1 tablet (150 mg total) by mouth 2 (two) times daily. 180 tablet 3  . triamterene-hydrochlorothiazide (MAXZIDE-25) 37.5-25 MG tablet TAKE ONE TABLET BY MOUTH ONCE DAILY. 90 tablet 3  . valsartan (DIOVAN) 160 MG tablet Take 1 tablet (160 mg total) by mouth daily. 30 tablet 11  . Vitamin D, Ergocalciferol, (DRISDOL) 50000 units CAPS capsule TAKE 1 CAPSULE BY MOUTH ONCE A WEEK 12 capsule 17  . warfarin (COUMADIN) 5 MG tablet TAKE BY MOUTH AS DIRECTED BY ANTICOAGULATION CLINIC 40 tablet 5   No current facility-administered medications for this visit.     Objective: BP 120/80 (BP Location: Left Arm, Patient Position: Sitting, Cuff Size: Normal)   Pulse 74   Temp 98 F (36.7 C) (Oral)   Ht 5\' 4"  (1.626 m)   Wt 175 lb 9.6 oz (79.7 kg)   SpO2 97%   BMI 30.14 kg/m  Gen: NAD, resting comfortably CV: RRR no murmurs rubs or gallops Lungs: CTAB no crackles, wheeze, rhonchi Abdomen: soft/nontender/nondistended/normal bowel sounds. obese Ext: no edema Skin: warm, dry  Assessment/Plan:  Thinks she had HCV tested before but no record. States we did it here. I only see HIV  Trial off folic acid. No clear indication in problem list. Macrocytosis was thought due to b12 deficiency.   Low vitamin  B12 level S: patient has been receiving B12 injections now on monthly basis Lab Results  Component Value Date   VITAMINB12 676 09/28/2016  last level was in normal range. She was having hand numbness and pain in both hands- thought deficiency could be related to metformin.   Pain and numbness/tingling about 75% better. At times fingers get bluish coloration and into hand A/P: continue replacement- update b12 today. Suspect in normal range and will have continued gradual improvement   Essential hypertension S: controlled on diltiazem 360mg  XR, metoprolol 25 mg XL, maxzide 25, valsartan 160mg .  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 Had recurrence in the past when working- permanently disabled as result- no issues. Lupus hypercoagulability syndrome. A/P: continue regular coumadin and pt/inr checks. Permanent disability- filled out paperwork today  Type II diabetes mellitus, well controlled S: suspect controlled- has been off metformin due to b12 issues CBGs- does not check Exercise and diet- has gained weight and not exercising Lab Results  Component Value Date   HGBA1C 5.2 05/14/2016   HGBA1C 5.7 01/08/2016   HGBA1C 5.7 10/08/2015   A/P: update a1c, work on weight loss again and restart exercise  PAROXYSMAL ATRIAL FIBRILLATION S: rate controlled on diltiazem and metoprolol and anticoagulated with coumadin A/P: continue current rx  Hypothyroidism S: Lab Results  Component Value Date   TSH 0.68 08/28/2016   On thyroid medication-levothyroxine 137mcg A/P: update tsh  Return in about 3 months (around 03/24/2017).  Orders Placed This Encounter  Procedures  . Hemoglobin A1c    [place]  . Vitamin B12  . VITAMIN D 25 Hydroxy (Vit-D Deficiency, Fractures)    [place]  . CBC    [place]  . Comprehensive metabolic panel    [place]  . LDL cholesterol, direct    [place]  . TSH      . Hepatitis C antibody, reflex    [facility]   Return precautions advised.  Garret Reddish, MD

## 2016-12-24 LAB — HEPATITIS C ANTIBODY: HCV Ab: NEGATIVE

## 2016-12-24 NOTE — Telephone Encounter (Signed)
Call to Dr. Ronita Hipps office and per Jonelle Sidle The patient had her last pap completed on 01/14/2004 Result was wnl w acute inflammation. Noted that she does not need any more pap test  Will note in record

## 2016-12-24 NOTE — Telephone Encounter (Signed)
Thanks for update- I changed health maintenance to state not candidate for cervical cancer screening.

## 2017-01-14 ENCOUNTER — Ambulatory Visit (INDEPENDENT_AMBULATORY_CARE_PROVIDER_SITE_OTHER): Payer: Medicare Other | Admitting: *Deleted

## 2017-01-14 DIAGNOSIS — E538 Deficiency of other specified B group vitamins: Secondary | ICD-10-CM | POA: Diagnosis not present

## 2017-01-14 MED ORDER — CYANOCOBALAMIN 1000 MCG/ML IJ SOLN
1000.0000 ug | Freq: Once | INTRAMUSCULAR | Status: AC
  Administered 2017-01-14: 1000 ug via INTRAMUSCULAR

## 2017-01-14 NOTE — Progress Notes (Addendum)
Patient seen for b12 injection; administered to rt deltoid IM; patient tolerated well; no s/s of reactions.   I have reviewed and agree with note, evaluation, plan.   Garret Reddish, MD

## 2017-01-18 ENCOUNTER — Ambulatory Visit (INDEPENDENT_AMBULATORY_CARE_PROVIDER_SITE_OTHER): Payer: Medicare Other | Admitting: General Practice

## 2017-01-18 DIAGNOSIS — Z5181 Encounter for therapeutic drug level monitoring: Secondary | ICD-10-CM | POA: Diagnosis not present

## 2017-01-18 DIAGNOSIS — D6859 Other primary thrombophilia: Secondary | ICD-10-CM

## 2017-01-18 LAB — POCT INR: INR: 2.3

## 2017-01-18 NOTE — Progress Notes (Signed)
I have reviewed and agree with note, evaluation, plan.   Malaiya Paczkowski, MD  

## 2017-01-18 NOTE — Patient Instructions (Signed)
Pre visit review using our clinic review tool, if applicable. No additional management support is needed unless otherwise documented below in the visit note. 

## 2017-01-21 ENCOUNTER — Ambulatory Visit (INDEPENDENT_AMBULATORY_CARE_PROVIDER_SITE_OTHER): Payer: Medicare Other | Admitting: Family Medicine

## 2017-01-21 ENCOUNTER — Encounter: Payer: Self-pay | Admitting: Family Medicine

## 2017-01-21 VITALS — BP 120/78 | HR 69 | Temp 98.2°F | Ht 64.0 in | Wt 183.4 lb

## 2017-01-21 DIAGNOSIS — M79674 Pain in right toe(s): Secondary | ICD-10-CM | POA: Diagnosis not present

## 2017-01-21 NOTE — Progress Notes (Signed)
Pre visit review using our clinic review tool, if applicable. No additional management support is needed unless otherwise documented below in the visit note. 

## 2017-01-21 NOTE — Progress Notes (Signed)
Subjective:  Erin Kirk is a 61 y.o. year old very pleasant female patient who presents for/with See problem oriented charting ROS- no expanding redness. No fever or chills. No swelling in the foot or ankle.    Past Medical History-  Patient Active Problem List   Diagnosis Date Noted  . Low vitamin B12 level 09/07/2016    Priority: High  . Disability examination 01/17/2015    Priority: High  . Type II diabetes mellitus, well controlled ([place]) 11/28/2010    Priority: High  . PAROXYSMAL ATRIAL FIBRILLATION 07/02/2010    Priority: High  . Primary hypercoagulable state ([place]) 11/30/2007    Priority: High  . Hx of adenomatous polyp of colon 04/10/2016    Priority: Medium  . CKD (chronic kidney disease), stage III 09/05/2014    Priority: Medium  . Hypothyroidism 03/09/2007    Priority: Medium  . Essential hypertension 03/09/2007    Priority: Medium  . History of Melanoma 03/09/2007    Priority: Medium  . Encounter for therapeutic drug monitoring 10/26/2013    Priority: Low  . Left facial pain 09/24/2012    Priority: Low  . Oral mucosal lesion 09/24/2012    Priority: Low  . TRANSIENT DISORDER INITIATING/MAINTAINING SLEEP 04/02/2010    Priority: Low  . Overweight(278.02) 11/29/2009    Priority: Low  . LOW BACK PAIN 06/15/2008    Priority: Low  . ALLERGIC RHINITIS 03/09/2007    Priority: Low  . GERD 03/09/2007    Priority: Low    Medications- reviewed and updated Current Outpatient Prescriptions  Medication Sig Dispense Refill  . acetaminophen (TYLENOL) 325 MG tablet Take 650 mg by mouth every 6 (six) hours as needed for mild pain or fever. Reported on 01/13/2016    . diltiazem (CARDIZEM CD) 360 MG 24 hr capsule TAKE ONE CAPSULE BY MOUTH ONCE DAILY 90 capsule 3  . folic acid (FOLVITE) 1 MG tablet Take 1 tablet by mouth daily.    [place] levothyroxine (SYNTHROID, LEVOTHROID) 100 MCG tablet TAKE ONE TABLET BY MOUTH ONCE DAILY 90 tablet 2  . metoprolol succinate (TOPROL-XL) 25 MG  24 hr tablet TAKE ONE TABLET BY MOUTH ONCE DAILY 90 tablet 1  . ranitidine (ZANTAC) 150 MG tablet Take 1 tablet (150 mg total) by mouth 2 (two) times daily. 180 tablet 3  . triamterene-hydrochlorothiazide (MAXZIDE-25) 37.5-25 MG tablet TAKE ONE TABLET BY MOUTH ONCE DAILY. 90 tablet 3  . valsartan (DIOVAN) 160 MG tablet Take 1 tablet (160 mg total) by mouth daily. 30 tablet 11  . Vitamin D, Ergocalciferol, (DRISDOL) 50000 units CAPS capsule TAKE 1 CAPSULE BY MOUTH ONCE A WEEK 12 capsule 17  . warfarin (COUMADIN) 5 MG tablet TAKE BY MOUTH AS DIRECTED BY ANTICOAGULATION CLINIC 40 tablet 5   No current facility-administered medications for this visit.     Objective: BP 120/78 (BP Location: Left Arm, Patient Position: Sitting, Cuff Size: Normal)   Pulse 69   Temp 98.2 F (36.8 C) (Oral)   Ht 5\' 4"  (1.626 m)   Wt 183 lb 6.4 oz (83.2 kg)   SpO2 98%   BMI 31.48 kg/m  Gen: NAD, resting comfortably CV: RRR no murmurs rubs or gallops Lungs: CTAB no crackles, wheeze, rhonchi Ext: no edema Right 2nd toe on medial side at the DIP joint there is a raised slightly erythematous area with more central redness- surface is smooth. Not a clear blister. Area about half a centimeter. When she stands- this area presses up against the IP joint  of great toe Skin: warm, dry, no redness around above lesion  Assessment/Plan:  Toe pain, right S: pain started about a week ago. Noted a red area at the DIP joint on the 2nd toe on medial side- this area presses up firmly against her great toe IP joint. When she is on her feet really bothers her. Better with rest. Had to be on feet all day yesterday cooking a family dinner and decided needed to come in as pain up to moderate to severe aching. Tried gel caps which help some. Also tried toe separators but those would fall out. Not wearing shoes tight at the end.  A/P: I think this may be a callous/corn but top of this is smooth which is somewhat atypical. She does not  tolerate manipulation of area well- considered trying to debride/shave top layer but she would prefer referral to podiatry which is very reasonable. With level of pain and atypical appearance- would ask their opinion on x-ray as well.   Orders Placed This Encounter  Procedures  . Ambulatory referral to Podiatry    Referral Priority:   Routine    Referral Type:   Consultation    Referral Reason:   Specialty Services Required    Requested Specialty:   Podiatry    Number of Visits Requested:   1   Meds ordered this encounter  Medications  . folic acid (FOLVITE) 1 MG tablet    Sig: Take 1 tablet by mouth daily.    Return precautions advised. See after visit summary.  Garret Reddish, MD

## 2017-01-21 NOTE — Patient Instructions (Addendum)
We will call you within a week or two about your referral to podiatry. If you do not hear by next Thursday, give [place] a call.   I would try to stay off the foot and use your gel cap.   Let us know immediately if any fever, expanding redness, worsening pain  Would schedule tylenol 650 mg every 6 hours while awake

## 2017-02-01 ENCOUNTER — Ambulatory Visit (INDEPENDENT_AMBULATORY_CARE_PROVIDER_SITE_OTHER): Payer: Medicare Other | Admitting: Podiatry

## 2017-02-01 ENCOUNTER — Encounter: Payer: Self-pay | Admitting: Podiatry

## 2017-02-01 VITALS — Resp 16 | Ht 64.0 in | Wt 178.0 lb

## 2017-02-01 DIAGNOSIS — L84 Corns and callosities: Secondary | ICD-10-CM | POA: Diagnosis not present

## 2017-02-01 NOTE — Progress Notes (Signed)
   Subjective:    Patient ID: Erin Kirk, female    DOB: August 22, 1956, 61 y.o.   MRN: [id number]  HPI    Review of Systems  Endocrine: Positive for cold intolerance.  All other systems reviewed and are negative.      Objective:   Physical Exam        Assessment & Plan:

## 2017-02-01 NOTE — Progress Notes (Signed)
   Subjective: Patient with PMHx of T2DM presents to the office today for chief complaint of sharp, shooting, aching, throbbing, burning pain secondary to callus lesions noted to the medial sides of bilateral second toes onset three weeks ago. Patient states that the pain is affecting her ability to ambulate without pain. She has soaked the areas in Epsom salt which provided some relief. Patient presents today for further treatment and evaluation.  Objective:  Physical Exam General: Alert and oriented x3 in no acute distress  Dermatology: Hyperkeratotic lesion present on the medial side of the right second toe. Pain on palpation with a central nucleated core noted.  Skin is warm, dry and supple bilateral lower extremities. Negative for open lesions or macerations.  Vascular: Palpable pedal pulses bilaterally. No edema or erythema noted. Capillary refill within normal limits.  Neurological: Epicritic and protective threshold grossly intact bilaterally.   Musculoskeletal Exam: Pain on palpation at the keratotic lesion noted. Range of motion within normal limits bilateral. Muscle strength 5/5 in all groups bilateral.  Assessment: #1 Callus medial right second toe   Plan of Care:  #1 Patient evaluated #2 Excisional debridement of keratoic lesion using a chisel blade was performed without incident.  #3 Treated area(s) with Salinocaine and dressed with light dressing. #4 Silicone toe caps dispensed.  #5 Patient is to return to the clinic PRN.   Edrick Kins, DPM [facility]  Dr. Edrick Kins, [address]                                        [place], [place] [address]                Office [phone]  Fax [phone]

## 2017-02-03 ENCOUNTER — Other Ambulatory Visit: Payer: Self-pay | Admitting: Family Medicine

## 2017-02-10 ENCOUNTER — Other Ambulatory Visit: Payer: Self-pay | Admitting: Family Medicine

## 2017-02-11 ENCOUNTER — Encounter: Payer: Self-pay | Admitting: Neurology

## 2017-02-11 ENCOUNTER — Ambulatory Visit (INDEPENDENT_AMBULATORY_CARE_PROVIDER_SITE_OTHER): Payer: Medicare Other | Admitting: Neurology

## 2017-02-11 VITALS — Ht 64.0 in | Wt 187.1 lb

## 2017-02-11 DIAGNOSIS — R202 Paresthesia of skin: Secondary | ICD-10-CM | POA: Diagnosis not present

## 2017-02-11 DIAGNOSIS — E538 Deficiency of other specified B group vitamins: Secondary | ICD-10-CM | POA: Diagnosis not present

## 2017-02-11 NOTE — Progress Notes (Signed)
Follow-up Visit   Date: 02/11/17    Erin Kirk MRN: [id number] DOB: 1956-04-27   Interim History: Erin Kirk is a 61 y.o. right-handed Caucasian female with hypercoagulable state with history of PE on coumadin, hypertension, history of melanoma involving cervical lymph nodes (2005), hypothyroidism, diabetes mellitus (5.2) and vitamin B12 deficiency returning to the clinic for follow-up of paresthesias of the hands and feet.  The patient was accompanied to the clinic by self.  History of present illness: She was taking metformin in the fall 2017 and developed diarrhea and two weeks later, she developed numbness of the fingers and hands bilaterally.  She was able to move her fingers, but felt as if her entire hand was swollen and had no sensation.  She stopped driving because she was unable to feet the steering wheel.  She also complains of numbness and tingling of the feet.  She was found to have vitamin B12 deficiency and has been taking weekly injections.  Her supplementation has helped because her pain is not as intense and she has enough sensation where she is able to drive.  She reports having cold hands and throbbing pain of her arms. Sometimes, her hands get swollen and purple discoloration. She denies any imbalance or falls.  She endorses some weakness of the hands with grip.   UPDATE 02/11/2017:  She is here for follow-up appointment for paresthesias of the hands and feet.  Since February, she reports 50-60% improvement with respect to tingling of the feet since being on vitamin B12 injections.  However, the numbness/tingling in her hands continues to persist, maybe slight improvement but she was expecting greater return of sensation by now.  She denies any worsening or new weakness.  She no longer notices swelling or discoloration of the hands either.  Her NCS/EMG of the left arm and leg was normal.  She is concerned about her weight gain and has many questions regarding this.     Medications:  Current Outpatient Prescriptions on File Prior to Visit  Medication Sig Dispense Refill  . acetaminophen (TYLENOL) 325 MG tablet Take 650 mg by mouth every 6 (six) hours as needed for mild pain or fever. Reported on 01/13/2016    . diltiazem (CARDIZEM CD) 360 MG 24 hr capsule TAKE ONE CAPSULE BY MOUTH ONCE DAILY 90 capsule 3  . levothyroxine (SYNTHROID, LEVOTHROID) 100 MCG tablet TAKE ONE TABLET BY MOUTH ONCE DAILY 90 tablet 2  . metoprolol succinate (TOPROL-XL) 25 MG 24 hr tablet TAKE ONE TABLET BY MOUTH ONCE DAILY 90 tablet 1  . ranitidine (ZANTAC) 150 MG tablet TAKE ONE TABLET BY MOUTH TWICE DAILY 60 tablet 11  . triamterene-hydrochlorothiazide (MAXZIDE-25) 37.5-25 MG tablet TAKE ONE TABLET BY MOUTH ONCE DAILY. 90 tablet 3  . valsartan (DIOVAN) 160 MG tablet Take 1 tablet (160 mg total) by mouth daily. 30 tablet 11  . Vitamin D, Ergocalciferol, (DRISDOL) 50000 units CAPS capsule TAKE 1 CAPSULE BY MOUTH ONCE A WEEK 12 capsule 17  . warfarin (COUMADIN) 5 MG tablet TAKE BY MOUTH AS DIRECTED BY ANTICOAGULATION CLINIC 40 tablet 5   No current facility-administered medications on file prior to visit.     Allergies:  Allergies  Allergen Reactions  . Iohexol Anaphylaxis, Hives and Shortness Of Breath    Incident 1990, premedicated prior to obtaining IV contrast since.  . Amoxicillin-Pot Clavulanate Swelling and Other (See Comments)    Facial swelling  . Other     Contrast dye  Review of Systems:  CONSTITUTIONAL: No fevers, chills, night sweats, or weight loss.  EYES: No visual changes or eye pain ENT: No hearing changes.  No history of nose bleeds.   RESPIRATORY: No cough, wheezing and shortness of breath.   CARDIOVASCULAR: Negative for chest pain, and palpitations.   GI: Negative for abdominal discomfort, blood in stools or black stools.  No recent change in bowel habits.   GU:  No history of incontinence.   MUSCLOSKELETAL: No history of joint pain or swelling.  No  myalgias.   SKIN: Negative for lesions, rash, and itching.   ENDOCRINE: Negative for cold or heat intolerance, polydipsia or goiter.   PSYCH:  No depression or anxiety symptoms.   NEURO: As Above.   Vital Signs:  Ht '5\' 4"'$  (1.626 m)   Wt 187 lb 2 oz (84.9 kg)   SpO2 96%   BMI 32.12 kg/m   Neurological Exam: MENTAL STATUS including orientation to time, place, person, recent and remote memory, attention span and concentration, language, and fund of knowledge is normal.  Speech is not dysarthric.  CRANIAL NERVES:  Pupils equal round and reactive to light.  Normal conjugate, extra-ocular eye movements in all directions of gaze.  No ptosis. Normal facial sensation.  Face is symmetric. Palate elevates symmetrically.  Tongue is midline.  MOTOR:  Motor strength is 5/5 in all extremities.  No pronator drift.    MSRs:  Reflexes are brisk and symmetric 2+/4 throughout.  SENSORY:  Intact to vibration and temperature throughout.  COORDINATION/GAIT:   Gait narrow based and stable.   Data: NCS/EMG of the left arm and leg 11/19/2016: This is a normal study.  In particular, there is no evidence of a generalized sensorimotor polyneuropathy, cervical/lumbosacral radiculopathy, or carpal tunnel syndrome affecting the left side.  Labs 08/28/2016:  Vitamin B12 109** Labs 11/16/2016:  Heavy metal screen neg, vitamin B6 11.7, vitamin B1 14, folate 23.4, ceruloplasmin 25, copper 97, MMA 223, ESR 5  IMPRESSION/PLAN: Paresthesias of the hands and feet due to vitamin B12 deficiency.  Symptoms have improved by ~ 50% in the feet and less so in the upper extremities.  There has not been any worsening or new neurological symptoms.  I explained that she should continue to appreciate improvement especially because her electrodiagnostic testing was normal. Extensive serology testing was also normal (see above).  To look for alternative possibilities such as demyelinating disease, MRI brain was discussed; however, she  would like to hold on this for now.  She will continue monthly vitamin B12 injections and if no improvement in 4-6 months, reconsider additional neurological testing.  Return to clinic in 6 months   The duration of this appointment visit was 25 minutes of face-to-face time with the patient.  Greater than 50% of this time was spent in counseling, explanation of diagnosis, planning of further management, and coordination of care.   Thank you for allowing me to participate in patient's care.  If I can answer any additional questions, I would be pleased to do so.    Sincerely,    Jose Corvin K. Posey Pronto, DO

## 2017-02-11 NOTE — Patient Instructions (Addendum)
Continue monthly vitamin B12 injections  Return to clinic in 6 months

## 2017-02-16 ENCOUNTER — Ambulatory Visit (INDEPENDENT_AMBULATORY_CARE_PROVIDER_SITE_OTHER): Payer: Medicare Other | Admitting: *Deleted

## 2017-02-16 DIAGNOSIS — E538 Deficiency of other specified B group vitamins: Secondary | ICD-10-CM | POA: Diagnosis not present

## 2017-02-16 MED ORDER — CYANOCOBALAMIN 1000 MCG/ML IJ SOLN
1000.0000 ug | Freq: Once | INTRAMUSCULAR | Status: AC
  Administered 2017-02-16: 1000 ug via INTRAMUSCULAR

## 2017-02-16 NOTE — Progress Notes (Signed)
Patient here for b12 injection, administered injection to left deltoid; patient tolerated well; no s/s of reactions.

## 2017-02-24 ENCOUNTER — Ambulatory Visit (INDEPENDENT_AMBULATORY_CARE_PROVIDER_SITE_OTHER): Payer: Medicare Other | Admitting: General Practice

## 2017-02-24 DIAGNOSIS — Z5181 Encounter for therapeutic drug level monitoring: Secondary | ICD-10-CM

## 2017-02-24 DIAGNOSIS — D6859 Other primary thrombophilia: Secondary | ICD-10-CM

## 2017-02-24 LAB — POCT INR: INR: 2.4

## 2017-02-24 NOTE — Patient Instructions (Signed)
Pre visit review using our clinic review tool, if applicable. No additional management support is needed unless otherwise documented below in the visit note. 

## 2017-02-24 NOTE — Progress Notes (Signed)
I have reviewed and agree with note, evaluation, plan.   Shanvi Moyd, MD  

## 2017-03-15 ENCOUNTER — Other Ambulatory Visit: Payer: Self-pay | Admitting: Family Medicine

## 2017-03-19 ENCOUNTER — Ambulatory Visit (INDEPENDENT_AMBULATORY_CARE_PROVIDER_SITE_OTHER): Payer: Medicare Other

## 2017-03-19 DIAGNOSIS — E538 Deficiency of other specified B group vitamins: Secondary | ICD-10-CM | POA: Diagnosis not present

## 2017-03-19 MED ORDER — CYANOCOBALAMIN 1000 MCG/ML IJ SOLN
1000.0000 ug | Freq: Once | INTRAMUSCULAR | Status: AC
  Administered 2017-03-19: 1000 ug via INTRAMUSCULAR

## 2017-03-25 ENCOUNTER — Other Ambulatory Visit: Payer: Self-pay | Admitting: Family Medicine

## 2017-03-29 ENCOUNTER — Ambulatory Visit (INDEPENDENT_AMBULATORY_CARE_PROVIDER_SITE_OTHER): Payer: Medicare Other | Admitting: General Practice

## 2017-03-29 DIAGNOSIS — D6859 Other primary thrombophilia: Secondary | ICD-10-CM

## 2017-03-29 DIAGNOSIS — Z5181 Encounter for therapeutic drug level monitoring: Secondary | ICD-10-CM | POA: Diagnosis not present

## 2017-03-29 LAB — POCT INR: INR: 2.5

## 2017-03-29 NOTE — Patient Instructions (Signed)
Pre visit review using our clinic review tool, if applicable. No additional management support is needed unless otherwise documented below in the visit note. 

## 2017-03-29 NOTE — Progress Notes (Signed)
I have reviewed and agree with note, evaluation, plan.   Dhilan Brauer, MD  

## 2017-04-16 ENCOUNTER — Ambulatory Visit: Payer: Medicare Other

## 2017-04-16 ENCOUNTER — Other Ambulatory Visit (INDEPENDENT_AMBULATORY_CARE_PROVIDER_SITE_OTHER): Payer: Medicare Other

## 2017-04-16 DIAGNOSIS — E538 Deficiency of other specified B group vitamins: Secondary | ICD-10-CM

## 2017-04-16 MED ORDER — CYANOCOBALAMIN 1000 MCG/ML IJ SOLN
1000.0000 ug | Freq: Once | INTRAMUSCULAR | Status: AC
  Administered 2017-04-16: 1000 ug via INTRAMUSCULAR

## 2017-04-16 NOTE — Progress Notes (Signed)
I have reviewed and agree with note, evaluation, plan. Monthly b12 injection given Lab Results  Component Value Date   VITAMINB12 835 12/23/2016   Garret Reddish, MD

## 2017-04-22 ENCOUNTER — Telehealth: Payer: Self-pay

## 2017-04-22 NOTE — Telephone Encounter (Signed)
Received a request from the pharmacy [facility]) to change patient's medication   valsartan (DIOVAN) 160 MG tablet  Due to recall. Please advise on an alernative

## 2017-04-23 ENCOUNTER — Other Ambulatory Visit: Payer: Self-pay

## 2017-04-23 MED ORDER — IRBESARTAN 150 MG PO TABS: 150.0000 mg | ORAL_TABLET | Freq: Every day | ORAL | 11 refills | Status: DC

## 2017-04-23 NOTE — Telephone Encounter (Signed)
Spoke with patient an dlet her know that we are sending in the new medication. She verbalized understanding and requested 30 day refills instead of 90 day. She is already scheduled for a follow up visit on 05/27/17.

## 2017-04-23 NOTE — Telephone Encounter (Signed)
Irbesartan 150mg  please #90 for daily use 3 refills. Recheck bp 1 month

## 2017-04-26 ENCOUNTER — Ambulatory Visit (INDEPENDENT_AMBULATORY_CARE_PROVIDER_SITE_OTHER): Payer: Medicare Other | Admitting: General Practice

## 2017-04-26 DIAGNOSIS — D6859 Other primary thrombophilia: Secondary | ICD-10-CM

## 2017-04-26 DIAGNOSIS — Z5181 Encounter for therapeutic drug level monitoring: Secondary | ICD-10-CM

## 2017-04-26 LAB — POCT INR: INR: 3.6

## 2017-04-26 NOTE — Progress Notes (Signed)
I have reviewed and agree with note, evaluation, plan.   Jaelin Fackler, MD  

## 2017-04-26 NOTE — Patient Instructions (Signed)
Pre visit review using our clinic review tool, if applicable. No additional management support is needed unless otherwise documented below in the visit note. 

## 2017-05-18 ENCOUNTER — Ambulatory Visit (INDEPENDENT_AMBULATORY_CARE_PROVIDER_SITE_OTHER): Payer: Medicare Other

## 2017-05-18 DIAGNOSIS — E538 Deficiency of other specified B group vitamins: Secondary | ICD-10-CM

## 2017-05-18 MED ORDER — CYANOCOBALAMIN 1000 MCG/ML IJ SOLN
1000.0000 ug | Freq: Once | INTRAMUSCULAR | Status: AC
  Administered 2017-05-18: 1000 ug via INTRAMUSCULAR

## 2017-05-18 NOTE — Progress Notes (Signed)
Pt came for vit b12 injection. She tolerated her injection well.

## 2017-05-27 ENCOUNTER — Ambulatory Visit (INDEPENDENT_AMBULATORY_CARE_PROVIDER_SITE_OTHER): Payer: Medicare Other | Admitting: Family Medicine

## 2017-05-27 ENCOUNTER — Encounter: Payer: Self-pay | Admitting: Family Medicine

## 2017-05-27 VITALS — BP 120/72 | HR 71 | Temp 98.4°F | Ht 64.0 in | Wt 199.8 lb

## 2017-05-27 DIAGNOSIS — I10 Essential (primary) hypertension: Secondary | ICD-10-CM | POA: Diagnosis not present

## 2017-05-27 DIAGNOSIS — E538 Deficiency of other specified B group vitamins: Secondary | ICD-10-CM

## 2017-05-27 DIAGNOSIS — Z23 Encounter for immunization: Secondary | ICD-10-CM

## 2017-05-27 DIAGNOSIS — E039 Hypothyroidism, unspecified: Secondary | ICD-10-CM

## 2017-05-27 DIAGNOSIS — E119 Type 2 diabetes mellitus without complications: Secondary | ICD-10-CM

## 2017-05-27 DIAGNOSIS — D6859 Other primary thrombophilia: Secondary | ICD-10-CM | POA: Diagnosis not present

## 2017-05-27 LAB — COMPREHENSIVE METABOLIC PANEL
ALT: 18 U/L (ref 0–35)
AST: 17 U/L (ref 0–37)
Albumin: 4.5 g/dL (ref 3.5–5.2)
Alkaline Phosphatase: 74 U/L (ref 39–117)
BUN: 17 mg/dL (ref 6–23)
CHLORIDE: 101 meq/L (ref 96–112)
CO2: 31 meq/L (ref 19–32)
CREATININE: 1.1 mg/dL (ref 0.40–1.20)
Calcium: 9.3 mg/dL (ref 8.4–10.5)
GFR: 53.27 mL/min — ABNORMAL LOW (ref >60.00)
Glucose, Bld: 93 mg/dL (ref 70–99)
Potassium: 3.6 mEq/L (ref 3.5–5.1)
SODIUM: 140 meq/L (ref 135–145)
Total Bilirubin: 0.9 mg/dL (ref 0.2–1.2)
Total Protein: 7.1 g/dL (ref 6.0–8.3)

## 2017-05-27 LAB — CBC
HCT: 32.7 % — ABNORMAL LOW (ref 36.0–46.0)
Hemoglobin: 10.5 g/dL — ABNORMAL LOW (ref 12.0–15.0)
MCHC: 32.1 g/dL (ref 30.0–36.0)
MCV: 78.8 fl (ref 78.0–100.0)
Platelets: 252 10*3/uL (ref 150.0–400.0)
RBC: 4.15 Mil/uL (ref 3.87–5.11)
RDW: 15 % (ref 11.5–15.5)
WBC: 7 10*3/uL (ref 4.0–10.5)

## 2017-05-27 LAB — HEMOGLOBIN A1C: Hgb A1c MFr Bld: 6.4 % (ref 4.6–6.5)

## 2017-05-27 LAB — TSH: TSH: 1.69 u[IU]/mL (ref 0.35–4.50)

## 2017-05-27 NOTE — Addendum Note (Signed)
Addended by: Mariam Dollar, Roselyn Reef M on: 05/27/2017 11:35 AM   Modules accepted: Orders

## 2017-05-27 NOTE — Assessment & Plan Note (Signed)
S: a1c up off of metformin but was stopped due to b12 deficiency and still controlled. Has not been checking cbgs as much due to hands bothering her Lab Results  Component Value Date   HGBA1C 6.2 12/23/2016   HGBA1C 5.2 05/14/2016   HGBA1C 5.7 01/08/2016   A/P: update a1c today. Has gained weight and not eating as well. Would consider glimepiride- she wants to work on weight loss first

## 2017-05-27 NOTE — Patient Instructions (Addendum)
Please stop by lab before you go  Info on possible new medicine below. Would only use this if a1c above 7 until you get the weight back down- up about 20 lbs this year so wouldn't be surprised if a1c is above 7.   Flu shot today.   Health Maintenance Due  Topic Date Due  . OPHTHALMOLOGY EXAM  12/02/2016  . MAMMOGRAM  12/27/2016     Glimepiride tablets What is this medicine? GLIMEPIRIDE (GLYE me pye ride) helps to treat type 2 diabetes. Treatment is combined with diet and exercise. This medicine helps your body use insulin better. This medicine may be used for other purposes; ask your health care provider or pharmacist if you have questions. COMMON BRAND NAME(S): Amaryl What should I tell my health care provider before I take this medicine? They need to know if you have any of these conditions: -diabetic ketoacidosis -glucose-6-phosphate dehydrogenase deficiency -heart disease -kidney disease -liver disease -severe infection or injury -thyroid disease -an unusual or allergic reaction to glimepiride, sulfa drugs, other medicines, foods, dyes, or preservatives -pregnancy or recent attempts to get pregnant -breast-feeding How should I use this medicine? Take this medicine by mouth. Swallow with a drink of water. Follow the directions on the prescription label. Take your dose at the same time each day, with breakfast or your first large meal. Do not take more often than directed. Talk to your pediatrician regarding the use of this medicine in children. Special care may be needed. Elderly patients over 28 years old can have a stronger reaction and need a smaller dose. Overdosage: If you think you have taken too much of this medicine contact a poison control center or emergency room at once. NOTE: This medicine is only for you. Do not share this medicine with others. What if I miss a dose? If you miss a dose, take it as soon as you can. If it is almost time for your next dose, take only  that dose. Do not take double or extra doses. What may interact with this medicine? -bosentan -chloramphenicol -cisapride -clarithromycin -medicines for fungal or yeast infections -metoclopramide -probenecid -warfarin Many medications may cause an increase or decrease in blood sugar, these include: -alcohol containing beverages -aspirin and aspirin-like drugs -chloramphenicol -chromium -female hormones, like estrogens or progestins and birth control pills -fluoxetine -heart medicines like disopyramide -isoniazid -female hormones or anabolic steroids -medicines called MAO Inhibitors like Nardil, Parnate, Marplan, Eldepryl -medicines for allergies, asthma, cold, or cough -medicines for mental problems -medicines for weight loss -niacin -NSAIDs, medicines for pain and inflammation, like ibuprofen or naproxen -pentamidine -phenytoin -probenecid -quinolone antibiotics like ciprofloxacin, levofloxacin, ofloxacin -some herbal dietary supplements -steroid medicines like prednisone or cortisone -thyroid medicine -water pills or diuretics This list may not describe all possible interactions. Give your health care provider a list of all the medicines, herbs, non-prescription drugs, or dietary supplements you use. Also tell them if you smoke, drink alcohol, or use illegal drugs. Some items may interact with your medicine. What should I watch for while using this medicine? Visit your doctor or health care professional for regular checks on your progress. A test called the HbA1C (A1C) will be monitored. This is a simple blood test. It measures your blood sugar control over the last 2 to 3 months. You will receive this test every 3 to 6 months. Learn how to check your blood sugar. Learn the symptoms of low and high blood sugar and how to manage them. Always carry a quick-source  of sugar with you in case you have symptoms of low blood sugar. Examples include hard sugar candy or glucose tablets.  Make sure others know that you can choke if you eat or drink when you develop serious symptoms of low blood sugar, such as seizures or unconsciousness. They must get medical help at once. Tell your doctor or health care professional if you have high blood sugar. You might need to change the dose of your medicine. If you are sick or exercising more than usual, you might need to change the dose of your medicine. Do not skip meals. Ask your doctor or health care professional if you should avoid alcohol. Many nonprescription cough and cold products contain sugar or alcohol. These can affect blood sugar. This medicine can make you more sensitive to the sun. Keep out of the sun. If you cannot avoid being in the sun, wear protective clothing and use sunscreen. Do not use sun lamps or tanning beds/booths. Wear a medical ID bracelet or chain, and carry a card that describes your disease and details of your medicine and dosage times. What side effects may I notice from receiving this medicine? Side effects that you should report to your doctor or health care professional as soon as possible: -allergic reactions like skin rash, itching or hives, swelling of the face, lips, or tongue -breathing problems -dark urine -fever, chills, sore throat -signs and symptoms of low blood sugar such as feeling anxious, confusion, dizziness, increased hunger, unusually weak or tired, sweating, shakiness, cold, irritable, headache, blurred vision, fast heartbeat, loss of consciousness -unusual bleeding or bruising -yellowing of the eyes or skin Side effects that usually do not require medical attention (report to your doctor or health care professional if they continue or are bothersome): -diarrhea -dizziness -headache -heartburn -nausea -stomach gas This list may not describe all possible side effects. Call your doctor for medical advice about side effects. You may report side effects to FDA at 1-800-FDA-1088. Where  should I keep my medicine? Keep out of the reach of children. Store at room temperature below 30 degrees C (86 degrees F). Throw away any unused medicine after the expiration date. NOTE: This sheet is a summary. It may not cover all possible information. If you have questions about this medicine, talk to your doctor, pharmacist, or health care provider.  2018 Elsevier/Gold Standard (2012-12-21 14:29:47)

## 2017-05-27 NOTE — Addendum Note (Signed)
Addended by: Tomi Likens on: 05/27/2017 11:39 AM   Modules accepted: Orders

## 2017-05-27 NOTE — Progress Notes (Addendum)
Subjective:  Erin Kirk is a 61 y.o. year old very pleasant female patient who presents for/with See problem oriented charting ROS- No chest pain or shortness of breath. No headache or blurry vision. Still with hand numbness/tingling/pain   Past Medical History-  Patient Active Problem List   Diagnosis Date Noted  . Low vitamin B12 level 09/07/2016    Priority: High  . Disability examination 01/17/2015    Priority: High  . Type II diabetes mellitus, well controlled ([place]) 11/28/2010    Priority: High  . PAROXYSMAL ATRIAL FIBRILLATION 07/02/2010    Priority: High  . Primary hypercoagulable state ([place]) 11/30/2007    Priority: High  . Hx of adenomatous polyp of colon 04/10/2016    Priority: Medium  . CKD (chronic kidney disease), stage III 09/05/2014    Priority: Medium  . Hypothyroidism 03/09/2007    Priority: Medium  . Essential hypertension 03/09/2007    Priority: Medium  . History of Melanoma 03/09/2007    Priority: Medium  . Encounter for therapeutic drug monitoring 10/26/2013    Priority: Low  . Left facial pain 09/24/2012    Priority: Low  . Oral mucosal lesion 09/24/2012    Priority: Low  . TRANSIENT DISORDER INITIATING/MAINTAINING SLEEP 04/02/2010    Priority: Low  . Overweight(278.02) 11/29/2009    Priority: Low  . LOW BACK PAIN 06/15/2008    Priority: Low  . ALLERGIC RHINITIS 03/09/2007    Priority: Low  . GERD 03/09/2007    Priority: Low    Medications- reviewed and updated Current Outpatient Prescriptions  Medication Sig Dispense Refill  . acetaminophen (TYLENOL) 325 MG tablet Take 650 mg by mouth every 6 (six) hours as needed for mild pain or fever. Reported on 01/13/2016    . diltiazem (CARDIZEM CD) 360 MG 24 hr capsule TAKE ONE CAPSULE BY MOUTH ONCE DAILY 90 capsule 3  . irbesartan (AVAPRO) 150 MG tablet Take 1 tablet (150 mg total) by mouth daily. 30 tablet 11  . levothyroxine (SYNTHROID, LEVOTHROID) 100 MCG tablet TAKE ONE TABLET BY MOUTH ONCE DAILY  90 tablet 2  . metoprolol succinate (TOPROL-XL) 25 MG 24 hr tablet TAKE ONE TABLET BY MOUTH ONCE DAILY 90 tablet 1  . ranitidine (ZANTAC) 150 MG tablet TAKE ONE TABLET BY MOUTH TWICE DAILY 60 tablet 11  . triamterene-hydrochlorothiazide (MAXZIDE-25) 37.5-25 MG tablet TAKE ONE TABLET BY MOUTH ONCE DAILY. 90 tablet 3  . Vitamin D, Ergocalciferol, (DRISDOL) 50000 units CAPS capsule TAKE 1 CAPSULE BY MOUTH ONCE A WEEK 12 capsule 17  . warfarin (COUMADIN) 5 MG tablet TAKE BY MOUTH AS DIRECTED BY ANTICOAGULATION CLINIC 40 tablet 5   No current facility-administered medications for this visit.     Objective: BP 120/72 (BP Location: Left Arm, Patient Position: Sitting, Cuff Size: Large)   Pulse 71   Temp 98.4 F (36.9 C) (Oral)   Ht 5\' 4"  (1.626 m)   Wt 199 lb 12.8 oz (90.6 kg)   SpO2 98%   BMI 34.30 kg/m  Gen: NAD, resting comfortably No thyromegaly CV: RRR no murmurs rubs or gallops Lungs: CTAB no crackles, wheeze, rhonchi Abdomen: soft/nontender/nondistended/normal bowel sounds. No rebound or guarding.  Ext: no edema Skin: warm, dry  Assessment/Plan:  Tuesday for yearly GYN exam and mammogram with Dr. Ronita Hipps  Atrial Fibrillation- paroxysmal. Rate controlled on diltiazem and metoprolol and anticoagulated with coumadin  Needs updated eye exam  Low vitamin B12 level S: patient has been receiving b12 injections monthly. b12 normal on last 2  checks.  Prior numbness and pain in both hands- has continued to improved. swtiches back and forth between mild numbness and mild tingling- sometimes happens in her feet as well. Still following with Dr. Posey Pronto. Still with some pain in the hands.   Swelling in legs after injections A/P: continues to get b12 injections monthly- with prior severity of symptoms this is reasonable  Essential hypertension S: controlled on irbesartan 150 mg (instead of valsartan due to recall), maxzide 25, metoprolol 25 mg XL, diltiazem 360mg  XR.  BP Readings from  Last 3 Encounters:  05/27/17 120/72  01/21/17 120/78  12/23/16 120/80  A/P: We discussed blood pressure goal of <140/90. 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 Recurrence when working even on meds- permanently disabled. Lupus hypercoagulability syndrome A/P: continue coumadin. On permanent disability  Type II diabetes mellitus, well controlled ([place]) S: a1c up off of metformin but was stopped due to b12 deficiency and still controlled. Has not been checking cbgs as much due to hands bothering her Lab Results  Component Value Date   HGBA1C 6.2 12/23/2016   HGBA1C 5.2 05/14/2016   HGBA1C 5.7 01/08/2016   A/P: update a1c today. Has gained weight and not eating as well. Would consider glimepiride- she wants to work on weight loss first  Hypothyroidism S: Lab Results  Component Value Date   TSH 2.41 12/23/2016   On thyroid medication-levothyroxine 160mcg A/P: update tsh with labs   Future Appointments Date Time Provider [place]  05/31/2017 10:30 AM [facility]  06/18/2017 10:00 AM [facility]  08/20/2017 9:30 AM Narda Amber K, DO [facility] None   Return in about 14 weeks (around 09/02/2017) for follow up- or sooner if needed.  Orders Placed This Encounter  Procedures  . Flu Vaccine QUAD 36+ mos IM  . Hemoglobin A1c    [place]    Standing Status:   Future    Standing Expiration Date:   05/27/2018  . TSH    Standing Status:   Future    Standing Expiration Date:   05/27/2018  . CBC    Standing Status:   Future    Standing Expiration Date:   05/27/2018  . Comprehensive metabolic panel    [place]    Standing Status:   Future    Standing Expiration Date:   05/27/2018   Return precautions advised.  Garret Reddish, MD

## 2017-05-27 NOTE — Assessment & Plan Note (Signed)
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 Recurrence when working even on meds- permanently disabled. Lupus hypercoagulability syndrome A/P: continue coumadin. On permanent disability

## 2017-05-27 NOTE — Assessment & Plan Note (Signed)
S: patient has been receiving b12 injections monthly. b12 normal on last 2 checks.  Prior numbness and pain in both hands- has continued to improved. swtiches back and forth between mild numbness and mild tingling- sometimes happens in her feet as well. Still following with Dr. Posey Pronto. Still with some pain in the hands.   Swelling in legs after injections A/P: continues to get b12 injections monthly- with prior severity of symptoms this is reasonable

## 2017-05-27 NOTE — Assessment & Plan Note (Signed)
S: Lab Results  Component Value Date   TSH 2.41 12/23/2016   On thyroid medication-levothyroxine 13mcg A/P: update tsh with labs

## 2017-05-27 NOTE — Assessment & Plan Note (Signed)
S: controlled on irbesartan 150 mg (instead of valsartan due to recall), maxzide 25, metoprolol 25 mg XL, diltiazem 360mg  XR.  BP Readings from Last 3 Encounters:  05/27/17 120/72  01/21/17 120/78  12/23/16 120/80  A/P: We discussed blood pressure goal of <140/90. Continue current meds

## 2017-05-31 ENCOUNTER — Ambulatory Visit (INDEPENDENT_AMBULATORY_CARE_PROVIDER_SITE_OTHER): Payer: Medicare Other | Admitting: General Practice

## 2017-05-31 DIAGNOSIS — D6859 Other primary thrombophilia: Secondary | ICD-10-CM | POA: Diagnosis not present

## 2017-05-31 DIAGNOSIS — Z7901 Long term (current) use of anticoagulants: Secondary | ICD-10-CM | POA: Diagnosis not present

## 2017-05-31 DIAGNOSIS — Z5181 Encounter for therapeutic drug level monitoring: Secondary | ICD-10-CM

## 2017-05-31 LAB — POCT INR: INR: 4

## 2017-05-31 NOTE — Progress Notes (Signed)
I have reviewed and agree with note, evaluation, plan.   Stephen Hunter, MD  

## 2017-05-31 NOTE — Patient Instructions (Signed)
Pre visit review using our clinic review tool, if applicable. No additional management support is needed unless otherwise documented below in the visit note. 

## 2017-06-01 DIAGNOSIS — Z124 Encounter for screening for malignant neoplasm of cervix: Secondary | ICD-10-CM | POA: Diagnosis not present

## 2017-06-01 DIAGNOSIS — Z1231 Encounter for screening mammogram for malignant neoplasm of breast: Secondary | ICD-10-CM | POA: Diagnosis not present

## 2017-06-01 DIAGNOSIS — Z01419 Encounter for gynecological examination (general) (routine) without abnormal findings: Secondary | ICD-10-CM | POA: Diagnosis not present

## 2017-06-01 LAB — HM MAMMOGRAPHY

## 2017-06-02 DIAGNOSIS — Z7901 Long term (current) use of anticoagulants: Secondary | ICD-10-CM | POA: Insufficient documentation

## 2017-06-14 ENCOUNTER — Encounter: Payer: Self-pay | Admitting: Family Medicine

## 2017-06-18 ENCOUNTER — Ambulatory Visit (INDEPENDENT_AMBULATORY_CARE_PROVIDER_SITE_OTHER): Payer: Medicare Other

## 2017-06-18 DIAGNOSIS — E538 Deficiency of other specified B group vitamins: Secondary | ICD-10-CM | POA: Diagnosis not present

## 2017-06-18 MED ORDER — CYANOCOBALAMIN 1000 MCG/ML IJ SOLN
1000.0000 ug | Freq: Once | INTRAMUSCULAR | Status: AC
  Administered 2017-06-18: 1000 ug via INTRAMUSCULAR

## 2017-07-02 ENCOUNTER — Ambulatory Visit (INDEPENDENT_AMBULATORY_CARE_PROVIDER_SITE_OTHER): Payer: Medicare Other | Admitting: *Deleted

## 2017-07-02 ENCOUNTER — Ambulatory Visit: Payer: Self-pay

## 2017-07-02 DIAGNOSIS — Z7901 Long term (current) use of anticoagulants: Secondary | ICD-10-CM

## 2017-07-02 DIAGNOSIS — D6859 Other primary thrombophilia: Secondary | ICD-10-CM | POA: Diagnosis not present

## 2017-07-02 LAB — POCT INR: INR: 2.7

## 2017-07-04 NOTE — Progress Notes (Signed)
I have reviewed and agree with note, evaluation, plan.   Wymon Swaney, MD  

## 2017-07-12 IMAGING — CT CT ABD-PELV W/O CM
2 of 4 series · 16 of 46 positions shown, 18 images · non-contrast
Comparison: None.

CLINICAL DATA: Patient with history of left lower quadrant pain and
nausea. Hematuria.

EXAM:
CT ABDOMEN AND PELVIS WITHOUT CONTRAST
TECHNIQUE: Multidetector CT imaging of the abdomen and pelvis was performed
following the standard protocol without IV contrast.

[Series 2: abd/ pelvis 5.0 i40f 2 · axial · 0.79mm/px · z∈[-448,-42]mm · 13 of 89 slices shown, 15 images]
[im 4/89  soft-tissue]
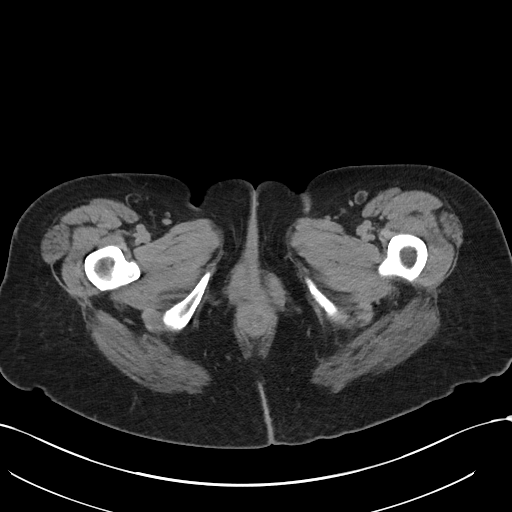
[im 4/89  bone]
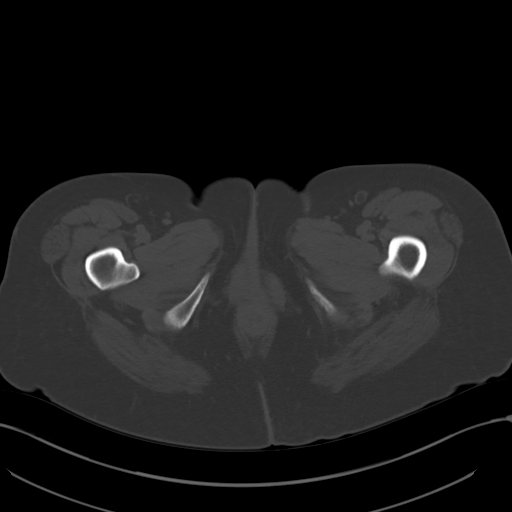
[im 12/89  soft-tissue]
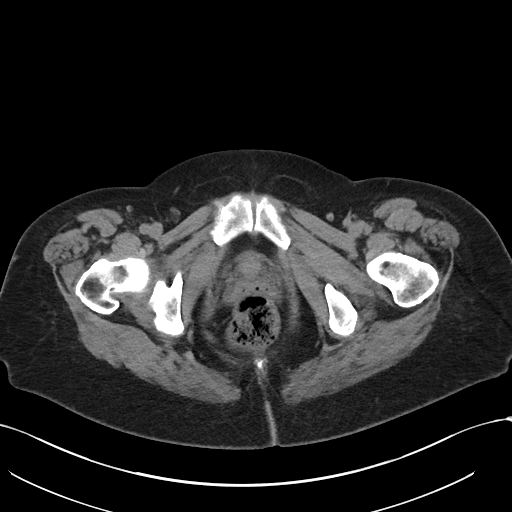
[im 19/89  soft-tissue]
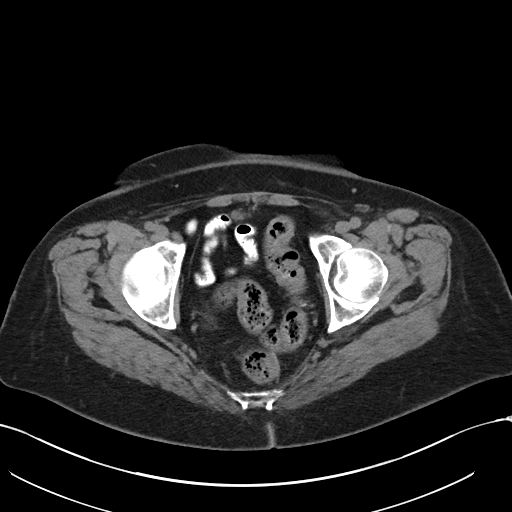
[im 26/89  soft-tissue]
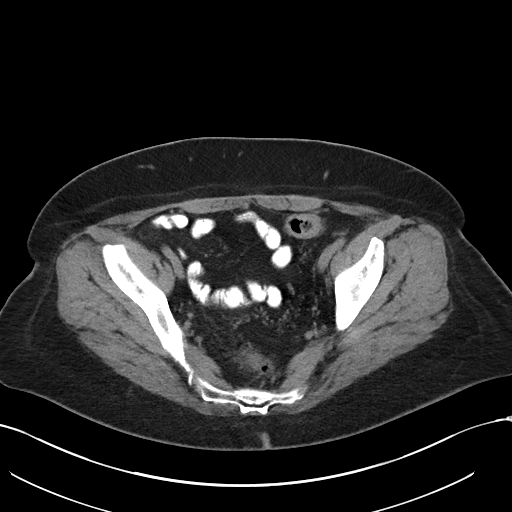
[im 30/89  soft-tissue]
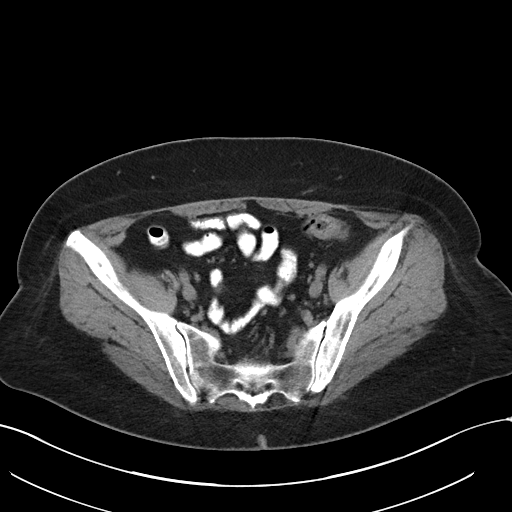
[im 37/89  soft-tissue]
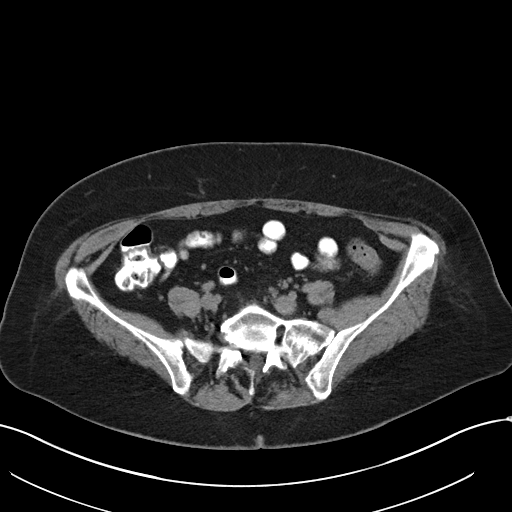
[im 45/89  soft-tissue]
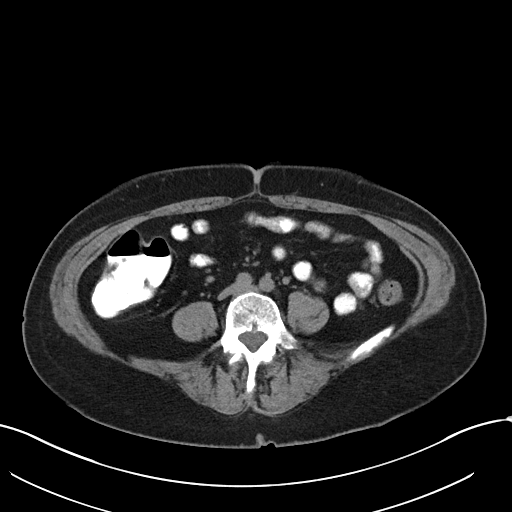
[im 52/89  soft-tissue]
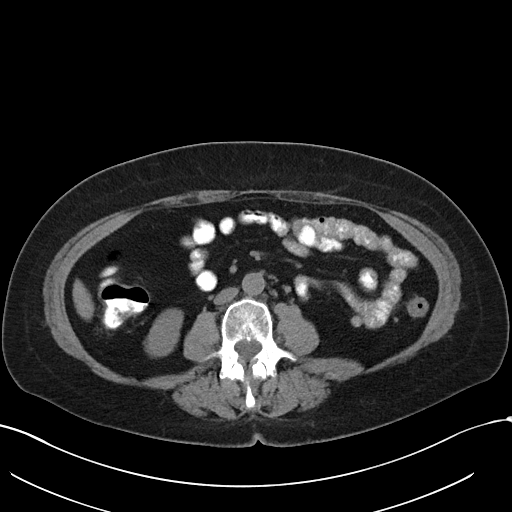
[im 59/89  soft-tissue]
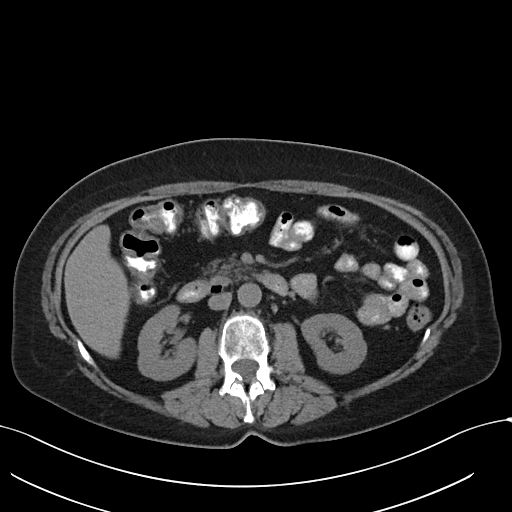
[im 59/89  bone]
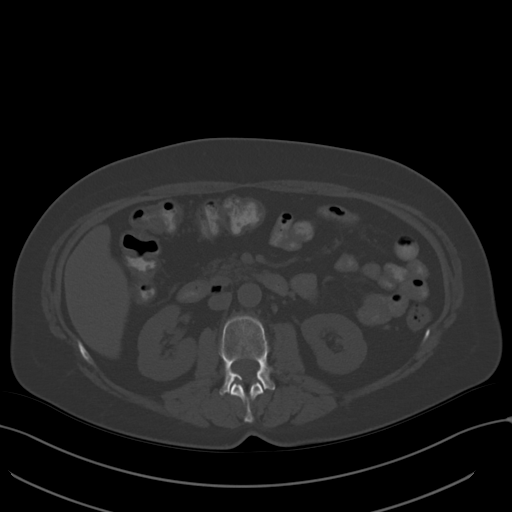
[im 63/89  soft-tissue]
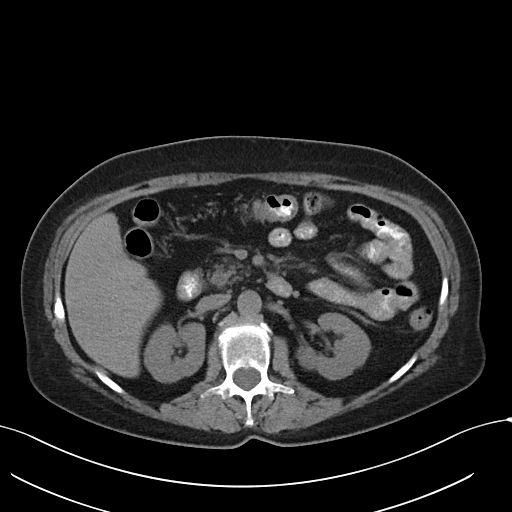
[im 70/89  soft-tissue]
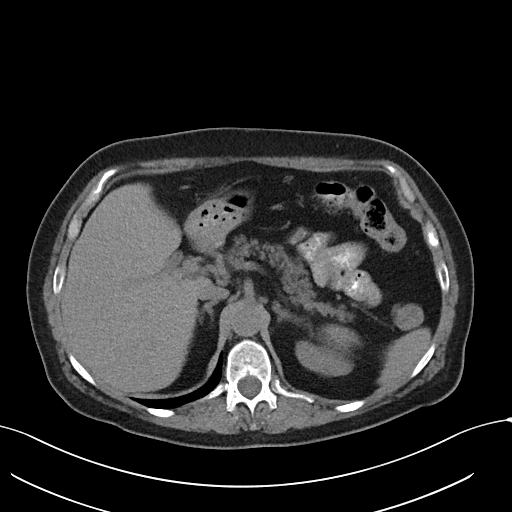
[im 78/89  soft-tissue]
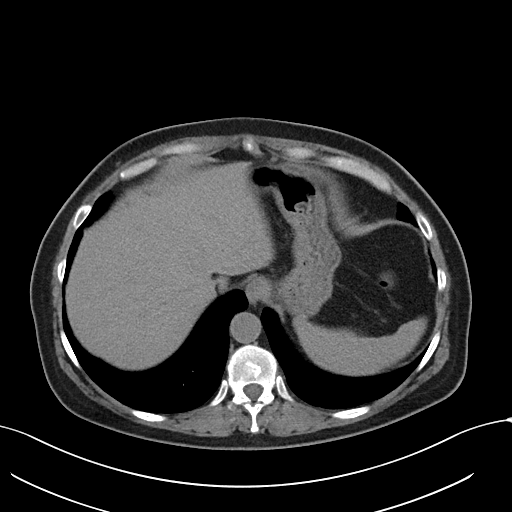
[im 85/89  soft-tissue]
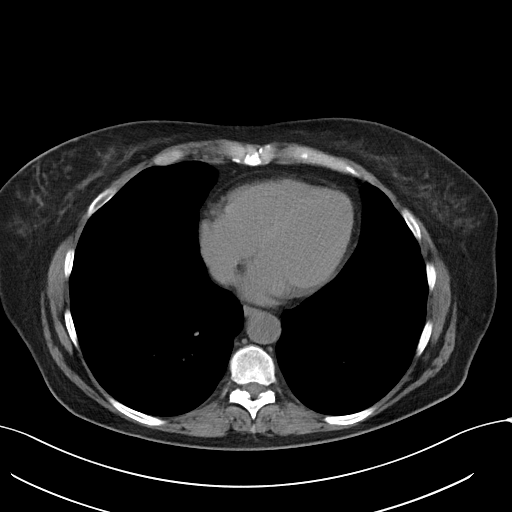

[Series 5: cor st · coronal · 0.69mm/px · 3 of 74 slices shown]
[im 25/74  soft-tissue]
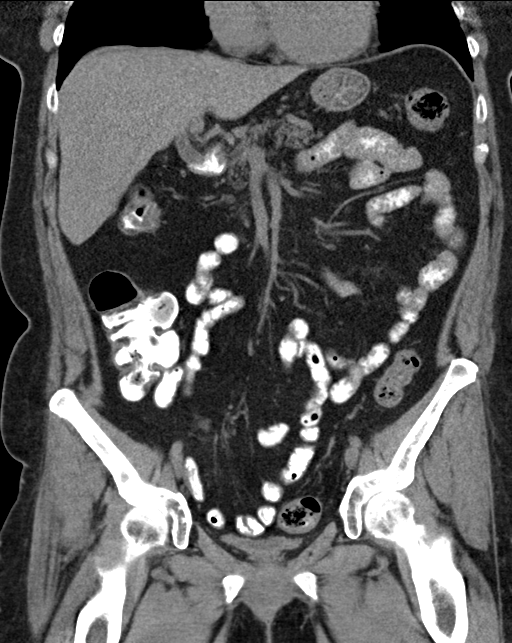
[im 33/74  soft-tissue]
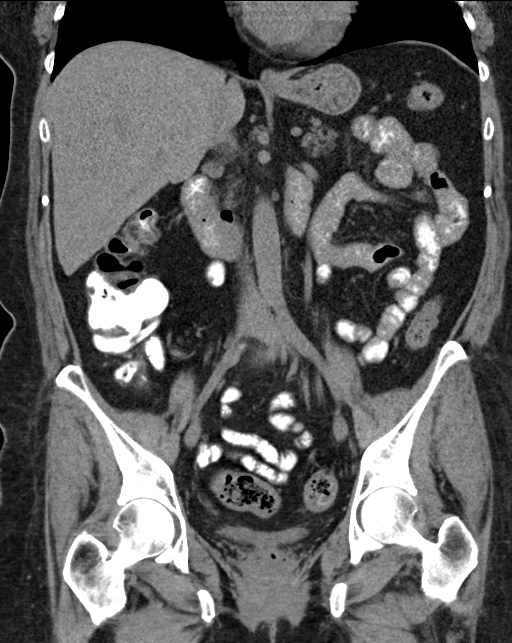
[im 41/74  soft-tissue]
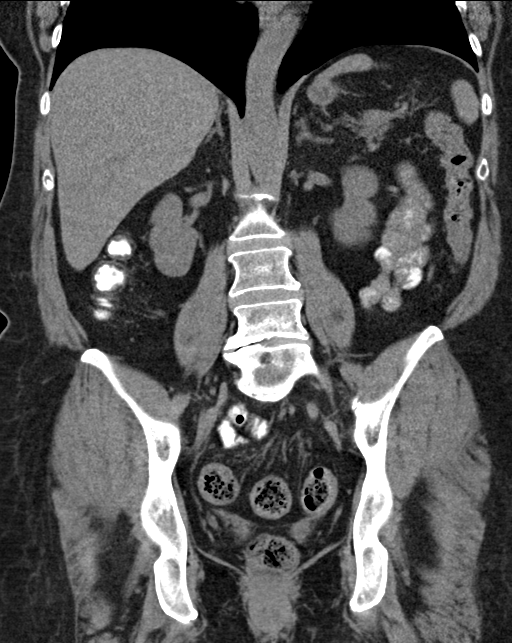

[16 of 46 positions shown; findings below may reference images not displayed]

FINDINGS: Lower chest: Normal heart size. No consolidative pulmonary
opacities. No pleural effusion or pneumothorax.

Hepatobiliary: Liver is normal in size and contour. Patient status
post cholecystectomy. Fatty deposition adjacent to the falciform
ligament.

Pancreas: Unremarkable

Spleen: Unremarkable

Adrenals/Urinary Tract: Normal adrenal glands. Kidneys are symmetric
in size. No hydronephrosis. Urinary bladder is decompressed.

Stomach/Bowel: Mild wall thickening of the sigmoid colon without
pericolonic fat stranding. Normal appendix. No evidence for bowel
obstruction. No free fluid or free intraperitoneal air.

Vascular/Lymphatic: Normal caliber abdominal aorta. No
retroperitoneal lymphadenopathy.

Reproductive: Patient status post hysterectomy.

Other: None.

Musculoskeletal: Lumbar spine degenerative changes. No aggressive or
acute appearing osseous lesions.
IMPRESSION: Suggestion of mild wall thickening of the sigmoid colon without
pericolonic fat stranding, potentially secondary to underdistention.
Mild colitis is not excluded.

## 2017-07-16 ENCOUNTER — Encounter: Payer: Self-pay | Admitting: Family Medicine

## 2017-07-16 ENCOUNTER — Ambulatory Visit (INDEPENDENT_AMBULATORY_CARE_PROVIDER_SITE_OTHER): Payer: Medicare Other | Admitting: Family Medicine

## 2017-07-16 DIAGNOSIS — E538 Deficiency of other specified B group vitamins: Secondary | ICD-10-CM

## 2017-07-16 MED ORDER — CYANOCOBALAMIN 1000 MCG/ML IJ SOLN
1000.0000 ug | Freq: Once | INTRAMUSCULAR | Status: AC
  Administered 2017-07-16: 1000 ug via INTRAMUSCULAR

## 2017-07-16 NOTE — Progress Notes (Signed)
Monthly b12 shot given today.  Lab Results  Component Value Date   [id number] 115 12/23/2016  Last b12 at goal. Continue monthly injections  Erin Kirk

## 2017-07-23 ENCOUNTER — Other Ambulatory Visit: Payer: Self-pay | Admitting: Family Medicine

## 2017-07-30 ENCOUNTER — Ambulatory Visit: Payer: Medicare Other

## 2017-08-02 ENCOUNTER — Ambulatory Visit (INDEPENDENT_AMBULATORY_CARE_PROVIDER_SITE_OTHER): Payer: Medicare Other | Admitting: General Practice

## 2017-08-02 DIAGNOSIS — Z7901 Long term (current) use of anticoagulants: Secondary | ICD-10-CM | POA: Diagnosis not present

## 2017-08-02 LAB — POCT INR: INR: 2.6

## 2017-08-02 NOTE — Progress Notes (Signed)
I agree with this plan.

## 2017-08-02 NOTE — Patient Instructions (Addendum)
Pre visit review using our clinic review tool, if applicable. No additional management support is needed unless otherwise documented below in the visit note. ° °Continue taking 1 tablet daily.  Re-check in 4 weeks. °

## 2017-08-13 ENCOUNTER — Other Ambulatory Visit: Payer: Self-pay | Admitting: Family Medicine

## 2017-08-16 ENCOUNTER — Encounter: Payer: Self-pay | Admitting: *Deleted

## 2017-08-16 ENCOUNTER — Ambulatory Visit (INDEPENDENT_AMBULATORY_CARE_PROVIDER_SITE_OTHER): Payer: Medicare Other | Admitting: Family Medicine

## 2017-08-16 DIAGNOSIS — E538 Deficiency of other specified B group vitamins: Secondary | ICD-10-CM | POA: Diagnosis not present

## 2017-08-16 MED ORDER — CYANOCOBALAMIN 1000 MCG/ML IJ SOLN
1000.0000 ug | Freq: Once | INTRAMUSCULAR | Status: AC
  Administered 2017-08-16: 1000 ug via INTRAMUSCULAR

## 2017-08-16 NOTE — Progress Notes (Signed)
I have reviewed and agree with note, evaluation, plan.   Archimedes Harold, MD  

## 2017-08-16 NOTE — Progress Notes (Signed)
Pt presented to the office for monthly B12 injection. Pt tolerated well. Pt knows to return in one month for next injection.

## 2017-08-18 DIAGNOSIS — D225 Melanocytic nevi of trunk: Secondary | ICD-10-CM | POA: Diagnosis not present

## 2017-08-18 DIAGNOSIS — D1801 Hemangioma of skin and subcutaneous tissue: Secondary | ICD-10-CM | POA: Diagnosis not present

## 2017-08-18 DIAGNOSIS — Z85820 Personal history of malignant melanoma of skin: Secondary | ICD-10-CM | POA: Diagnosis not present

## 2017-08-18 DIAGNOSIS — L814 Other melanin hyperpigmentation: Secondary | ICD-10-CM | POA: Diagnosis not present

## 2017-08-18 DIAGNOSIS — Z85828 Personal history of other malignant neoplasm of skin: Secondary | ICD-10-CM | POA: Diagnosis not present

## 2017-08-18 DIAGNOSIS — C44712 Basal cell carcinoma of skin of right lower limb, including hip: Secondary | ICD-10-CM | POA: Diagnosis not present

## 2017-08-18 DIAGNOSIS — I788 Other diseases of capillaries: Secondary | ICD-10-CM | POA: Diagnosis not present

## 2017-08-18 DIAGNOSIS — C44719 Basal cell carcinoma of skin of left lower limb, including hip: Secondary | ICD-10-CM | POA: Diagnosis not present

## 2017-08-20 ENCOUNTER — Encounter: Payer: Self-pay | Admitting: Neurology

## 2017-08-20 ENCOUNTER — Telehealth: Payer: Self-pay

## 2017-08-20 ENCOUNTER — Other Ambulatory Visit (INDEPENDENT_AMBULATORY_CARE_PROVIDER_SITE_OTHER): Payer: Medicare Other

## 2017-08-20 ENCOUNTER — Ambulatory Visit (INDEPENDENT_AMBULATORY_CARE_PROVIDER_SITE_OTHER): Payer: Medicare Other | Admitting: Neurology

## 2017-08-20 VITALS — BP 120/80 | HR 68 | Ht 64.0 in | Wt 204.0 lb

## 2017-08-20 DIAGNOSIS — E538 Deficiency of other specified B group vitamins: Secondary | ICD-10-CM

## 2017-08-20 LAB — VITAMIN B12: Vitamin B-12: 1363 pg/mL — ABNORMAL HIGH (ref 211–911)

## 2017-08-20 NOTE — Patient Instructions (Addendum)
Check vitamin B12 level today  Start vitamin B12 1059mcg daily   Return to clinic as needed

## 2017-08-20 NOTE — Telephone Encounter (Signed)
Spoke with pt relaying message below.  She had recently purchased a bottle OTC of B12, confirmed that what she has on hand is 1090mcg.

## 2017-08-20 NOTE — Progress Notes (Signed)
Follow-up Visit   Date: 08/20/17    Erin Kirk MRN: [id number] DOB: [birth year]   Interim History: Erin Kirk is a 61 y.o. right-handed Caucasian female with hypercoagulable state with history of PE on coumadin, hypertension, history of melanoma involving cervical lymph nodes (2005), hypothyroidism, and diabetes mellitus (5.2) returning to the clinic for follow-up of paresthesias of the hands and fee due to vitamin B12 deficiency.  The patient was accompanied to the clinic by self.  History of present illness: She was taking metformin in the fall 2017 and developed diarrhea and two weeks later, she developed numbness of the fingers and hands bilaterally.  She was able to move her fingers, but felt as if her entire hand was swollen and had no sensation.  She stopped driving because she was unable to feet the steering wheel.  She also complains of numbness and tingling of the feet.  She was found to have vitamin B12 deficiency and has been taking weekly injections.  Her supplementation has helped because her pain is not as intense and she has enough sensation where she is able to drive.  She reports having cold hands and throbbing pain of her arms. Sometimes, her hands get swollen and purple discoloration. She denies any imbalance or falls.  She endorses some weakness of the hands with grip.   UPDATE 02/11/2017:  She is here for follow-up appointment for paresthesias of the hands and feet.  Since February, she reports 50-60% improvement with respect to tingling of the feet since being on vitamin B12 injections.  However, the numbness/tingling in her hands continues to persist, maybe slight improvement but she was expecting greater return of sensation by now.  She denies any worsening or new weakness.  She no longer notices swelling or discoloration of the hands either.  Her NCS/EMG of the left arm and leg was normal.  She is concerned about her weight gain and has many questions regarding  this.   UPDATE 08/20/2017:  She is here for 6 month follow-up.  Her numbness and tingling markedly improved around late summer. She no longer has any tingling in the feet and only rarely has tingling for a few minutes when she over exerts herself, about 1-2 times per month.  Overall, she is doing much better and has no new complaints.    Medications:  Current Outpatient Medications on File Prior to Visit  Medication Sig Dispense Refill  . acetaminophen (TYLENOL) 325 MG tablet Take 650 mg by mouth every 6 (six) hours as needed for mild pain or fever. Reported on 01/13/2016    . diltiazem (CARDIZEM CD) 360 MG 24 hr capsule TAKE ONE CAPSULE BY MOUTH ONCE DAILY 90 capsule 3  . irbesartan (AVAPRO) 150 MG tablet Take 1 tablet (150 mg total) by mouth daily. 30 tablet 11  . levothyroxine (SYNTHROID, LEVOTHROID) 100 MCG tablet TAKE ONE TABLET BY MOUTH ONCE DAILY 90 tablet 2  . metoprolol succinate (TOPROL-XL) 25 MG 24 hr tablet TAKE 1 TABLET BY MOUTH ONCE DAILY 90 tablet 1  . ranitidine (ZANTAC) 150 MG tablet TAKE ONE TABLET BY MOUTH TWICE DAILY 60 tablet 11  . triamterene-hydrochlorothiazide (MAXZIDE-25) 37.5-25 MG tablet TAKE ONE TABLET BY MOUTH ONCE DAILY. 90 tablet 3  . Vitamin D, Ergocalciferol, (DRISDOL) 50000 units CAPS capsule TAKE 1 CAPSULE BY MOUTH ONCE A WEEK 12 capsule 17  . warfarin (COUMADIN) 5 MG tablet TAKE AS DIRECTED BY MOUTH BY ANTICOAGULATION  CLINIC 40 tablet 5   No  current facility-administered medications on file prior to visit.     Allergies:  Allergies  Allergen Reactions  . Iohexol Anaphylaxis, Hives and Shortness Of Breath    Incident 1990, premedicated prior to obtaining IV contrast since.  . Amoxicillin-Pot Clavulanate Swelling and Other (See Comments)    Facial swelling  . Other     Contrast dye    Review of Systems:  CONSTITUTIONAL: No fevers, chills, night sweats, or weight loss.  EYES: No visual changes or eye pain ENT: No hearing changes.  No history of nose  bleeds.   RESPIRATORY: No cough, wheezing and shortness of breath.   CARDIOVASCULAR: Negative for chest pain, and palpitations.   GI: Negative for abdominal discomfort, blood in stools or black stools.  No recent change in bowel habits.   GU:  No history of incontinence.   MUSCLOSKELETAL: No history of joint pain or swelling.  No myalgias.   SKIN: Negative for lesions, rash, and itching.   ENDOCRINE: Negative for cold or heat intolerance, polydipsia or goiter.   PSYCH:  No depression or anxiety symptoms.   NEURO: As Above.   Vital Signs:  BP 120/80   Pulse 68   Ht _0  (1.626 m)   Wt 204 lb (92.5 kg)   SpO2 97%   BMI 35.02 kg/m   General:  Well appearing  Neurological Exam: MENTAL STATUS including orientation to time, place, person, recent and remote memory, attention span and concentration, language, and fund of knowledge is normal.  Speech is not dysarthric.  CRANIAL NERVES:  Face is symmetric.  MOTOR:  Motor strength is 5/5 throughout.  Tone is normal  MSRs:  Reflexes are brisk and symmetric 2+/4 throughout  SENSORY:  Intact to vibration and temperature throughout.  COORDINATION/GAIT:   Gait narrow based and stable.   Data: NCS/EMG of the left arm and leg 11/19/2016: This is a normal study.  In particular, there is no evidence of a generalized sensorimotor polyneuropathy, cervical/lumbosacral radiculopathy, or carpal tunnel syndrome affecting the left side.  Labs 08/28/2016:  Vitamin B12 109** Labs 11/16/2016:  Heavy metal screen neg, vitamin B6 11.7, vitamin B1 14, folate 23.4, ceruloplasmin 25, copper 97, MMA 223, ESR 5  IMPRESSION/PLAN: Paresthesias of the hands and feet due to vitamin B12 deficiency.  NCS/EMG of the arm and leg was normal.  Clinically, paresthesias have essentially resolved.  I will check citamin B12 level, and if levels are normal, transition her to oral vitamin B12 1026mg daily.  Return to clinic as needed.  Greater than 50% of this 15 minute  visit was spent in counseling, explanation of diagnosis, planning of further management, and coordination of care.   Thank you for allowing me to participate in patient's care.  If I can answer any additional questions, I would be pleased to do so.    Sincerely,    Zanna Hawn K. PPosey Pronto DO

## 2017-08-20 NOTE — Telephone Encounter (Signed)
-----   Message from Alda Berthold, DO sent at 08/20/2017 12:58 PM EST ----- Please inform patient that her vitamin B12 level looks great, okay to start OTC vitamin B12 1012mcg daily.  Thanks.

## 2017-08-25 ENCOUNTER — Telehealth: Payer: Self-pay | Admitting: Family Medicine

## 2017-08-25 NOTE — Telephone Encounter (Signed)
Copied from [place] [id number]. Topic: Quick Communication - See Telephone Encounter >> Aug 25, 2017  9:03 AM Ether Griffins B wrote: CRM for notification. See Telephone encounter for:  Pt taking ranitidine 150 mg and insurance wont cover the 150 mg. Pt has been taking both 150 at night.  Pt was previously prescribed the 300 mg and insurance covered it. Pt would like to be switched back to the 300 mg  08/25/17.

## 2017-08-25 NOTE — Telephone Encounter (Signed)
You can place her back on 300 mg once daily before dinner of Zantac or ranitidine

## 2017-08-26 ENCOUNTER — Other Ambulatory Visit: Payer: Self-pay | Admitting: Family Medicine

## 2017-08-26 ENCOUNTER — Other Ambulatory Visit: Payer: Self-pay

## 2017-08-26 MED ORDER — RANITIDINE HCL 300 MG PO CAPS: 300.0000 mg | ORAL_CAPSULE | Freq: Every evening | ORAL | 1 refills | Status: DC

## 2017-08-26 NOTE — Telephone Encounter (Signed)
Prescription for 300mg  ranitidine sent to pharmacy

## 2017-09-03 ENCOUNTER — Ambulatory Visit: Payer: Medicare Other

## 2017-09-08 ENCOUNTER — Ambulatory Visit (INDEPENDENT_AMBULATORY_CARE_PROVIDER_SITE_OTHER): Payer: Medicare Other | Admitting: General Practice

## 2017-09-08 DIAGNOSIS — Z7901 Long term (current) use of anticoagulants: Secondary | ICD-10-CM

## 2017-09-08 DIAGNOSIS — D6859 Other primary thrombophilia: Secondary | ICD-10-CM | POA: Diagnosis not present

## 2017-09-08 LAB — POCT INR: INR: 3

## 2017-09-08 NOTE — Progress Notes (Signed)
I have reviewed and agree with note, evaluation, plan.   Stephen Hunter, MD  

## 2017-09-08 NOTE — Patient Instructions (Addendum)
.  lbpcmh  Continue taking 1 tablet daily.  Re-check in 6 weeks.

## 2017-09-15 ENCOUNTER — Ambulatory Visit: Payer: Medicare Other

## 2017-09-21 DIAGNOSIS — J189 Pneumonia, unspecified organism: Secondary | ICD-10-CM

## 2017-09-21 HISTORY — DX: Pneumonia, unspecified organism: J18.9

## 2017-09-28 ENCOUNTER — Ambulatory Visit: Payer: Self-pay | Admitting: Family Medicine

## 2017-09-30 DIAGNOSIS — C44719 Basal cell carcinoma of skin of left lower limb, including hip: Secondary | ICD-10-CM | POA: Diagnosis not present

## 2017-09-30 DIAGNOSIS — Z85820 Personal history of malignant melanoma of skin: Secondary | ICD-10-CM | POA: Diagnosis not present

## 2017-09-30 DIAGNOSIS — C44712 Basal cell carcinoma of skin of right lower limb, including hip: Secondary | ICD-10-CM | POA: Diagnosis not present

## 2017-10-20 ENCOUNTER — Ambulatory Visit (INDEPENDENT_AMBULATORY_CARE_PROVIDER_SITE_OTHER): Payer: Medicare Other | Admitting: General Practice

## 2017-10-20 ENCOUNTER — Ambulatory Visit: Payer: Medicare Other

## 2017-10-20 DIAGNOSIS — Z7901 Long term (current) use of anticoagulants: Secondary | ICD-10-CM | POA: Diagnosis not present

## 2017-10-20 DIAGNOSIS — D6859 Other primary thrombophilia: Secondary | ICD-10-CM | POA: Diagnosis not present

## 2017-10-20 LAB — POCT INR: INR: 2.8

## 2017-10-20 NOTE — Progress Notes (Signed)
I have reviewed and agree with this plan  

## 2017-10-20 NOTE — Patient Instructions (Addendum)
Pre visit review using our clinic review tool, if applicable. No additional management support is needed unless otherwise documented below in the visit note. ? ?Continue taking 1 tablet daily.  Re-check in 6 weeks. ?

## 2017-10-22 ENCOUNTER — Telehealth: Payer: Self-pay | Admitting: Family Medicine

## 2017-10-22 NOTE — Telephone Encounter (Signed)
Please advise.   Copied from [place]. Topic: General - Other >> Oct 22, 2017  9:09 AM Erin Kirk wrote:  Pt call to say the [facility]  has the below on med on recall and does not know when it will be available. Pt said in her last visit with [name] they had discuss her not taking it she is asking if he want to replace it with something else or just wait until she see him on Feb 19 to discuss. She would like a call back and message can be left on answering machine [phone]        irbesartan (AVAPRO) 150 MG tablet

## 2017-10-23 NOTE — Telephone Encounter (Signed)
Does she have enough to get to the visit? The message I am getting from her pharmacy is that it is on backorder- not recall.   Which ARBs does the pharmacy have that are not on backorder or recall?

## 2017-10-23 NOTE — Telephone Encounter (Signed)
Please see message and advise 

## 2017-10-25 NOTE — Telephone Encounter (Signed)
Roselyn Reef did some detective work and they will cover telmisartan or losartan  im ok with her holding the avapro until the visit- would be great if she could check some blood pressures at home and bring those home readings to visit- if she is running higher than perhaps 150/90- let us know and I can send something in prior to visit

## 2017-10-25 NOTE — Telephone Encounter (Signed)
Dr. Yong Channel pt would like to know if she can stay off Avapro until she sees you on the 19th? She has been off since last Wednesday and is not having a problem. Pt says she can tell when her blood pressure is high and she has been fine. Pt denies headaches. Please advise.

## 2017-10-25 NOTE — Telephone Encounter (Signed)
Spoke to pt, told her Dr. Yong Channel is okay with you holding the avapro until the visit- would be great if you could check some blood pressures at home and bring those home readings to visit- if you are  running higher than 150/90 please let us know and he can send something in prior to visit. Pt verbalized understanding.

## 2017-10-25 NOTE — Telephone Encounter (Signed)
Spoke to pt, asked her if she had enough medication till she sees Dr. Yong Channel on the 19th? Pt said no she has been out since last Wed. Asked pt if she knows what is replacing the Avapro, idd the pharmacist tell you? Pt said no just was told not available due to one of the ingredients and will probable be recalled and does not know when it will be available. Told her okay need to find out from pharmacy what is available to replace it so Dr. Yong Channel can order it for you. Pt verbalized understanding and wants to know if she can just stay off it till appt she is not having any problems. Told pt I am not sure will send Dr. Yong Channel a message and meanwhile find out from pharmacist what else she can take. Pt verbalized understanding.

## 2017-11-09 ENCOUNTER — Encounter: Payer: Self-pay | Admitting: Family Medicine

## 2017-11-09 ENCOUNTER — Ambulatory Visit (INDEPENDENT_AMBULATORY_CARE_PROVIDER_SITE_OTHER): Payer: Medicare Other | Admitting: Family Medicine

## 2017-11-09 VITALS — BP 138/70 | HR 73 | Temp 97.9°F | Ht 64.0 in | Wt 205.2 lb

## 2017-11-09 DIAGNOSIS — G63 Polyneuropathy in diseases classified elsewhere: Secondary | ICD-10-CM | POA: Insufficient documentation

## 2017-11-09 DIAGNOSIS — I10 Essential (primary) hypertension: Secondary | ICD-10-CM | POA: Diagnosis not present

## 2017-11-09 DIAGNOSIS — E039 Hypothyroidism, unspecified: Secondary | ICD-10-CM | POA: Diagnosis not present

## 2017-11-09 DIAGNOSIS — E119 Type 2 diabetes mellitus without complications: Secondary | ICD-10-CM | POA: Diagnosis not present

## 2017-11-09 DIAGNOSIS — D6859 Other primary thrombophilia: Secondary | ICD-10-CM

## 2017-11-09 DIAGNOSIS — E538 Deficiency of other specified B group vitamins: Secondary | ICD-10-CM | POA: Diagnosis not present

## 2017-11-09 DIAGNOSIS — I480 Paroxysmal atrial fibrillation: Secondary | ICD-10-CM | POA: Diagnosis not present

## 2017-11-09 LAB — BASIC METABOLIC PANEL
BUN: 16 mg/dL (ref 6–23)
CALCIUM: 9.3 mg/dL (ref 8.4–10.5)
CO2: 30 mEq/L (ref 19–32)
CREATININE: 1.21 mg/dL — AB (ref 0.40–1.20)
Chloride: 103 mEq/L (ref 96–112)
GFR: 47.65 mL/min — AB (ref >60.00)
GLUCOSE: 110 mg/dL — AB (ref 70–99)
Potassium: 3.7 mEq/L (ref 3.5–5.1)
SODIUM: 139 meq/L (ref 135–145)

## 2017-11-09 LAB — TSH: TSH: 1.62 u[IU]/mL (ref 0.35–4.50)

## 2017-11-09 LAB — HEMOGLOBIN A1C: HEMOGLOBIN A1C: 6.5 % (ref 4.6–6.5)

## 2017-11-09 LAB — VITAMIN B12: Vitamin B-12: >1500 pg/mL — ABNORMAL HIGH (ref 211–911)

## 2017-11-09 NOTE — Assessment & Plan Note (Signed)
S: needs updated disability paperwork. 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

## 2017-11-09 NOTE — Assessment & Plan Note (Signed)
S: patient remains rate controlled on diltiazem and metoprolol when in a fib but is paroxysmal. Anticoagulated with coumadin.   On Sunday did have a spell of this- period of palpitations- BP machine told her irregular HR.  A/P: continue current medicine- appears in sinus today (although cannot prove without EKG)

## 2017-11-09 NOTE — Patient Instructions (Signed)
Please stop by lab before you go  No changes today   

## 2017-11-09 NOTE — Assessment & Plan Note (Signed)
S: well controlled on last check. She does not want to be on metformin given how it may have affected her b12.  Has not lost weight but is planning to start doing tredmill or exercise bike with her friend in about a week.  Lab Results  Component Value Date   HGBA1C 6.4 05/27/2017   HGBA1C 6.2 12/23/2016   HGBA1C 5.2 05/14/2016   A/P: consider glimepiride if a1c above 7 - needs updated eye exam- encouraged yet again today

## 2017-11-09 NOTE — Assessment & Plan Note (Signed)
S: controlled on  maxzide 25mg , metoprolol 25mg  XR, diltiazem 360mg  XR. Home readings average well below 130/90 though has sparing check above systolic or diastolic goal- usually one or the other isolated.   She is off her irbesartan 150mg  - as is on backorder BP Readings from Last 3 Encounters:  11/09/17 138/70  08/20/17 120/80  05/27/17 120/72  A/P: We discussed blood pressure goal of <140/90. Continue current meds- controlled at home and in office. BP is about 18 points lower with irbesartan and we discussed would restart this when off backorder.

## 2017-11-09 NOTE — Assessment & Plan Note (Signed)
Lingers after low b12- manageable now

## 2017-11-09 NOTE — Assessment & Plan Note (Signed)
S: Lab Results  Component Value Date   TSH 1.69 05/27/2017   On thyroid medication-levothyroxine 176mcg A/P: update tsh

## 2017-11-09 NOTE — Assessment & Plan Note (Signed)
S: patient is continuing monthly b12 injections- has been in normal range. Has numbness and pain in both hands but improved overall- will likely have some lingering low level discomfort per neurology Lab Results  Component Value Date   VITAMINB12 1,363 (H) 08/20/2017  A/P: Erin Kirk transitioned to oral b12 in December- we will recheck b12 today with transition

## 2017-11-09 NOTE — Progress Notes (Signed)
Subjective:  Erin Kirk is a 62 y.o. year old very pleasant female patient who presents for/with See problem oriented charting ROS- No chest pain or shortness of breath. No headache or blurry vision.    Past Medical History-  Patient Active Problem List   Diagnosis Date Noted  . Low vitamin B12 level 09/07/2016    Priority: High  . Disability examination 01/17/2015    Priority: High  . Type II diabetes mellitus, well controlled ([place]) 11/28/2010    Priority: High  . PAROXYSMAL ATRIAL FIBRILLATION 07/02/2010    Priority: High  . Primary hypercoagulable state ([place]) 11/30/2007    Priority: High  . Neuropathy due to medical condition ([place]) 11/09/2017    Priority: Medium  . Hx of adenomatous polyp of colon 04/10/2016    Priority: Medium  . CKD (chronic kidney disease), stage III ([place]) 09/05/2014    Priority: Medium  . Hypothyroidism 03/09/2007    Priority: Medium  . Essential hypertension 03/09/2007    Priority: Medium  . History of Melanoma 03/09/2007    Priority: Medium  . Encounter for therapeutic drug monitoring 10/26/2013    Priority: Low  . Left facial pain 09/24/2012    Priority: Low  . Oral mucosal lesion 09/24/2012    Priority: Low  . TRANSIENT DISORDER INITIATING/MAINTAINING SLEEP 04/02/2010    Priority: Low  . Overweight(278.02) 11/29/2009    Priority: Low  . LOW BACK PAIN 06/15/2008    Priority: Low  . ALLERGIC RHINITIS 03/09/2007    Priority: Low  . GERD 03/09/2007    Priority: Low  . Long term (current) use of anticoagulants 06/02/2017    Medications- reviewed and updated Current Outpatient Medications  Medication Sig Dispense Refill  . acetaminophen (TYLENOL) 325 MG tablet Take 650 mg by mouth every 6 (six) hours as needed for mild pain or fever. Reported on 01/13/2016    . diltiazem (CARDIZEM CD) 360 MG 24 hr capsule TAKE ONE CAPSULE BY MOUTH ONCE DAILY 90 capsule 3  . irbesartan (AVAPRO) 150 MG tablet Take 1 tablet (150 mg total) by mouth daily. 30  tablet 11  . levothyroxine (SYNTHROID, LEVOTHROID) 100 MCG tablet TAKE ONE TABLET BY MOUTH ONCE DAILY 90 tablet 2  . metoprolol succinate (TOPROL-XL) 25 MG 24 hr tablet TAKE 1 TABLET BY MOUTH ONCE DAILY 90 tablet 1  . ranitidine (ZANTAC) 300 MG capsule Take 1 capsule (300 mg total) by mouth every evening. 90 capsule 1  . triamterene-hydrochlorothiazide (MAXZIDE-25) 37.5-25 MG tablet TAKE ONE TABLET BY MOUTH ONCE DAILY 90 tablet 3  . vitamin B-12 (CYANOCOBALAMIN) 1000 MCG tablet Take 1,000 mcg by mouth daily.    . Vitamin D, Ergocalciferol, (DRISDOL) 50000 units CAPS capsule TAKE 1 CAPSULE BY MOUTH ONCE A WEEK 12 capsule 17  . warfarin (COUMADIN) 5 MG tablet TAKE AS DIRECTED BY MOUTH BY ANTICOAGULATION  CLINIC 40 tablet 5   No current facility-administered medications for this visit.     Objective: BP 138/70   Pulse 73   Temp 97.9 F (36.6 C) (Oral)   Ht 5\' 4"  (1.626 m)   Wt 205 lb 3.2 oz (93.1 kg)   SpO2 97%   BMI 35.22 kg/m  Gen: NAD, resting comfortably CV: RRR no murmurs rubs or gallops Lungs: CTAB no crackles, wheeze, rhonchi Abdomen: soft/nontender/nondistended/normal bowel sounds. No rebound or guarding.  Ext: trace edema Skin: warm, dry  Assessment/Plan:  Type II diabetes mellitus, well controlled ([place]) S: well controlled on last check. She does not want to  be on metformin given how it may have affected her b12.  Has not lost weight but is planning to start doing tredmill or exercise bike with her friend in about a week.  Lab Results  Component Value Date   HGBA1C 6.4 05/27/2017   HGBA1C 6.2 12/23/2016   HGBA1C 5.2 05/14/2016   A/P: consider glimepiride if a1c above 7 - needs updated eye exam- encouraged yet again today  Hypothyroidism S: Lab Results  Component Value Date   TSH 1.69 05/27/2017   On thyroid medication-levothyroxine 121mcg A/P: update tsh  Low vitamin B12 level S: patient is continuing monthly b12 injections- has been in normal range. Has  numbness and pain in both hands but improved overall- will likely have some lingering low level discomfort per neurology Lab Results  Component Value Date   VITAMINB12 1,363 (H) 08/20/2017  A/P: she transitioned to oral b12 in December- we will recheck b12 today with transition  Essential hypertension S: controlled on  maxzide 25mg , metoprolol 25mg  XR, diltiazem 360mg  XR. Home readings average well below 130/90 though has sparing check above systolic or diastolic goal- usually one or the other isolated.   She is off her irbesartan 150mg  - as is on backorder BP Readings from Last 3 Encounters:  11/09/17 138/70  08/20/17 120/80  05/27/17 120/72  A/P: We discussed blood pressure goal of <140/90. Continue current meds- controlled at home and in office. BP is about 18 points lower with irbesartan and we discussed would restart this when off backorder.   PAROXYSMAL ATRIAL FIBRILLATION S: patient remains rate controlled on diltiazem and metoprolol when in a fib but is paroxysmal. Anticoagulated with coumadin.   On Sunday did have a spell of this- period of palpitations- BP machine told her irregular HR.  A/P: continue current medicine- appears in sinus today (although cannot prove without EKG)  Primary hypercoagulable state S: needs updated disability paperwork. 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  Lab/Order associations: Type II diabetes mellitus, well controlled ([place]) - Plan: Hemoglobin Z0Y, Basic metabolic panel  Hypothyroidism, unspecified type - Plan: TSH  Low vitamin  B12 level - Plan: Vitamin B12  Essential hypertension - Plan: Basic metabolic panel  Return precautions advised.  Garret Reddish, MD

## 2017-11-23 ENCOUNTER — Other Ambulatory Visit: Payer: Self-pay

## 2017-11-23 MED ORDER — TELMISARTAN 40 MG PO TABS: 40.0000 mg | ORAL_TABLET | Freq: Every day | ORAL | 3 refills | Status: DC

## 2017-12-01 ENCOUNTER — Ambulatory Visit (INDEPENDENT_AMBULATORY_CARE_PROVIDER_SITE_OTHER): Payer: Medicare Other | Admitting: General Practice

## 2017-12-01 DIAGNOSIS — Z7901 Long term (current) use of anticoagulants: Secondary | ICD-10-CM

## 2017-12-01 DIAGNOSIS — D6859 Other primary thrombophilia: Secondary | ICD-10-CM

## 2017-12-01 LAB — POCT INR: INR: 2.5

## 2017-12-01 NOTE — Progress Notes (Signed)
I have reviewed this visit and I agree on the patient's plan of dosage and recommendations. Santosh Petter, DO   

## 2017-12-01 NOTE — Patient Instructions (Addendum)
Pre visit review using our clinic review tool, if applicable. No additional management support is needed unless otherwise documented below in the visit note. ? ?Continue taking 1 tablet daily.  Re-check in 6 weeks. ?

## 2017-12-21 ENCOUNTER — Other Ambulatory Visit: Payer: Self-pay | Admitting: Family Medicine

## 2017-12-21 ENCOUNTER — Other Ambulatory Visit: Payer: Self-pay

## 2018-01-04 ENCOUNTER — Other Ambulatory Visit: Payer: Self-pay | Admitting: Family Medicine

## 2018-01-12 ENCOUNTER — Ambulatory Visit (INDEPENDENT_AMBULATORY_CARE_PROVIDER_SITE_OTHER): Payer: Medicare Other | Admitting: General Practice

## 2018-01-12 DIAGNOSIS — D6859 Other primary thrombophilia: Secondary | ICD-10-CM | POA: Diagnosis not present

## 2018-01-12 DIAGNOSIS — Z7901 Long term (current) use of anticoagulants: Secondary | ICD-10-CM

## 2018-01-12 LAB — POCT INR: INR: 2.3

## 2018-01-12 NOTE — Progress Notes (Signed)
I have reviewed and agree with note, evaluation, plan.   Aarian Griffie, MD  

## 2018-01-12 NOTE — Patient Instructions (Addendum)
Pre visit review using our clinic review tool, if applicable. No additional management support is needed unless otherwise documented below in the visit note. ? ?Continue taking 1 tablet daily.  Re-check in 6 weeks. ?

## 2018-02-06 ENCOUNTER — Other Ambulatory Visit: Payer: Self-pay | Admitting: Family Medicine

## 2018-02-11 ENCOUNTER — Encounter: Payer: Self-pay | Admitting: Family Medicine

## 2018-02-11 ENCOUNTER — Ambulatory Visit (INDEPENDENT_AMBULATORY_CARE_PROVIDER_SITE_OTHER): Payer: Medicare Other | Admitting: Family Medicine

## 2018-02-11 ENCOUNTER — Ambulatory Visit: Payer: Self-pay | Admitting: *Deleted

## 2018-02-11 ENCOUNTER — Ambulatory Visit (INDEPENDENT_AMBULATORY_CARE_PROVIDER_SITE_OTHER): Payer: Medicare Other

## 2018-02-11 VITALS — BP 124/68 | HR 74 | Temp 98.7°F | Ht 64.0 in | Wt 207.6 lb

## 2018-02-11 DIAGNOSIS — S60141A Contusion of right ring finger with damage to nail, initial encounter: Secondary | ICD-10-CM

## 2018-02-11 DIAGNOSIS — S60131A Contusion of right middle finger with damage to nail, initial encounter: Secondary | ICD-10-CM

## 2018-02-11 DIAGNOSIS — S6010XA Contusion of unspecified finger with damage to nail, initial encounter: Secondary | ICD-10-CM

## 2018-02-11 DIAGNOSIS — M7989 Other specified soft tissue disorders: Secondary | ICD-10-CM | POA: Diagnosis not present

## 2018-02-11 DIAGNOSIS — M79644 Pain in right finger(s): Secondary | ICD-10-CM

## 2018-02-11 NOTE — Progress Notes (Signed)
    Subjective:  Erin Kirk is a 62 y.o. female who presents today for same-day appointment with a chief complaint of finger pain.   HPI:  Finger Pain, acute problem Patient suffered finger injury 2 days ago. Patient was pulling down her garage door when it slammed on her third and 4th digit on the right hand. Since then, she has had worsened pain and pressure. She has tried ice which has not helped significantly. She has noticed dark discoloration to the nails of both her third and fourth fingers.   ROS: Per HPI  PMH: She reports that she has never smoked. She has never used smokeless tobacco. She reports that she does not drink alcohol or use drugs.  Objective:  Physical Exam: BP 124/68 (BP Location: Right Arm, Patient Position: Sitting, Cuff Size: Normal)   Pulse 74   Temp 98.7 F (37.1 C) (Oral)   Ht 5\' 4"  (1.626 m)   Wt 207 lb 9.6 oz (94.2 kg)   SpO2 98%   BMI 35.63 kg/m   Gen: NAD, resting comfortably MSK:  -Right hand: Subungal hematoma noted to third and fourth digits. Cap refill intact distally. Sensation to light touch intact distally. Please see below picture:      Nail Trephination Procedure Note: Risks, benefits, and alternatives for treatment were discussed.  Written consent was obtained.  Both fingernails were prepped with Betadine solution.  An 18-gauge needle was then placed on the nailbed superior to the hematoma. A gentle back-and-forth motion was then used to drill a small hole in the nail bed. A total of 3 holes were made in the 4th digit nail bed and 1 hole was made in the third digit nail bed.  Serosanguineous fluid as well as blood was obtained from both nailbeds.  Patient reported relief from pressure sensation.  She tolerated procedure well without complication.  Estimate blood loss 3 cc.   Assessment/Plan:  Subungual Hematoma Trephination performed today on both nailbeds.  Please see above procedure note.  Patient tolerated well.  She is  neurovascularly intact distally and does not have any signs of neurovascular compromise.  X-ray was performed without obvious fracture based on my read.  We will await radiology read.  Offered prescription pain medication however patient declined.  She will continue using OTC Tylenol as needed.  Discussed care after and instructed patient to keep trephination site clean and dry for the next 24 to 48 hours.  Discussed reasons to return to care including worsening pain, redness, or purulent drainage.  Follow-up as needed.  Algis Greenhouse. Jerline Pain, MD 02/11/2018 4:31 PM

## 2018-02-11 NOTE — Telephone Encounter (Signed)
Patient slammed tips of two fingers in the garage door. She reports no break in skin but has bruising, swelling and pressure is the worst for her. She is on coumadin 5 MG daily. [employer], appointment with Dr. Jerline Pain at 4:20p.  Reason for Disposition . [1] MODERATE-SEVERE pain AND [2] blood present under a nail  Answer Assessment - Initial Assessment Questions 1. MECHANISM: "How did the injury happen?"      Slammed tips of 2 fingers in the garage door.   2. ONSET: "When did the injury happen?" (Minutes or hours ago)      Wednesday night 3. LOCATION: "What part of the finger is injured?" "Is the nail damaged?"      Tips including nail beds. 4. APPEARANCE of the INJURY: "What does the injury look like?"      bruised, swollen with pressure under nailbed. No skin broken 5. SEVERITY: "Can you use the hand normally?"  "Can you bend your fingers into a ball and then fully open them?"     No, very painful. 6. SIZE: For cuts, bruises, or swelling, ask: "How large is it?" (e.g., inches or centimeters;  entire finger)      Finger tips. 7. PAIN: "Is there pain?" If so, ask: "How bad is the pain?"    (e.g., Scale 1-10; or mild, moderate, severe)     Yes, especially pressure. 8. TETANUS: For any breaks in the skin, ask: "When was the last tetanus booster?"     No skin breaks. 9. OTHER SYMPTOMS: "Do you have any other symptoms?"     no 10. PREGNANCY: "Is there any chance you are pregnant?" "When was your last menstrual period?"       no  Protocols used: FINGER INJURY-A-AH

## 2018-02-11 NOTE — Patient Instructions (Signed)
It was nice to see you today.   We placed a few small holes in your nail beds to allow drainage of blood and fluid.  Please keep the nails clean and dry for the next 24-48 hours.  Let me know if you have worsening pain, redness, or drainage from the area.  You can continue using tylenol as needed for pain.   Take care, Dr Jerline Pain

## 2018-02-11 NOTE — Progress Notes (Signed)
Consent/thx dmf

## 2018-02-23 ENCOUNTER — Ambulatory Visit (INDEPENDENT_AMBULATORY_CARE_PROVIDER_SITE_OTHER): Payer: Medicare Other | Admitting: General Practice

## 2018-02-23 DIAGNOSIS — Z7901 Long term (current) use of anticoagulants: Secondary | ICD-10-CM | POA: Diagnosis not present

## 2018-02-23 DIAGNOSIS — D6859 Other primary thrombophilia: Secondary | ICD-10-CM

## 2018-02-23 LAB — POCT INR: INR: 3 (ref 2.0–3.0)

## 2018-02-23 NOTE — Patient Instructions (Addendum)
Pre visit review using our clinic review tool, if applicable. No additional management support is needed unless otherwise documented below in the visit note.  Continue taking 1 tablet daily except 1 1/2 tablets on Wednesdays.  Re-check in 6 weeks.   

## 2018-02-24 NOTE — Progress Notes (Signed)
I have reviewed and agree with note, evaluation, plan.   Stephen Hunter, MD  

## 2018-03-07 ENCOUNTER — Other Ambulatory Visit: Payer: Self-pay | Admitting: Family Medicine

## 2018-03-10 ENCOUNTER — Ambulatory Visit: Payer: Medicare Other | Admitting: Family Medicine

## 2018-03-13 ENCOUNTER — Other Ambulatory Visit: Payer: Self-pay | Admitting: Family Medicine

## 2018-03-14 ENCOUNTER — Other Ambulatory Visit: Payer: Self-pay | Admitting: General Practice

## 2018-03-14 MED ORDER — WARFARIN SODIUM 5 MG PO TABS: ORAL_TABLET | ORAL | 3 refills | Status: DC

## 2018-03-18 ENCOUNTER — Encounter: Payer: Self-pay | Admitting: Family Medicine

## 2018-03-18 ENCOUNTER — Ambulatory Visit (INDEPENDENT_AMBULATORY_CARE_PROVIDER_SITE_OTHER): Payer: Medicare Other | Admitting: Family Medicine

## 2018-03-18 VITALS — BP 134/84 | HR 87 | Temp 98.4°F | Ht 64.0 in | Wt 208.6 lb

## 2018-03-18 DIAGNOSIS — I10 Essential (primary) hypertension: Secondary | ICD-10-CM | POA: Diagnosis not present

## 2018-03-18 DIAGNOSIS — E538 Deficiency of other specified B group vitamins: Secondary | ICD-10-CM | POA: Diagnosis not present

## 2018-03-18 DIAGNOSIS — E039 Hypothyroidism, unspecified: Secondary | ICD-10-CM | POA: Diagnosis not present

## 2018-03-18 DIAGNOSIS — E119 Type 2 diabetes mellitus without complications: Secondary | ICD-10-CM | POA: Diagnosis not present

## 2018-03-18 DIAGNOSIS — I480 Paroxysmal atrial fibrillation: Secondary | ICD-10-CM | POA: Diagnosis not present

## 2018-03-18 LAB — POCT GLYCOSYLATED HEMOGLOBIN (HGB A1C): HEMOGLOBIN A1C: 6.2 % — AB (ref 4.0–5.6)

## 2018-03-18 NOTE — Assessment & Plan Note (Signed)
S: well controlled previously. Off metformin now due to b12 risks. Weight now up 3 lbs over last 4 months- needs to reverse trend to stay off meds. May need glimepiride 2mg  (due to cost)  Got started on bike recently but got off track - encouraged her to get back to taking care of herself. Has not done as well on food choices. Struggles to eat breakfast- wants to get back to 3 meals Lab Results  Component Value Date   HGBA1C 6.5 11/09/2017   HGBA1C 6.4 05/27/2017   HGBA1C 6.2 12/23/2016   A/P: a1c stable at 6.2 thankfully- we did still discuss healthy eating/regular exercise to help keep #s down

## 2018-03-18 NOTE — Progress Notes (Signed)
Subjective:  Erin Kirk is a 62 y.o. year old very pleasant female patient who presents for/with See problem oriented charting ROS-  some knee pain. No chest pain or shortness of breath. No headache or blurry vision.   Past Medical History-  Patient Active Problem List   Diagnosis Date Noted  . Low vitamin B12 level 09/07/2016    Priority: High  . Disability examination 01/17/2015    Priority: High  . Type II diabetes mellitus, well controlled ([place]) 11/28/2010    Priority: High  . PAROXYSMAL ATRIAL FIBRILLATION 07/02/2010    Priority: High  . Primary hypercoagulable state ([place]) 11/30/2007    Priority: High  . Neuropathy due to medical condition ([place]) 11/09/2017    Priority: Medium  . Hx of adenomatous polyp of colon 04/10/2016    Priority: Medium  . CKD (chronic kidney disease), stage III ([place]) 09/05/2014    Priority: Medium  . Hypothyroidism 03/09/2007    Priority: Medium  . Essential hypertension 03/09/2007    Priority: Medium  . History of Melanoma 03/09/2007    Priority: Medium  . Encounter for therapeutic drug monitoring 10/26/2013    Priority: Low  . Left facial pain 09/24/2012    Priority: Low  . Oral mucosal lesion 09/24/2012    Priority: Low  . TRANSIENT DISORDER INITIATING/MAINTAINING SLEEP 04/02/2010    Priority: Low  . Overweight(278.02) 11/29/2009    Priority: Low  . LOW BACK PAIN 06/15/2008    Priority: Low  . ALLERGIC RHINITIS 03/09/2007    Priority: Low  . GERD 03/09/2007    Priority: Low  . Morbid obesity ([place]) 03/18/2018  . Long term (current) use of anticoagulants 06/02/2017    Medications- reviewed and updated Current Outpatient Medications  Medication Sig Dispense Refill  . acetaminophen (TYLENOL) 325 MG tablet Take 650 mg by mouth every 6 (six) hours as needed for mild pain or fever. Reported on 01/13/2016    . diltiazem (CARDIZEM CD) 360 MG 24 hr capsule TAKE 1 CAPSULE BY MOUTH ONCE DAILY 90 capsule 3  . levothyroxine (SYNTHROID,  LEVOTHROID) 100 MCG tablet TAKE 1 TABLET BY MOUTH ONCE DAILY 90 tablet 2  . metoprolol succinate (TOPROL-XL) 25 MG 24 hr tablet TAKE 1 TABLET BY MOUTH ONCE DAILY 90 tablet 1  . ranitidine (ZANTAC) 300 MG capsule Take 1 capsule (300 mg total) by mouth every evening. 90 capsule 1  . telmisartan (MICARDIS) 40 MG tablet Take 1 tablet (40 mg total) by mouth daily. 90 tablet 3  . triamterene-hydrochlorothiazide (MAXZIDE-25) 37.5-25 MG tablet TAKE ONE TABLET BY MOUTH ONCE DAILY 90 tablet 3  . vitamin B-12 (CYANOCOBALAMIN) 1000 MCG tablet Take 1,000 mcg by mouth daily.    . Vitamin D, Ergocalciferol, (DRISDOL) 50000 units CAPS capsule TAKE 1 CAPSULE BY MOUTH ONCE A WEEK 12 capsule 17  . warfarin (COUMADIN) 5 MG tablet TAKE AS DIRECTED BY MOUTH BY ANTICOAGULATION  CLINIC 35 tablet 3   No current facility-administered medications for this visit.     Objective: BP 134/84 (BP Location: Left Arm, Patient Position: Sitting, Cuff Size: Large)   Pulse 87   Temp 98.4 F (36.9 C) (Oral)   Ht 5\' 4"  (1.626 m)   Wt 208 lb 9.6 oz (94.6 kg)   SpO2 98%   BMI 35.81 kg/m  Gen: NAD, resting comfortably CV: RRR no murmurs rubs or gallops Lungs: CTAB no crackles, wheeze, rhonchi Abdomen: soft/nontender/nondistended/normal bowel sounds. obese  Ext: no edema Skin: warm, dry  Diabetic Foot Exam -  Simple   Simple Foot Form Diabetic Foot exam was performed with the following findings:  Yes 03/18/2018  9:24 AM  Visual Inspection No deformities, no ulcerations, no other skin breakdown bilaterally:  Yes Sensation Testing Intact to touch and monofilament testing bilaterally:  Yes Pulse Check Posterior Tibialis and Dorsalis pulse intact bilaterally:  Yes Comments    Assessment/Plan:  Other notes: 1.some knee pain recently- better with icing   Hypothyroidism S: On thyroid medication-levothyroxine 100 mcg  Lab Results  Component Value Date   TSH 1.62 11/09/2017  A/P: continue current rx  Type II diabetes  mellitus, well controlled ([place]) S: well controlled previously. Off metformin now due to b12 risks. Weight now up 3 lbs over last 4 months- needs to reverse trend to stay off meds. May need glimepiride 2mg  (due to cost)  Got started on bike recently but got off track - encouraged her to get back to taking care of herself. Has not done as well on food choices. Struggles to eat breakfast- wants to get back to 3 meals Lab Results  Component Value Date   HGBA1C 6.5 11/09/2017   HGBA1C 6.4 05/27/2017   HGBA1C 6.2 12/23/2016   A/P: a1c stable at 6.2 thankfully- we did still discuss healthy eating/regular exercise to help keep #s down  Low vitamin B12 level S: she transitioned to oral b12 in December and b12 actually looked better than when on shots- continue oral b12. Lingering neuropathy issues- seems to be slowly getting better- now just on and off A/P: update b12 next visit  Essential hypertension S: controlled on maxzide 25mg , metoprolol 25mg  Xr, diltiazem 360mg  XR, telmisartan 40mg   BP was better on irbesartan but still on back order- will keep same regimen for now unless BP increases BP Readings from Last 3 Encounters:  03/18/18 134/84  02/11/18 124/68  11/09/17 138/70  A/P: We discussed blood pressure goal of <140/90. Continue current meds  PAROXYSMAL ATRIAL FIBRILLATION S:  anticoagulated with coumadin. Rate controlled on diltiazem and metoprolol. Paroxysmal.  A/P: continue current rx  Morbid obesity (HCC) BMI >35 with HTN, DM. We discussed weight loss of even 5-10 lbs by next visit would be a great step. Discussed ways to achieve this.    Future Appointments  Date Time Provider [place]  04/06/2018 10:00 AM [facility]   Return in about 4 months (around 07/18/2018) for follow up- come fasting and update full lipids.  Lab/Order associations: Type II diabetes mellitus, well controlled ([place]) - Plan: POCT glycosylated hemoglobin (Hb  A1C)  Hypothyroidism, unspecified type  Low vitamin B12 level  Essential hypertension  Paroxysmal atrial fibrillation (HCC)  Morbid obesity ([place])  Return precautions advised.  Garret Reddish, MD

## 2018-03-18 NOTE — Patient Instructions (Addendum)
Health Maintenance Due  Topic Date Due  . TETANUS/TDAP -get at the pharmacy and let us know so we can update your chart 09/21/2013  . OPHTHALMOLOGY EXAM -call and schedule 12/02/2016  . FOOT EXAM -completed today 12/23/2017   No changes today

## 2018-03-18 NOTE — Assessment & Plan Note (Signed)
S:  anticoagulated with coumadin. Rate controlled on diltiazem and metoprolol. Paroxysmal.  A/P: continue current rx

## 2018-03-18 NOTE — Assessment & Plan Note (Signed)
BMI >35 with HTN, DM. We discussed weight loss of even 5-10 lbs by next visit would be a great step. Discussed ways to achieve this.

## 2018-03-18 NOTE — Assessment & Plan Note (Signed)
S: controlled on maxzide 25mg , metoprolol 25mg  Xr, diltiazem 360mg  XR, telmisartan 40mg   BP was better on irbesartan but still on back order- will keep same regimen for now unless BP increases BP Readings from Last 3 Encounters:  03/18/18 134/84  02/11/18 124/68  11/09/17 138/70  A/P: We discussed blood pressure goal of <140/90. Continue current meds

## 2018-03-18 NOTE — Assessment & Plan Note (Signed)
S: she transitioned to oral b12 in December and b12 actually looked better than when on shots- continue oral b12. Lingering neuropathy issues- seems to be slowly getting better- now just on and off A/P: update b12 next visit

## 2018-04-06 ENCOUNTER — Ambulatory Visit: Payer: Medicare Other

## 2018-04-13 ENCOUNTER — Ambulatory Visit (INDEPENDENT_AMBULATORY_CARE_PROVIDER_SITE_OTHER): Payer: Medicare Other | Admitting: General Practice

## 2018-04-13 DIAGNOSIS — Z7901 Long term (current) use of anticoagulants: Secondary | ICD-10-CM

## 2018-04-13 DIAGNOSIS — D6859 Other primary thrombophilia: Secondary | ICD-10-CM | POA: Diagnosis not present

## 2018-04-13 LAB — POCT INR: INR: 3.2 — AB (ref 2.0–3.0)

## 2018-04-13 NOTE — Patient Instructions (Addendum)
Pre visit review using our clinic review tool, if applicable. No additional management support is needed unless otherwise documented below in the visit note.  Continue taking 1 tablet daily except 1 1/2 tablets on Wednesdays.  Re-check in 6 weeks.

## 2018-04-13 NOTE — Progress Notes (Signed)
I have reviewed and agree with note, evaluation, plan.   Stephen Hunter, MD  

## 2018-05-25 ENCOUNTER — Ambulatory Visit (INDEPENDENT_AMBULATORY_CARE_PROVIDER_SITE_OTHER): Payer: Medicare Other | Admitting: General Practice

## 2018-05-25 DIAGNOSIS — Z7901 Long term (current) use of anticoagulants: Secondary | ICD-10-CM | POA: Diagnosis not present

## 2018-05-25 DIAGNOSIS — D6859 Other primary thrombophilia: Secondary | ICD-10-CM

## 2018-05-25 LAB — POCT INR: INR: 3.2 — AB (ref 2.0–3.0)

## 2018-05-25 NOTE — Progress Notes (Signed)
I have reviewed and agree with note, evaluation, plan.   Bentleigh Stankus, MD  

## 2018-05-25 NOTE — Patient Instructions (Addendum)
Pre visit review using our clinic review tool, if applicable. No additional management support is needed unless otherwise documented below in the visit note.  ontinue taking 1 tablet daily except 1 1/2 tablets on Wednesdays.  Re-check in 6 weeks.

## 2018-06-14 DIAGNOSIS — Z1231 Encounter for screening mammogram for malignant neoplasm of breast: Secondary | ICD-10-CM | POA: Diagnosis not present

## 2018-06-14 DIAGNOSIS — Z01419 Encounter for gynecological examination (general) (routine) without abnormal findings: Secondary | ICD-10-CM | POA: Diagnosis not present

## 2018-06-14 DIAGNOSIS — Z124 Encounter for screening for malignant neoplasm of cervix: Secondary | ICD-10-CM | POA: Diagnosis not present

## 2018-07-06 ENCOUNTER — Ambulatory Visit (INDEPENDENT_AMBULATORY_CARE_PROVIDER_SITE_OTHER): Payer: Medicare Other | Admitting: General Practice

## 2018-07-06 DIAGNOSIS — Z7901 Long term (current) use of anticoagulants: Secondary | ICD-10-CM

## 2018-07-06 DIAGNOSIS — D6859 Other primary thrombophilia: Secondary | ICD-10-CM

## 2018-07-06 LAB — POCT INR: INR: 3.5 — AB (ref 2.0–3.0)

## 2018-07-06 NOTE — Patient Instructions (Addendum)
Pre visit review using our clinic review tool, if applicable. No additional management support is needed unless otherwise documented below in the visit note.  Hold dosage today and then continue taking 1 tablet daily except 1 1/2 tablets on Wednesdays.  Re-check in 6 weeks.

## 2018-07-06 NOTE — Progress Notes (Signed)
I have reviewed and agree with note, evaluation, plan.   Aleisa Howk, MD  

## 2018-07-19 ENCOUNTER — Encounter: Payer: Self-pay | Admitting: Family Medicine

## 2018-07-19 ENCOUNTER — Ambulatory Visit (INDEPENDENT_AMBULATORY_CARE_PROVIDER_SITE_OTHER): Payer: Medicare Other | Admitting: Family Medicine

## 2018-07-19 VITALS — BP 120/70 | HR 75 | Ht 64.0 in | Wt 209.0 lb

## 2018-07-19 DIAGNOSIS — E538 Deficiency of other specified B group vitamins: Secondary | ICD-10-CM | POA: Diagnosis not present

## 2018-07-19 DIAGNOSIS — I10 Essential (primary) hypertension: Secondary | ICD-10-CM

## 2018-07-19 DIAGNOSIS — Z23 Encounter for immunization: Secondary | ICD-10-CM | POA: Diagnosis not present

## 2018-07-19 DIAGNOSIS — E039 Hypothyroidism, unspecified: Secondary | ICD-10-CM

## 2018-07-19 DIAGNOSIS — E119 Type 2 diabetes mellitus without complications: Secondary | ICD-10-CM | POA: Diagnosis not present

## 2018-07-19 LAB — COMPREHENSIVE METABOLIC PANEL
ALT: 17 U/L (ref 0–35)
AST: 16 U/L (ref 0–37)
Albumin: 4.6 g/dL (ref 3.5–5.2)
Alkaline Phosphatase: 62 U/L (ref 39–117)
BILIRUBIN TOTAL: 0.6 mg/dL (ref 0.2–1.2)
BUN: 18 mg/dL (ref 6–23)
CALCIUM: 9.3 mg/dL (ref 8.4–10.5)
CHLORIDE: 101 meq/L (ref 96–112)
CO2: 29 meq/L (ref 19–32)
Creatinine, Ser: 1.22 mg/dL — ABNORMAL HIGH (ref 0.40–1.20)
GFR: 47.1 mL/min — AB (ref >60.00)
Glucose, Bld: 108 mg/dL — ABNORMAL HIGH (ref 70–99)
Potassium: 4.1 mEq/L (ref 3.5–5.1)
Sodium: 137 mEq/L (ref 135–145)
Total Protein: 7.2 g/dL (ref 6.0–8.3)

## 2018-07-19 LAB — LIPID PANEL
CHOLESTEROL: 127 mg/dL (ref 0–200)
HDL: 51.7 mg/dL (ref >39.00)
LDL CALC: 57 mg/dL (ref 0–99)
NonHDL: 75.24
Total CHOL/HDL Ratio: 2
Triglycerides: 89 mg/dL (ref 0.0–149.0)
VLDL: 17.8 mg/dL (ref 0.0–40.0)

## 2018-07-19 LAB — VITAMIN B12

## 2018-07-19 LAB — CBC
HEMATOCRIT: 31.8 % — AB (ref 36.0–46.0)
HEMOGLOBIN: 10.3 g/dL — AB (ref 12.0–15.0)
MCHC: 32.5 g/dL (ref 30.0–36.0)
MCV: 74.8 fl — ABNORMAL LOW (ref 78.0–100.0)
PLATELETS: 258 10*3/uL (ref 150.0–400.0)
RBC: 4.25 Mil/uL (ref 3.87–5.11)
RDW: 16.9 % — ABNORMAL HIGH (ref 11.5–15.5)
WBC: 6.7 10*3/uL (ref 4.0–10.5)

## 2018-07-19 LAB — TSH: TSH: 1.95 u[IU]/mL (ref 0.35–4.50)

## 2018-07-19 LAB — HEMOGLOBIN A1C: Hgb A1c MFr Bld: 6.7 % — ABNORMAL HIGH (ref 4.6–6.5)

## 2018-07-19 NOTE — Progress Notes (Signed)
Your hemoglobin a1c is at goal of 7.0 or less at 6.7 today. This is trending up some so lets focus on the healthy eating and regular exercise we discussed.   Your CBC was stable (blood counts, infection fighting cells, platelets) with mild anemia which we should continue to monitor. Your cell size has gone from large when b12 was low now to small- I want to check iron levels next visit--> possibly wellness visit (see below)  I was also looking back and looks like you are due for a wellness visit with one of our excellent nurses. Lets try to schedule her for a visit with wellness team and on same day order another CBC as well as ferritin/iron levels under microcytic anemia.   Your CMET was stable(kidney, liver, and electrolytes, blood sugar) with mild loss of kidney function unchanged from last few checks.   Your cholesterol looks great  B12 continues to look great Your thyroid was normal.

## 2018-07-19 NOTE — Progress Notes (Signed)
Subjective:  Erin Kirk is a 62 y.o. year old very pleasant female patient who presents for/with See problem oriented charting ROS- No chest pain or shortness of breath. No headache or blurry vision.  No edema. No hypoglycemia.    Past Medical History-  Patient Active Problem List   Diagnosis Date Noted  . Low vitamin B12 level 09/07/2016    Priority: High  . Disability examination 01/17/2015    Priority: High  . Type II diabetes mellitus, well controlled ([place]) 11/28/2010    Priority: High  . PAROXYSMAL ATRIAL FIBRILLATION 07/02/2010    Priority: High  . Primary hypercoagulable state ([place]) 11/30/2007    Priority: High  . Neuropathy due to medical condition ([place]) 11/09/2017    Priority: Medium  . Hx of adenomatous polyp of colon 04/10/2016    Priority: Medium  . CKD (chronic kidney disease), stage III ([place]) 09/05/2014    Priority: Medium  . Hypothyroidism 03/09/2007    Priority: Medium  . Essential hypertension 03/09/2007    Priority: Medium  . History of Melanoma 03/09/2007    Priority: Medium  . Encounter for therapeutic drug monitoring 10/26/2013    Priority: Low  . Left facial pain 09/24/2012    Priority: Low  . Oral mucosal lesion 09/24/2012    Priority: Low  . TRANSIENT DISORDER INITIATING/MAINTAINING SLEEP 04/02/2010    Priority: Low  . Overweight(278.02) 11/29/2009    Priority: Low  . LOW BACK PAIN 06/15/2008    Priority: Low  . ALLERGIC RHINITIS 03/09/2007    Priority: Low  . GERD 03/09/2007    Priority: Low  . Morbid obesity ([place]) 03/18/2018  . Long term (current) use of anticoagulants 06/02/2017    Medications- reviewed and updated Current Outpatient Medications  Medication Sig Dispense Refill  . acetaminophen (TYLENOL) 500 MG tablet Take 500 mg by mouth every 6 (six) hours as needed.    . Alum Hydroxide-Mag Carbonate (GAVISCON PO) Take by mouth.    . diltiazem (CARDIZEM CD) 360 MG 24 hr capsule TAKE 1 CAPSULE BY MOUTH ONCE DAILY 90 capsule 3  .  levothyroxine (SYNTHROID, LEVOTHROID) 100 MCG tablet TAKE 1 TABLET BY MOUTH ONCE DAILY 90 tablet 2  . metoprolol succinate (TOPROL-XL) 25 MG 24 hr tablet TAKE 1 TABLET BY MOUTH ONCE DAILY 90 tablet 1  . telmisartan (MICARDIS) 40 MG tablet Take 1 tablet (40 mg total) by mouth daily. 90 tablet 3  . triamterene-hydrochlorothiazide (MAXZIDE-25) 37.5-25 MG tablet TAKE ONE TABLET BY MOUTH ONCE DAILY 90 tablet 3  . vitamin B-12 (CYANOCOBALAMIN) 1000 MCG tablet Take 1,000 mcg by mouth daily.    . Vitamin D, Ergocalciferol, (DRISDOL) 50000 units CAPS capsule TAKE 1 CAPSULE BY MOUTH ONCE A WEEK 12 capsule 17  . warfarin (COUMADIN) 5 MG tablet TAKE AS DIRECTED BY MOUTH BY ANTICOAGULATION  CLINIC 35 tablet 3  . ranitidine (ZANTAC) 300 MG capsule Take 1 capsule (300 mg total) by mouth every evening. (Patient not taking: Reported on 07/19/2018) 90 capsule 1   No current facility-administered medications for this visit.     Objective: BP 120/70 (BP Location: Right Arm, Patient Position: Sitting, Cuff Size: Large)   Pulse 75   Ht 5\' 4"  (1.626 m)   Wt 209 lb (94.8 kg)   SpO2 97%   BMI 35.87 kg/m  Gen: NAD, resting comfortably CV: RRR no murmurs rubs or gallops Lungs: CTAB no crackles, wheeze, rhonchi Abdomen: soft/nontender/nondistended/normal bowel sounds. obese Ext: no edema Skin: warm, dry  Assessment/Plan:  Other  notes: 1.had eye exam- just need updated records- she is to sign ROI   Hypertension  s: controlled on Maxide 25 mg, metoprolol 25 mg extended release, diltiazem 360 mg extended release, telmisartan 40 mg BP Readings from Last 3 Encounters:  07/19/18 120/70  03/18/18 134/84  02/11/18 124/68  A/P: We discussed blood pressure goal of <140/90. Continue current meds   Hypothyroidism S: On thyroid medication-levothyroxine 100 mcg Lab Results  Component Value Date   TSH 1.62 11/09/2017  A/P: suspect stable other than fact has been hard to lose weight- update tsh  S: has really  been watching what she has been eating but has struggled to lose weight. Did exercise bike regularly for a month but has backed off of that- got busy with kids and sister (brother and law passed) and dropped back off.  Had to eat and run a lot during time of helping out with family.  A/P: she has felt discouraged but I reminded her she has been through a tough patch recently- and only gained 1 lb- hoping with things settling down she can get back on exercise and healthier meal prep at home and I suspect she will make progress by follow up (weight stable through the holidays is reasonable goal)    Type II diabetes mellitus, well controlled ([place]) S:  controlled on last visit without metformin. Lab Results  Component Value Date   HGBA1C 6.2 (A) 03/18/2018   HGBA1C 6.5 11/09/2017   HGBA1C 6.4 05/27/2017   A/P: update a1c- thrilled has done so well without medicine  Low vitamin B12 level S: History of low B12.  She has remained on oral B12 since December.  She seems to do better with this as far levels compared to shots Lab Results  Component Value Date   VITAMINB12 >1500 (H) 11/09/2017  A/P: update b12 today   Future Appointments  Date Time Provider [place]  08/10/2018 10:00 AM [facility]   4 months desired- has paperwork she needs filled out at that time as well. May go to 6 months after that potentially  Lab/Order associations: FASTING Type II diabetes mellitus, well controlled ([place]) - Plan: CBC, Comprehensive metabolic panel, Lipid panel, Hemoglobin A1c  Hypothyroidism, unspecified type - Plan: TSH  Essential hypertension  Low vitamin B12 level - Plan: Vitamin B12  Need for influenza vaccination - Plan: Flu Vaccine QUAD 36+ mos IM  Return precautions advised.  Garret Reddish, MD

## 2018-07-19 NOTE — Assessment & Plan Note (Signed)
S:  controlled on last visit without metformin. Lab Results  Component Value Date   HGBA1C 6.2 (A) 03/18/2018   HGBA1C 6.5 11/09/2017   HGBA1C 6.4 05/27/2017   A/P: update a1c- thrilled has done so well without medicine

## 2018-07-19 NOTE — Assessment & Plan Note (Signed)
S: History of low B12.  She has remained on oral B12 since December.  She seems to do better with this as far levels compared to shots Lab Results  Component Value Date   VITAMINB12 >1500 (H) 11/09/2017  A/P: update b12 today

## 2018-07-19 NOTE — Patient Instructions (Addendum)
Health Maintenance Due  Topic Date Due  . OPHTHALMOLOGY EXAM - Sign release of information at the check out desk for these records 12/02/2016   Thanks for doing your flu shot !  Please stop by lab before you go

## 2018-07-25 ENCOUNTER — Other Ambulatory Visit: Payer: Self-pay | Admitting: Family Medicine

## 2018-07-26 ENCOUNTER — Telehealth: Payer: Self-pay

## 2018-07-26 NOTE — Telephone Encounter (Signed)
Pt will need future order for labs upon her wellness visit being scheduled.  Pt to call back this week to schedule.

## 2018-08-01 ENCOUNTER — Ambulatory Visit (INDEPENDENT_AMBULATORY_CARE_PROVIDER_SITE_OTHER): Payer: Medicare Other | Admitting: Family Medicine

## 2018-08-01 ENCOUNTER — Telehealth: Payer: Self-pay | Admitting: Family Medicine

## 2018-08-01 ENCOUNTER — Ambulatory Visit (INDEPENDENT_AMBULATORY_CARE_PROVIDER_SITE_OTHER): Payer: Medicare Other

## 2018-08-01 ENCOUNTER — Encounter: Payer: Self-pay | Admitting: Family Medicine

## 2018-08-01 VITALS — BP 118/70 | HR 71 | Temp 98.0°F | Ht 64.0 in | Wt 204.6 lb

## 2018-08-01 DIAGNOSIS — R05 Cough: Secondary | ICD-10-CM

## 2018-08-01 DIAGNOSIS — R059 Cough, unspecified: Secondary | ICD-10-CM

## 2018-08-01 MED ORDER — BENZONATATE 200 MG PO CAPS: 200.0000 mg | ORAL_CAPSULE | Freq: Three times a day (TID) | ORAL | 0 refills | Status: DC | PRN

## 2018-08-01 MED ORDER — AZITHROMYCIN 250 MG PO TABS: ORAL_TABLET | ORAL | 0 refills | Status: DC

## 2018-08-01 MED ORDER — GUAIFENESIN-CODEINE 100-10 MG/5ML PO SOLN: 10.0000 mL | Freq: Four times a day (QID) | ORAL | 0 refills | Status: DC | PRN

## 2018-08-01 MED ORDER — LEVALBUTEROL TARTRATE 45 MCG/ACT IN AERO: 1.0000 | INHALATION_SPRAY | Freq: Four times a day (QID) | RESPIRATORY_TRACT | 12 refills | Status: DC | PRN

## 2018-08-01 NOTE — Progress Notes (Signed)
Patient: Erin Kirk MRN: [id number] DOB: [birth year] PCP: Marin Olp, MD     Subjective:  Chief Complaint  Patient presents with  . Cough    HPI: The patient is a 62 y.o. female who presents today for cough x 1 week. She states last Sunday she felt like her throat was getting sore and then on Monday it was more in her chest with coughing. Her cough was productive with yellow and green mucous. She has taken mucinex all well, delsym, diabetic tussin and nothing has really helped. She feels like it's getting worse. She is coughing so bad during the night she becomes short of breath and sees little specs. She feels tight in her chest and it hurts/burns. No wheezing/shortness of breath except with really bad fits. No fever/chills, she never took her temperature. She has a nephew in the hospital with pneumonia and saw him on Saturday before all of her symptoms started. She is not a smoker and no asthma/copd.   Review of Systems  Constitutional: Positive for appetite change, chills and fatigue. Negative for fever.  HENT: Positive for ear pain, sinus pressure, sinus pain and sore throat. Negative for congestion and postnasal drip.        Pressure in both ears  Respiratory: Positive for cough. Negative for shortness of breath.   Cardiovascular: Positive for chest pain.       Chest hurts during and right after coughing spell  Gastrointestinal: Negative for abdominal pain, nausea and vomiting.  Musculoskeletal: Positive for neck pain. Negative for back pain.  Neurological: Positive for headaches.  Psychiatric/Behavioral: Positive for sleep disturbance.    Allergies Patient is allergic to iohexol; amoxicillin-pot clavulanate; and other.  Past Medical History Patient  has a past medical history of Allergy, Anxiety, Arthritis, Asthma, Blood transfusion without reported diagnosis, Cancer ([place]), Cataract, Clotting disorder ([place]), Depression, Diabetes ([place]), GERD (gastroesophageal reflux  disease), Hypertension, Irregular heart beat, Lupus anticoagulant with hypercoagulable state ([place]), Primary hypercoagulable state ([place]), Pulmonary embolism ([place]), and Thyroid disease.  Surgical History Patient  has a past surgical history that includes Cholecystectomy; Abdominal hysterectomy; Oophorectomy; melanoma removal; Back surgery (1983); fatty tumor breast; Knee arthroscopy; Colonoscopy; Upper gastrointestinal endoscopy; and removal lymphnodes in right neck.  Family History Pateint's family history includes Coronary artery disease in her mother; Diabetes in her father, mother, and sister; Hypertension in her father; Mitral valve prolapse in her sister.  Social History Patient  reports that she has never smoked. She has never used smokeless tobacco. She reports that she does not drink alcohol or use drugs.    Objective: Vitals:   08/01/18 1008  BP: 118/70  Pulse: 71  Temp: 98 F (36.7 C)  TempSrc: Oral  SpO2: 98%  Weight: 204 lb 9.6 oz (92.8 kg)  Height: 5\' 4"  (1.626 m)    Body mass index is 35.12 kg/m.  Physical Exam  Constitutional: She appears well-developed and well-nourished.  HENT:  Right Ear: External ear normal.  Left Ear: External ear normal.  Mouth/Throat: Oropharynx is clear and moist. No oropharyngeal exudate.  Tm pearly with light reflex bilaterally  Sinus exam normal   Neck: Normal range of motion. Neck supple.  Cardiovascular: Normal rate, regular rhythm and normal heart sounds.  Pulmonary/Chest: Effort normal and breath sounds normal. No respiratory distress. She has no wheezes. She has no rales.  Abdominal: Soft. Bowel sounds are normal.  Lymphadenopathy:    She has no cervical adenopathy.  Skin: Skin is warm and dry. Capillary refill takes  less than 2 seconds. No rash noted.  Vitals reviewed.     CXR: ? Early pneumonia in RLL vs. Atelectasis. Airway disease. Official read pending.   Assessment/plan: 1. Cough zpack and supportive therapy.  Concern for possible consolidation in RLL. Sending in xopenex with afib history, tessalon pearls and codeine cough syrup. Recommended sugar free Robitussin DM and cool mist humidifier at night. Drowsy precautions given. Discussed bronchitis can last up to 6 weeks so to continue with supportive therapy. If radiologist reads xray as pneumonia then she will need repeat cxr in 4-6 weeks. Fever/worsening symptoms: ER.  - DG Chest 2 View; Future      Return if symptoms worsen or fail to improve.   Orma Flaming, MD [place]   08/01/2018

## 2018-08-01 NOTE — Telephone Encounter (Signed)
Copied from [place]. Topic: Quick Communication - Rx Refill/Question >> Aug 01, 2018 11:35 AM Scherrie Gerlach wrote: Medication: azithromycin (ZITHROMAX) 250 MG tablet and the warfarin (COUMADIN) 5 MG tablet  Pharmacist Avelino Leeds, calling to ask if the dr aware of the drug interaction between these 2 meds, prescribed today.  Increased risk of bleeding. Please call back and advise [place], [place] - [address] [phone] (Phone) [phone] (Fax)

## 2018-08-01 NOTE — Telephone Encounter (Signed)
Copied from [place] [id number]. Topic: Quick Communication - See Telephone Encounter >> Aug 01, 2018 12:17 PM Vernona Rieger wrote: CRM for notification. See Telephone encounter for: 08/01/18.  [place] called and wanted to let [name] know that there is a drug interaction with warfarin (COUMADIN) 5 MG tablet & azithromycin (ZITHROMAX) 250 MG tablet. Patient seen Dr Rogers Blocker today and she prescribed her azithromycin (ZITHROMAX) 250 MG tablet. Please advise.

## 2018-08-01 NOTE — Telephone Encounter (Signed)
Please see message. °

## 2018-08-01 NOTE — Telephone Encounter (Signed)
See note

## 2018-08-01 NOTE — Telephone Encounter (Signed)
Caller name: Avelino Leeds  Relation to pt: [facility] Pharmacist  Call back number: [phone]    Reason for call:  Checking on the status of message below, please advise

## 2018-08-01 NOTE — Patient Instructions (Signed)
-  cool mist humidifier at night -1 tsp of honey daily -robitussin DM sugar free during day -codeine cough syrup at night -tessalon pearls up to three times a day as needed for cough -inhaler prn. I sent in xopenex so not to affect your heart rate  -antibiotics-zpack sent in..   If this is early pneumonia, I will need you to come back for repeat CXR in 4-6 weeks. I think you also have bronchitis which is coughing x 6 weeks.

## 2018-08-01 NOTE — Telephone Encounter (Signed)
Called and spoke with Avelino Leeds (pharmacist) at [facility] on [address] and verified that Zithromax is fine to take with Coumadin.  Asked pharmacist to advise patient on bleeding precautions and have pt exercise caution while taking medication.  Pharmacist verbalized understanding.

## 2018-08-04 ENCOUNTER — Other Ambulatory Visit: Payer: Self-pay | Admitting: Family Medicine

## 2018-08-09 ENCOUNTER — Ambulatory Visit (INDEPENDENT_AMBULATORY_CARE_PROVIDER_SITE_OTHER): Payer: Medicare Other

## 2018-08-09 ENCOUNTER — Encounter: Payer: Self-pay | Admitting: Physician Assistant

## 2018-08-09 ENCOUNTER — Ambulatory Visit (INDEPENDENT_AMBULATORY_CARE_PROVIDER_SITE_OTHER): Payer: Medicare Other | Admitting: Physician Assistant

## 2018-08-09 VITALS — BP 100/60 | HR 77 | Temp 98.1°F | Ht 64.0 in | Wt 206.4 lb

## 2018-08-09 DIAGNOSIS — R05 Cough: Secondary | ICD-10-CM | POA: Diagnosis not present

## 2018-08-09 DIAGNOSIS — R0602 Shortness of breath: Secondary | ICD-10-CM | POA: Diagnosis not present

## 2018-08-09 DIAGNOSIS — R059 Cough, unspecified: Secondary | ICD-10-CM

## 2018-08-09 LAB — POC INFLUENZA A&B (BINAX/QUICKVUE)
Influenza A, POC: NEGATIVE
Influenza B, POC: NEGATIVE

## 2018-08-09 MED ORDER — DOXYCYCLINE HYCLATE 100 MG PO TABS: 100.0000 mg | ORAL_TABLET | Freq: Two times a day (BID) | ORAL | 0 refills | Status: DC

## 2018-08-09 MED ORDER — LEVALBUTEROL HCL 1.25 MG/3ML IN NEBU
1.2500 mg | INHALATION_SOLUTION | Freq: Once | RESPIRATORY_TRACT | Status: AC
  Administered 2018-08-09: 1.25 mg via RESPIRATORY_TRACT

## 2018-08-09 NOTE — Patient Instructions (Signed)
It was great to see you!  Use medication as prescribed: doxycycline  Push fluids and get plenty of rest. Please return if you are not improving as expected, or if you have high fevers (>101.5) or difficulty swallowing or worsening productive cough.  Call clinic with questions.  I hope you start feeling better soon!  

## 2018-08-09 NOTE — Progress Notes (Signed)
Erin Kirk is a 62 y.o. female here for a follow up of a pre-existing problem.  I acted as a Education administrator for [employer], PA-C Anselmo Pickler, LPN  History of Present Illness:   Chief Complaint  Patient presents with  . Cough    Cough  This is a recurrent problem. Episode onset: Started 2 weeks ago, was starting to feel better on Friday then came back Saturday full force. The problem has been gradually worsening. The problem occurs constantly. The cough is productive of sputum (yellow/green sputum). Associated symptoms include chills, a fever (thinks at night but has not taken temp), headaches, nasal congestion (yellow drainage), postnasal drip, a sore throat, shortness of breath, sweats and wheezing. Associated symptoms comments: Sinus pressure. The symptoms are aggravated by lying down. She has tried OTC cough suppressant for the symptoms. The treatment provided mild (Azithromycin finished Friday, Tessalon capsules, OTC Diabetic Tussin) relief. Her past medical history is significant for bronchitis and pneumonia.   Feels like her symptoms are returning but in a different way -- now she is having significant facial pressure and nasal drainage. She continues to endorse cough.    Past Medical History:  Diagnosis Date  . Allergy   . Anxiety   . Arthritis   . Asthma   . Blood transfusion without reported diagnosis   . Cancer ([place])     melanoma  . Cataract   . Clotting disorder ([place])   . Depression   . Diabetes ([place])   . GERD (gastroesophageal reflux disease)   . Hypertension   . Irregular heart beat   . Lupus anticoagulant with hypercoagulable state ([place])   . Primary hypercoagulable state ([place])   . Pulmonary embolism ([place])   . Thyroid disease    hypothyroidism     Social History   Socioeconomic History  . Marital status: Married    Spouse name: Not on file  . Number of children: Not on file  . Years of education: Not on file  . Highest education level: Not on file   Occupational History  . Not on file  Social Needs  . Financial resource strain: Not on file  . Food insecurity:    Worry: Not on file    Inability: Not on file  . Transportation needs:    Medical: Not on file    Non-medical: Not on file  Tobacco Use  . Smoking status: Never Smoker  . Smokeless tobacco: Never Used  Substance and Sexual Activity  . Alcohol use: No    Alcohol/week: 0.0 standard drinks    Comment: no   . Drug use: No  . Sexual activity: Yes  Lifestyle  . Physical activity:    Days per week: Not on file    Minutes per session: Not on file  . Stress: Not on file  Relationships  . Social connections:    Talks on phone: Not on file    Gets together: Not on file    Attends religious service: Not on file    Active member of club or organization: Not on file    Attends meetings of clubs or organizations: Not on file    Relationship status: Not on file  . Intimate partner violence:    Fear of current or ex partner: Not on file    Emotionally abused: Not on file    Physically abused: Not on file    Forced sexual activity: Not on file  Other Topics Concern  . Not on file  Social History Narrative   Married 1971. 1 child [name] (by [employer]) lives in [place] with 2 grandchildren girls 8,6 in 2015.       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     Past Surgical History:  Procedure Laterality Date  . ABDOMINAL HYSTERECTOMY     noncancerous  . BACK SURGERY  1983  . CHOLECYSTECTOMY    . COLONOSCOPY    . fatty tumor breast     removed  . KNEE ARTHROSCOPY    . melanoma removal     with right neck lymph nodes  . OOPHORECTOMY    . removal lymphnodes in right neck    . UPPER GASTROINTESTINAL ENDOSCOPY      Family History  Problem Relation Age of Onset  . Coronary artery disease Mother        Mom 68, Dad 57-smoker  . Diabetes Mother   . Hypertension Father   . Diabetes  Father   . Mitral valve prolapse Sister   . Diabetes Sister   . Colon cancer Neg Hx   . Esophageal cancer Neg Hx   . Pancreatic cancer Neg Hx   . Rectal cancer Neg Hx   . Stomach cancer Neg Hx     Allergies  Allergen Reactions  . Guaifenesin & Derivatives Nausea And Vomiting    Hallucinations  . Iohexol Anaphylaxis, Hives and Shortness Of Breath    Incident 1990, premedicated prior to obtaining IV contrast since.  . Amoxicillin-Pot Clavulanate Swelling and Other (See Comments)    Facial swelling  . Other     Contrast dye    Current Medications:   Current Outpatient Medications:  .  acetaminophen (TYLENOL) 500 MG tablet, Take 500 mg by mouth every 6 (six) hours as needed., Disp: , Rfl:  .  Alum Hydroxide-Mag Carbonate (GAVISCON PO), Take by mouth., Disp: , Rfl:  .  benzonatate (TESSALON) 200 MG capsule, Take 1 capsule (200 mg total) by mouth 3 (three) times daily as needed for cough., Disp: 30 capsule, Rfl: 0 .  diltiazem (CARDIZEM CD) 360 MG 24 hr capsule, TAKE 1 CAPSULE BY MOUTH ONCE DAILY, Disp: 90 capsule, Rfl: 3 .  doxycycline (VIBRA-TABS) 100 MG tablet, Take 1 tablet (100 mg total) by mouth 2 (two) times daily., Disp: 20 tablet, Rfl: 0 .  levalbuterol (XOPENEX HFA) 45 MCG/ACT inhaler, Inhale 1 puff into the lungs every 6 (six) hours as needed for wheezing., Disp: 1 Inhaler, Rfl: 12 .  levothyroxine (SYNTHROID, LEVOTHROID) 100 MCG tablet, TAKE 1 TABLET BY MOUTH ONCE DAILY, Disp: 90 tablet, Rfl: 2 .  metoprolol succinate (TOPROL-XL) 25 MG 24 hr tablet, TAKE 1 TABLET BY MOUTH ONCE DAILY, Disp: 90 tablet, Rfl: 1 .  telmisartan (MICARDIS) 40 MG tablet, Take 1 tablet (40 mg total) by mouth daily., Disp: 90 tablet, Rfl: 3 .  triamterene-hydrochlorothiazide (MAXZIDE-25) 37.5-25 MG tablet, TAKE ONE TABLET BY MOUTH ONCE DAILY, Disp: 90 tablet, Rfl: 3 .  vitamin B-12 (CYANOCOBALAMIN) 1000 MCG tablet, Take 1,000 mcg by mouth daily., Disp: , Rfl:  .  Vitamin D, Ergocalciferol, (DRISDOL)  50000 units CAPS capsule, TAKE 1 CAPSULE BY MOUTH ONCE A WEEK, Disp: 12 capsule, Rfl: 17 .  warfarin (COUMADIN) 5 MG tablet, TAKE AS DIRECTED BY MOUTH BY ANTICOAGULATION CLINIC, Disp: 35 tablet, Rfl: 3   Review of Systems:   Review of Systems  Constitutional: Positive for chills and fever (thinks  at night but has not taken temp).  HENT: Positive for postnasal drip and sore throat.   Respiratory: Positive for cough, shortness of breath and wheezing.   Neurological: Positive for headaches.    Vitals:   Vitals:   08/09/18 0945  BP: 100/60  Pulse: 77  Temp: 98.1 F (36.7 C)  TempSrc: Oral  SpO2: 96%  Weight: 206 lb 6.1 oz (93.6 kg)  Height: 5\' 4"  (1.626 m)     Body mass index is 35.43 kg/m.  Physical Exam:   Physical Exam  Constitutional: She appears well-developed. She is cooperative.  Non-toxic appearance. She does not have a sickly appearance. She does not appear ill. No distress.  HENT:  Head: Normocephalic and atraumatic.  Right Ear: Tympanic membrane, external ear and ear canal normal. Tympanic membrane is not erythematous, not retracted and not bulging.  Left Ear: Tympanic membrane, external ear and ear canal normal. Tympanic membrane is not erythematous, not retracted and not bulging.  Nose: Nose normal. Right sinus exhibits no maxillary sinus tenderness and no frontal sinus tenderness. Left sinus exhibits no maxillary sinus tenderness and no frontal sinus tenderness.  Mouth/Throat: Uvula is midline. No posterior oropharyngeal edema or posterior oropharyngeal erythema.  Eyes: Conjunctivae and lids are normal.  Neck: Trachea normal.  Cardiovascular: Normal rate, regular rhythm, S1 normal, S2 normal and normal heart sounds.  Pulmonary/Chest: Effort normal. She has no decreased breath sounds. She has no wheezes. She has no rhonchi. She has no rales.  Difficult to auscultate -- she is continuously coughing. After nebulizer, lungs were clear to auscultation bilaterally with  minimal coughing.  Lymphadenopathy:    She has no cervical adenopathy.  Neurological: She is alert.  Skin: Skin is warm, dry and intact.  Psychiatric: She has a normal mood and affect. Her speech is normal and behavior is normal.  Nursing note and vitals reviewed.     Results for orders placed or performed in visit on 08/09/18  POC Influenza A&B(BINAX/QUICKVUE)  Result Value Ref Range   Influenza A, POC Negative Negative   Influenza B, POC Negative Negative   Repeat chest xray today -- pending official read, ?stable from prior xray  Assessment and Plan:   Natlie was seen today for cough.  Diagnoses and all orders for this visit:  Cough No red flags on exam.  Awaiting official radiology read of CXR. Will initiate doxycycline per orders to cover for new sinusitis symptoms. Flu swab was negative. We briefly discussed using Levaquin however she would like to avoid use for possible side effects at this time. Discussed taking medications as prescribed. Reviewed return precautions including worsening fever, SOB, worsening cough or other concerns. Push fluids and rest. I recommend that patient follow-up if symptoms worsen or persist despite treatment x 7-10 days, sooner if needed. -     DG Chest 2 View; Future -     POC Influenza A&B(BINAX/QUICKVUE) -     levalbuterol (XOPENEX) nebulizer solution 1.25 mg  Other orders -     doxycycline (VIBRA-TABS) 100 MG tablet; Take 1 tablet (100 mg total) by mouth 2 (two) times daily.  . Reviewed expectations re: course of current medical issues. . Discussed self-management of symptoms. . Outlined signs and symptoms indicating need for more acute intervention. . Patient verbalized understanding and all questions were answered. . See orders for this visit as documented in the electronic medical record. . Patient received an After-Visit Summary.  CMA or LPN served as scribe during this visit. History, Physical,  and Plan performed by medical provider.  The above documentation has been reviewed and is accurate and complete.  [name], PA-C

## 2018-08-10 ENCOUNTER — Ambulatory Visit: Payer: Medicare Other

## 2018-08-17 ENCOUNTER — Ambulatory Visit (INDEPENDENT_AMBULATORY_CARE_PROVIDER_SITE_OTHER): Payer: Medicare Other | Admitting: General Practice

## 2018-08-17 DIAGNOSIS — Z7901 Long term (current) use of anticoagulants: Secondary | ICD-10-CM

## 2018-08-17 DIAGNOSIS — D6859 Other primary thrombophilia: Secondary | ICD-10-CM

## 2018-08-17 LAB — POCT INR: INR: 4.2 — AB (ref 2.0–3.0)

## 2018-08-17 NOTE — Progress Notes (Signed)
I have reviewed and agree with note, evaluation, plan.   Stephen Hunter, MD  

## 2018-08-17 NOTE — Patient Instructions (Addendum)
Pre visit review using our clinic review tool, if applicable. No additional management support is needed unless otherwise documented below in the visit note.  Hold dosage today and then take 1/2 tablet tomorrow.  On Friday continue taking 1 tablet daily except 1 1/2 tablets on Wednesdays.  Re-check in 3 weeks.

## 2018-08-20 ENCOUNTER — Other Ambulatory Visit: Payer: Self-pay | Admitting: Family Medicine

## 2018-08-23 DIAGNOSIS — Z85820 Personal history of malignant melanoma of skin: Secondary | ICD-10-CM | POA: Diagnosis not present

## 2018-08-23 DIAGNOSIS — D485 Neoplasm of uncertain behavior of skin: Secondary | ICD-10-CM | POA: Diagnosis not present

## 2018-08-23 DIAGNOSIS — D225 Melanocytic nevi of trunk: Secondary | ICD-10-CM | POA: Diagnosis not present

## 2018-08-23 DIAGNOSIS — C44712 Basal cell carcinoma of skin of right lower limb, including hip: Secondary | ICD-10-CM | POA: Diagnosis not present

## 2018-08-23 DIAGNOSIS — Z85828 Personal history of other malignant neoplasm of skin: Secondary | ICD-10-CM | POA: Diagnosis not present

## 2018-08-23 DIAGNOSIS — D2261 Melanocytic nevi of right upper limb, including shoulder: Secondary | ICD-10-CM | POA: Diagnosis not present

## 2018-08-23 DIAGNOSIS — L430 Hypertrophic lichen planus: Secondary | ICD-10-CM | POA: Diagnosis not present

## 2018-08-23 DIAGNOSIS — D224 Melanocytic nevi of scalp and neck: Secondary | ICD-10-CM | POA: Diagnosis not present

## 2018-08-23 DIAGNOSIS — L814 Other melanin hyperpigmentation: Secondary | ICD-10-CM | POA: Diagnosis not present

## 2018-08-23 DIAGNOSIS — D1801 Hemangioma of skin and subcutaneous tissue: Secondary | ICD-10-CM | POA: Diagnosis not present

## 2018-08-23 DIAGNOSIS — D2262 Melanocytic nevi of left upper limb, including shoulder: Secondary | ICD-10-CM | POA: Diagnosis not present

## 2018-08-29 ENCOUNTER — Telehealth: Payer: Self-pay | Admitting: Family Medicine

## 2018-08-29 NOTE — Telephone Encounter (Signed)
Erin Kirk stopped by today 08/29/18 to have a form filled out by Dr. Yong Channel. She says that she had left one for our office before but it's been lost. She is requesting that this one is rushed. Thanks so much!

## 2018-08-29 NOTE — Telephone Encounter (Signed)
See note

## 2018-08-29 NOTE — Telephone Encounter (Signed)
Copied from [place] [id number]. Topic: Quick Communication - See Telephone Encounter >> Aug 29, 2018 10:34 AM [name] wrote: CRM for notification. See Telephone encounter for: 08/29/18.  Patient is calling regarding a disability form to be completed for her insurance.  The form needs to be completed again  the second time this year. Patient will drop off the disability form. Thank you

## 2018-08-29 NOTE — Telephone Encounter (Signed)
Noted. Will look for form.  

## 2018-08-31 DIAGNOSIS — Z0279 Encounter for issue of other medical certificate: Secondary | ICD-10-CM

## 2018-08-31 NOTE — Telephone Encounter (Signed)
noted 

## 2018-09-05 ENCOUNTER — Telehealth: Payer: Self-pay | Admitting: Family Medicine

## 2018-09-05 NOTE — Telephone Encounter (Signed)
Copied from [place] [id number]. Topic: Appointment Scheduling - Scheduling Inquiry for Clinic >> Sep 05, 2018 12:18 PM Bea Graff, NT wrote: Reason for CRM: Pt would like to see if she can be worked in tomorrow (12/17) in the morning with Dr. Yong Channel after 9:30am and before 11am for possible pneumonia. She just got over it and may be getting it again.

## 2018-09-06 ENCOUNTER — Encounter: Payer: Self-pay | Admitting: Physician Assistant

## 2018-09-06 ENCOUNTER — Ambulatory Visit (INDEPENDENT_AMBULATORY_CARE_PROVIDER_SITE_OTHER): Payer: Medicare Other

## 2018-09-06 ENCOUNTER — Ambulatory Visit (INDEPENDENT_AMBULATORY_CARE_PROVIDER_SITE_OTHER): Payer: Medicare Other | Admitting: Physician Assistant

## 2018-09-06 VITALS — BP 100/60 | HR 77 | Temp 98.3°F | Ht 64.0 in | Wt 201.0 lb

## 2018-09-06 DIAGNOSIS — J9811 Atelectasis: Secondary | ICD-10-CM | POA: Diagnosis not present

## 2018-09-06 DIAGNOSIS — R059 Cough, unspecified: Secondary | ICD-10-CM

## 2018-09-06 DIAGNOSIS — R05 Cough: Secondary | ICD-10-CM

## 2018-09-06 DIAGNOSIS — R053 Chronic cough: Secondary | ICD-10-CM

## 2018-09-06 MED ORDER — PREDNISONE 5 MG PO TABS: ORAL_TABLET | ORAL | 0 refills | Status: DC

## 2018-09-06 MED ORDER — BUDESONIDE-FORMOTEROL FUMARATE 160-4.5 MCG/ACT IN AERO: 2.0000 | INHALATION_SPRAY | Freq: Two times a day (BID) | RESPIRATORY_TRACT | 3 refills | Status: DC

## 2018-09-06 MED ORDER — LEVOFLOXACIN 500 MG PO TABS: 500.0000 mg | ORAL_TABLET | Freq: Every day | ORAL | 0 refills | Status: DC

## 2018-09-06 NOTE — Progress Notes (Signed)
Erin Kirk is a 62 y.o. female here for a follow up of a pre-existing problem.  I acted as a Education administrator for [employer], PA-C Anselmo Pickler, LPN  History of Present Illness:   Chief Complaint  Patient presents with  . Cough   Seen 11/11 for cough, given Z-pack, benzonatate, cheratussin and xopenex inhaler. Seen 11/19 for lack of improvement and worsening sinus symptoms, given doxycycline.  Cough  This is a recurrent problem. The current episode started more than 1 month ago (Started again bad Thursday night). The problem has been gradually worsening. The problem occurs constantly. The cough is productive of sputum (expectorating yellow sputum). Associated symptoms include ear pain (Right ), headaches, nasal congestion (clear to yellow drainage), postnasal drip, a sore throat, shortness of breath and wheezing. Pertinent negatives include no chills, ear congestion or fever. The symptoms are aggravated by lying down. She has tried OTC cough suppressant (Diabetic Tussin, Mucinex, Alka-seltzer cold and Flu) for the symptoms. The treatment provided no relief. Her past medical history is significant for bronchitis and pneumonia.   She has had and tolerated steroids in the past for prior illness. Has not had any for current illness. Lab Results  Component Value Date   HGBA1C 6.7 (H) 07/19/2018     Past Medical History:  Diagnosis Date  . Allergy   . Anxiety   . Arthritis   . Asthma   . Blood transfusion without reported diagnosis   . Cancer ([place])     melanoma  . Cataract   . Clotting disorder ([place])   . Depression   . Diabetes ([place])   . GERD (gastroesophageal reflux disease)   . Hypertension   . Irregular heart beat   . Lupus anticoagulant with hypercoagulable state ([place])   . Primary hypercoagulable state ([place])   . Pulmonary embolism ([place])   . Thyroid disease    hypothyroidism     Social History   Socioeconomic History  . Marital status: Married    Spouse name: Not on  file  . Number of children: Not on file  . Years of education: Not on file  . Highest education level: Not on file  Occupational History  . Not on file  Social Needs  . Financial resource strain: Not on file  . Food insecurity:    Worry: Not on file    Inability: Not on file  . Transportation needs:    Medical: Not on file    Non-medical: Not on file  Tobacco Use  . Smoking status: Never Smoker  . Smokeless tobacco: Never Used  Substance and Sexual Activity  . Alcohol use: No    Alcohol/week: 0.0 standard drinks    Comment: no   . Drug use: No  . Sexual activity: Yes  Lifestyle  . Physical activity:    Days per week: Not on file    Minutes per session: Not on file  . Stress: Not on file  Relationships  . Social connections:    Talks on phone: Not on file    Gets together: Not on file    Attends religious service: Not on file    Active member of club or organization: Not on file    Attends meetings of clubs or organizations: Not on file    Relationship status: Not on file  . Intimate partner violence:    Fear of current or ex partner: Not on file    Emotionally abused: Not on file    Physically abused:  Not on file    Forced sexual activity: Not on file  Other Topics Concern  . Not on file  Social History Narrative   Married 1971. 1 child Erin Kirk (by [employer]) lives in [place] with 2 grandchildren girls 8,6 in 2015.       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     Past Surgical History:  Procedure Laterality Date  . ABDOMINAL HYSTERECTOMY     noncancerous  . BACK SURGERY  1983  . CHOLECYSTECTOMY    . COLONOSCOPY    . fatty tumor breast     removed  . KNEE ARTHROSCOPY    . melanoma removal     with right neck lymph nodes  . OOPHORECTOMY    . removal lymphnodes in right neck    . UPPER GASTROINTESTINAL ENDOSCOPY      Family History  Problem Relation Age of Onset  .  Coronary artery disease Mother        Mom 67, Dad 57-smoker  . Diabetes Mother   . Hypertension Father   . Diabetes Father   . Mitral valve prolapse Sister   . Diabetes Sister   . Colon cancer Neg Hx   . Esophageal cancer Neg Hx   . Pancreatic cancer Neg Hx   . Rectal cancer Neg Hx   . Stomach cancer Neg Hx     Allergies  Allergen Reactions  . Guaifenesin & Derivatives Nausea And Vomiting    Hallucinations  . Iohexol Anaphylaxis, Hives and Shortness Of Breath    Incident 1990, premedicated prior to obtaining IV contrast since.  . Amoxicillin-Pot Clavulanate Swelling and Other (See Comments)    Facial swelling  . Other     Contrast dye    Current Medications:   Current Outpatient Medications:  .  acetaminophen (TYLENOL) 500 MG tablet, Take 500 mg by mouth every 6 (six) hours as needed., Disp: , Rfl:  .  Alum Hydroxide-Mag Carbonate (GAVISCON PO), Take by mouth., Disp: , Rfl:  .  diltiazem (CARDIZEM CD) 360 MG 24 hr capsule, TAKE 1 CAPSULE BY MOUTH ONCE DAILY, Disp: 90 capsule, Rfl: 3 .  levalbuterol (XOPENEX HFA) 45 MCG/ACT inhaler, Inhale 1 puff into the lungs every 6 (six) hours as needed for wheezing., Disp: 1 Inhaler, Rfl: 12 .  levothyroxine (SYNTHROID, LEVOTHROID) 100 MCG tablet, TAKE 1 TABLET BY MOUTH ONCE DAILY, Disp: 90 tablet, Rfl: 2 .  metoprolol succinate (TOPROL-XL) 25 MG 24 hr tablet, TAKE 1 TABLET BY MOUTH ONCE DAILY, Disp: 90 tablet, Rfl: 1 .  telmisartan (MICARDIS) 40 MG tablet, Take 1 tablet (40 mg total) by mouth daily., Disp: 90 tablet, Rfl: 3 .  triamterene-hydrochlorothiazide (MAXZIDE-25) 37.5-25 MG tablet, TAKE 1 TABLET BY MOUTH ONCE DAILY, Disp: 90 tablet, Rfl: 3 .  vitamin B-12 (CYANOCOBALAMIN) 1000 MCG tablet, Take 1,000 mcg by mouth daily., Disp: , Rfl:  .  Vitamin D, Ergocalciferol, (DRISDOL) 50000 units CAPS capsule, TAKE 1 CAPSULE BY MOUTH ONCE A WEEK, Disp: 12 capsule, Rfl: 17 .  warfarin (COUMADIN) 5 MG tablet, TAKE AS DIRECTED BY MOUTH BY  ANTICOAGULATION CLINIC, Disp: 35 tablet, Rfl: 3 .  budesonide-formoterol (SYMBICORT) 160-4.5 MCG/ACT inhaler, Inhale 2 puffs into the lungs 2 (two) times daily., Disp: 1 Inhaler, Rfl: 3 .  predniSONE (DELTASONE) 5 MG tablet, 6-5-4-3-2-1-off, Disp: 21 tablet, Rfl: 0   Review of Systems:   Review of Systems  Constitutional: Negative  for chills and fever.  HENT: Positive for ear pain (Right ), postnasal drip and sore throat.   Respiratory: Positive for cough, shortness of breath and wheezing.   Neurological: Positive for headaches.    Vitals:   Vitals:   09/06/18 1040  BP: 100/60  Pulse: 77  Temp: 98.3 F (36.8 C)  TempSrc: Oral  SpO2: 97%  Weight: 201 lb (91.2 kg)  Height: 5\' 4"  (1.626 m)     Body mass index is 34.5 kg/m.  Physical Exam:   Physical Exam Vitals signs and nursing note reviewed.  Constitutional:      General: She is not in acute distress.    Appearance: She is well-developed. She is not ill-appearing or toxic-appearing.  HENT:     Head: Normocephalic and atraumatic.     Right Ear: Tympanic membrane, ear canal and external ear normal. Tympanic membrane is not erythematous, retracted or bulging.     Left Ear: Tympanic membrane, ear canal and external ear normal. Tympanic membrane is not erythematous, retracted or bulging.     Nose: Nose normal.     Right Sinus: No maxillary sinus tenderness or frontal sinus tenderness.     Left Sinus: No maxillary sinus tenderness or frontal sinus tenderness.     Mouth/Throat:     Pharynx: Uvula midline. Posterior oropharyngeal erythema present.     Tonsils: Swelling: 0 on the right. 0 on the left.  Eyes:     General: Lids are normal.     Conjunctiva/sclera: Conjunctivae normal.  Neck:     Trachea: Trachea normal.  Cardiovascular:     Rate and Rhythm: Normal rate and regular rhythm.     Heart sounds: Normal heart sounds, S1 normal and S2 normal.  Pulmonary:     Effort: Pulmonary effort is normal.     Breath sounds:  Examination of the right-lower field reveals rhonchi. Decreased breath sounds and rhonchi present. No wheezing or rales.     Comments: Severe coughing fits throughout examination. Lymphadenopathy:     Cervical: No cervical adenopathy.  Skin:    General: Skin is warm and dry.  Neurological:     Mental Status: She is alert.  Psychiatric:        Speech: Speech normal.        Behavior: Behavior normal. Behavior is cooperative.    IMPRESSION: Mild left base subsegmental atelectasis  Assessment and Plan:   Lanissa was seen today for cough.  Diagnoses and all orders for this visit:  Chronic cough No red flags on exam. No evidence of PNA on CXR, does have possible mild atectasis.  Will initiate oral prednisone per orders and symbicort. Follow-up with PCP in 1-2 weeks. Pulmonology referral also in place. Discussed taking medications as prescribed. Reviewed return precautions including worsening fever, SOB, worsening cough or other concerns. Push fluids and rest. I recommend that patient follow-up if symptoms worsen or persist despite treatment x 7-10 days, sooner if needed. -     Ambulatory referral to Pulmonology       -     DG Chest 2 View; Future  Other orders -     predniSONE (DELTASONE) 5 MG tablet; 6-5-4-3-2-1-off -     budesonide-formoterol (SYMBICORT) 160-4.5 MCG/ACT inhaler; Inhale 2 puffs into the lungs 2 (two) times daily.  . Reviewed expectations re: course of current medical issues. . Discussed self-management of symptoms. . Outlined signs and symptoms indicating need for more acute intervention. . Patient verbalized understanding and all questions were answered. Marland Kitchen  See orders for this visit as documented in the electronic medical record. . Patient received an After-Visit Summary.  CMA or LPN served as scribe during this visit. History, Physical, and Plan performed by medical provider. The above documentation has been reviewed and is accurate and complete.  [name],  PA-C

## 2018-09-06 NOTE — Patient Instructions (Signed)
It was great to see you!  Use medication as prescribed: oral prednisone and scheduled steroid inhaler  Follow-up with Dr. Yong Channel the week after Christmas.  I have put in pulmonary referral.  Push fluids and get plenty of rest.  Please return if you are not improving as expected, or if you have high fevers (>101.5) or difficulty swallowing or worsening productive cough.  Call clinic with questions.  I hope you start feeling better soon!

## 2018-09-06 NOTE — Telephone Encounter (Signed)
Patient scheduled with [name] this morning

## 2018-09-07 ENCOUNTER — Ambulatory Visit (INDEPENDENT_AMBULATORY_CARE_PROVIDER_SITE_OTHER): Payer: Medicare Other | Admitting: General Practice

## 2018-09-07 DIAGNOSIS — Z7901 Long term (current) use of anticoagulants: Secondary | ICD-10-CM

## 2018-09-07 DIAGNOSIS — D6859 Other primary thrombophilia: Secondary | ICD-10-CM

## 2018-09-07 LAB — POCT INR: INR: 3.6 — AB (ref 2.0–3.0)

## 2018-09-07 NOTE — Patient Instructions (Addendum)
Pre visit review using our clinic review tool, if applicable. No additional management support is needed unless otherwise documented below in the visit note.  Hold dosage today and then take 1 tablet daily.   Re-check in 3 weeks.

## 2018-09-07 NOTE — Progress Notes (Signed)
I have reviewed and agree with note, evaluation, plan.   Stephen Hunter, MD  

## 2018-09-17 ENCOUNTER — Other Ambulatory Visit: Payer: Self-pay | Admitting: Family Medicine

## 2018-09-19 ENCOUNTER — Encounter: Payer: Self-pay | Admitting: Family Medicine

## 2018-09-19 ENCOUNTER — Ambulatory Visit (INDEPENDENT_AMBULATORY_CARE_PROVIDER_SITE_OTHER): Payer: Medicare Other | Admitting: Family Medicine

## 2018-09-19 VITALS — BP 118/70 | HR 81 | Temp 97.9°F | Ht 64.0 in | Wt 202.6 lb

## 2018-09-19 DIAGNOSIS — E119 Type 2 diabetes mellitus without complications: Secondary | ICD-10-CM

## 2018-09-19 DIAGNOSIS — R05 Cough: Secondary | ICD-10-CM | POA: Diagnosis not present

## 2018-09-19 DIAGNOSIS — R053 Chronic cough: Secondary | ICD-10-CM

## 2018-09-19 NOTE — Progress Notes (Signed)
Subjective:  Erin Kirk is a 62 y.o. year old very pleasant female patient who presents for/with See problem oriented charting ROS-no fevers, chills, nausea/vomiting.  Still with some cough but much improved  Past Medical History-  Patient Active Problem List   Diagnosis Date Noted  . Low vitamin B12 level 09/07/2016    Priority: High  . Disability examination 01/17/2015    Priority: High  . Type II diabetes mellitus, well controlled ([place]) 11/28/2010    Priority: High  . PAROXYSMAL ATRIAL FIBRILLATION 07/02/2010    Priority: High  . Primary hypercoagulable state ([place]) 11/30/2007    Priority: High  . Neuropathy due to medical condition ([place]) 11/09/2017    Priority: Medium  . Hx of adenomatous polyp of colon 04/10/2016    Priority: Medium  . CKD (chronic kidney disease), stage III ([place]) 09/05/2014    Priority: Medium  . Hypothyroidism 03/09/2007    Priority: Medium  . Essential hypertension 03/09/2007    Priority: Medium  . History of Melanoma 03/09/2007    Priority: Medium  . Encounter for therapeutic drug monitoring 10/26/2013    Priority: Low  . Left facial pain 09/24/2012    Priority: Low  . Oral mucosal lesion 09/24/2012    Priority: Low  . TRANSIENT DISORDER INITIATING/MAINTAINING SLEEP 04/02/2010    Priority: Low  . Overweight(278.02) 11/29/2009    Priority: Low  . LOW BACK PAIN 06/15/2008    Priority: Low  . ALLERGIC RHINITIS 03/09/2007    Priority: Low  . GERD 03/09/2007    Priority: Low  . Morbid obesity ([place]) 03/18/2018  . Long term (current) use of anticoagulants 06/02/2017    Medications- reviewed and updated Current Outpatient Medications  Medication Sig Dispense Refill  . acetaminophen (TYLENOL) 500 MG tablet Take 500 mg by mouth every 6 (six) hours as needed.    . Alum Hydroxide-Mag Carbonate (GAVISCON PO) Take by mouth.    . budesonide-formoterol (SYMBICORT) 160-4.5 MCG/ACT inhaler Inhale 2 puffs into the lungs 2 (two) times daily. 1 Inhaler 3   . diltiazem (CARDIZEM CD) 360 MG 24 hr capsule TAKE 1 CAPSULE BY MOUTH ONCE DAILY 90 capsule 3  . levothyroxine (SYNTHROID, LEVOTHROID) 100 MCG tablet TAKE 1 TABLET BY MOUTH ONCE DAILY 90 tablet 0  . metoprolol succinate (TOPROL-XL) 25 MG 24 hr tablet TAKE 1 TABLET BY MOUTH ONCE DAILY 90 tablet 1  . telmisartan (MICARDIS) 40 MG tablet Take 1 tablet (40 mg total) by mouth daily. 90 tablet 3  . triamterene-hydrochlorothiazide (MAXZIDE-25) 37.5-25 MG tablet TAKE 1 TABLET BY MOUTH ONCE DAILY 90 tablet 3  . vitamin B-12 (CYANOCOBALAMIN) 1000 MCG tablet Take 1,000 mcg by mouth daily.    . Vitamin D, Ergocalciferol, (DRISDOL) 50000 units CAPS capsule TAKE 1 CAPSULE BY MOUTH ONCE A WEEK 12 capsule 17  . warfarin (COUMADIN) 5 MG tablet TAKE AS DIRECTED BY MOUTH BY ANTICOAGULATION CLINIC 35 tablet 3  . levalbuterol (XOPENEX HFA) 45 MCG/ACT inhaler Inhale 1 puff into the lungs every 6 (six) hours as needed for wheezing. (Patient not taking: Reported on 09/19/2018) 1 Inhaler 12   No current facility-administered medications for this visit.     Objective: BP 118/70 (BP Location: Right Arm, Patient Position: Sitting, Cuff Size: Normal)   Pulse 81   Temp 97.9 F (36.6 C) (Oral)   Ht 5\' 4"  (1.626 m)   Wt 202 lb 9.6 oz (91.9 kg)   LMP  (LMP Unknown)   SpO2 96%   BMI 34.78 kg/m  Gen: NAD, resting comfortably Tympanic membranes normal, oropharynx normal CV: RRR no murmurs rubs or gallops Lungs: CTAB no crackles, wheeze, rhonchi Abdomen: soft/nontender/nondistended Ext: no edema Skin: warm, dry Neuro: speech normal  Assessment/Plan:   Chronic cough  S: Today, she reports still coughing some but "nothing like I was". Coughing up white foamy sputum instead of yellow/green. No other symptoms with it. She is using symbicort twice a day as prescribed by [name] and that seems to have really helped.   She states was told she had a touch of asthma. Has also had issues primarily in the winter  time with breathing she states.  A/P: 62 year old female with cough for nearly 2 months now-likely had bronchitis.  Seemed very responsive to prednisone and Symbicort-given prior diagnosis of possible pneumonia-we will keep her referral with pulmonology for their opinion-wonder about benefit of PFTs but will defer to them.  Since patient is feeling better she states she may not schedule this appointment as she has not yet been called-we discussed referring back in the future if symptoms recur/worsen even if she cancels present appointment   Type II diabetes mellitus, well controlled ([place]) We continue to have issues getting her updated diabetic eye exam-I encouraged her to sign a release of information at checkout once again.  She continues to do well without medication-off metformin due to B12 being low.  Need to repeat hemoglobin A1c at February visit    Future Appointments  Date Time Provider [place]  09/28/2018 10:00 AM [facility]  11/15/2018 10:40 AM [facility] [place] Brayton Mars, MD [facility]   Return precautions advised.  Garret Reddish, MD

## 2018-09-19 NOTE — Patient Instructions (Addendum)
Health Maintenance Due  Topic Date Due  . OPHTHALMOLOGY EXAM - Sign release of information at the check out desk for last diabetic eye exam   12/02/2016   Lungs sound great today! Hoping this will continue for you- glad you are doing better. Since you were so responsive to prednisone- we could consider another trial if needed.   Given history of winter issues as well as concern for asthma in the past- I think pulmonary referral is reasonable. If you prefer to hold off and do this another time if recurs in future- I also understand that.

## 2018-09-20 NOTE — Assessment & Plan Note (Signed)
We continue to have issues getting her updated diabetic eye exam-I encouraged her to sign a release of information at checkout once again.  She continues to do well without medication-off metformin due to B12 being low.  Need to repeat hemoglobin A1c at February visit

## 2018-09-28 ENCOUNTER — Ambulatory Visit (INDEPENDENT_AMBULATORY_CARE_PROVIDER_SITE_OTHER): Payer: Medicare Other | Admitting: General Practice

## 2018-09-28 DIAGNOSIS — Z7901 Long term (current) use of anticoagulants: Secondary | ICD-10-CM

## 2018-09-28 DIAGNOSIS — D6859 Other primary thrombophilia: Secondary | ICD-10-CM | POA: Diagnosis not present

## 2018-09-28 LAB — POCT INR: INR: 2.7 (ref 2.0–3.0)

## 2018-09-28 NOTE — Progress Notes (Signed)
I have reviewed and agree with note, evaluation, plan.   Stephen Hunter, MD  

## 2018-09-28 NOTE — Patient Instructions (Addendum)
Pre visit review using our clinic review tool, if applicable. No additional management support is needed unless otherwise documented below in the visit note.  Continue to take 1 tablet daily.  Re-check in 4 weeks.  

## 2018-10-14 ENCOUNTER — Encounter: Payer: Self-pay | Admitting: Internal Medicine

## 2018-10-14 ENCOUNTER — Ambulatory Visit (INDEPENDENT_AMBULATORY_CARE_PROVIDER_SITE_OTHER): Payer: Medicare Other | Admitting: Internal Medicine

## 2018-10-14 DIAGNOSIS — J45991 Cough variant asthma: Secondary | ICD-10-CM | POA: Diagnosis not present

## 2018-10-14 NOTE — Patient Instructions (Addendum)
Keep your inhaler on hand in the event that the breathing problem returns  Only use your xopenex as a rescue medication to be used if you can't catch your breath by resting or doing a relaxed purse lip breathing pattern.  - The less you use it, the better it will work when you need it. - Ok to use up to 2 puffs  every 4 hours if you must but call for immediate appointment if use goes up over your usual need - Don't leave home without it !!  (think of it like the spare tire for your car)   Pulmonary follow up is as needed

## 2018-10-14 NOTE — Progress Notes (Signed)
Erin Kirk, female    DOB: May 13, 1956,     MRN: [id number]   Brief patient profile:  1 yowf never smoker onset late 53s itchy/sneezy/ runny nose with dx of allergy to "everything green" with symptoms  And rx by Dr Bernita Buffy x > 10 years  Better but still needed some seasonal clariton then Aug 01 2018 acute onset sob /cough with ant chest discomfort with green mucus rx zpak then pred/levaquin / xopenex/symb with cxr suggesting ? bronchpna so referred to pulmonary clinic 10/14/2018 by [name].   History of Present Illness  10/14/2018  Pulmonary/ 1st office eval/Erin Kirk  Chief Complaint  Patient presents with  . Pulmonary Consult    Referred by [name], PA. Pt had been coughing off and on since Nov 2019. She was given round of prednisone in late Dec 2019 and this made her cough resolve. She has no co's today.   Dyspnea:  Back to nl capacity including housework, no steps/shopping fine better by 1st of year  Cough: no Sleep: on side bed is flat SABA use: none now , off symbicort weeks prior to OV    No obvious day to day or daytime variability or assoc excess/ purulent sputum or mucus plugs or hemoptysis or cp or chest tightness, subjective wheeze or overt sinus or hb symptoms.   Sleeping now  without nocturnal  or early am exacerbation  of respiratory  c/o's or need for noct saba. Also denies any obvious fluctuation of symptoms with weather or environmental changes or other aggravating or alleviating factors except as outlined above   No unusual exposure hx or h/o childhood pna/ asthma or knowledge of premature birth.  Current Allergies, Complete Past Medical History, Past Surgical History, Family History, and Social History were reviewed in [employer] record.  ROS  The following are not active complaints unless bolded Hoarseness, sore throat, dysphagia, dental problems, itching, sneezing,  nasal congestion or discharge of excess mucus or purulent  secretions, ear ache,   fever, chills, sweats, unintended wt loss or wt gain, classically pleuritic or exertional cp,  orthopnea pnd or arm/hand swelling  or leg swelling, presyncope, palpitations, abdominal pain, anorexia, nausea, vomiting, diarrhea  or change in bowel habits or change in bladder habits, change in stools or change in urine, dysuria, hematuria,  rash, arthralgias, visual complaints, headache, numbness, weakness or ataxia or problems with walking or coordination,  change in mood or  memory.            Past Medical History:  Diagnosis Date  . Allergy   . Anxiety   . Arthritis   . Asthma   . Blood transfusion without reported diagnosis   . Cancer ([place])     melanoma  . Cataract   . Clotting disorder ([place])   . Depression   . Diabetes ([place])   . GERD (gastroesophageal reflux disease)   . Hypertension   . Irregular heart beat   . Lupus anticoagulant with hypercoagulable state ([place])   . Primary hypercoagulable state ([place])   . Pulmonary embolism ([place])   . Thyroid disease    hypothyroidism    Outpatient Medications Prior to Visit  Medication Sig Dispense Refill  . acetaminophen (TYLENOL) 500 MG tablet Take 500 mg by mouth every 6 (six) hours as needed.    . Alum Hydroxide-Mag Carbonate (GAVISCON PO) Take by mouth.    . diltiazem (CARDIZEM CD) 360 MG 24 hr capsule TAKE 1 CAPSULE BY MOUTH  ONCE DAILY 90 capsule 3  . levalbuterol (XOPENEX HFA) 45 MCG/ACT inhaler Inhale 1 puff into the lungs every 6 (six) hours as needed for wheezing. 1 Inhaler 12  . levothyroxine (SYNTHROID, LEVOTHROID) 100 MCG tablet TAKE 1 TABLET BY MOUTH ONCE DAILY 90 tablet 0  . metoprolol succinate (TOPROL-XL) 25 MG 24 hr tablet TAKE 1 TABLET BY MOUTH ONCE DAILY 90 tablet 1  . telmisartan (MICARDIS) 40 MG tablet Take 1 tablet (40 mg total) by mouth daily. 90 tablet 3  . triamterene-hydrochlorothiazide (MAXZIDE-25) 37.5-25 MG tablet TAKE 1 TABLET BY MOUTH ONCE DAILY 90 tablet 3  . vitamin B-12  (CYANOCOBALAMIN) 1000 MCG tablet Take 1,000 mcg by mouth daily.    . Vitamin D, Ergocalciferol, (DRISDOL) 50000 units CAPS capsule TAKE 1 CAPSULE BY MOUTH ONCE A WEEK 12 capsule 17  . warfarin (COUMADIN) 5 MG tablet TAKE AS DIRECTED BY MOUTH BY ANTICOAGULATION CLINIC 35 tablet 3  . budesonide-formoterol (SYMBICORT) 160-4.5 MCG/ACT inhaler Inhale 2 puffs into the lungs 2 (two) times daily. 1 Inhaler 3         Objective:     BP 104/64 (BP Location: Left Arm, Cuff Size: Normal)   Pulse 76   Ht 5' 4.5" (1.638 m)   Wt 206 lb (93.4 kg)   LMP  (LMP Unknown)   SpO2 98%   BMI 34.81 kg/m   SpO2: 98 %  RA   amb pleasant wf nad   HEENT: nl dentition, turbinates bilaterally, and oropharynx. Nl external ear canals without cough reflex   NECK :  without JVD/Nodes/TM/ nl carotid upstrokes bilaterally   LUNGS: no acc muscle use,  Nl contour chest which is clear to A and P bilaterally without cough on insp or exp maneuvers   CV:  RRR  no s3 or murmur or increase in P2, and no edema   ABD:  soft and nontender with nl inspiratory excursion in the supine position. No bruits or organomegaly appreciated, bowel sounds nl  MS:  Nl gait/ ext warm without deformities, calf tenderness, cyanosis or clubbing No obvious joint restrictions   SKIN: warm and dry without lesions    NEURO:  alert, approp, nl sensorium with  no motor or cerebellar deficits apparent.       Assessment   No problem-specific Assessment & Plan notes found for this encounter.     Christinia Gully, MD 10/14/2018

## 2018-10-16 ENCOUNTER — Encounter: Payer: Self-pay | Admitting: Internal Medicine

## 2018-10-16 NOTE — Assessment & Plan Note (Signed)
Onset 08/02/19/19 > resolved by 09/21/2018 - Spirometry 10/14/2018  FEV1 1.7 (68%)  Ratio 69 with mild curvature - 10/14/2018  After extensive coaching inhaler device,  effectiveness =    75%   She likely has mild chronic asthma with intermittent flares but so far the only recent trigger = apparent viral uri with complete resolution of symptoms p short course of prednisone.  Based on two studies from NEJM  378; 20 p 1865 (2018) and 380 : p2020-30 (2019) in pts with mild asthma it is reasonable to use low dose symbicort eg 80 2bid "prn" flare in this setting but I emphasized this was only shown with symbicort and takes advantage of the rapid onset of action but is not the same as "rescue therapy" but can be stopped once the acute symptoms have resolved and the need for rescue has been minimized (< 2 x weekly)    Advised: If your breathing worsens or you need to use your rescue inhaler more than twice weekly or wake up more than twice a month with any respiratory symptoms or require more than two rescue inhalers per year, we need to see you right away because this means we're not controlling the underlying problem (inflammation) adequately.  Rescue inhalers (albuterol) do not control inflammation and overuse can lead to unnecessary and costly consequences.  They can make you feel better temporarily but eventually they will quit working effectively much as sleep aids lead to more insomnia if used regularly.     Total time devoted to counseling  > 50 % of initial 45 min office visit:  review case with pt/  device teaching which extended face to face time for this visit  discussion of options/alternatives/ personally creating written customized instructions  in presence of pt  then going over those specific  Instructions directly with the pt including how to use all of the meds but in particular covering each new medication in detail and the difference between the maintenance= "automatic" meds and the prns  using an action plan format for the latter (If this problem/symptom => do that organization reading Left to right).  Please see AVS from this visit for a full list of these instructions which I personally wrote for this pt and  are unique to this visit.

## 2018-10-26 ENCOUNTER — Ambulatory Visit (INDEPENDENT_AMBULATORY_CARE_PROVIDER_SITE_OTHER): Payer: Medicare Other | Admitting: General Practice

## 2018-10-26 DIAGNOSIS — Z7901 Long term (current) use of anticoagulants: Secondary | ICD-10-CM | POA: Diagnosis not present

## 2018-10-26 DIAGNOSIS — D6859 Other primary thrombophilia: Secondary | ICD-10-CM

## 2018-10-26 LAB — POCT INR: INR: 3.2 — AB (ref 2.0–3.0)

## 2018-10-26 NOTE — Patient Instructions (Addendum)
Pre visit review using our clinic review tool, if applicable. No additional management support is needed unless otherwise documented below in the visit note.  Continue to take 1 tablet daily.  Re-check in 6 weeks.  

## 2018-10-26 NOTE — Progress Notes (Signed)
I have reviewed and agree with note, evaluation, plan.   Stephen Hunter, MD  

## 2018-11-15 ENCOUNTER — Ambulatory Visit: Payer: Medicare Other | Admitting: Family Medicine

## 2018-12-04 ENCOUNTER — Other Ambulatory Visit: Payer: Self-pay | Admitting: Family Medicine

## 2018-12-07 ENCOUNTER — Other Ambulatory Visit: Payer: Self-pay | Admitting: Family Medicine

## 2018-12-07 ENCOUNTER — Ambulatory Visit (INDEPENDENT_AMBULATORY_CARE_PROVIDER_SITE_OTHER): Payer: Medicare Other | Admitting: General Practice

## 2018-12-07 ENCOUNTER — Other Ambulatory Visit: Payer: Self-pay

## 2018-12-07 DIAGNOSIS — Z7901 Long term (current) use of anticoagulants: Secondary | ICD-10-CM | POA: Diagnosis not present

## 2018-12-07 DIAGNOSIS — D6859 Other primary thrombophilia: Secondary | ICD-10-CM

## 2018-12-07 LAB — POCT INR: INR: 2.9 (ref 2.0–3.0)

## 2018-12-07 NOTE — Patient Instructions (Addendum)
Pre visit review using our clinic review tool, if applicable. No additional management support is needed unless otherwise documented below in the visit note.  Continue to take 1 tablet daily.  Re-check in 6 weeks.  

## 2018-12-07 NOTE — Progress Notes (Signed)
I have reviewed and agree with note, evaluation, plan.   Ashkan Chamberland, MD  

## 2018-12-12 ENCOUNTER — Other Ambulatory Visit: Payer: Self-pay | Admitting: Family Medicine

## 2018-12-12 ENCOUNTER — Other Ambulatory Visit: Payer: Self-pay

## 2018-12-28 ENCOUNTER — Ambulatory Visit: Payer: Medicare Other | Admitting: Family Medicine

## 2019-01-03 ENCOUNTER — Other Ambulatory Visit: Payer: Self-pay | Admitting: Family Medicine

## 2019-01-04 ENCOUNTER — Ambulatory Visit (INDEPENDENT_AMBULATORY_CARE_PROVIDER_SITE_OTHER): Payer: Medicare Other | Admitting: Podiatry

## 2019-01-04 ENCOUNTER — Other Ambulatory Visit: Payer: Self-pay

## 2019-01-04 ENCOUNTER — Ambulatory Visit (INDEPENDENT_AMBULATORY_CARE_PROVIDER_SITE_OTHER): Payer: Medicare Other

## 2019-01-04 VITALS — BP 116/67 | HR 78 | Temp 98.1°F

## 2019-01-04 DIAGNOSIS — M722 Plantar fascial fibromatosis: Secondary | ICD-10-CM

## 2019-01-04 MED ORDER — METHYLPREDNISOLONE 4 MG PO TBPK: ORAL_TABLET | ORAL | 0 refills | Status: DC

## 2019-01-04 NOTE — Patient Instructions (Signed)

## 2019-01-04 NOTE — Progress Notes (Signed)
   Subjective: 63 y.o. female presents the office today for pain in the left heel that is been going on approximately 3-4 weeks now.  She denies injury.  She has been taking Tylenol and soaking her foot in Epsom salt and doing stretching exercises that have not helped.  Currently the pain is 5/10 however at the end of the day after walking on it the pain is 10/10.   Past Medical History:  Diagnosis Date  . Allergy   . Anxiety   . Arthritis   . Asthma   . Blood transfusion without reported diagnosis   . Cancer ([place])     melanoma  . Cataract   . Clotting disorder ([place])   . Depression   . Diabetes ([place])   . GERD (gastroesophageal reflux disease)   . Hypertension   . Irregular heart beat   . Lupus anticoagulant with hypercoagulable state ([place])   . Primary hypercoagulable state ([place])   . Pulmonary embolism ([place])   . Thyroid disease    hypothyroidism     Objective: Physical Exam General: The patient is alert and oriented x3 in no acute distress.  Dermatology: Skin is warm, dry and supple bilateral lower extremities. Negative for open lesions or macerations bilateral.   Vascular: Dorsalis Pedis and Posterior Tibial pulses palpable bilateral.  Capillary fill time is immediate to all digits.  Neurological: Epicritic and protective threshold intact bilateral.   Musculoskeletal: Tenderness to palpation to the plantar aspect of the left heel along the plantar fascia. All other joints range of motion within normal limits bilateral. Strength 5/5 in all groups bilateral.   Radiographic exam: Normal osseous mineralization. Joint spaces preserved. No fracture/dislocation/boney destruction. No other soft tissue abnormalities or radiopaque foreign bodies.   Assessment: 1. Plantar fasciitis left foot  Plan of Care:  1. Patient evaluated. Xrays reviewed.   2. Injection of 0.5cc Celestone soluspan injected into the left plantar fascia.  3. Rx for Medrol Dose Pak placed 4.  Patient is  currently on anticoagulant medication.  She cannot take NSAIDs.  Continue OTC extra strength Tylenol 5. Plantar fascial band(s) dispensed  6. Instructed patient regarding therapies and modalities at home to alleviate symptoms.  7. Return to clinic in 4 weeks.     Edrick Kins, DPM [facility]  Dr. Edrick Kins, DPM    [address], [place] [address]                Office [phone]  Fax [phone]

## 2019-01-05 ENCOUNTER — Other Ambulatory Visit: Payer: Self-pay | Admitting: Family Medicine

## 2019-01-05 MED ORDER — METOPROLOL SUCCINATE ER 25 MG PO TB24: 25.0000 mg | ORAL_TABLET | Freq: Every day | ORAL | 1 refills | Status: DC

## 2019-01-05 MED ORDER — TELMISARTAN 40 MG PO TABS: 40.0000 mg | ORAL_TABLET | Freq: Every day | ORAL | 1 refills | Status: DC

## 2019-01-09 ENCOUNTER — Other Ambulatory Visit: Payer: Self-pay | Admitting: *Deleted

## 2019-01-09 MED ORDER — DILTIAZEM HCL ER COATED BEADS 360 MG PO CP24: 360.0000 mg | ORAL_CAPSULE | Freq: Every day | ORAL | 0 refills | Status: DC

## 2019-01-18 ENCOUNTER — Other Ambulatory Visit: Payer: Self-pay

## 2019-01-18 ENCOUNTER — Ambulatory Visit (INDEPENDENT_AMBULATORY_CARE_PROVIDER_SITE_OTHER): Payer: Medicare Other | Admitting: General Practice

## 2019-01-18 DIAGNOSIS — D6859 Other primary thrombophilia: Secondary | ICD-10-CM

## 2019-01-18 DIAGNOSIS — Z7901 Long term (current) use of anticoagulants: Secondary | ICD-10-CM

## 2019-01-18 LAB — POCT INR: INR: 4.4 — AB (ref 2.0–3.0)

## 2019-01-18 NOTE — Progress Notes (Signed)
I have reviewed and agree with note, evaluation, plan.   Stephen Hunter, MD  

## 2019-01-18 NOTE — Patient Instructions (Addendum)
Pre visit review using our clinic review tool, if applicable. No additional management support is needed unless otherwise documented below in the visit note.  Hold dosage today (4/29) and take 1/2 dose tomorrow (4/30) and then continue to take 1 tablet daily.   Re-check in 6 weeks.

## 2019-02-01 ENCOUNTER — Ambulatory Visit: Payer: Medicare Other | Admitting: Podiatry

## 2019-03-01 ENCOUNTER — Other Ambulatory Visit: Payer: Self-pay

## 2019-03-01 ENCOUNTER — Ambulatory Visit (INDEPENDENT_AMBULATORY_CARE_PROVIDER_SITE_OTHER): Payer: Medicare Other | Admitting: General Practice

## 2019-03-01 DIAGNOSIS — Z7901 Long term (current) use of anticoagulants: Secondary | ICD-10-CM

## 2019-03-01 DIAGNOSIS — D6859 Other primary thrombophilia: Secondary | ICD-10-CM

## 2019-03-01 LAB — POCT INR: INR: 2.7 (ref 2.0–3.0)

## 2019-03-01 NOTE — Progress Notes (Signed)
I have reviewed and agree with note, evaluation, plan.   Stephen Hunter, MD  

## 2019-03-01 NOTE — Patient Instructions (Signed)
Pre visit review using our clinic review tool, if applicable. No additional management support is needed unless otherwise documented below in the visit note.  Continue to take 1 tablet daily.  Re-check in 6 weeks.  

## 2019-03-09 ENCOUNTER — Encounter: Payer: Self-pay | Admitting: Family Medicine

## 2019-03-09 ENCOUNTER — Ambulatory Visit (INDEPENDENT_AMBULATORY_CARE_PROVIDER_SITE_OTHER): Payer: Medicare Other | Admitting: Family Medicine

## 2019-03-09 VITALS — BP 122/84 | HR 86 | Ht 64.5 in | Wt 207.0 lb

## 2019-03-09 DIAGNOSIS — N183 Chronic kidney disease, stage 3 unspecified: Secondary | ICD-10-CM

## 2019-03-09 DIAGNOSIS — E1159 Type 2 diabetes mellitus with other circulatory complications: Secondary | ICD-10-CM

## 2019-03-09 DIAGNOSIS — E039 Hypothyroidism, unspecified: Secondary | ICD-10-CM | POA: Diagnosis not present

## 2019-03-09 DIAGNOSIS — E1169 Type 2 diabetes mellitus with other specified complication: Secondary | ICD-10-CM

## 2019-03-09 DIAGNOSIS — E538 Deficiency of other specified B group vitamins: Secondary | ICD-10-CM

## 2019-03-09 DIAGNOSIS — E1122 Type 2 diabetes mellitus with diabetic chronic kidney disease: Secondary | ICD-10-CM

## 2019-03-09 DIAGNOSIS — I129 Hypertensive chronic kidney disease with stage 1 through stage 4 chronic kidney disease, or unspecified chronic kidney disease: Secondary | ICD-10-CM | POA: Diagnosis not present

## 2019-03-09 DIAGNOSIS — E785 Hyperlipidemia, unspecified: Secondary | ICD-10-CM

## 2019-03-09 NOTE — Progress Notes (Signed)
Phone [phone]   Subjective:  Virtual visit via phonenote Chief Complaint  Patient presents with  . Follow-up  . Diabetes  . Hypertension  . Hypothyroidism  . Chronic Kidney Disease  . Atrial Fibrillation  . B12 Deficiency    This visit type was conducted due to national recommendations for restrictions regarding the COVID-19 Pandemic (e.g. social distancing).  This format is felt to be most appropriate for this patient at this time balancing risks to patient and risks to population by having him in for in person visit.  All issues noted in this document were discussed and addressed.  No physical exam was performed (except for noted visual exam or audio findings with Telehealth visits).  The patient has consented to conduct a Telehealth visit and understands insurance will be billed.   Our team/I connected with [name] at 10:40 AM EDT by phone (patient did not have equipment for webex) and verified that I am speaking with the correct person using two identifiers.  Location patient: [facility] Location provider: [facility], office Persons participating in the virtual visit:  patient  Time on phone: 23 minutes Counseling provided about diabetes/exercise/healthy eating  Our team/I discussed the limitations of evaluation and management by telemedicine and the availability of in person appointments. In light of current covid-19 pandemic, patient also understands that we are trying to protect them by minimizing in office contact if at all possible.  The patient expressed consent for telemedicine visit and agreed to proceed. Patient understands insurance will be billed.   ROS- No fever, chills, cough, shortness of breath, body aches, sore throat, or loss of taste or smell . No known covid 19 contacts.   Past Medical History-  Patient Active Problem List   Diagnosis Date Noted  . Low vitamin B12 level 09/07/2016    Priority: High  . Disability examination 01/17/2015    Priority:  High  . Controlled type 2 diabetes with renal manifestation ([place]) 11/28/2010    Priority: High  . PAROXYSMAL ATRIAL FIBRILLATION 07/02/2010    Priority: High  . Primary hypercoagulable state ([place]) 11/30/2007    Priority: High  . Hyperlipidemia associated with type 2 diabetes mellitus ([place]) 03/09/2019    Priority: Medium  . Neuropathy due to medical condition ([place]) 11/09/2017    Priority: Medium  . Hx of adenomatous polyp of colon 04/10/2016    Priority: Medium  . CKD (chronic kidney disease), stage III ([place]) 09/05/2014    Priority: Medium  . Hypothyroidism 03/09/2007    Priority: Medium  . Hypertension associated with diabetes ([place]) 03/09/2007    Priority: Medium  . History of Melanoma 03/09/2007    Priority: Medium  . Encounter for therapeutic drug monitoring 10/26/2013    Priority: Low  . Left facial pain 09/24/2012    Priority: Low  . Oral mucosal lesion 09/24/2012    Priority: Low  . TRANSIENT DISORDER INITIATING/MAINTAINING SLEEP 04/02/2010    Priority: Low  . Overweight(278.02) 11/29/2009    Priority: Low  . LOW BACK PAIN 06/15/2008    Priority: Low  . ALLERGIC RHINITIS 03/09/2007    Priority: Low  . GERD 03/09/2007    Priority: Low  . Cough variant asthma 10/14/2018  . Morbid obesity ([place]) 03/18/2018  . Long term (current) use of anticoagulants 06/02/2017   Medications- reviewed and updated Current Outpatient Medications  Medication Sig Dispense Refill  . acetaminophen (TYLENOL) 500 MG tablet Take 500 mg by mouth every 6 (six) hours as needed.    Marland Kitchen  Alum Hydroxide-Mag Carbonate (GAVISCON PO) Take by mouth.    . diltiazem (CARDIZEM CD) 360 MG 24 hr capsule Take 1 capsule (360 mg total) by mouth daily. 90 capsule 0  . levothyroxine (SYNTHROID, LEVOTHROID) 100 MCG tablet Take 1 tablet by mouth once daily 90 tablet 0  . metoprolol succinate (TOPROL-XL) 25 MG 24 hr tablet Take 1 tablet (25 mg total) by mouth daily. 90 tablet 1  . telmisartan (MICARDIS) 40 MG tablet  Take 1 tablet (40 mg total) by mouth daily. 90 tablet 1  . triamterene-hydrochlorothiazide (MAXZIDE-25) 37.5-25 MG tablet TAKE 1 TABLET BY MOUTH ONCE DAILY 90 tablet 3  . vitamin B-12 (CYANOCOBALAMIN) 1000 MCG tablet Take 1,000 mcg by mouth daily.    . Vitamin D, Ergocalciferol, (DRISDOL) 50000 units CAPS capsule TAKE 1 CAPSULE BY MOUTH ONCE A WEEK 12 capsule 17  . warfarin (COUMADIN) 5 MG tablet TAKE AS DIRECTED BY MOUTH BY ANTICOAGULATION  CLINIC 30 day 35 tablet 3  . levalbuterol (XOPENEX HFA) 45 MCG/ACT inhaler Inhale 1 puff into the lungs every 6 (six) hours as needed for wheezing. (Patient not taking: Reported on 03/09/2019) 1 Inhaler 12   No current facility-administered medications for this visit.      Objective:  BP 122/84   Pulse 86   Ht 5' 4.5" (1.638 m)   Wt 207 lb (93.9 kg)   LMP  (LMP Unknown)   BMI 34.98 kg/m  self reported vitals  Nonlabored voice, normal speech     Assessment and Plan   # Diabetes/obesity S: Controlled with diet and exercises.  CBGs- FBG on average is in the 90s.  Exercise and diet- Tried to watch carb and sugar intake. Hasn't been as good during pandemic- but recently has turned this around. Has been staying active around the house- has started back on exercise bike. Denies hypoglycemic episodes.  Lab Results  Component Value Date   HGBA1C 6.7 (H) 07/19/2018   HGBA1C 6.2 (A) 03/18/2018   HGBA1C 6.5 11/09/2017   A/P: hopefully well controlled- will have her come by for labs For obesity- Encouraged need for healthy eating, regular exercise, weight loss.   #hypertension/CKD III S: controlled on Metoprolol XL 25 mg, Telmisartan 40 mg, Diltiazem 360 mg, and Triamterene-HCT 37.5-25 mg daily. Taking as prescribed, no side effects. Denies HA, dizziness, visual changes, CP, SOB. Watches salt intake, doesn't add salt to food.   GFR mainly in 50s  BP Readings from Last 3 Encounters:  03/09/19 122/84  01/04/19 116/67  10/14/18 104/64  A/P: Stable.  Continue current medications.  Hopefully kidneys stable- update labs -may need to change to IR diltiazem or amlodipine  #hypothyroidism S: On thyroid medication-Levothyroxine 100 mcg daily.   Lab Results  Component Value Date   TSH 1.95 07/19/2018   A/P: hopefully stable- need to update tsh with labs  # Vitamin B12 deficiency  S:B12 injections in the past, currently taking oral supplement now for 2 years. Not feeling fatigued or lethargic.  Lab Results  Component Value Date   VITAMINB12 >1525 (H) 07/19/2018  A/P: hopefully stable- l will need to update b12-   #hyperlipidemia S: honestly well controlled on no medicine but based on recent data about diabetes and statins- would still recommend even super low dose like lovastatin 10mg  once a week Lab Results  Component Value Date   CHOL 127 07/19/2018   HDL 51.70 07/19/2018   LDLCALC 57 07/19/2018   LDLDIRECT 37.0 12/23/2016   TRIG 89.0 07/19/2018   CHOLHDL  2 07/19/2018   A/P: patient wants to check LDL first and see where things stand and then consider this addition.   #social update- sisters son died- aneurysm in abdomen ultimately. Patient struggling with this.    Physical 4-6 months Future Appointments  Date Time Provider [place]  04/12/2019 10:00 AM [facility]   Lab/Order associations:   ICD-10-CM   1. CKD (chronic kidney disease), stage III (HCC)  N18.3   2. Controlled type 2 diabetes mellitus with stage 3 chronic kidney disease, without long-term current use of insulin (HCC)  E11.22    N18.3   3. Hypothyroidism, unspecified type  E03.9   4. Hypertension associated with diabetes ([place])  E11.59    I10   5. Hyperlipidemia associated with type 2 diabetes mellitus ([place])  E11.69    E78.5     No orders of the defined types were placed in this encounter.   Return precautions advised.  Garret Reddish, MD

## 2019-03-09 NOTE — Patient Instructions (Addendum)
Health Maintenance Due  Topic Date Due  . OPHTHALMOLOGY EXAM - See's [facility] 12/02/2016  . HEMOGLOBIN A1C  01/18/2019  . DEXA SCAN - declines 02/18/2019  . PNA vac Low Risk Adult (1 of 2 - PCV13) 02/18/2019

## 2019-03-09 NOTE — Assessment & Plan Note (Signed)
S: honestly well controlled on no medicine but based on recent data about diabetes and statins- would still recommend even super low dose like lovastatin 10mg  once a week Lab Results  Component Value Date   CHOL 127 07/19/2018   HDL 51.70 07/19/2018   LDLCALC 57 07/19/2018   LDLDIRECT 37.0 12/23/2016   TRIG 89.0 07/19/2018   CHOLHDL 2 07/19/2018   A/P: patient wants to check LDL first and see where things stand and then consider this addition.

## 2019-03-10 ENCOUNTER — Other Ambulatory Visit (INDEPENDENT_AMBULATORY_CARE_PROVIDER_SITE_OTHER): Payer: Medicare Other

## 2019-03-10 DIAGNOSIS — E1122 Type 2 diabetes mellitus with diabetic chronic kidney disease: Secondary | ICD-10-CM | POA: Diagnosis not present

## 2019-03-10 DIAGNOSIS — N183 Chronic kidney disease, stage 3 unspecified: Secondary | ICD-10-CM

## 2019-03-10 DIAGNOSIS — E538 Deficiency of other specified B group vitamins: Secondary | ICD-10-CM

## 2019-03-10 DIAGNOSIS — R7989 Other specified abnormal findings of blood chemistry: Secondary | ICD-10-CM

## 2019-03-10 LAB — COMPREHENSIVE METABOLIC PANEL
ALT: 18 U/L (ref 0–35)
AST: 14 U/L (ref 0–37)
Albumin: 4.3 g/dL (ref 3.5–5.2)
Alkaline Phosphatase: 61 U/L (ref 39–117)
BUN: 16 mg/dL (ref 6–23)
CO2: 26 mEq/L (ref 19–32)
Calcium: 8.8 mg/dL (ref 8.4–10.5)
Chloride: 102 mEq/L (ref 96–112)
Creatinine, Ser: 1.02 mg/dL (ref 0.40–1.20)
GFR: 54.38 mL/min — ABNORMAL LOW (ref >60.00)
Glucose, Bld: 130 mg/dL — ABNORMAL HIGH (ref 70–99)
Potassium: 3.8 mEq/L (ref 3.5–5.1)
Sodium: 138 mEq/L (ref 135–145)
Total Bilirubin: 0.6 mg/dL (ref 0.2–1.2)
Total Protein: 6.4 g/dL (ref 6.0–8.3)

## 2019-03-10 LAB — CBC
HCT: 30.7 % — ABNORMAL LOW (ref 36.0–46.0)
Hemoglobin: 9.9 g/dL — ABNORMAL LOW (ref 12.0–15.0)
MCHC: 32.2 g/dL (ref 30.0–36.0)
MCV: 74.7 fl — ABNORMAL LOW (ref 78.0–100.0)
Platelets: 229 10*3/uL (ref 150.0–400.0)
RBC: 4.11 Mil/uL (ref 3.87–5.11)
RDW: 16.9 % — ABNORMAL HIGH (ref 11.5–15.5)
WBC: 5.8 10*3/uL (ref 4.0–10.5)

## 2019-03-10 LAB — VITAMIN B12: Vitamin B-12: >1515 pg/mL — ABNORMAL HIGH (ref 211–911)

## 2019-03-10 LAB — LDL CHOLESTEROL, DIRECT: Direct LDL: 39 mg/dL

## 2019-03-13 LAB — HEMOGLOBIN A1C: Hgb A1c MFr Bld: 7.1 % — ABNORMAL HIGH (ref 4.6–6.5)

## 2019-03-15 ENCOUNTER — Other Ambulatory Visit: Payer: Self-pay

## 2019-03-15 ENCOUNTER — Other Ambulatory Visit: Payer: Self-pay | Admitting: Family Medicine

## 2019-03-15 DIAGNOSIS — E611 Iron deficiency: Secondary | ICD-10-CM

## 2019-03-15 DIAGNOSIS — E1122 Type 2 diabetes mellitus with diabetic chronic kidney disease: Secondary | ICD-10-CM

## 2019-03-15 MED ORDER — SITAGLIPTIN PHOSPHATE 100 MG PO TABS: 100.0000 mg | ORAL_TABLET | Freq: Every day | ORAL | 3 refills | Status: DC

## 2019-03-15 MED ORDER — LOVASTATIN 10 MG PO TABS: 10.0000 mg | ORAL_TABLET | Freq: Every day | ORAL | 1 refills | Status: DC

## 2019-03-17 ENCOUNTER — Telehealth: Payer: Self-pay | Admitting: Family Medicine

## 2019-03-17 NOTE — Telephone Encounter (Signed)
Katrina from [facility].  States that Dr. Jerline Pain RXd Lovastatin and there is a possible drug interaction with Diltiazem - they need to know if it okay to go ahead and fill. Katrina can be reached at [phone].

## 2019-03-17 NOTE — Telephone Encounter (Signed)
Copied from [place] [id number]. Topic: General - Other >> Mar 17, 2019  4:35 PM Rainey Pines A wrote: Patient would like a callback in regards to her medications. Patient stated that she was informed by the pharmacy that the deltison interferes with the lovastatin and patient also has questions about the [place].

## 2019-03-17 NOTE — Telephone Encounter (Signed)
Dr. Hunter please advise. 

## 2019-03-20 ENCOUNTER — Other Ambulatory Visit: Payer: Self-pay

## 2019-03-20 DIAGNOSIS — E538 Deficiency of other specified B group vitamins: Secondary | ICD-10-CM

## 2019-03-20 DIAGNOSIS — E1122 Type 2 diabetes mellitus with diabetic chronic kidney disease: Secondary | ICD-10-CM

## 2019-03-20 MED ORDER — METFORMIN HCL 500 MG PO TABS: ORAL_TABLET | ORAL | 3 refills | Status: DC

## 2019-03-20 NOTE — Telephone Encounter (Signed)
FYI-see below- 

## 2019-03-20 NOTE — Telephone Encounter (Signed)
Also if she prefers to hold off on metformin and really push on healthy eating and regular exercise that is okay as well.  I am okay waiting 14 weeks and then rechecking.

## 2019-03-20 NOTE — Telephone Encounter (Signed)
Hi Dr. Yong Channel I spoke with Erin Kirk and her concern with the Erin Kirk is that it is very costly and she can not afford the medication. She stated she thought that she would not have to start diabetes medication until after her 14 week check up where her HgbA1c would be checked. She stated if possible she would like to take metformin but in  a small dose. She also stated she has been working out more frequently and she guarantees you will see a difference in her numbers.

## 2019-03-20 NOTE — Telephone Encounter (Signed)
May discontinue Januvia.  May start metformin 500 mg tablet-take half tablet daily #45 with 3 refills- when we see her back we will need to check B12-make sure she is continuing oral B12 while on metformin.

## 2019-03-20 NOTE — Telephone Encounter (Signed)
Left VM letting patient know we were going to d/c Januvia and she may start merformin 500mg  taking 1/2 tablet daily if she would like or if she preferred to hold off on metformin until her 14 week check we could do that as well. I also informed her to continue Vitamin b12. Awaiting call back

## 2019-03-20 NOTE — Telephone Encounter (Signed)
Great - thanks

## 2019-03-20 NOTE — Telephone Encounter (Signed)
Spoke with patient she would like to take low dose of metformin. Metformin was sent to pharmacy for 500mg  take half daily #45 with 3 refills.  Hart Robinsons was d/c, and b12 lab order was put in for 03/29/19 visit

## 2019-03-20 NOTE — Telephone Encounter (Signed)
Diltiazem increases the amount of lovastatin in the blood but once again that is why we are using such a low dose and such an infrequent dose.  What are the questions about Januvia?

## 2019-03-29 ENCOUNTER — Other Ambulatory Visit (INDEPENDENT_AMBULATORY_CARE_PROVIDER_SITE_OTHER): Payer: Medicare Other

## 2019-03-29 ENCOUNTER — Telehealth: Payer: Self-pay

## 2019-03-29 ENCOUNTER — Other Ambulatory Visit: Payer: Self-pay

## 2019-03-29 ENCOUNTER — Telehealth: Payer: Self-pay | Admitting: Family Medicine

## 2019-03-29 DIAGNOSIS — E1122 Type 2 diabetes mellitus with diabetic chronic kidney disease: Secondary | ICD-10-CM

## 2019-03-29 DIAGNOSIS — E611 Iron deficiency: Secondary | ICD-10-CM

## 2019-03-29 DIAGNOSIS — N183 Chronic kidney disease, stage 3 (moderate): Secondary | ICD-10-CM | POA: Diagnosis not present

## 2019-03-29 DIAGNOSIS — E538 Deficiency of other specified B group vitamins: Secondary | ICD-10-CM | POA: Diagnosis not present

## 2019-03-29 LAB — CBC WITH DIFFERENTIAL/PLATELET
Basophils Absolute: 0.1 10*3/uL (ref 0.0–0.1)
Basophils Relative: 1.4 % (ref 0.0–3.0)
Eosinophils Absolute: 0.3 10*3/uL (ref 0.0–0.7)
Eosinophils Relative: 6.2 % — ABNORMAL HIGH (ref 0.0–5.0)
HCT: 30.1 % — ABNORMAL LOW (ref 36.0–46.0)
Hemoglobin: 9.8 g/dL — ABNORMAL LOW (ref 12.0–15.0)
Lymphocytes Relative: 29.6 % (ref 12.0–46.0)
Lymphs Abs: 1.7 10*3/uL (ref 0.7–4.0)
MCHC: 32.4 g/dL (ref 30.0–36.0)
MCV: 74.7 fl — ABNORMAL LOW (ref 78.0–100.0)
Monocytes Absolute: 0.4 10*3/uL (ref 0.1–1.0)
Monocytes Relative: 7.9 % (ref 3.0–12.0)
Neutro Abs: 3.1 10*3/uL (ref 1.4–7.7)
Neutrophils Relative %: 54.9 % (ref 43.0–77.0)
Platelets: 239 10*3/uL (ref 150.0–400.0)
RBC: 4.04 Mil/uL (ref 3.87–5.11)
RDW: 17.1 % — ABNORMAL HIGH (ref 11.5–15.5)
WBC: 5.6 10*3/uL (ref 4.0–10.5)

## 2019-03-29 LAB — FERRITIN: Ferritin: 5.1 ng/mL — ABNORMAL LOW (ref 10.0–291.0)

## 2019-03-29 LAB — VITAMIN B12: Vitamin B-12: >1515 pg/mL — ABNORMAL HIGH (ref 211–911)

## 2019-03-29 NOTE — Telephone Encounter (Signed)
I have emailed charge corrections to resubmit the claims. No need to route, only for documentation purposes.

## 2019-03-29 NOTE — Telephone Encounter (Signed)
Covid-19 screening questions   Do you now or have you had a fever in the last 14 days no   Do you have any respiratory symptoms of shortness of breath or cough now or in the last 14 days no  Do you have any family members or close contacts with diagnosed or suspected Covid-19 in the past 14 days no  Have you been tested for Covid-19 and found to be positive no          

## 2019-03-29 NOTE — Telephone Encounter (Signed)
-----   Message from Quentin Mulling sent at 03/29/2019  9:43 AM EDT ----- Please email Charge Corrections to resubmit the claim for the following:   Patient Name:Erin Kirk  BXI:[id number] DOB:1956/09/05 Date of Service: 03/01/2019, 03/09/2019, 03/10/2019 Insurance [place] ID #:[id number] Patient only wants this insurance filled not the old uhc  Patient was advised that this process can take up to a week or more. Patient also was advised that they may receive additional bills while this is being looked into and they are to hold onto all statements until resolved.

## 2019-03-30 ENCOUNTER — Ambulatory Visit (INDEPENDENT_AMBULATORY_CARE_PROVIDER_SITE_OTHER): Payer: Medicare Other | Admitting: Internal Medicine

## 2019-03-30 ENCOUNTER — Telehealth: Payer: Self-pay

## 2019-03-30 ENCOUNTER — Encounter: Payer: Self-pay | Admitting: Internal Medicine

## 2019-03-30 DIAGNOSIS — D509 Iron deficiency anemia, unspecified: Secondary | ICD-10-CM | POA: Insufficient documentation

## 2019-03-30 DIAGNOSIS — I480 Paroxysmal atrial fibrillation: Secondary | ICD-10-CM

## 2019-03-30 DIAGNOSIS — D6859 Other primary thrombophilia: Secondary | ICD-10-CM

## 2019-03-30 DIAGNOSIS — Z7901 Long term (current) use of anticoagulants: Secondary | ICD-10-CM | POA: Diagnosis not present

## 2019-03-30 HISTORY — DX: Iron deficiency anemia, unspecified: D50.9

## 2019-03-30 NOTE — Assessment & Plan Note (Signed)
Given this along with her hypercoagulable state and previous thromboses I think this increases her risk of events having both thromboses and A. fib so enoxaparin bridging is recommended during holding her anticoagulation.

## 2019-03-30 NOTE — Progress Notes (Signed)
Erin Kirk 63 y.o. 1956-05-29 [id number]  Assessment & Plan:  Iron deficiency anemia This should be considered blood loss anemia until proven otherwise.  I think it unlikely that there is a significant lesion in the colon given the relatively recent exam but I think it is worth repeating a colonoscopy.  Pediatric colonoscope will be used due to the angulated sigmoid colon.  She certainly could have had some discomfort after that but it sounds like there was a whole lot more going on rather than discomfort, problems after colonoscopy.  Metformin and B12 deficiency would be implicated and I had a good conversation with her about the problems that in soon after her colonoscopy and the timing and the documentation that show she did not come back to me and that the time course goes against it being a colonoscopy complication though I admitted something could have been related.  She seemed satisfied with this.   Upper GI endoscopy is appropriate as well.  Strongly consider small bowel biopsies to exclude celiac disease which is in the differential though I still feel compelled to rule out a blood loss.  Given her multiple thrombosis events including PE I think enoxaparin/Lovenox bridging is appropriate as we did in the past and will ask Dr. Yong Channel and his anticoagulation team for help on that.  Primary hypercoagulable state lovenox bridge needed I think  Long term (current) use of anticoagulants - warfarin Hold warfarin Lovenox bridge Stroke/PE rsik discussed  PAROXYSMAL ATRIAL FIBRILLATION Given this along with her hypercoagulable state and previous thromboses I think this increases her risk of events having both thromboses and A. fib so enoxaparin bridging is recommended during holding her anticoagulation.      Subjective:   Chief Complaint: Iron deficiency anemia  HPI The patient is a 63 year old white woman with iron deficiency anemia, seen by me in 2017 for colon cancer  screening, she had a colonoscopy in July of that year and a 9 mm adenoma was removed and there was a recommendation for a repeat colonoscopy in 2022.  She proceeded to tell me at length today that she had a lot of trouble after that colonoscopy, and it was her recollection that she came back here and I ordered diagnostic evaluation for abdominal pain and loose stools and flank pain.  I reviewed the chart with her in person showing her that she had eventually followed up 2 months after with Dr. Yong Channel because of some flank pain and loose stools.  She was discovered to be B12 deficient related to Metformin and when her metformin was stopped the loose stool stopped.  Because a CT scan showed some thickening of the left colon the possibility of an infectious colitis was entertained and she was also treated with Cipro and metronidazole.  She did not have any complaints on follow-up call 1 day after her colonoscopy, nor did she mention those problems at her August 2017 visit with Dr. Yong Channel.  More recently she was discovered to have a hemoglobin of 9.8 with an MCV of 74.  Ferritin level was checked and was 5.1.  Iron therapy started though she is not yet picked it up the ferritin was just checked yesterday and she got this appointment that quick.  She has not noted any bleeding she does not donate blood and I believe she does eat iron in her diet.  She has gained some weight with the pandemic and is going to have to start metformin again.  She was recommended  to take a statin and is quite concerned about the potential side effects of that and wants to discuss that with Dr. Yong Channel before she initiates that therapy due to its possible side effects.  She does not have any active GI symptoms other than some occasional left flank and left lower quadrant pain that she has had in the past.  She does have chronic low back issues and has a history of back surgery.  No dysphagia, unintentional weight loss (has gained), appetite  change, early satiety or abdominal pains reported.  And again she has not seen blood in her stool.  She did have an angulated sigmoid colon and I have recommended that I use a pediatric colonoscope as opposed to an adult colonoscope for her next colonoscopy.  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   Allergies  Allergen Reactions  . Guaifenesin & Derivatives Nausea And Vomiting    Hallucinations  . Iohexol Anaphylaxis, Hives and Shortness Of Breath    Incident 1990, premedicated prior to obtaining IV contrast since.  . Amoxicillin-Pot Clavulanate Swelling and Other (See Comments)    Facial swelling  . Other     Contrast dye   Current Meds  Medication Sig  . acetaminophen (TYLENOL) 500 MG tablet Take 500 mg by mouth every 6 (six) hours as needed.  . Alum Hydroxide-Mag Carbonate (GAVISCON PO) Take by mouth.  . diltiazem (CARDIZEM CD) 360 MG 24 hr capsule Take 1 capsule (360 mg total) by mouth daily.  Marland Kitchen levothyroxine (SYNTHROID) 100 MCG tablet Take 1 tablet by mouth once daily  . metFORMIN (GLUCOPHAGE) 500 MG tablet Take 1/2 tablet (250mg  total) by mouth  . metoprolol succinate (TOPROL-XL) 25 MG 24 hr tablet Take 1 tablet (25 mg total) by mouth daily.  Marland Kitchen telmisartan (MICARDIS) 40 MG tablet Take 1 tablet (40 mg total) by mouth daily.  Marland Kitchen triamterene-hydrochlorothiazide (MAXZIDE-25) 37.5-25 MG tablet TAKE 1 TABLET BY MOUTH ONCE DAILY  . vitamin B-12 (CYANOCOBALAMIN) 1000 MCG tablet Take 1,000 mcg by mouth daily.  . Vitamin D, Ergocalciferol, (DRISDOL) 50000 units CAPS capsule TAKE 1 CAPSULE BY MOUTH ONCE A WEEK  . warfarin (COUMADIN) 5 MG tablet TAKE AS DIRECTED BY MOUTH BY ANTICOAGULATION  CLINIC 30 day   Past Medical History:  Diagnosis Date  . Allergy   . Anxiety   . Arthritis   . Asthma   .  Blood transfusion without reported diagnosis   . Cataract   . Clotting disorder ([place])   . Depression   . Diabetes ([place])   . GERD (gastroesophageal reflux disease)   . Hypertension   . Iron deficiency anemia 03/30/2019  . Irregular heart beat   . Lupus anticoagulant with hypercoagulable state ([place])   . Melanoma ([place])    2005  . Primary hypercoagulable state ([place])   . Pulmonary embolism ([place])   . Thyroid disease    hypothyroidism   Past Surgical History:  Procedure Laterality Date  . ABDOMINAL HYSTERECTOMY     noncancerous  . BACK SURGERY  1983  . CHOLECYSTECTOMY    . COLONOSCOPY    . fatty tumor breast     removed  . KNEE ARTHROSCOPY    . melanoma removal     with right neck lymph nodes  . OOPHORECTOMY    . removal lymphnodes in right neck    .  UPPER GASTROINTESTINAL ENDOSCOPY     Social History   Social History Narrative   Married 1971. 1 child Jaycee Mckellips (by [employer]) lives in [place] with 2 grandchildren girls 8,6 in 2015.       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    family history includes Coronary artery disease in her mother; Diabetes in her father, mother, and sister; Hypertension in her father; Mitral valve prolapse in her sister.   Review of Systems LBP, weight gain, increasing blood sugar, fatigue.  All other review of systems appear negative at this time  Objective:   Physical Exam @BP  108/74 (BP Location: Right Arm, Patient Position: Sitting, Cuff Size: Normal)   Pulse 71   Temp 97.6 F (36.4 C) (Other (Comment))   Ht 5' 4.5" (1.638 m)   Wt 209 lb 8 oz (95 kg)   LMP  (LMP Unknown)   SpO2 96%   BMI 35.41 kg/m @  General:  Well-developed, well-nourished and in no acute distress Eyes:  anicteric. ENT:   Mouth and posterior pharynx free of lesions. No telangectasia Neck:   r scar Lungs: Clear to auscultation bilaterally. Heart:  S1S2, no rubs, murmurs, gallops.  Abdomen:  obesesoft, non-tender, no hepatosplenomegaly, hernia, or mass and BS+.  Rectal: deferred Lymph:  no cervical or supraclavicular adenopathy. Extremities:   no edema, cyanosis or clubbing Skin   no rash. Nails w/o telangectasia Neuro:  A&O x 3.  Psych:  appropriate mood and  Affect.   Data Reviewed: Primary care notes, previous colonoscopy notes, labs in the computer, imaging in the computer

## 2019-03-30 NOTE — Assessment & Plan Note (Signed)
Hold warfarin Lovenox bridge Stroke/PE rsik discussed

## 2019-03-30 NOTE — Telephone Encounter (Signed)
[facility] Pre-operative Risk Assessment     Request for surgical clearance:     Endoscopy Procedure  What type of surgery is being performed?     EGD, Colonoscopy  When is this surgery scheduled?     05/05/2019  What type of clearance is required ?   Pharmacy  Are there any medications that need to be held prior to surgery and how long? Warfarin for 5 days, will need a Lovenox bridge  Practice name and name of physician performing surgery?      [facility]  What is your office phone and fax number?      Phone- [phone]  [phone]  Anesthesia type (None, local, MAC, general) ?       MAC

## 2019-03-30 NOTE — Telephone Encounter (Signed)
Yes thanks-may proceed forward with EGD and colonoscopy- I am sending this to [facility] for her expertise in helping set up Lovenox bridge

## 2019-03-30 NOTE — Patient Instructions (Signed)
You have been scheduled for an endoscopy and colonoscopy. Please follow the written instructions given to you at your visit today. Please pick up your prep supplies at the pharmacy within the next 1-3 days. If you use inhalers (even only as needed), please bring them with you on the day of your procedure.   You will be contaced by our office prior to your procedure for directions on holding your Coumadin/Warfarin.  If you do not hear from our office 1 week prior to your scheduled procedure, please call [phone] to discuss.   Normal BMI (Body Mass Index- based on height and weight) is between 23 and 30. Your BMI today is Body mass index is 35.41 kg/m. Marland Kitchen Please consider follow up  regarding your BMI with your Primary Care Provider.   I appreciate the opportunity to care for you. Silvano Rusk, MD, [facility]

## 2019-03-30 NOTE — Assessment & Plan Note (Signed)
lovenox bridge needed I think

## 2019-03-30 NOTE — Assessment & Plan Note (Addendum)
This should be considered blood loss anemia until proven otherwise.  I think it unlikely that there is a significant lesion in the colon given the relatively recent exam but I think it is worth repeating a colonoscopy.  Pediatric colonoscope will be used due to the angulated sigmoid colon.  She certainly could have had some discomfort after that but it sounds like there was a whole lot more going on rather than discomfort, problems after colonoscopy.  Metformin and B12 deficiency would be implicated and I had a good conversation with her about the problems that in soon after her colonoscopy and the timing and the documentation that show she did not come back to me and that the time course goes against it being a colonoscopy complication though I admitted something could have been related.  She seemed satisfied with this.   Upper GI endoscopy is appropriate as well.  Strongly consider small bowel biopsies to exclude celiac disease which is in the differential though I still feel compelled to rule out a blood loss.  Given her multiple thrombosis events and paroxysmal atrial fibrillation I think enoxaparin/Lovenox bridging is appropriate as we did in the past and will ask Dr. Yong Channel and his anticoagulation team for help on that.  This does place her at an increased risk of complications from her procedure so it is higher risk.  I do think these procedures are necessary investigations.  She concurs with this plan.

## 2019-04-03 NOTE — Telephone Encounter (Signed)
Patient informed and will be on the look out to be contacted about the bridging.

## 2019-04-03 NOTE — Telephone Encounter (Signed)
Noted  

## 2019-04-10 ENCOUNTER — Other Ambulatory Visit: Payer: Self-pay | Admitting: General Practice

## 2019-04-10 DIAGNOSIS — Z7901 Long term (current) use of anticoagulants: Secondary | ICD-10-CM

## 2019-04-10 MED ORDER — ENOXAPARIN SODIUM 150 MG/ML ~~LOC~~ SOLN: 150.0000 mg | SUBCUTANEOUS | 0 refills | Status: DC

## 2019-04-10 NOTE — Patient Instructions (Addendum)
Pre visit review using our clinic review tool, if applicable. No additional management support is needed unless otherwise documented below in the visit note.  Continue to take 1 tablet daily.   Re-check on 8/19.  Please follow patient instructions.  Instruction for Lovenox bridge  8/9 - Last dose of coumadin until after procedure 8/10 - Nothing (No coumadin and No Lovenox) 8/11 - Lovenox X 1 in the AM 8/12 - Lovenox X 1 in the AM 8/13 - Lovenox X 1 in the AM ( Take by 7 am) 8/14 - Procedure (No Lovenox today) 8/15 - Lovenox X 1 in the AM AND 1 1/2 tablets of coumadin 8/16 - Lovenox X 1 in the AM AND 1 1/2 tablets of coumadin 8/17 - Lovenox X 1 in the AM AND 1 1/2 tablets of coumadin 8/18 - Lovenox X 1 in the AM AND 1 tablet of coumadin 8/19 - Re-check INR

## 2019-04-12 ENCOUNTER — Ambulatory Visit (INDEPENDENT_AMBULATORY_CARE_PROVIDER_SITE_OTHER): Payer: Medicare Other | Admitting: General Practice

## 2019-04-12 ENCOUNTER — Other Ambulatory Visit: Payer: Self-pay

## 2019-04-12 DIAGNOSIS — Z7901 Long term (current) use of anticoagulants: Secondary | ICD-10-CM | POA: Diagnosis not present

## 2019-04-12 DIAGNOSIS — D6859 Other primary thrombophilia: Secondary | ICD-10-CM

## 2019-04-12 LAB — POCT INR: INR: 2.8 (ref 2.0–3.0)

## 2019-04-13 ENCOUNTER — Telehealth: Payer: Self-pay | Admitting: Internal Medicine

## 2019-04-13 NOTE — Telephone Encounter (Signed)
Pt returning you call. Please call back

## 2019-04-13 NOTE — Telephone Encounter (Signed)
Left message for her to call me back. 

## 2019-04-13 NOTE — Telephone Encounter (Signed)
Diet questions answered.

## 2019-04-13 NOTE — Telephone Encounter (Signed)
Patient has questions about her prep

## 2019-04-24 ENCOUNTER — Encounter: Payer: Self-pay | Admitting: Internal Medicine

## 2019-05-01 ENCOUNTER — Other Ambulatory Visit: Payer: Self-pay | Admitting: Family Medicine

## 2019-05-02 NOTE — Telephone Encounter (Signed)
May refill Coumadin- for vitamin D have her take 1000 2000 units over-the-counter until her visit- have her remind me and I will check a vitamin D at that time

## 2019-05-02 NOTE — Telephone Encounter (Signed)
Last OV 03/09/19 Vit D last checked 12/23/2016  Forwarding to Dr. Yong Channel

## 2019-05-03 ENCOUNTER — Telehealth: Payer: Self-pay | Admitting: Family Medicine

## 2019-05-03 NOTE — Telephone Encounter (Signed)
I left a message asking the patient to call me at [phone] to schedule AWV with Courtney. VDM (Dee-Dee) °

## 2019-05-04 ENCOUNTER — Telehealth: Payer: Self-pay | Admitting: Internal Medicine

## 2019-05-04 NOTE — Telephone Encounter (Signed)
Covid-19 Screening Questions     Do you now or have you had a fever in the last 14 days?   NO     Do you have any respiratory symptoms of shortness of breath or cough now or in the last 14 days?    NO   Do you have any family members or close contacts with diagnosed or suspected Covid-19 in the past 14 days?     NO   Have you been tested for Covid-19 and found to be positive?       Pt made aware of that care partner may wait in the car or come up to the lobby during the procedure but will need to provide their own mask.

## 2019-05-05 ENCOUNTER — Ambulatory Visit (AMBULATORY_SURGERY_CENTER): Payer: Medicare Other | Admitting: Internal Medicine

## 2019-05-05 ENCOUNTER — Other Ambulatory Visit: Payer: Self-pay

## 2019-05-05 ENCOUNTER — Encounter: Payer: Self-pay | Admitting: Internal Medicine

## 2019-05-05 VITALS — BP 135/84 | HR 70 | Temp 98.4°F | Resp 16

## 2019-05-05 DIAGNOSIS — D123 Benign neoplasm of transverse colon: Secondary | ICD-10-CM | POA: Diagnosis not present

## 2019-05-05 DIAGNOSIS — K317 Polyp of stomach and duodenum: Secondary | ICD-10-CM | POA: Diagnosis not present

## 2019-05-05 DIAGNOSIS — D122 Benign neoplasm of ascending colon: Secondary | ICD-10-CM

## 2019-05-05 DIAGNOSIS — D509 Iron deficiency anemia, unspecified: Secondary | ICD-10-CM

## 2019-05-05 DIAGNOSIS — Z86010 Personal history of colonic polyps: Secondary | ICD-10-CM

## 2019-05-05 DIAGNOSIS — Z1211 Encounter for screening for malignant neoplasm of colon: Secondary | ICD-10-CM | POA: Diagnosis not present

## 2019-05-05 DIAGNOSIS — C7A8 Other malignant neuroendocrine tumors: Secondary | ICD-10-CM

## 2019-05-05 DIAGNOSIS — D131 Benign neoplasm of stomach: Secondary | ICD-10-CM | POA: Diagnosis not present

## 2019-05-05 DIAGNOSIS — D3A8 Other benign neuroendocrine tumors: Secondary | ICD-10-CM | POA: Diagnosis not present

## 2019-05-05 MED ORDER — SODIUM CHLORIDE 0.9 % IV SOLN: 500.0000 mL | Freq: Once | INTRAVENOUS | Status: DC

## 2019-05-05 MED ORDER — DICYCLOMINE HCL 20 MG PO TABS: 20.0000 mg | ORAL_TABLET | Freq: Four times a day (QID) | ORAL | 0 refills | Status: DC | PRN

## 2019-05-05 NOTE — Progress Notes (Signed)
A and O x3. Report to RN. Tolerated MAC anesthesia well.Teeth unchanged after procedure.

## 2019-05-05 NOTE — Op Note (Signed)
[facility] Patient Name: Tynesha Free Procedure Date: 05/05/2019 9:17 AM MRN: [id number] Endoscopist: Gatha Mayer , MD Age: 63 Referring MD:  Date of Birth: 1956/02/14 Gender: Female Account #: [id number] Procedure:                Colonoscopy Indications:              Unexplained iron deficiency anemia Medicines:                Propofol per Anesthesia, Monitored Anesthesia Care Procedure:                Pre-Anesthesia Assessment:                           - Prior to the procedure, a History and Physical                            was performed, and patient medications and                            allergies were reviewed. The patient's tolerance of                            previous anesthesia was also reviewed. The risks                            and benefits of the procedure and the sedation                            options and risks were discussed with the patient.                            All questions were answered, and informed consent                            was obtained. Prior Anticoagulants: The patient                            last took Coumadin (warfarin) 5 days and Lovenox                            (enoxaparin) 1 day prior to the procedure. ASA                            Grade Assessment: III - A patient with severe                            systemic disease. After reviewing the risks and                            benefits, the patient was deemed in satisfactory                            condition to undergo the procedure.  After obtaining informed consent, the colonoscope                            was passed under direct vision. Throughout the                            procedure, the patient's blood pressure, pulse, and                            oxygen saturations were monitored continuously. The                            Colonoscope was introduced through the anus and                            advanced to the  the cecum, identified by                            appendiceal orifice and ileocecal valve. The                            colonoscopy was performed without difficulty. The                            quality of the bowel preparation was adequate. The                            ileocecal valve, appendiceal orifice, and rectum                            were photographed. The bowel preparation used was                            Miralax via split dose instruction. The patient                            tolerated the procedure fairly well. Scope In: 9:33:36 AM Scope Out: 9:48:42 AM Scope Withdrawal Time: 0 hours 11 minutes 42 seconds  Total Procedure Duration: 0 hours 15 minutes 6 seconds  Findings:                 The perianal and digital rectal examinations were                            normal.                           Two sessile polyps were found in the transverse                            colon and ascending colon. The polyps were                            diminutive in size. These polyps were removed with  a cold snare. Resection and retrieval were                            complete. Verification of patient identification                            for the specimen was done. Estimated blood loss was                            minimal.                           The exam was otherwise without abnormality on                            direct and retroflexion views. Complications:            No immediate complications. Estimated Blood Loss:     Estimated blood loss was minimal. Impression:               - Two diminutive polyps in the transverse colon and                            in the ascending colon, removed with a cold snare.                            Resected and retrieved.                           - The examination was otherwise normal on direct                            and retroflexion views.                           - Personal history of  colonic polyps. 9 mm adenoma                            2017 Recommendation:           - Patient has a contact number available for                            emergencies. The signs and symptoms of potential                            delayed complications were discussed with the                            patient. Return to normal activities tomorrow.                            Written discharge instructions were provided to the                            patient.                           -  Resume previous diet.                           - Continue present medications.                           - Resume Coumadin (warfarin) tomorrow and Lovenox                            (enoxaparin) tomorrow at prior doses. Refer to                            Coumadin Clinic for further adjustment of therapy.                           - Repeat colonoscopy is recommended for                            surveillance. The colonoscopy date will be                            determined after pathology results from today's                            exam become available for review.                           DICYCLOMIINE PRN ABD PAIN - SHE HAD SOME PROBLEMS                            POST COLON IN 2017 AND HAS A SENSITIVE SIGMOPID                            COLON - SOME DISCOMFORT WITH SCOPE PASSAGE Gatha Mayer, MD 05/05/2019 10:08:14 AM This report has been signed electronically.

## 2019-05-05 NOTE — Patient Instructions (Addendum)
I found multiple stomach polyps and believe that is where you are leaking blood. I took biopsies to know what types of polyps you have. One polyp was large and that led me ito only take biopsies. These polyps need removal but think it will take special techniques at the hospital. I will explain more after pathology results return.  I also found 2 tiny benign colon polyps.  I will call with results and plans - probably next week (later in week).  IF YOU HAVE ABDOMINAL CRAMPS TRY DICYCLOMINE - I PRESCRIBED IT  Here are your anti-coagulant instructions you have.   8/15 - Lovenox X 1 in the AM AND 1 1/2 tablets of coumadin 8/16 - Lovenox X 1 in the AM AND 1 1/2 tablets of coumadin 8/17 - Lovenox X 1 in the AM AND 1 1/2 tablets of coumadin 8/18 - Lovenox X 1 in the AM AND 1 tablet of coumadin 8/19 - Re-check INR  I appreciate the opportunity to care for you. Gatha Mayer, MD, [facility]  Polyp handout given to patient.  Resume previous diet. Continue present medications.  Repeat colonoscopy recommended for surveillance.  Date to be determined after pathology results reviewed.  Dicyclomine as needed for abdominal pain.  YOU HAD AN ENDOSCOPIC PROCEDURE TODAY AT [facility]:   Refer to the procedure report that was given to you for any specific questions about what was found during the examination.  If the procedure report does not answer your questions, please call your gastroenterologist to clarify.  If you requested that your care partner not be given the details of your procedure findings, then the procedure report has been included in a sealed envelope for you to review at your convenience later.  YOU SHOULD EXPECT: Some feelings of bloating in the abdomen. Passage of more gas than usual.  Walking can help get rid of the air that was put into your GI tract during the procedure and reduce the bloating. If you had a lower endoscopy (such as a colonoscopy or flexible  sigmoidoscopy) you may notice spotting of blood in your stool or on the toilet paper. If you underwent a bowel prep for your procedure, you may not have a normal bowel movement for a few days.  Please Note:  You might notice some irritation and congestion in your nose or some drainage.  This is from the oxygen used during your procedure.  There is no need for concern and it should clear up in a day or so.  SYMPTOMS TO REPORT IMMEDIATELY:   Following lower endoscopy (colonoscopy or flexible sigmoidoscopy):  Excessive amounts of blood in the stool  Significant tenderness or worsening of abdominal pains  Swelling of the abdomen that is new, acute  Fever of 100F or higher   Following upper endoscopy (EGD)  Vomiting of blood or coffee ground material  New chest pain or pain under the shoulder blades  Painful or persistently difficult swallowing  New shortness of breath  Fever of 100F or higher  Black, tarry-looking stools  For urgent or emergent issues, a gastroenterologist can be reached at any hour by calling [phone].   DIET:  We do recommend a small meal at first, but then you may proceed to your regular diet.  Drink plenty of fluids but you should avoid alcoholic beverages for 24 hours.  ACTIVITY:  You should plan to take it easy for the rest of today and you should NOT DRIVE or use  heavy machinery until tomorrow (because of the sedation medicines used during the test).    FOLLOW UP: Our staff will call the number listed on your records 48-72 hours following your procedure to check on you and address any questions or concerns that you may have regarding the information given to you following your procedure. If we do not reach you, we will leave a message.  We will attempt to reach you two times.  During this call, we will ask if you have developed any symptoms of COVID 19. If you develop any symptoms (ie: fever, flu-like symptoms, shortness of breath, cough etc.) before then,  please call [phone].  If you test positive for Covid 19 in the 2 weeks post procedure, please call and report this information to [place].    If any biopsies were taken you will be contacted by phone or by letter within the next 1-3 weeks.  Please call us at [phone] if you have not heard about the biopsies in 3 weeks.    SIGNATURES/CONFIDENTIALITY: You and/or your care partner have signed paperwork which will be entered into your electronic medical record.  These signatures attest to the fact that that the information above on your After Visit Summary has been reviewed and is understood.  Full responsibility of the confidentiality of this discharge information lies with you and/or your care-partner.

## 2019-05-05 NOTE — Progress Notes (Signed)
Pt's states no medical or surgical changes since previsit or office visit. 

## 2019-05-05 NOTE — Op Note (Addendum)
[facility] Patient Name: Erin Kirk Procedure Date: 05/05/2019 9:17 AM MRN: [id number] Endoscopist: Gatha Mayer , MD Age: 63 Referring MD:  Date of Birth: Jul 04, 1956 Gender: Female Account #: [id number] Procedure:                Upper GI endoscopy Indications:              Suspected upper gastrointestinal bleeding in                            patient with unexplained iron deficiency anemia Medicines:                Propofol per Anesthesia, Monitored Anesthesia Care Procedure:                Pre-Anesthesia Assessment:                           - Prior to the procedure, a History and Physical                            was performed, and patient medications and                            allergies were reviewed. The patient's tolerance of                            previous anesthesia was also reviewed. The risks                            and benefits of the procedure and the sedation                            options and risks were discussed with the patient.                            All questions were answered, and informed consent                            was obtained. Prior Anticoagulants: The patient                            last took Coumadin (warfarin) 5 days and Lovenox                            (enoxaparin) 1 day prior to the procedure. ASA                            Grade Assessment: III - A patient with severe                            systemic disease. After reviewing the risks and                            benefits, the patient was deemed in satisfactory  condition to undergo the procedure.                           After obtaining informed consent, the endoscope was                            passed under direct vision. Throughout the                            procedure, the patient's blood pressure, pulse, and                            oxygen saturations were monitored continuously. The   Endoscope was introduced through the mouth, and                            advanced to the second part of duodenum. The upper                            GI endoscopy was somewhat difficult due to patient                            coughing. Successful completion of the procedure                            was aided by suctioning and additional sedation.                            The patient tolerated the procedure fairly well. Scope In: Scope Out: Findings:                 Multiple 5 to 12 mm sessile polyps with bleeding                            and no stigmata of recent bleeding were found in                            the cardia and in the gastric body. Biopsies were                            taken with a cold forceps for histology.                            Verification of patient identification for the                            specimen was done. Estimated blood loss was minimal.                           The exam was otherwise without abnormality.                           The cardia and gastric fundus were normal on  retroflexion. Complications:            No immediate complications. Estimated Blood Loss:     Estimated blood loss was minimal. Impression:               - Multiple gastric polyps. Biopsied. Largest looks                            adenomatous and at distal cardia proximal body -                            broad base 12+ mm. Very friable and oozy so I think                            this and the other smaller polyps are likely source                            of iron-deficiency anemia.                           - The examination was otherwise normal. Recommendation:           - Patient has a contact number available for                            emergencies. The signs and symptoms of potential                            delayed complications were discussed with the                            patient. Return to normal activities  tomorrow.                            Written discharge instructions were provided to the                            patient.                           - Resume previous diet.                           - Continue present medications.                           - See the other procedure note for documentation of                            additional recommendations.                           - Resume Coumadin (warfarin) at prior dose                            tomorrow. Refer to Coumadin Clinic for further  adjustment of therapy.                           - Resume Lovenox (enoxaparin) at prior dose                            tomorrow. Refer to Coumadin Clinic for further                            adjustment of therapy.                           - Await pathology results. SHE WILL NEED REMOVAL OF                            THE POLYPS AND MAY NEED SPECIAL TECHNIQUES (FULL                            THICKNESS RESECTIONM, EUS ASSISTANCE) - WILL                            DISCUSS W/ HER. DO NOT THINK MALIGNANT BUT SOME                            LOOK POTENTIALLY ADENOMATOUS - WOULD CONSIDER                            INTUBATIONG AND GENERAL ANESTHESIA GIVEN SECRETIONS                            AND COUGHING WITH THIS PROCEDURE Gatha Mayer, MD 05/05/2019 10:01:17 AM This report has been signed electronically.

## 2019-05-05 NOTE — Progress Notes (Signed)
Called to room to assist during endoscopic procedure.  Patient ID and intended procedure confirmed with present staff. Received instructions for my participation in the procedure from the performing physician.  

## 2019-05-09 ENCOUNTER — Telehealth: Payer: Self-pay

## 2019-05-09 NOTE — Telephone Encounter (Signed)
  Follow up Call-  Call back number 05/05/2019  Post procedure Call Back phone  # [phone]  Permission to leave phone message Yes  Some recent data might be hidden     Patient questions:  Do you have a fever, pain , or abdominal swelling? No. Pain Score  0 *  Have you tolerated food without any problems? Yes.    Have you been able to return to your normal activities? Yes.    Do you have any questions about your discharge instructions: Diet   No. Medications  No. Follow up visit  No.  Do you have questions or concerns about your Care? No.  Actions: * If pain score is 4 or above: No action needed, pain <4.  1. Have you developed a fever since your procedure? no  2.   Have you had an respiratory symptoms (SOB or cough) since your procedure? no  3.   Have you tested positive for COVID 19 since your procedure no  4.   Have you had any family members/close contacts diagnosed with the COVID 19 since your procedure?  no   If yes to any of these questions please route to Joylene John, RN and Alphonsa Gin, Therapist, sports.

## 2019-05-10 ENCOUNTER — Other Ambulatory Visit: Payer: Self-pay

## 2019-05-10 ENCOUNTER — Ambulatory Visit (INDEPENDENT_AMBULATORY_CARE_PROVIDER_SITE_OTHER): Payer: Medicare Other | Admitting: General Practice

## 2019-05-10 DIAGNOSIS — Z7901 Long term (current) use of anticoagulants: Secondary | ICD-10-CM

## 2019-05-10 DIAGNOSIS — D6859 Other primary thrombophilia: Secondary | ICD-10-CM

## 2019-05-10 LAB — POCT INR: INR: 2 (ref 2.0–3.0)

## 2019-05-10 NOTE — Progress Notes (Signed)
I have reviewed and agree with note, evaluation, plan.   Stephen Hunter, MD  

## 2019-05-10 NOTE — Patient Instructions (Addendum)
Pre visit review using our clinic review tool, if applicable. No additional management support is needed unless otherwise documented below in the visit note.  Take 1 1/2 tablets today and tomorrow and then continue to take 1 tablet daily.   Re-check in 4 weeks.

## 2019-05-11 NOTE — Progress Notes (Signed)
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

## 2019-05-12 ENCOUNTER — Encounter: Payer: Self-pay | Admitting: Internal Medicine

## 2019-05-12 ENCOUNTER — Other Ambulatory Visit: Payer: Self-pay

## 2019-05-12 DIAGNOSIS — C7A8 Other malignant neuroendocrine tumors: Secondary | ICD-10-CM

## 2019-05-12 DIAGNOSIS — D509 Iron deficiency anemia, unspecified: Secondary | ICD-10-CM

## 2019-05-12 HISTORY — DX: Other malignant neuroendocrine tumors: C7A.8

## 2019-05-12 MED ORDER — PREDNISONE 10 MG PO TABS: ORAL_TABLET | ORAL | 0 refills | Status: DC

## 2019-05-12 NOTE — Progress Notes (Signed)
She needs the prednisone protocol and done at hospital thanks

## 2019-05-12 NOTE — Progress Notes (Signed)
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

## 2019-05-15 ENCOUNTER — Other Ambulatory Visit (INDEPENDENT_AMBULATORY_CARE_PROVIDER_SITE_OTHER): Payer: Medicare Other

## 2019-05-15 DIAGNOSIS — C7A8 Other malignant neuroendocrine tumors: Secondary | ICD-10-CM

## 2019-05-15 DIAGNOSIS — D509 Iron deficiency anemia, unspecified: Secondary | ICD-10-CM

## 2019-05-15 LAB — CBC WITH DIFFERENTIAL/PLATELET
Basophils Absolute: 0.1 10*3/uL (ref 0.0–0.1)
Basophils Relative: 1.4 % (ref 0.0–3.0)
Eosinophils Absolute: 0.4 10*3/uL (ref 0.0–0.7)
Eosinophils Relative: 5.5 % — ABNORMAL HIGH (ref 0.0–5.0)
HCT: 36 % (ref 36.0–46.0)
Hemoglobin: 11.6 g/dL — ABNORMAL LOW (ref 12.0–15.0)
Lymphocytes Relative: 24.9 % (ref 12.0–46.0)
Lymphs Abs: 1.8 10*3/uL (ref 0.7–4.0)
MCHC: 32.3 g/dL (ref 30.0–36.0)
MCV: 77.2 fl — ABNORMAL LOW (ref 78.0–100.0)
Monocytes Absolute: 0.5 10*3/uL (ref 0.1–1.0)
Monocytes Relative: 7.3 % (ref 3.0–12.0)
Neutro Abs: 4.3 10*3/uL (ref 1.4–7.7)
Neutrophils Relative %: 60.9 % (ref 43.0–77.0)
Platelets: 233 10*3/uL (ref 150.0–400.0)
RBC: 4.67 Mil/uL (ref 3.87–5.11)
RDW: 18.4 % — ABNORMAL HIGH (ref 11.5–15.5)
WBC: 7.1 10*3/uL (ref 4.0–10.5)

## 2019-05-15 LAB — COMPREHENSIVE METABOLIC PANEL
ALT: 51 U/L — ABNORMAL HIGH (ref 0–35)
AST: 17 U/L (ref 0–37)
Albumin: 4.7 g/dL (ref 3.5–5.2)
Alkaline Phosphatase: 64 U/L (ref 39–117)
BUN: 15 mg/dL (ref 6–23)
CO2: 28 mEq/L (ref 19–32)
Calcium: 9.1 mg/dL (ref 8.4–10.5)
Chloride: 102 mEq/L (ref 96–112)
Creatinine, Ser: 1.16 mg/dL (ref 0.40–1.20)
GFR: 46.85 mL/min — ABNORMAL LOW (ref >60.00)
Glucose, Bld: 135 mg/dL — ABNORMAL HIGH (ref 70–99)
Potassium: 4 mEq/L (ref 3.5–5.1)
Sodium: 139 mEq/L (ref 135–145)
Total Bilirubin: 0.5 mg/dL (ref 0.2–1.2)
Total Protein: 7.2 g/dL (ref 6.0–8.3)

## 2019-05-16 LAB — FERRITIN: Ferritin: 6.3 ng/mL — ABNORMAL LOW (ref 10.0–291.0)

## 2019-05-17 ENCOUNTER — Other Ambulatory Visit: Payer: Self-pay

## 2019-05-17 MED ORDER — IRON 325 (65 FE) MG PO TABS: 325.0000 mg | ORAL_TABLET | Freq: Every day | ORAL | 0 refills | Status: DC

## 2019-05-17 NOTE — Progress Notes (Signed)
Let her know Hgb up to 11.6 from 9.8  I think she is on iron supplement but do not see on her lis - please ensure she is taking one  Thanks  More to come after CT done

## 2019-05-18 ENCOUNTER — Other Ambulatory Visit: Payer: Self-pay

## 2019-05-18 ENCOUNTER — Ambulatory Visit (HOSPITAL_COMMUNITY)
Admission: RE | Admit: 2019-05-18 | Discharge: 2019-05-18 | Disposition: A | Discharge status: Home / Self Care | Payer: Medicare Other | Source: Ambulatory Visit | Attending: Internal Medicine | Admitting: Internal Medicine

## 2019-05-18 DIAGNOSIS — D509 Iron deficiency anemia, unspecified: Secondary | ICD-10-CM | POA: Diagnosis not present

## 2019-05-18 DIAGNOSIS — C7A8 Other malignant neuroendocrine tumors: Secondary | ICD-10-CM | POA: Diagnosis not present

## 2019-05-18 DIAGNOSIS — R140 Abdominal distension (gaseous): Secondary | ICD-10-CM | POA: Diagnosis not present

## 2019-05-18 DIAGNOSIS — C7A092 Malignant carcinoid tumor of the stomach: Secondary | ICD-10-CM | POA: Diagnosis not present

## 2019-05-18 MED ORDER — SODIUM CHLORIDE (PF) 0.9 % IJ SOLN
INTRAMUSCULAR | Status: AC
  Filled 2019-05-18: qty 50

## 2019-05-18 MED ORDER — IOHEXOL 300 MG/ML  SOLN
100.0000 mL | Freq: Once | INTRAMUSCULAR | Status: AC | PRN
  Administered 2019-05-18: 12:00:00 100 mL via INTRAVENOUS

## 2019-05-18 NOTE — Progress Notes (Signed)
CT scan is ok  Please let her know  I/we will be reviewing her case at GI tumor conference 9/2 and I anticipate discussing what we think about next steps after that  If I don't call her by 9/4 she should call

## 2019-05-19 LAB — CHROMOGRANIN A: Chromogranin A: 2013 ng/mL — ABNORMAL HIGH (ref 25–140)

## 2019-05-26 ENCOUNTER — Telehealth: Payer: Self-pay | Admitting: Internal Medicine

## 2019-05-26 DIAGNOSIS — C7A8 Other malignant neuroendocrine tumors: Secondary | ICD-10-CM

## 2019-05-26 NOTE — Telephone Encounter (Signed)
Dr. Carlean Purl did you speak with the tumor board about patient?

## 2019-05-26 NOTE — Telephone Encounter (Signed)
I spoke to the patient about our plans for EUS guided removal of neuroendocrine tumor/polyps.  Her husband is admitted to the hospital with recurrent lung cancer and is fairly ill right now though she wants to proceed we agreed that we would call her next week about an appointment as she is at the hospital right now.   She will need anticoagulation management probably similar to what we did for her EGD recently.  This is managed by primary care.   I think general anesthesia might be best for her based upon how she tolerated the deep sedation procedure just with the biopsies.  I will leave it up to you Gabe if you think you need to see her in the office as well I told her that might happen or might not.   We should also refer her to Dr. Benay Spice or Dr. Burr Medico regarding neuroendocrine tumors of the stomach know that can wait until she has her procedure with Dr. Rush Landmark i.e. does not have to be soon, would wait until the polyps are removed but perhaps schedule for 2 weeks after the date of the procedure  Thank you all for your help

## 2019-05-26 NOTE — Telephone Encounter (Signed)
Glendell Docker and [place], I look forward to having opportunity to try and help the patient as best we can. Please let Patty know as well as to final decision after discussion with patient. GM

## 2019-05-26 NOTE — Telephone Encounter (Signed)
If the patient is okay with meeting in the clinic to discuss things further I would be happy to go over my plan and objective of the EUS/EGD with EMR of multiple lesions.  Since I suspect I will need to remove multiple areas I think it would be ideal because her risk of bleeding will be higher knowing her background history of anticoagulation needs. [place] or [place], please reach out to patient next week as per Dr. Celesta Aver recommendation and set up a clinic visit. Patty, I would start looking for a spot for a 2-hour EGD/EUS with EMR. Thank you. GM

## 2019-05-26 NOTE — Telephone Encounter (Signed)
Yes - we are going to set her up with Dr. Rush Landmark to remove the polyps  I will call her and explain

## 2019-05-30 ENCOUNTER — Other Ambulatory Visit: Payer: Self-pay

## 2019-05-30 DIAGNOSIS — C7A8 Other malignant neuroendocrine tumors: Secondary | ICD-10-CM

## 2019-05-30 DIAGNOSIS — K317 Polyp of stomach and duodenum: Secondary | ICD-10-CM

## 2019-05-30 NOTE — Telephone Encounter (Signed)
Patient notified of the recommendaitons She will come and see Dr. Rush Landmark on 06/30/19 11:30, she is tentatively scheduled for EGD on 07/12/19.  She is notified that we will provide her instructions at the appt time.  I will make referral for Dr. Benay Spice to plan on being seen in November.  She is aware she will be contacted directly with an appt date and time.

## 2019-05-30 NOTE — Telephone Encounter (Signed)
Procedure scheduled for 07/12/19 at [facility] at 10 am with Dr Rush Landmark.  COVID test on 10/17 Saturday.  Instructions to be given at her appt.

## 2019-06-02 ENCOUNTER — Telehealth: Payer: Self-pay | Admitting: Oncology

## 2019-06-02 NOTE — Telephone Encounter (Signed)
Received a new patient referral from Dr. 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 Pt has been cld and scheduled to see Dr. Benay Spice on 9/15 at 2pm. Aware to arrive 20 minutes early.

## 2019-06-05 ENCOUNTER — Telehealth: Payer: Self-pay | Admitting: Family Medicine

## 2019-06-05 ENCOUNTER — Telehealth: Payer: Self-pay | Admitting: Oncology

## 2019-06-05 NOTE — Telephone Encounter (Signed)
I left a message asking the patient to call me at [phone] to schedule AWV with Loma Sousa on 9/29.  Im waiting for a call back to either confirm or decline the appointment. VDM (Dee-Dee)

## 2019-06-05 NOTE — Telephone Encounter (Signed)
Called patient regarding pre-screening questions for appointments on 09/15. Left a voicemail.

## 2019-06-06 ENCOUNTER — Other Ambulatory Visit: Payer: Self-pay | Admitting: Family Medicine

## 2019-06-06 ENCOUNTER — Inpatient Hospital Stay: Payer: Medicare Other | Attending: Oncology | Admitting: Oncology

## 2019-06-06 ENCOUNTER — Telehealth: Payer: Self-pay | Admitting: Family Medicine

## 2019-06-06 ENCOUNTER — Telehealth: Payer: Self-pay | Admitting: *Deleted

## 2019-06-06 NOTE — Telephone Encounter (Signed)
Called patient to follow up on Erin Kirk today. She reports she is not able to come in today--her husband is on the way to the hospital for cardiac cath and then admit to ICU.

## 2019-06-06 NOTE — Telephone Encounter (Signed)
The patient called and left me a message asking to cancel both the AWV appointment on 06/20/2019 as well as the appointment with Dr. Yong Channel.  She said that there is a lot going on right now with both her and her husband dealing with cancer.  I called her back and left a message confirming that both appointments on 9/29 have been cancelled. VDM (DD)

## 2019-06-07 ENCOUNTER — Other Ambulatory Visit: Payer: Self-pay

## 2019-06-07 ENCOUNTER — Ambulatory Visit (INDEPENDENT_AMBULATORY_CARE_PROVIDER_SITE_OTHER): Payer: Medicare Other | Admitting: General Practice

## 2019-06-07 DIAGNOSIS — Z7901 Long term (current) use of anticoagulants: Secondary | ICD-10-CM | POA: Diagnosis not present

## 2019-06-07 DIAGNOSIS — D6859 Other primary thrombophilia: Secondary | ICD-10-CM

## 2019-06-07 LAB — POCT INR: INR: 4.7 — AB (ref 2.0–3.0)

## 2019-06-07 NOTE — Patient Instructions (Signed)
Pre visit review using our clinic review tool, if applicable. No additional management support is needed unless otherwise documented below in the visit note.  Hold coumadin today (9/16) and then change dosage and take 1 tablet daily except 1/2 tablet on Wednesdays.  Re-check on 10/7.  Patient will need instructions for Lovenox bridge for procedure on 10/21.

## 2019-06-07 NOTE — Progress Notes (Signed)
I have reviewed and agree with note, evaluation, plan.   Aerion Bagdasarian, MD  

## 2019-06-12 ENCOUNTER — Telehealth: Payer: Self-pay | Admitting: Gastroenterology

## 2019-06-12 ENCOUNTER — Other Ambulatory Visit: Payer: Self-pay | Admitting: Family Medicine

## 2019-06-12 NOTE — Telephone Encounter (Signed)
The pt was advised that the appt is on 10/17 Saturday between 9/12. The pt has been advised of the information and verbalized understanding.

## 2019-06-20 ENCOUNTER — Ambulatory Visit: Payer: Medicare Other

## 2019-06-20 ENCOUNTER — Ambulatory Visit: Payer: 59 | Admitting: Family Medicine

## 2019-06-23 ENCOUNTER — Telehealth: Payer: Self-pay | Admitting: Oncology

## 2019-06-23 ENCOUNTER — Telehealth: Payer: Self-pay | Admitting: *Deleted

## 2019-06-23 ENCOUNTER — Other Ambulatory Visit: Payer: Self-pay

## 2019-06-23 NOTE — Telephone Encounter (Signed)
MS. Osinski cld to reschedule her appt from 10/23 to 10/13 to see Dr. Benay Spice at 2pm. [facility] been made aware to arrive 20 minutes early.

## 2019-06-23 NOTE — Patient Outreach (Signed)
[facility]) Care Management  06/23/2019  Erin Kirk November 28, 1955 [id number]   Medication Adherence call to Mrs. [place] Compliant Voice message left with a call back number. [name] is showing past due on Lovastatin 10 mg and Metformin 500 mg under [place].   [place] Management Direct Dial [phone]  Fax [phone] [email or web address]

## 2019-06-23 NOTE — Telephone Encounter (Signed)
Patient reports her EUS is on 10/21 and asking if she still needs to see Dr. Benay Spice on 10/13? Informed her that it would be best to see her after the EUS. Rescheduled for 07/24/19 at 2 pm.

## 2019-06-28 ENCOUNTER — Ambulatory Visit (INDEPENDENT_AMBULATORY_CARE_PROVIDER_SITE_OTHER): Payer: Medicare Other | Admitting: General Practice

## 2019-06-28 ENCOUNTER — Other Ambulatory Visit: Payer: Self-pay | Admitting: General Practice

## 2019-06-28 ENCOUNTER — Other Ambulatory Visit: Payer: Self-pay

## 2019-06-28 ENCOUNTER — Telehealth: Payer: Self-pay | Admitting: General Practice

## 2019-06-28 DIAGNOSIS — Z7901 Long term (current) use of anticoagulants: Secondary | ICD-10-CM

## 2019-06-28 LAB — POCT INR: INR: 4.5 — AB (ref 2.0–3.0)

## 2019-06-28 MED ORDER — ENOXAPARIN SODIUM 100 MG/ML ~~LOC~~ SOLN: 100.0000 mg | Freq: Two times a day (BID) | SUBCUTANEOUS | 0 refills | Status: DC

## 2019-06-28 NOTE — Progress Notes (Signed)
I have reviewed and agree with note, evaluation, plan.   Stephen Hunter, MD  

## 2019-06-28 NOTE — Telephone Encounter (Signed)
-----   Message from Marin Olp, MD sent at 06/28/2019  1:33 PM EDT ----- Regarding: RE: Lovenox bridge Yes please.Unfortunately I do know about her husband ----- Message ----- From: Warden Fillers, RN Sent: 06/28/2019  10:24 AM EDT To: Marin Olp, MD Subject: Lovenox bridge                                 Dr. Yong Channel  Patient will be having surgery on 07/12/19.  She will need to hold coumadin for 5 days prior and will need to be bridged with Lovenox.  Do I have your permission to dose and send to pharmacy?  Please advise.  Thanks, Villa Herb, RN  FYI - You may already know but patient lost her husband last week.

## 2019-06-28 NOTE — Patient Instructions (Signed)
Pre visit review using our clinic review tool, if applicable. No additional management support is needed unless otherwise documented below in the visit note.  Hold coumadin today and tomorrow (10/7 and 10/8) and then change dosage and take 1 tablet daily except 1/2 tablet on Wednesdays and Fridays.  Re-check on 10/28.  Please follow patient instructions.  10/16 - Last dose of coumadin until after procedure 10/17 - Nothing (No coumadin or Lovenox) 10/18 - Lovenox in the AM and PM (12 hours apart) 10/19 - Lovenox in the AM and PM 10/20 - Lovenox in the AM only! 10/21 - NO LOVENOX TODAY 10/22 - Lovenox in the AM and PM and 1 1/2 tablets of coumadin 10/23 - Lovenox in the AM and PM and 1 1/2 tablets of coumadin 10/24 - Lovenox in the AM and PM and 1 1/2 tablets of coumadin 10/25 - Lovenox in the AM and PM and 1 tablet of coumadin 10/26 - Stop Lovenox and continue to take previous dosage of coumadin 10/28 - Check INR with Jenny Reichmann

## 2019-06-29 ENCOUNTER — Other Ambulatory Visit: Payer: Self-pay | Admitting: General Practice

## 2019-06-29 DIAGNOSIS — Z7901 Long term (current) use of anticoagulants: Secondary | ICD-10-CM

## 2019-06-29 MED ORDER — ENOXAPARIN SODIUM 150 MG/ML ~~LOC~~ SOLN: 150.0000 mg | SUBCUTANEOUS | 0 refills | Status: DC

## 2019-06-30 ENCOUNTER — Ambulatory Visit: Payer: Medicare Other | Admitting: Gastroenterology

## 2019-06-30 ENCOUNTER — Other Ambulatory Visit (INDEPENDENT_AMBULATORY_CARE_PROVIDER_SITE_OTHER): Payer: Medicare Other

## 2019-06-30 ENCOUNTER — Encounter: Payer: Self-pay | Admitting: Gastroenterology

## 2019-06-30 VITALS — BP 102/72 | HR 76 | Temp 98.3°F | Ht 64.0 in | Wt 194.0 lb

## 2019-06-30 DIAGNOSIS — R899 Unspecified abnormal finding in specimens from other organs, systems and tissues: Secondary | ICD-10-CM | POA: Diagnosis not present

## 2019-06-30 DIAGNOSIS — K317 Polyp of stomach and duodenum: Secondary | ICD-10-CM

## 2019-06-30 DIAGNOSIS — R198 Other specified symptoms and signs involving the digestive system and abdomen: Secondary | ICD-10-CM

## 2019-06-30 DIAGNOSIS — C7A8 Other malignant neuroendocrine tumors: Secondary | ICD-10-CM | POA: Diagnosis not present

## 2019-06-30 DIAGNOSIS — D131 Benign neoplasm of stomach: Secondary | ICD-10-CM

## 2019-06-30 DIAGNOSIS — Z7901 Long term (current) use of anticoagulants: Secondary | ICD-10-CM

## 2019-06-30 DIAGNOSIS — D509 Iron deficiency anemia, unspecified: Secondary | ICD-10-CM

## 2019-06-30 LAB — CBC
HCT: 34.4 % — ABNORMAL LOW (ref 36.0–46.0)
Hemoglobin: 11.4 g/dL — ABNORMAL LOW (ref 12.0–15.0)
MCHC: 33.1 g/dL (ref 30.0–36.0)
MCV: 79.2 fl (ref 78.0–100.0)
Platelets: 231 10*3/uL (ref 150.0–400.0)
RBC: 4.34 Mil/uL (ref 3.87–5.11)
RDW: 18 % — ABNORMAL HIGH (ref 11.5–15.5)
WBC: 7.1 10*3/uL (ref 4.0–10.5)

## 2019-06-30 NOTE — H&P (View-Only) (Signed)
 GASTROENTEROLOGY OUTPATIENT CLINIC VISIT   Primary Care Provider Hunter, Stephen O, MD [address] [place] [address] [phone]  Referring Provider Dr. Gessner  Patient Profile: Erin Kirk is a 63 y.o. female with a pmh significant for GERD, asthma, arthritis, MDD/anxiety, lupus anticoagulant with hypercoagulable state (on anticoagulation), prior VTE/PE, hypothyroidism, gastric adenomas, gastric neuroendocrine tumors.  The patient presents to the [facility] for an evaluation and management of problem(s) noted below:  Problem List 1. 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 Multiple gastric polyps   3. Gastric adenoma   4. Elevated Chromogranin   5. Abnormal findings on esophagogastroduodenoscopy (EGD)   6. Long term (current) use of anticoagulants - warfarin   7. Iron deficiency anemia, unspecified iron deficiency anemia type     History of Present Illness Please see initial consultation note and progress notes by Dr. Gessner for full details of HPI.  Interval History The patient saw Dr. Gessner in July for further evaluation of iron deficiency anemia in the setting of chronic antithrombotic agent use.  She underwent a Lovenox bridge and had an upper and lower endoscopy performed.  The results of those are below.  Most importantly on her upper endoscopy, the patient was found to have multiple 5 to 12 mm sessile polyps with bleeding and some without bleeding stigmata.  The exam of the upper GI tract was otherwise normal.  The biopsies of the area returned showing evidence of gastric adenoma as well as multiple neuroendocrine tumors.  Her chromogranin was noted to be greater than 2000 although she was on PPI therapy.  She underwent a CT of the abdomen/pelvis which showed no significant lymphadenopathy or concern for metastatic disease.  She has been referred to oncology for further discussion of next steps in her  evaluation.  She was referred to me to consider endoscopic management of these adenomas and neuroendocrine tumors.  Unfortunately, the patient lost her husband within the last few weeks and this has been significantly difficult on her and her family.  She has had some early satiety and decreased appetite and potentially some weight loss which she attributes to her social situation.  She is very sad but feels that she does have support at home.  No other significant GI issues that have not already been discussed to Dr. Gessner in the past.  GI Review of Systems Positive as above Negative for dysphagia, change in bowel habits, melena, hematochezia  Review of Systems General: Positive for weight loss as noted above; denies fevers/chills/night sweats HEENT: Denies oral lesions Cardiovascular: Denies chest pain/palpitations Pulmonary: Denies shortness of breath/nocturnal cough Gastroenterological: See HPI Genitourinary: Denies darkened urine or hematuria Hematological: Positive for easy bruising/bleeding due to anticoagulation Endocrine: Denies temperature intolerance Dermatological: Denies jaundice Psychological: Mood is low but not defeated   Medications Current Outpatient Medications  Medication Sig Dispense Refill  . acetaminophen (TYLENOL) 500 MG tablet Take 500 mg by mouth every 6 (six) hours as needed.    . Alum Hydroxide-Mag Carbonate (GAVISCON PO) Take by mouth.    . dicyclomine (BENTYL) 20 MG tablet Take 1 tablet (20 mg total) by mouth every 6 (six) hours as needed for spasms (abdominal cramps, pain). 60 tablet 0  . diltiazem (CARDIZEM CD) 360 MG 24 hr capsule Take 1 capsule by mouth once daily 90 capsule 0  . EUTHYROX 100 MCG tablet Take 1 tablet by mouth once daily 90 tablet 1  . Ferrous Sulfate (IRON) 325 (  65 Fe) MG TABS Take 1 tablet (325 mg total) by mouth daily. 30 tablet 0  . metoprolol succinate (TOPROL-XL) 25 MG 24 hr tablet Take 1 tablet (25 mg total) by mouth daily. 90  tablet 1  . telmisartan (MICARDIS) 40 MG tablet Take 1 tablet (40 mg total) by mouth daily. 90 tablet 1  . triamterene-hydrochlorothiazide (MAXZIDE-25) 37.5-25 MG tablet TAKE 1 TABLET BY MOUTH ONCE DAILY 90 tablet 3  . vitamin B-12 (CYANOCOBALAMIN) 1000 MCG tablet Take 1,000 mcg by mouth daily.    . VITAMIN D PO Take 1 tablet by mouth daily.    . warfarin (COUMADIN) 5 MG tablet TAKE AS DIRECTED BY MOUTH BY  ANTICOAGULATION  CLINIC 105 tablet 0  . enoxaparin (LOVENOX) 150 MG/ML injection Inject 1 mL (150 mg total) into the skin daily. (Patient not taking: Reported on 06/30/2019) 7 mL 0   No current facility-administered medications for this visit.     Allergies Allergies  Allergen Reactions  . Guaifenesin & Derivatives Nausea And Vomiting    Hallucinations  . Iohexol Anaphylaxis, Hives and Shortness Of Breath    Incident 1990, premedicated prior to obtaining IV contrast since.  . Amoxicillin-Pot Clavulanate Swelling and Other (See Comments)    Facial swelling  . Other     Contrast dye    Histories Past Medical History:  Diagnosis Date  . Allergy   . Anxiety   . Arthritis   . Asthma   . Blood transfusion without reported diagnosis   . Cataract   . Clotting disorder (HCC)   . Depression   . Diabetes (HCC)   . GERD (gastroesophageal reflux disease)   . Hypertension   . Iron deficiency anemia 03/30/2019  . Irregular heart beat   . Lupus anticoagulant with hypercoagulable state (HCC)   . Melanoma (HCC)    2005  . Primary hypercoagulable state (HCC)   . 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 Pulmonary embolism (HCC)   . Thyroid disease    hypothyroidism   Past Surgical History:  Procedure Laterality Date  . ABDOMINAL HYSTERECTOMY     noncancerous  . BACK SURGERY  1983  . CHOLECYSTECTOMY    . COLONOSCOPY    . fatty tumor breast     removed  . KNEE ARTHROSCOPY    . melanoma removal     with right neck lymph nodes  .  OOPHORECTOMY    . removal lymphnodes in right neck    . UPPER GASTROINTESTINAL ENDOSCOPY     Social History   Socioeconomic History  . Marital status: Married    Spouse name: Not on file  . Number of children: Not on file  . Years of education: Not on file  . Highest education level: Not on file  Occupational History  . Not on file  Social Needs  . Financial resource strain: Not on file  . Food insecurity    Worry: Not on file    Inability: Not on file  . Transportation needs    Medical: Not on file    Non-medical: Not on file  Tobacco Use  . Smoking status: Never Smoker  . Smokeless tobacco: Never Used  Substance and Sexual Activity  . Alcohol use: No    Alcohol/week: 0.0 standard drinks    Comment: no   . Drug use: No  . Sexual activity: Yes  Lifestyle  . Physical activity    Days per week: Not on file      Minutes per session: Not on file  . Stress: Not on file  Relationships  . Social connections    Talks on phone: Not on file    Gets together: Not on file    Attends religious service: Not on file    Active member of club or organization: Not on file    Attends meetings of clubs or organizations: Not on file    Relationship status: Not on file  . Intimate partner violence    Fear of current or ex partner: Not on file    Emotionally abused: Not on file    Physically abused: Not on file    Forced sexual activity: Not on file  Other Topics Concern  . Not on file  Social History Narrative   Married 1971. 1 child Charles Houchen Jr (by Tim) lives in [place] with 2 grandchildren girls 8,6 in 2015.       Retired after blood clot issues-worked [employer] previously      Hobbies: grandkids, spending time with friends.    Family History  Problem Relation Age of Onset  . Coronary artery disease Mother        Mom 87, Dad 57-smoker  . Diabetes Mother   . Hypertension Father   . Diabetes Father   . Mitral valve prolapse Sister   . Diabetes Sister   . Colon  cancer Neg Hx   . Esophageal cancer Neg Hx   . Pancreatic cancer Neg Hx   . Rectal cancer Neg Hx   . Stomach cancer Neg Hx   . Inflammatory bowel disease Neg Hx   . Liver disease Neg Hx    I have reviewed her medical, social, and family history in detail and updated the electronic medical record as necessary.    PHYSICAL EXAMINATION  BP 102/72 (BP Location: Left Arm, Patient Position: Sitting, Cuff Size: Normal)   Pulse 76   Temp 98.3 F (36.8 C)   Ht 5' 4" (1.626 m) Comment: height measured without shoes  Wt 194 lb (88 kg)   LMP  (LMP Unknown)   BMI 33.30 kg/m  Wt Readings from Last 3 Encounters:  06/30/19 194 lb (88 kg)  03/30/19 209 lb 8 oz (95 kg)  03/09/19 207 lb (93.9 kg)  GEN: Sad but in no acute distress, appears stated age, doesn't appear chronically ill PSYCH: Cooperative, without pressured speech EYE: Conjunctivae pink, sclerae anicteric ENT: MMM, without oral ulcers, no erythema or exudates noted NECK: Supple CV: RR without R/Gs  RESP: CTAB posteriorly, without wheezing GI: NABS, soft, NT/ND, without rebound or guarding, no HSM appreciated MSK/EXT: No significant lower extremity edema SKIN: No jaundice NEURO:  Alert & Oriented x 3, no focal deficits   REVIEW OF DATA  I reviewed the following data at the time of this encounter:  GI Procedures and Studies  August 2020 EGD - Multiple gastric polyps. Biopsied. Largest looks adenomatous and at distal cardia proximal body - broad base 12+ mm. Very friable and oozy so I think this and the other smaller polyps are likely source of iron-deficiency anemia. - The examination was otherwise normal. Diagnosis 1. Surgical [P], proximal gastric polyp (large) - WELL DIFFERENTIATED NEUROENDOCRINE NEOPLASM - SEE COMMENT 2. Surgical [P], distal gastric polyps (multiple) - WELL DIFFERENTIATED NEUROENDOCRINE NEOPLASM - GASTRIC ADENOMA - SEE COMMENT Microscopic Comment 1. and 2. By immunohistochemistry, the neoplastic  cells are positive for CD56, chromogranin, and synaptophysin. The proliferative rate by ki-67 (performed on blocks 1&2) is low (<  2%). Dr. Vic Ripper reviewed the case and agrees with the above diagnosis. A Warthin-Starry stain is performed to determine the possibility of the presence of Helicobacter pylori. The Warthin-Starry stain is NEGATIVE for organisms morphologically consistent with Helicobacter pylori.  Laboratory Studies  August 2020 CTAP IMPRESSION: No acute findings. No evidence of abdominal or pelvic metastatic disease or other significant abnormality.  Imaging Studies  August 2020 CT abdomen pelvis with contrast IMPRESSION: No acute findings. No evidence of abdominal or pelvic metastatic disease or other significant abnormality.   ASSESSMENT  Ms. Canady is a 63 y.o. female with a pmh significant for GERD, asthma, arthritis, MDD/anxiety, lupus anticoagulant with hypercoagulable state (on anticoagulation), prior VTE/PE, hypothyroidism, gastric adenomas, gastric neuroendocrine tumors.  The patient is seen today for evaluation and management of:  1. 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 Multiple gastric polyps   3. Gastric adenoma   4. Elevated Chromogranin   5. Abnormal findings on esophagogastroduodenoscopy (EGD)   6. Long term (current) use of anticoagulants - warfarin   7. Iron deficiency anemia, unspecified iron deficiency anemia type    The patient is hemodynamically stable.  She has evidence of multiple neuroendocrine tumors as well as gastric adenomas.  Her chromogranin is significantly elevated but no evidence of metastatic disease noted on her CT abdomen/pelvis.  Reasonable to consider the role of endoscopic management of this to see how she does and see what happens with chromogranin levels.  Could require extensive resection but if we are able to get good margins on these areas and I think it will be helpful to know how she does.  She will  need close monitoring with oncology due to the elevation in her chromogranin.  She is at increased risk of bleeding due to her chronic anticoagulation and need for Lovenox bridging but we would do can to try managing this if it is possible.  Patient is scheduled for 2 weeks from now however with her recent death of her husband I offered her to postpone if she felt necessary but she feels okay to move forward with things.  Based upon the description and endoscopic pictures I do feel that it is reasonable to pursue an Advanced Polypectomy attempt of the lesions.  Likely will plan to use band ligation EMR but will have to see what we see at time of procedure.  The risks and benefits of endoscopic evaluation were discussed with the patient; these include but are not limited to the risk of perforation, infection, bleeding, missed lesions, lack of diagnosis, severe illness requiring hospitalization, as well as anesthesia and sedation related illnesses.  During attempts at advanced polypectomy, the risks of bleeding and perforation/leak are increased as opposed to diagnostic endoscopies, and that was discussed with the patient as well.  Subsequent short-interval endoscopic evaluation for follow up and potential retreatment of the lesion/area may be necessary.  If, after attempt at removal of the lesions, it is found that the patient has a complication or that an invasive lesion or malignant lesion is found, or that the polyp continues to recur, the patient is aware and understands that surgery may still be indicated/required.  All patient questions were answered, to the best of my ability, and the patient agrees to the aforementioned plan of action with follow-up as indicated.   PLAN  Laboratories as outlined below Lovenox bridging as per prior EGD EGD/EUS with EMR attempt in coming weeks Continue PPI therapy for now   Orders Placed  This Encounter  Procedures  . CBC    New Prescriptions   No medications on  file   Modified Medications   No medications on file    Planned Follow Up No follow-ups on file.   Halston Fairclough Mansouraty, MD [facility] Advanced Endoscopy Office # [phone]  

## 2019-06-30 NOTE — Patient Instructions (Signed)
If you are age 65 or older, your body mass index should be between 23-30. Your Body mass index is 33.3 kg/m. If this is out of the aforementioned range listed, please consider follow up with your Primary Care Provider.  Your provider has requested that you go to the basement level for lab work before leaving today. Press "B" on the elevator. The lab is located at the first door on the left as you exit the elevator.  You have been scheduled for an endoscopy. Please follow written instructions given to you at your visit today. If you use inhalers (even only as needed), please bring them with you on the day of your procedure.  Thank you for choosing me and [facility].  Dr. Rush Landmark

## 2019-06-30 NOTE — Progress Notes (Signed)
 GASTROENTEROLOGY OUTPATIENT CLINIC VISIT   Primary Care Provider Hunter, Stephen O, MD [address] [place] [address] [phone]  Referring Provider Dr. Gessner  Patient Profile: Erin Kirk is a 63 y.o. female with a pmh significant for GERD, asthma, arthritis, MDD/anxiety, lupus anticoagulant with hypercoagulable state (on anticoagulation), prior VTE/PE, hypothyroidism, gastric adenomas, gastric neuroendocrine tumors.  The patient presents to the [facility] for an evaluation and management of problem(s) noted below:  Problem List 1. 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 Multiple gastric polyps   3. Gastric adenoma   4. Elevated Chromogranin   5. Abnormal findings on esophagogastroduodenoscopy (EGD)   6. Long term (current) use of anticoagulants - warfarin   7. Iron deficiency anemia, unspecified iron deficiency anemia type     History of Present Illness Please see initial consultation note and progress notes by Dr. Gessner for full details of HPI.  Interval History The patient saw Dr. Gessner in July for further evaluation of iron deficiency anemia in the setting of chronic antithrombotic agent use.  She underwent a Lovenox bridge and had an upper and lower endoscopy performed.  The results of those are below.  Most importantly on her upper endoscopy, the patient was found to have multiple 5 to 12 mm sessile polyps with bleeding and some without bleeding stigmata.  The exam of the upper GI tract was otherwise normal.  The biopsies of the area returned showing evidence of gastric adenoma as well as multiple neuroendocrine tumors.  Her chromogranin was noted to be greater than 2000 although she was on PPI therapy.  She underwent a CT of the abdomen/pelvis which showed no significant lymphadenopathy or concern for metastatic disease.  She has been referred to oncology for further discussion of next steps in her  evaluation.  She was referred to me to consider endoscopic management of these adenomas and neuroendocrine tumors.  Unfortunately, the patient lost her husband within the last few weeks and this has been significantly difficult on her and her family.  She has had some early satiety and decreased appetite and potentially some weight loss which she attributes to her social situation.  She is very sad but feels that she does have support at home.  No other significant GI issues that have not already been discussed to Dr. Gessner in the past.  GI Review of Systems Positive as above Negative for dysphagia, change in bowel habits, melena, hematochezia  Review of Systems General: Positive for weight loss as noted above; denies fevers/chills/night sweats HEENT: Denies oral lesions Cardiovascular: Denies chest pain/palpitations Pulmonary: Denies shortness of breath/nocturnal cough Gastroenterological: See HPI Genitourinary: Denies darkened urine or hematuria Hematological: Positive for easy bruising/bleeding due to anticoagulation Endocrine: Denies temperature intolerance Dermatological: Denies jaundice Psychological: Mood is low but not defeated   Medications Current Outpatient Medications  Medication Sig Dispense Refill  . acetaminophen (TYLENOL) 500 MG tablet Take 500 mg by mouth every 6 (six) hours as needed.    . Alum Hydroxide-Mag Carbonate (GAVISCON PO) Take by mouth.    . dicyclomine (BENTYL) 20 MG tablet Take 1 tablet (20 mg total) by mouth every 6 (six) hours as needed for spasms (abdominal cramps, pain). 60 tablet 0  . diltiazem (CARDIZEM CD) 360 MG 24 hr capsule Take 1 capsule by mouth once daily 90 capsule 0  . EUTHYROX 100 MCG tablet Take 1 tablet by mouth once daily 90 tablet 1  . Ferrous Sulfate (IRON) 325 (  65 Fe) MG TABS Take 1 tablet (325 mg total) by mouth daily. 30 tablet 0  . metoprolol succinate (TOPROL-XL) 25 MG 24 hr tablet Take 1 tablet (25 mg total) by mouth daily. 90  tablet 1  . telmisartan (MICARDIS) 40 MG tablet Take 1 tablet (40 mg total) by mouth daily. 90 tablet 1  . triamterene-hydrochlorothiazide (MAXZIDE-25) 37.5-25 MG tablet TAKE 1 TABLET BY MOUTH ONCE DAILY 90 tablet 3  . vitamin B-12 (CYANOCOBALAMIN) 1000 MCG tablet Take 1,000 mcg by mouth daily.    . VITAMIN D PO Take 1 tablet by mouth daily.    . warfarin (COUMADIN) 5 MG tablet TAKE AS DIRECTED BY MOUTH BY  ANTICOAGULATION  CLINIC 105 tablet 0  . enoxaparin (LOVENOX) 150 MG/ML injection Inject 1 mL (150 mg total) into the skin daily. (Patient not taking: Reported on 06/30/2019) 7 mL 0   No current facility-administered medications for this visit.     Allergies Allergies  Allergen Reactions  . Guaifenesin & Derivatives Nausea And Vomiting    Hallucinations  . Iohexol Anaphylaxis, Hives and Shortness Of Breath    Incident 1990, premedicated prior to obtaining IV contrast since.  . Amoxicillin-Pot Clavulanate Swelling and Other (See Comments)    Facial swelling  . Other     Contrast dye    Histories Past Medical History:  Diagnosis Date  . Allergy   . Anxiety   . Arthritis   . Asthma   . Blood transfusion without reported diagnosis   . Cataract   . Clotting disorder (HCC)   . Depression   . Diabetes (HCC)   . GERD (gastroesophageal reflux disease)   . Hypertension   . Iron deficiency anemia 03/30/2019  . Irregular heart beat   . Lupus anticoagulant with hypercoagulable state (HCC)   . Melanoma (HCC)    2005  . Primary hypercoagulable state (HCC)   . 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 Pulmonary embolism (HCC)   . Thyroid disease    hypothyroidism   Past Surgical History:  Procedure Laterality Date  . ABDOMINAL HYSTERECTOMY     noncancerous  . BACK SURGERY  1983  . CHOLECYSTECTOMY    . COLONOSCOPY    . fatty tumor breast     removed  . KNEE ARTHROSCOPY    . melanoma removal     with right neck lymph nodes  .  OOPHORECTOMY    . removal lymphnodes in right neck    . UPPER GASTROINTESTINAL ENDOSCOPY     Social History   Socioeconomic History  . Marital status: Married    Spouse name: Not on file  . Number of children: Not on file  . Years of education: Not on file  . Highest education level: Not on file  Occupational History  . Not on file  Social Needs  . Financial resource strain: Not on file  . Food insecurity    Worry: Not on file    Inability: Not on file  . Transportation needs    Medical: Not on file    Non-medical: Not on file  Tobacco Use  . Smoking status: Never Smoker  . Smokeless tobacco: Never Used  Substance and Sexual Activity  . Alcohol use: No    Alcohol/week: 0.0 standard drinks    Comment: no   . Drug use: No  . Sexual activity: Yes  Lifestyle  . Physical activity    Days per week: Not on file      Minutes per session: Not on file  . Stress: Not on file  Relationships  . Social Herbalist on phone: Not on file    Gets together: Not on file    Attends religious service: Not on file    Active member of club or organization: Not on file    Attends meetings of clubs or organizations: Not on file    Relationship status: Not on file  . Intimate partner violence    Fear of current or ex partner: Not on file    Emotionally abused: Not on file    Physically abused: Not on file    Forced sexual activity: Not on file  Other Topics Concern  . Not on file  Social History Narrative   Married 1971. 1 child Lilliana Turner (by [employer]) lives in [place] with 2 grandchildren girls 8,6 in 2015.       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    Family History  Problem Relation Age of Onset  . Coronary artery disease Mother        Mom 43, Dad 57-smoker  . Diabetes Mother   . Hypertension Father   . Diabetes Father   . Mitral valve prolapse Sister   . Diabetes Sister   . Colon  cancer Neg Hx   . Esophageal cancer Neg Hx   . Pancreatic cancer Neg Hx   . Rectal cancer Neg Hx   . Stomach cancer Neg Hx   . Inflammatory bowel disease Neg Hx   . Liver disease Neg Hx    I have reviewed her medical, social, and family history in detail and updated the electronic medical record as necessary.    PHYSICAL EXAMINATION  BP 102/72 (BP Location: Left Arm, Patient Position: Sitting, Cuff Size: Normal)   Pulse 76   Temp 98.3 F (36.8 C)   Ht 5' 4" (1.626 m) Comment: height measured without shoes  Wt 194 lb (88 kg)   LMP  (LMP Unknown)   BMI 33.30 kg/m  Wt Readings from Last 3 Encounters:  06/30/19 194 lb (88 kg)  03/30/19 209 lb 8 oz (95 kg)  03/09/19 207 lb (93.9 kg)  GEN: Sad but in no acute distress, appears stated age, doesn't appear chronically ill PSYCH: Cooperative, without pressured speech EYE: Conjunctivae pink, sclerae anicteric ENT: MMM, without oral ulcers, no erythema or exudates noted NECK: Supple CV: RR without R/Gs  RESP: CTAB posteriorly, without wheezing GI: NABS, soft, NT/ND, without rebound or guarding, no HSM appreciated MSK/EXT: No significant lower extremity edema SKIN: No jaundice NEURO:  Alert & Oriented x 3, no focal deficits   REVIEW OF DATA  I reviewed the following data at the time of this encounter:  GI Procedures and Studies  August 2020 EGD - Multiple gastric polyps. Biopsied. Largest looks adenomatous and at distal cardia proximal body - broad base 12+ mm. Very friable and oozy so I think this and the other smaller polyps are likely source of iron-deficiency anemia. - The examination was otherwise normal. Diagnosis 1. Surgical [P], proximal gastric polyp (large) - WELL DIFFERENTIATED NEUROENDOCRINE NEOPLASM - SEE COMMENT 2. Surgical [P], distal gastric polyps (multiple) - WELL DIFFERENTIATED NEUROENDOCRINE NEOPLASM - GASTRIC ADENOMA - SEE COMMENT Microscopic Comment 1. and 2. By immunohistochemistry, the neoplastic  cells are positive for CD56, chromogranin, and synaptophysin. The proliferative rate by ki-67 (performed on blocks 1&2) is low (<  2%). Dr. Vic Ripper reviewed the case and agrees with the above diagnosis. A Warthin-Starry stain is performed to determine the possibility of the presence of Helicobacter pylori. The Warthin-Starry stain is NEGATIVE for organisms morphologically consistent with Helicobacter pylori.  Laboratory Studies  August 2020 CTAP IMPRESSION: No acute findings. No evidence of abdominal or pelvic metastatic disease or other significant abnormality.  Imaging Studies  August 2020 CT abdomen pelvis with contrast IMPRESSION: No acute findings. No evidence of abdominal or pelvic metastatic disease or other significant abnormality.   ASSESSMENT  Ms. Canady is a 63 y.o. female with a pmh significant for GERD, asthma, arthritis, MDD/anxiety, lupus anticoagulant with hypercoagulable state (on anticoagulation), prior VTE/PE, hypothyroidism, gastric adenomas, gastric neuroendocrine tumors.  The patient is seen today for evaluation and management of:  1. 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 Multiple gastric polyps   3. Gastric adenoma   4. Elevated Chromogranin   5. Abnormal findings on esophagogastroduodenoscopy (EGD)   6. Long term (current) use of anticoagulants - warfarin   7. Iron deficiency anemia, unspecified iron deficiency anemia type    The patient is hemodynamically stable.  She has evidence of multiple neuroendocrine tumors as well as gastric adenomas.  Her chromogranin is significantly elevated but no evidence of metastatic disease noted on her CT abdomen/pelvis.  Reasonable to consider the role of endoscopic management of this to see how she does and see what happens with chromogranin levels.  Could require extensive resection but if we are able to get good margins on these areas and I think it will be helpful to know how she does.  She will  need close monitoring with oncology due to the elevation in her chromogranin.  She is at increased risk of bleeding due to her chronic anticoagulation and need for Lovenox bridging but we would do can to try managing this if it is possible.  Patient is scheduled for 2 weeks from now however with her recent death of her husband I offered her to postpone if she felt necessary but she feels okay to move forward with things.  Based upon the description and endoscopic pictures I do feel that it is reasonable to pursue an Advanced Polypectomy attempt of the lesions.  Likely will plan to use band ligation EMR but will have to see what we see at time of procedure.  The risks and benefits of endoscopic evaluation were discussed with the patient; these include but are not limited to the risk of perforation, infection, bleeding, missed lesions, lack of diagnosis, severe illness requiring hospitalization, as well as anesthesia and sedation related illnesses.  During attempts at advanced polypectomy, the risks of bleeding and perforation/leak are increased as opposed to diagnostic endoscopies, and that was discussed with the patient as well.  Subsequent short-interval endoscopic evaluation for follow up and potential retreatment of the lesion/area may be necessary.  If, after attempt at removal of the lesions, it is found that the patient has a complication or that an invasive lesion or malignant lesion is found, or that the polyp continues to recur, the patient is aware and understands that surgery may still be indicated/required.  All patient questions were answered, to the best of my ability, and the patient agrees to the aforementioned plan of action with follow-up as indicated.   PLAN  Laboratories as outlined below Lovenox bridging as per prior EGD EGD/EUS with EMR attempt in coming weeks Continue PPI therapy for now   Orders Placed  This Encounter  Procedures  . CBC    New Prescriptions   No medications on  file   Modified Medications   No medications on file    Planned Follow Up No follow-ups on file.   Gabriel Mansouraty, MD  Gastroenterology Advanced Endoscopy Office # [phone]  

## 2019-07-01 ENCOUNTER — Encounter: Payer: Self-pay | Admitting: Gastroenterology

## 2019-07-02 DIAGNOSIS — K317 Polyp of stomach and duodenum: Secondary | ICD-10-CM | POA: Insufficient documentation

## 2019-07-02 DIAGNOSIS — D131 Benign neoplasm of stomach: Secondary | ICD-10-CM | POA: Insufficient documentation

## 2019-07-02 DIAGNOSIS — R899 Unspecified abnormal finding in specimens from other organs, systems and tissues: Secondary | ICD-10-CM | POA: Insufficient documentation

## 2019-07-02 DIAGNOSIS — R198 Other specified symptoms and signs involving the digestive system and abdomen: Secondary | ICD-10-CM | POA: Insufficient documentation

## 2019-07-03 ENCOUNTER — Telehealth: Payer: Self-pay

## 2019-07-03 ENCOUNTER — Other Ambulatory Visit: Payer: Self-pay | Admitting: Family Medicine

## 2019-07-03 DIAGNOSIS — D649 Anemia, unspecified: Secondary | ICD-10-CM

## 2019-07-03 NOTE — Telephone Encounter (Signed)
Pt advised that she will need repeat labs in 3-4 weeks. Lab order in epic.

## 2019-07-04 ENCOUNTER — Ambulatory Visit: Payer: Medicare Other | Admitting: Oncology

## 2019-07-04 DIAGNOSIS — Z1231 Encounter for screening mammogram for malignant neoplasm of breast: Secondary | ICD-10-CM | POA: Diagnosis not present

## 2019-07-08 ENCOUNTER — Other Ambulatory Visit (HOSPITAL_COMMUNITY)
Admission: RE | Admit: 2019-07-08 | Discharge: 2019-07-08 | Disposition: A | Discharge status: Home / Self Care | Payer: Medicare Other | Source: Ambulatory Visit | Attending: Gastroenterology | Admitting: Gastroenterology

## 2019-07-08 DIAGNOSIS — Z20828 Contact with and (suspected) exposure to other viral communicable diseases: Secondary | ICD-10-CM | POA: Diagnosis not present

## 2019-07-08 DIAGNOSIS — Z01812 Encounter for preprocedural laboratory examination: Secondary | ICD-10-CM | POA: Diagnosis not present

## 2019-07-09 LAB — NOVEL CORONAVIRUS, NAA (HOSP ORDER, SEND-OUT TO REF LAB; TAT 18-24 HRS): SARS-CoV-2, NAA: NOT DETECTED

## 2019-07-11 ENCOUNTER — Encounter (HOSPITAL_COMMUNITY): Payer: Self-pay | Admitting: *Deleted

## 2019-07-11 ENCOUNTER — Other Ambulatory Visit: Payer: Self-pay

## 2019-07-11 NOTE — Progress Notes (Signed)
Mrs Guerrette has a history of type II diabetes, she has been on medication in the past but is not at this time. Patient said she will talk with PCP after she has finished this work up.  Patient no longer has a CBG machine. PCP is Dr. Rushie Chestnut.  Mrs Eustaquio is mourning the loss of her husband of 55 years, he died in 06/25/2023. Patient states that she will not take any medications for depression.

## 2019-07-12 ENCOUNTER — Observation Stay (HOSPITAL_COMMUNITY)
Admission: RE | Admit: 2019-07-12 | Discharge: 2019-07-13 | Disposition: A | Discharge status: Home / Self Care | Payer: Medicare Other | Attending: Internal Medicine | Admitting: Internal Medicine

## 2019-07-12 ENCOUNTER — Other Ambulatory Visit: Payer: Self-pay

## 2019-07-12 ENCOUNTER — Ambulatory Visit (HOSPITAL_COMMUNITY): Payer: Medicare Other | Admitting: Anesthesiology

## 2019-07-12 ENCOUNTER — Encounter (HOSPITAL_COMMUNITY)
Admission: RE | Disposition: A | Discharge status: Home / Self Care | Payer: Self-pay | Source: Home / Self Care | Attending: Internal Medicine

## 2019-07-12 ENCOUNTER — Ambulatory Visit (HOSPITAL_COMMUNITY): Payer: Medicare Other

## 2019-07-12 ENCOUNTER — Encounter (HOSPITAL_COMMUNITY): Payer: Self-pay | Admitting: Gastroenterology

## 2019-07-12 DIAGNOSIS — M1990 Unspecified osteoarthritis, unspecified site: Secondary | ICD-10-CM | POA: Insufficient documentation

## 2019-07-12 DIAGNOSIS — I129 Hypertensive chronic kidney disease with stage 1 through stage 4 chronic kidney disease, or unspecified chronic kidney disease: Secondary | ICD-10-CM | POA: Diagnosis not present

## 2019-07-12 DIAGNOSIS — Z86711 Personal history of pulmonary embolism: Secondary | ICD-10-CM | POA: Diagnosis not present

## 2019-07-12 DIAGNOSIS — J45909 Unspecified asthma, uncomplicated: Secondary | ICD-10-CM | POA: Diagnosis not present

## 2019-07-12 DIAGNOSIS — Z5181 Encounter for therapeutic drug level monitoring: Secondary | ICD-10-CM

## 2019-07-12 DIAGNOSIS — Z7901 Long term (current) use of anticoagulants: Secondary | ICD-10-CM

## 2019-07-12 DIAGNOSIS — E1122 Type 2 diabetes mellitus with diabetic chronic kidney disease: Secondary | ICD-10-CM | POA: Insufficient documentation

## 2019-07-12 DIAGNOSIS — K219 Gastro-esophageal reflux disease without esophagitis: Secondary | ICD-10-CM | POA: Diagnosis not present

## 2019-07-12 DIAGNOSIS — Z79890 Hormone replacement therapy (postmenopausal): Secondary | ICD-10-CM | POA: Insufficient documentation

## 2019-07-12 DIAGNOSIS — Z85820 Personal history of malignant melanoma of skin: Secondary | ICD-10-CM | POA: Diagnosis not present

## 2019-07-12 DIAGNOSIS — D6862 Lupus anticoagulant syndrome: Secondary | ICD-10-CM | POA: Diagnosis not present

## 2019-07-12 DIAGNOSIS — C7A8 Other malignant neuroendocrine tumors: Secondary | ICD-10-CM

## 2019-07-12 DIAGNOSIS — I480 Paroxysmal atrial fibrillation: Secondary | ICD-10-CM | POA: Diagnosis not present

## 2019-07-12 DIAGNOSIS — E119 Type 2 diabetes mellitus without complications: Secondary | ICD-10-CM | POA: Diagnosis present

## 2019-07-12 DIAGNOSIS — E039 Hypothyroidism, unspecified: Secondary | ICD-10-CM | POA: Diagnosis not present

## 2019-07-12 DIAGNOSIS — I4891 Unspecified atrial fibrillation: Secondary | ICD-10-CM | POA: Diagnosis present

## 2019-07-12 DIAGNOSIS — E669 Obesity, unspecified: Secondary | ICD-10-CM | POA: Insufficient documentation

## 2019-07-12 DIAGNOSIS — N183 Chronic kidney disease, stage 3 unspecified: Secondary | ICD-10-CM | POA: Diagnosis present

## 2019-07-12 DIAGNOSIS — Z8249 Family history of ischemic heart disease and other diseases of the circulatory system: Secondary | ICD-10-CM | POA: Insufficient documentation

## 2019-07-12 DIAGNOSIS — R079 Chest pain, unspecified: Secondary | ICD-10-CM | POA: Diagnosis present

## 2019-07-12 DIAGNOSIS — E1136 Type 2 diabetes mellitus with diabetic cataract: Secondary | ICD-10-CM | POA: Insufficient documentation

## 2019-07-12 DIAGNOSIS — I899 Noninfective disorder of lymphatic vessels and lymph nodes, unspecified: Secondary | ICD-10-CM

## 2019-07-12 DIAGNOSIS — D131 Benign neoplasm of stomach: Secondary | ICD-10-CM | POA: Diagnosis present

## 2019-07-12 DIAGNOSIS — K317 Polyp of stomach and duodenum: Secondary | ICD-10-CM | POA: Diagnosis not present

## 2019-07-12 DIAGNOSIS — Z20828 Contact with and (suspected) exposure to other viral communicable diseases: Secondary | ICD-10-CM | POA: Diagnosis not present

## 2019-07-12 DIAGNOSIS — D3A092 Benign carcinoid tumor of the stomach: Secondary | ICD-10-CM | POA: Insufficient documentation

## 2019-07-12 DIAGNOSIS — Z79899 Other long term (current) drug therapy: Secondary | ICD-10-CM | POA: Insufficient documentation

## 2019-07-12 DIAGNOSIS — K3189 Other diseases of stomach and duodenum: Secondary | ICD-10-CM | POA: Diagnosis not present

## 2019-07-12 DIAGNOSIS — D6859 Other primary thrombophilia: Secondary | ICD-10-CM | POA: Diagnosis present

## 2019-07-12 DIAGNOSIS — E1169 Type 2 diabetes mellitus with other specified complication: Secondary | ICD-10-CM | POA: Diagnosis present

## 2019-07-12 DIAGNOSIS — C49A2 Gastrointestinal stromal tumor of stomach: Secondary | ICD-10-CM | POA: Diagnosis not present

## 2019-07-12 DIAGNOSIS — R109 Unspecified abdominal pain: Secondary | ICD-10-CM | POA: Diagnosis not present

## 2019-07-12 HISTORY — PX: SUBMUCOSAL LIFTING INJECTION: SHX6855

## 2019-07-12 HISTORY — PX: EUS: SHX5427

## 2019-07-12 HISTORY — PX: ENDOSCOPIC MUCOSAL RESECTION: SHX6839

## 2019-07-12 HISTORY — PX: HEMOSTASIS CONTROL: SHX6838

## 2019-07-12 HISTORY — PX: ESOPHAGOGASTRODUODENOSCOPY (EGD) WITH PROPOFOL: SHX5813

## 2019-07-12 HISTORY — PX: HEMOSTASIS CLIP PLACEMENT: SHX6857

## 2019-07-12 HISTORY — PX: POLYPECTOMY: SHX5525

## 2019-07-12 HISTORY — DX: Hypothyroidism, unspecified: E03.9

## 2019-07-12 LAB — CBC
HCT: 32.5 % — ABNORMAL LOW (ref 36.0–46.0)
Hemoglobin: 10.4 g/dL — ABNORMAL LOW (ref 12.0–15.0)
MCH: 26.4 pg (ref 26.0–34.0)
MCHC: 32 g/dL (ref 30.0–36.0)
MCV: 82.5 fL (ref 80.0–100.0)
Platelets: 190 10*3/uL (ref 150–400)
RBC: 3.94 MIL/uL (ref 3.87–5.11)
RDW: 15.4 % (ref 11.5–15.5)
WBC: 7.7 10*3/uL (ref 4.0–10.5)
nRBC: 0 % (ref 0.0–0.2)

## 2019-07-12 LAB — COMPREHENSIVE METABOLIC PANEL
ALT: 26 U/L (ref 0–44)
AST: 28 U/L (ref 15–41)
Albumin: 3.3 g/dL — ABNORMAL LOW (ref 3.5–5.0)
Alkaline Phosphatase: 51 U/L (ref 38–126)
Anion gap: 11 (ref 5–15)
BUN: 12 mg/dL (ref 8–23)
CO2: 23 mmol/L (ref 22–32)
Calcium: 8.8 mg/dL — ABNORMAL LOW (ref 8.9–10.3)
Chloride: 103 mmol/L (ref 98–111)
Creatinine, Ser: 1.24 mg/dL — ABNORMAL HIGH (ref 0.44–1.00)
GFR calc Af Amer: 53 mL/min — ABNORMAL LOW (ref >60)
GFR calc non Af Amer: 46 mL/min — ABNORMAL LOW (ref >60)
Glucose, Bld: 165 mg/dL — ABNORMAL HIGH (ref 70–99)
Potassium: 3.6 mmol/L (ref 3.5–5.1)
Sodium: 137 mmol/L (ref 135–145)
Total Bilirubin: 1 mg/dL (ref 0.3–1.2)
Total Protein: 5.9 g/dL — ABNORMAL LOW (ref 6.5–8.1)

## 2019-07-12 LAB — TROPONIN I (HIGH SENSITIVITY)
Troponin I (High Sensitivity): <2 ng/L (ref <18)
Troponin I (High Sensitivity): 3 ng/L (ref <18)

## 2019-07-12 LAB — GLUCOSE, CAPILLARY: Glucose-Capillary: 157 mg/dL — ABNORMAL HIGH (ref 70–99)

## 2019-07-12 SURGERY — UPPER ENDOSCOPIC ULTRASOUND (EUS) RADIAL
Anesthesia: General

## 2019-07-12 MED ORDER — LEVOTHYROXINE SODIUM 100 MCG PO TABS
100.0000 ug | ORAL_TABLET | Freq: Every day | ORAL | Status: DC
  Administered 2019-07-13: 100 ug via ORAL
  Filled 2019-07-12: qty 1

## 2019-07-12 MED ORDER — INSULIN ASPART 100 UNIT/ML ~~LOC~~ SOLN
0.0000 [IU] | Freq: Three times a day (TID) | SUBCUTANEOUS | Status: DC
  Administered 2019-07-13: 1 [IU] via SUBCUTANEOUS

## 2019-07-12 MED ORDER — LIDOCAINE VISCOUS HCL 2 % MT SOLN
15.0000 mL | Freq: Once | OROMUCOSAL | Status: AC
  Administered 2019-07-12: 15 mL via ORAL

## 2019-07-12 MED ORDER — FENTANYL CITRATE (PF) 100 MCG/2ML IJ SOLN
INTRAMUSCULAR | Status: AC
  Filled 2019-07-12: qty 2

## 2019-07-12 MED ORDER — ROCURONIUM BROMIDE 10 MG/ML (PF) SYRINGE
PREFILLED_SYRINGE | INTRAVENOUS | Status: DC | PRN
  Administered 2019-07-12: 40 mg via INTRAVENOUS

## 2019-07-12 MED ORDER — ACETAMINOPHEN 325 MG PO TABS
650.0000 mg | ORAL_TABLET | ORAL | Status: DC | PRN
  Administered 2019-07-12 – 2019-07-13 (×2): 650 mg via ORAL
  Filled 2019-07-12 (×2): qty 2

## 2019-07-12 MED ORDER — DICYCLOMINE HCL 20 MG PO TABS: 20.0000 mg | ORAL_TABLET | Freq: Four times a day (QID) | ORAL | Status: DC | PRN

## 2019-07-12 MED ORDER — ONDANSETRON HCL 4 MG/2ML IJ SOLN: 4.0000 mg | Freq: Four times a day (QID) | INTRAMUSCULAR | Status: DC | PRN

## 2019-07-12 MED ORDER — ONDANSETRON HCL 4 MG/2ML IJ SOLN
INTRAMUSCULAR | Status: AC
  Filled 2019-07-12: qty 2

## 2019-07-12 MED ORDER — SODIUM CHLORIDE 0.9 % IV SOLN: INTRAVENOUS | Status: DC

## 2019-07-12 MED ORDER — MIDAZOLAM HCL 5 MG/5ML IJ SOLN
INTRAMUSCULAR | Status: DC | PRN
  Administered 2019-07-12: 1 mg via INTRAVENOUS

## 2019-07-12 MED ORDER — DILTIAZEM HCL ER COATED BEADS 180 MG PO CP24
360.0000 mg | ORAL_CAPSULE | Freq: Every day | ORAL | Status: DC
  Administered 2019-07-12: 360 mg via ORAL
  Filled 2019-07-12: qty 2

## 2019-07-12 MED ORDER — SODIUM CHLORIDE (PF) 0.9 % IJ SOLN
PREFILLED_SYRINGE | INTRAMUSCULAR | Status: DC | PRN
  Administered 2019-07-12: 12:00:00 7 mL

## 2019-07-12 MED ORDER — ONDANSETRON HCL 4 MG/2ML IJ SOLN
4.0000 mg | Freq: Once | INTRAMUSCULAR | Status: AC
  Administered 2019-07-12: 4 mg via INTRAVENOUS

## 2019-07-12 MED ORDER — FENTANYL CITRATE (PF) 100 MCG/2ML IJ SOLN
12.5000 ug | Freq: Once | INTRAMUSCULAR | Status: AC
  Administered 2019-07-12: 12.5 ug via INTRAVENOUS

## 2019-07-12 MED ORDER — OMEPRAZOLE 40 MG PO CPDR: 40.0000 mg | DELAYED_RELEASE_CAPSULE | Freq: Two times a day (BID) | ORAL | 4 refills | Status: DC

## 2019-07-12 MED ORDER — EPINEPHRINE 1 MG/10ML IJ SOSY
PREFILLED_SYRINGE | INTRAMUSCULAR | Status: AC
  Filled 2019-07-12: qty 10

## 2019-07-12 MED ORDER — PANTOPRAZOLE SODIUM 40 MG PO TBEC
40.0000 mg | DELAYED_RELEASE_TABLET | Freq: Two times a day (BID) | ORAL | Status: DC
  Administered 2019-07-12 – 2019-07-13 (×2): 40 mg via ORAL
  Filled 2019-07-12 (×2): qty 1

## 2019-07-12 MED ORDER — LACTATED RINGERS IV SOLN
INTRAVENOUS | Status: DC | PRN
  Administered 2019-07-12 (×2): via INTRAVENOUS

## 2019-07-12 MED ORDER — TRIAMTERENE-HCTZ 37.5-25 MG PO TABS
1.0000 | ORAL_TABLET | Freq: Every day | ORAL | Status: DC
  Administered 2019-07-13: 1 via ORAL
  Filled 2019-07-12: qty 1

## 2019-07-12 MED ORDER — LIDOCAINE VISCOUS HCL 2 % MT SOLN
OROMUCOSAL | Status: AC
  Filled 2019-07-12: qty 15

## 2019-07-12 MED ORDER — SUCCINYLCHOLINE CHLORIDE 20 MG/ML IJ SOLN
INTRAMUSCULAR | Status: DC | PRN
  Administered 2019-07-12: 80 mg via INTRAVENOUS

## 2019-07-12 MED ORDER — SUGAMMADEX SODIUM 200 MG/2ML IV SOLN
INTRAVENOUS | Status: DC | PRN
  Administered 2019-07-12: 200 mg via INTRAVENOUS

## 2019-07-12 MED ORDER — ALUM & MAG HYDROXIDE-SIMETH 200-200-20 MG/5ML PO SUSP
30.0000 mL | Freq: Once | ORAL | Status: AC
  Administered 2019-07-12: 30 mL via ORAL
  Filled 2019-07-12 (×2): qty 30

## 2019-07-12 MED ORDER — ONDANSETRON HCL 4 MG/2ML IJ SOLN
INTRAMUSCULAR | Status: DC | PRN
  Administered 2019-07-12: 4 mg via INTRAVENOUS

## 2019-07-12 MED ORDER — PROPOFOL 10 MG/ML IV BOLUS
INTRAVENOUS | Status: DC | PRN
  Administered 2019-07-12: 150 mg via INTRAVENOUS

## 2019-07-12 MED ORDER — IRBESARTAN 300 MG PO TABS
150.0000 mg | ORAL_TABLET | Freq: Every day | ORAL | Status: DC
  Administered 2019-07-12 – 2019-07-13 (×2): 150 mg via ORAL
  Filled 2019-07-12 (×2): qty 1

## 2019-07-12 MED ORDER — SODIUM CHLORIDE 0.9 % IV SOLN
Freq: Once | INTRAVENOUS | Status: AC
  Administered 2019-07-12: 18:00:00 via INTRAVENOUS

## 2019-07-12 MED ORDER — FENTANYL CITRATE (PF) 100 MCG/2ML IJ SOLN: 25.0000 ug | INTRAMUSCULAR | Status: DC | PRN

## 2019-07-12 MED ORDER — DEXAMETHASONE SODIUM PHOSPHATE 10 MG/ML IJ SOLN
INTRAMUSCULAR | Status: DC | PRN
  Administered 2019-07-12: 6 mg via INTRAVENOUS

## 2019-07-12 MED ORDER — ALUM & MAG HYDROXIDE-SIMETH 200-200-20 MG/5ML PO SUSP: 30.0000 mL | Freq: Four times a day (QID) | ORAL | Status: DC | PRN

## 2019-07-12 MED ORDER — FENTANYL CITRATE (PF) 250 MCG/5ML IJ SOLN
INTRAMUSCULAR | Status: DC | PRN
  Administered 2019-07-12 (×3): 50 ug via INTRAVENOUS

## 2019-07-12 MED ORDER — METOPROLOL SUCCINATE ER 25 MG PO TB24
25.0000 mg | ORAL_TABLET | Freq: Every day | ORAL | Status: DC
  Administered 2019-07-13: 25 mg via ORAL
  Filled 2019-07-12: qty 1

## 2019-07-12 MED ORDER — LIDOCAINE 2% (20 MG/ML) 5 ML SYRINGE
INTRAMUSCULAR | Status: DC | PRN
  Administered 2019-07-12: 40 mg via INTRAVENOUS

## 2019-07-12 SURGICAL SUPPLY — 15 items

## 2019-07-12 NOTE — Interval H&P Note (Signed)
History and Physical Interval Note:  07/12/2019 8:51 AM  Erin Kirk  has presented today for surgery, with the diagnosis of neuroendocrine tumor-polyp.  The various methods of treatment have been discussed with the patient and family. After consideration of risks, benefits and other options for treatment, the patient has consented to  Procedure(s): UPPER ENDOSCOPIC ULTRASOUND (EUS) RADIAL (N/A) ESOPHAGOGASTRODUODENOSCOPY (EGD) WITH PROPOFOL (N/A) ENDOSCOPIC MUCOSAL RESECTION (N/A) as a surgical intervention.  The patient's history has been reviewed, patient examined, no change in status, stable for surgery.  I have reviewed the patient's chart and labs.  Questions were answered to the patient's satisfaction.     [employer]

## 2019-07-12 NOTE — Progress Notes (Addendum)
Pt started to complain of midsternal chest pain/pressure (8/10) MD aware. Pt denies N/V. Denies shortness of breath. Orders for EKG, chest XR, abd XR placed.

## 2019-07-12 NOTE — Progress Notes (Signed)
Have discussed with the medical service the reasoning for observation status. They have asked to go ahead and move forward with a CBC/CMP and troponin even though the pretest probability is low for cardiac issues that she still having issues it is worthwhile from their perspective. I will give her a GI cocktail in effort of trying to help her symptoms. We will continue to give her some pain medications on an as-needed basis but she currently remains stable and is comfortable appearing. GI Cocktail has helped a bit. Discussed with son reasoning for observation and he agrees. GI Inpatient team aware of her status and will be evaluated tomorrow. If patient's issues do not resolve over course of the next few hours, would consider role of CT-CAP to ensure no true complications have been missed with current workup. If Anticoagulation or Anti-PLT therapy were to be indicated overnight, then OK from GI perspective. However, as per procedure note, would ideally wait on restarting Lovenox until 10/22 evening and Coumadin at same time if possible to decrease risk of post-procedural bleeding.  Justice Britain, MD [facility] Office # [id number]

## 2019-07-12 NOTE — Op Note (Signed)
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 Estimated Blood Loss:     Estimated blood loss was minimal. Procedure:                Pre-Anesthesia Assessment:                           - Prior to the procedure, a History and Physical                            was performed, and patient medications and                            allergies were reviewed. The patient's tolerance of                            previous anesthesia was also reviewed. The risks                            and benefits of the procedure and the sedation                            options and risks were discussed with the patient.                            All questions were answered, and informed consent                            was obtained. Prior Anticoagulants: The patient                            last took Coumadin (warfarin)  5 days and Lovenox                            (enoxaparin) 1 day prior to the procedure. ASA                            Grade Assessment: III - A patient with severe                            systemic disease. After reviewing the risks and                            benefits, the patient was deemed in satisfactory                            condition to undergo the  procedure.                           After obtaining informed consent, the endoscope was                            passed under direct vision. Throughout the                            procedure, the patient's blood pressure, pulse, and                            oxygen saturations were monitored continuously. The                            [id number] ([id number]) Olympus therapeutic                            gastroscope was introduced through the mouth, and                            advanced to the second part of duodenum. After                            obtaining informed consent, the endoscope was                            passed under direct vision. Throughout the                            procedure, the patient's blood pressure, pulse, and                            oxygen saturations were monitored continuously. The                            [id number]-[id number] ([id number]) Olympus Radial EUS scope was  introduced through the mouth, and advanced to the                            duodenum for ultrasound examination from the                            stomach and duodenum. The upper EUS was                            accomplished without difficulty. The patient                            tolerated the procedure. Scope In: Scope Out: Findings:      ENDOSCOPIC FINDING: :      No gross lesions were noted in the entire esophagus.      A single 18 mm semi-sessile polyp with bleeding and stigmata of recent       bleeding was found in the cardia. Preparations were made for mucosal       resection. Orise gel was injected to raise the lesion. Snare mucosal       resection was performed. Resection and retrieval were complete. Mild       oozing was noted at the edge of the resection margin and a small vessel       and was successfully injected with 2 mL of a 1:10,000 solution of       epinephrine for hemostasis. Soft coagulation for hemostasis using snare       tip was  successful to ablate the vessel and also to decrease risk of       recurrent polyp. To prevent bleeding after mucosal resection, six       hemostatic clips were successfully placed (MR conditional). There was no       bleeding at the end of the procedure. This was placed in Jar 4.      Two 8 to 12 mm semi-sessile polyps with no bleeding and stigmata of       recent bleeding were found on the lesser curvature of the stomach.       Preparations were made for mucosal resection. Orise gel was injected to       raise the lesion. Band ligator and snare mucosal resection was       performed. Resection and retrieval were complete. To prevent bleeding       after mucosal resection, a total of five hemostatic clips were       successfully placed (MR conditional) three clips on one of the polyps       and two clips on the other. There was no bleeding during, or at the end,       of the procedure. These were placed in Jar 1.      Three 6 to 12 mm sessile polyps with no bleeding and stigmata of recent       bleeding were found in the gastric body. Preparations were made for       mucosal resection. Orise gel was injected to raise the lesion. Band       ligator and snare mucosal resection was performed. Resection and       retrieval were complete. One one of the body polyp resection sites had       noted oozing. This  region was injected with 2 cc of epinephrine to aid       in hemostasis. Soft coagulation for hemostasis using snare tip was       successful. To prevent bleeding after mucosal resection, a total of nine       hemostatic clips were successfully placed (MR conditional) - four clips       on one polyp, 2 clips on another polyp, 3 clips on another polyp. There       was no bleeding at the end of the procedure. These were placed in Jar 2.      One 13 mm semi-sessile polyp with no bleeding and no stigmata of recent       bleeding was found on the posterior wall of the stomach. Preparations        were made for mucosal resection. Orise gel was injected to raise the       lesion. Band ligator and snare mucosal resection was performed.       Resection and retrieval were complete. To prevent bleeding after mucosal       resection, two hemostatic clips were successfully placed (MR       conditional). There was no bleeding at the end of the procedure. This       was placed in Jar 3      Normal mucosa was found in the gastric antrum.      No gross lesions were noted in the duodenal bulb, in the first portion       of the duodenum and in the second portion of the duodenum.      ENDOSONOGRAPHIC FINDING: :      Mucosal lesions were found in the cardia of the stomach and in the body       of the stomach - correlating with the polyps noted above.       Sonographically, the lesions appeared to originate from the luminal       interface/superficial mucosa (Layer 1) and deep mucosa (Layer 2). There       was no invasion of the muscularis propria.      No malignant-appearing lymph nodes were visualized in the left gastric       region (level 17), gastrohepatic ligament (level 18), celiac region       (level 20) and perigastric region.      Endosonographic imaging in the visualized portion of the liver showed no       mass.      The celiac region was visualized. Impression:               EGD Impression:                           - No gross lesions in esophagus.                           - A single gastric polyp. Resected and retrieved.                            Injected. Treated with a hot snare. Clips (MR                            conditional) were placed.                           -  Two gastric polyps. Resected and retrieved. Clips                            (MR conditional) were placed.                           - Three gastric polyps. Resected and retrieved.                            Clips (MR conditional) were placed.                           - One gastric polyp. Resected and retrieved.  Clips                            (MR conditional) were placed.                           - Normal mucosa was found in the antrum.                           - No gross lesions in the duodenal bulb, in the                            first portion of the duodenum and in the second                            portion of the duodenum.                           EUS Impression:                           - Mucosal lesions were found in the cardia of the                            stomach and in the body of the stomach. The lesions                            appeared to originate from within the luminal                            interface/superficial mucosa (Layer 1) and deep                            mucosa (Layer 2) without involvement of the                            muscularis propria. These are consistent with the                            previous NETs and Gastric Adenoma noted on prior  procedure.                           - No malignant-appearing lymph nodes were                            visualized in the left gastric region (level 17),                            gastrohepatic ligament (level 18), celiac region                            (level 20) and perigastric region. Recommendation:           - The patient will be observed post-procedure,                            until all discharge criteria are met.                           - Discharge patient to home.                           - Patient has a contact number available for                            emergencies. The signs and symptoms of potential                            delayed complications were discussed with the                            patient. Return to normal activities tomorrow.                            Written discharge instructions were provided to the                            patient.                           - Clear liquid diet today.                           - Start Omeprazole  40 mg BID for next 6-8 weeks (Rx                            sent to pharmacy) in order to help with healing of                            the gastric mucosa s/p resections.                           - Ideally, I would like the patient to restart  Lovenox in 48 hours, but due to her medical                            history, this is not possible and it is felt that                            24 hours was what was deemed safe. We will proceed                            with starting Lovenox no sooner thatn 10/22 in the                            PM. May also restart Coumadin as per clinic                            instructions.                           - Monitor for signs/symptoms of bleeding,                            perforation, and infection. If issues please call                            our number to get further assistance as needed.                           - Follow through with upcoming Oncology evaluation.                            Follow up with Dr. Carlean Purl as already scheduled.                           - Follow up EGD in 3-4 months to re-evaluate the                            region and potentially remove further polyps if                            present.                           - WIll plan to repeat Chromogranin level in near                            future, but determine based on all parties involved.                           - The findings and recommendations were discussed                            with the patient.                           -  The findings and recommendations were discussed                            with the patient's family. Procedure Code(s):        --- Professional ---                           [phone], Esophagogastroduodenoscopy, flexible,                            transoral; with endoscopic mucosal resection                           [id number], Esophagogastroduodenoscopy, flexible,                             transoral; with endoscopic ultrasound examination                            limited to the esophagus, stomach or duodenum, and                            adjacent structures Diagnosis Code(s):        --- Professional ---                           K31.7, Polyp of stomach and duodenum                           K31.89, Other diseases of stomach and duodenum                           I89.9, Noninfective disorder of lymphatic vessels                            and lymph nodes, unspecified                           K92.9, Disease of digestive system, unspecified CPT copyright 2019 [employer]. All rights reserved. The codes documented in this report are preliminary and upon coder review may  be revised to meet current compliance requirements. Justice Britain, MD 07/12/2019 1:09:39 PM Number of Addenda: 0

## 2019-07-12 NOTE — Anesthesia Procedure Notes (Signed)
Procedure Name: Intubation Date/Time: 07/12/2019 10:45 AM Performed by: Mariea Clonts, CRNA Pre-anesthesia Checklist: Patient identified, Emergency Drugs available, Suction available and Patient being monitored Patient Re-evaluated:Patient Re-evaluated prior to induction Oxygen Delivery Method: Circle System Utilized Preoxygenation: Pre-oxygenation with 100% oxygen Induction Type: IV induction, Cricoid Pressure applied and Rapid sequence Laryngoscope Size: Mac and 3 Grade View: Grade II Tube type: Oral Tube size: 7.0 mm Number of attempts: 1 Airway Equipment and Method: Stylet and Oral airway Placement Confirmation: ETT inserted through vocal cords under direct vision,  positive ETCO2 and breath sounds checked- equal and bilateral Tube secured with: Tape Dental Injury: Teeth and Oropharynx as per pre-operative assessment

## 2019-07-12 NOTE — H&P (Signed)
History and Physical    Erin Kirk [id number] DOB: July 07, 1956 DOA: 07/12/2019  Referring MD/NP/PA: Justice Britain, MD PCP: Marin Olp, MD  Patient coming from: Endoscopic  Chief Complaint: Chest pain  I have personally briefly reviewed patient's old medical records in [place]   HPI: Erin Kirk is a 63 y.o. female with medical history significant of hypertension, diabetes mellitus type 2, hypothyroidism, VTE/PE, lupus anticoagulant on chronic anticoagulation, gastric adenomas, and neuroendocrine tumor-polyp, anxiety, asthma, and GERD.  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 Carlean Purl in July.  Her chromogranin was noted to be greater than 2000.  Evaluation did not show signs of metastatic disease.  Patient had been on Lovenox bridge with her last dose being yesterday.  She followed up with Dr. Rush Landmark today to undergo EGD with resection of polyps and neuroendocrine tumor.  During the procedure patient was noted to have significant bleeding.  Bleeding stabilized with the use of placement of clips and administration of epinephrine.  Plan was to start patient on a clear liquid diet, omeprazole 40 mg twice daily for 6 to 8 weeks, and possibly restart Lovenox and Coumadin on 10/22.  Following the procedure patient was noted to have complaints of substernal chest pain that she rated as 8 out of 10.  She described as mildly pleuritic in nature.  Labs revealed hemoglobin 10.4 and initial troponin was negative.  Chest x-ray noted left basilar atelectasis with numerous clips in the left upper quadrant.  Associated symptoms included nausea.  Patient was given a total of 25 mcg of fentanyl, GI cocktail, and Zofran without relief of symptoms.  TRH called to admit for observation overnight for chest pain.  She rates chest pain as a 6 out of 10 on the pain scale at  this time.  ED Course: As seen above  Review of Systems  Eyes: Negative for photophobia and pain.  Respiratory: Negative for shortness of breath.   Cardiovascular: Positive for chest pain.  Gastrointestinal: Positive for abdominal pain and nausea. Negative for vomiting.  Neurological: Negative for loss of consciousness.    Past Medical History:  Diagnosis Date  . Allergy   . Anxiety   . Arthritis   . Asthma    07/11/2019- allergies  . Blood transfusion without reported diagnosis   . Cataract   . Clotting disorder ([place])   . Depression   . Diabetes ([place])    not on medication  . GERD (gastroesophageal reflux disease)   . Hypertension   . Hypothyroidism   . Iron deficiency anemia 03/30/2019  . Irregular heart beat    tachycardia  . Lupus anticoagulant with hypercoagulable state ([place])   . Melanoma ([place])    2005  . Pneumonia 2019  . Primary hypercoagulable state ([place])   . 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 Pulmonary embolism ([place])   . Thyroid disease    hypothyroidism    Past Surgical History:  Procedure Laterality Date  . ABDOMINAL HYSTERECTOMY     noncancerous  . BACK SURGERY  1983  . CHOLECYSTECTOMY    . COLONOSCOPY    . fatty tumor breast Right    removed  . HYSTEROTOMY    . KNEE ARTHROSCOPY Left   . Lymp nodes Right 08/2004  . MELANOMA EXCISION Right    face   . OOPHORECTOMY Bilateral   .  removal lymphnodes in right neck    . UPPER GASTROINTESTINAL ENDOSCOPY       reports that she has never smoked. She has never used smokeless tobacco. She reports that she does not drink alcohol or use drugs.  Allergies  Allergen Reactions  . Guaifenesin & Derivatives Nausea And Vomiting    Hallucinations  . Iohexol Anaphylaxis, Hives and Shortness Of Breath    Incident 1990, premedicated prior to obtaining IV contrast since.  . Amoxicillin-Pot Clavulanate Swelling and Other (See Comments)    Facial swelling  .  Other     Contrast dye    Family History  Problem Relation Age of Onset  . Coronary artery disease Mother        Mom 99, Dad 57-smoker  . Diabetes Mother   . Hypertension Father   . Diabetes Father   . Mitral valve prolapse Sister   . Diabetes Sister   . Colon cancer Neg Hx   . Esophageal cancer Neg Hx   . Pancreatic cancer Neg Hx   . Rectal cancer Neg Hx   . Stomach cancer Neg Hx   . Inflammatory bowel disease Neg Hx   . Liver disease Neg Hx     Prior to Admission medications   Medication Sig Start Date End Date Taking? Authorizing Provider  acetaminophen (TYLENOL) 500 MG tablet Take 500 mg by mouth every 6 (six) hours as needed.   Yes [provider]  Alum Hydroxide-Mag Carbonate (GAVISCON PO) Take 1 tablet by mouth as needed (indigestion).    Yes [provider]  Cholecalciferol (VITAMIN D3) 50 MCG (2000 UT) TABS Take 2,000 Units by mouth daily.   Yes [provider]  diltiazem (CARDIZEM CD) 360 MG 24 hr capsule Take 1 capsule by mouth once daily Patient taking differently: Take 360 mg by mouth at bedtime.  06/07/19  Yes Marin Olp, MD  enoxaparin (LOVENOX) 150 MG/ML injection Inject 1 mL (150 mg total) into the skin daily. Patient taking differently: Inject 150 mg into the skin daily.  06/29/19  Yes Marin Olp, MD  EUTHYROX 100 MCG tablet Take 1 tablet by mouth once daily Patient taking differently: Take 100 mcg by mouth daily before breakfast.  06/12/19  Yes Marin Olp, MD  Ferrous Sulfate (IRON) 325 (65 Fe) MG TABS Take 1 tablet (325 mg total) by mouth daily. Patient taking differently: Take 325 mg by mouth every other day.  05/17/19  Yes Gatha Mayer, MD  levothyroxine (SYNTHROID) 100 MCG tablet Take 100 mcg by mouth daily before breakfast.   Yes [provider]  metoprolol succinate (TOPROL-XL) 25 MG 24 hr tablet Take 1 tablet (25 mg total) by mouth daily. 01/05/19  Yes Marin Olp, MD  telmisartan (MICARDIS) 40  MG tablet Take 1 tablet by mouth once daily Patient taking differently: Take 40 mg by mouth at bedtime.  07/03/19  Yes Marin Olp, MD  triamterene-hydrochlorothiazide ([id number]) 37.5-25 MG tablet TAKE 1 TABLET BY MOUTH ONCE DAILY Patient taking differently: Take 1 tablet by mouth daily.  08/22/18  Yes Marin Olp, MD  vitamin B-12 (CYANOCOBALAMIN) 1000 MCG tablet Take 1,000 mcg by mouth daily.   Yes [provider]  warfarin (COUMADIN) 5 MG tablet TAKE AS DIRECTED BY MOUTH BY  ANTICOAGULATION  CLINIC Patient taking differently: Take 5 mg by mouth See admin instructions. Take 2.5 mg on Wed and Friday at 16:00 All the other days take 5 mg 05/03/19  Yes Hunter,  Brayton Mars, MD  dicyclomine (BENTYL) 20 MG tablet Take 1 tablet (20 mg total) by mouth every 6 (six) hours as needed for spasms (abdominal cramps, pain). 05/05/19   Gatha Mayer, MD  omeprazole (PRILOSEC) 40 MG capsule Take 1 capsule (40 mg total) by mouth 2 (two) times daily before a meal. 07/12/19 12/09/19  Mansouraty, Telford Nab., MD    Physical Exam:  Constitutional: Elderly female appears to be in some discomfort Vitals:   07/12/19 1513 07/12/19 1535 07/12/19 1555 07/12/19 1649  BP: 127/71 140/75 116/66 134/72  Pulse: 69 73 78 73  Resp: 13 19 15 16   Temp:    98.6 F (37 C)  TempSrc:    Oral  SpO2: 100% 97% 96% 97%  Weight:    89.8 kg  Height:    5\' 4"  (1.626 m)   Eyes: PERRL, lids and conjunctivae normal ENMT: Mucous membranes are moist. Posterior pharynx clear of any exudate or lesions. .  Neck: normal, supple, no masses, no thyromegaly Respiratory: clear to auscultation bilaterally, no wheezing, no crackles. Normal respiratory effort. No accessory muscle use.  Cardiovascular: Regular rate and rhythm, no murmurs / rubs / gallops. No extremity edema. 2+ pedal pulses. No carotid bruits.  Abdomen: Mild left epigastric tenderness, no masses palpated. No hepatosplenomegaly. Bowel sounds positive.   Musculoskeletal: no clubbing / cyanosis. No joint deformity upper and lower extremities. Good ROM, no contractures. Normal muscle tone.  Skin: no rashes, lesions, ulcers. No induration Neurologic: CN 2-12 grossly intact. Sensation intact, DTR normal. Strength 5/5 in all 4.  Psychiatric: Normal judgment and insight. Alert and oriented x 3. Normal mood.     Labs on Admission: I have personally reviewed following labs and imaging studies  CBC: Recent Labs  Lab 07/12/19 1353  WBC 7.7  HGB 10.4*  HCT 32.5*  MCV 82.5  PLT 99991111   Basic Metabolic Panel: Recent Labs  Lab 07/12/19 1353  NA 137  K 3.6  CL 103  CO2 23  GLUCOSE 165*  BUN 12  CREATININE 1.24*  CALCIUM 8.8*   GFR: Estimated Creatinine Clearance: 49.1 mL/min (A) (by C-G formula based on SCr of 1.24 mg/dL (H)). Liver Function Tests: Recent Labs  Lab 07/12/19 1353  AST 28  ALT 26  ALKPHOS 51  BILITOT 1.0  PROT 5.9*  ALBUMIN 3.3*   No results for input(s): LIPASE, AMYLASE in the last 168 hours. No results for input(s): AMMONIA in the last 168 hours. Coagulation Profile: No results for input(s): INR, PROTIME in the last 168 hours. Cardiac Enzymes: No results for input(s): CKTOTAL, CKMB, CKMBINDEX, TROPONINI in the last 168 hours. BNP (last 3 results) No results for input(s): PROBNP in the last 8760 hours. HbA1C: No results for input(s): HGBA1C in the last 72 hours. CBG: No results for input(s): GLUCAP in the last 168 hours. Lipid Profile: No results for input(s): CHOL, HDL, LDLCALC, TRIG, CHOLHDL, LDLDIRECT in the last 72 hours. Thyroid Function Tests: No results for input(s): TSH, T4TOTAL, FREET4, T3FREE, THYROIDAB in the last 72 hours. Anemia Panel: No results for input(s): VITAMINB12, FOLATE, FERRITIN, TIBC, IRON, RETICCTPCT in the last 72 hours. Urine analysis:    Component Value Date/Time   COLORURINE yellow 04/09/2009 0841   APPEARANCEUR Clear 04/09/2009 0841   LABSPEC 1.020 04/09/2009 0841    PHURINE 8.5 04/09/2009 0841   HGBUR negative 04/09/2009 0841   BILIRUBINUR n 06/18/2016 1615   PROTEINUR trace 06/18/2016 1615   UROBILINOGEN 0.2 06/18/2016 1615   UROBILINOGEN 0.2  04/09/2009 0841   NITRITE n 06/18/2016 1615   NITRITE negative 04/09/2009 0841   LEUKOCYTESUR Negative 06/18/2016 1615   Sepsis Labs: Recent Results (from the past 240 hour(s))  Novel Coronavirus, NAA (hospital order; send-out to ref lab)     Status: None   Collection Time: 07/08/19  9:48 AM   Specimen: Nasopharyngeal Swab; Respiratory  Result Value Ref Range Status   SARS-CoV-2, NAA NOT DETECTED NOT DETECTED Final    Comment: (NOTE) This nucleic acid amplification test was developed and its performance characteristics determined by [employer]. Nucleic acid amplification tests include PCR and TMA. This test has not been FDA cleared or approved. This test has been authorized by FDA under an Emergency Use Authorization (EUA). This test is only authorized for the duration of time the declaration that circumstances exist justifying the authorization of the emergency use of in vitro diagnostic tests for detection of SARS-CoV-2 virus and/or diagnosis of COVID-19 infection under section 564(b)(1) of the Act, 21 U.S.C. [id number]) (1), unless the authorization is terminated or revoked sooner. When diagnostic testing is negative, the possibility of a false negative result should be considered in the context of a patient's recent exposures and the presence of clinical signs and symptoms consistent with COVID-19. An individual without symptoms of COVID- 19 and who is not shedding SARS-CoV-2 vi rus would expect to have a negative (not detected) result in this assay. Performed At: [facility] [address] [place], [place] [id number] Rush Farmer MD [id number]    [place]  Final    Comment: Performed at [facility], [place] [address], [place], [place]  [address]     Radiological Exams on Admission: Dg Chest Port 1 View  Result Date: 07/12/2019 CLINICAL DATA:  Chest pain after EGD today. EXAM: PORTABLE CHEST 1 VIEW COMPARISON:  Chest x-ray 09/06/2018 FINDINGS: The cardiac silhouette, mediastinal and hilar contours are within normal limits. There is patchy E left basilar atelectasis. No pleural effusion or pneumothorax. Numerous clips are noted in the left upper quadrant in the region of the stomach. IMPRESSION: Left basilar atelectasis. Numerous clips noted in the left upper quadrant. Electronically Signed   By: Marijo Sanes M.D.   On: 07/12/2019 13:56   Dg Abd Portable 2v  Result Date: 07/12/2019 CLINICAL DATA:  Pain after upper endoscopy EXAM: PORTABLE ABDOMEN - 2 VIEW COMPARISON:  CT 05/18/2019 FINDINGS: A normal volume gas in the stomach. No evidence of retroperitoneal or intraperitoneal gas. Normal volume of gas in the colon. Gas the rectum. There is a cluster of radiodense foreign bodies projecting over the stomach. There approximately 20 of these oblong radiodense foreign bodies measuring 1.6 cm each in presumably surgical vascular clips. IMPRESSION: 1. No evidence of intraperitoneal free air or retroperitoneal free air following from doxy. 2. Cluster of radiodense foreign bodies projecting over the stomach are presumably vascular clips. Approximately such clips/foreign bodies. Recommend clinical correlation. Electronically Signed   By: Suzy Bouchard M.D.   On: 07/12/2019 14:05    EKG: Independently reviewed.  Normal sinus rhythm at 79 bpm with nonspecific T wave changes.  Assessment/Plan Chest pain: Patient reports substernal chest pain with radiation to her back and shoulder blades.  Patient initial troponin negative and EKG unchanged from previous.  Suspect symptoms could be secondary to procedure and less likely cardiac in nature. -Admit to a medical telemetry bed for observation  -Clear liquid diet -Follow-up repeat troponin -CT  angiogram of the chest changed to a VQ scan  due to contrast allergy -GI cocktail as needed -Follow-up telemetry overnight -Consider need cardiology eval in a.m. if symptoms persist.  Neuroendocrine gastric tumor, gastric adenoma: Patient status post resection with recent placement clips.  Chromogranin and previously noted to be elevated up to 200 patient wants to be started on a clear liquid diet with omeprazole 40 mg twice daily over the next 6 to 8 weeks. -Continue outpatient follow-up Dr. Carlean Purl  Normocytic anemia: Acute on chronic.  Hemoglobin appears to drop down to 10.4 g/dL.  Prior to procedure noted to be 11.4.  Patient was reported to have some bleeding during the procedure requiring epinephrine in addition to clipping to control. -Recheck CBC in a.m. -Consider need of abdominal imaging if hemoglobin continues to drop  Chronic kidney disease stage III: Creatinine 1.21 on admission, but baseline appears to range from 1-1.2. -Continue to monitor   Essential hypertension: Blood pressures currently stable. -Continue Cardizem, metoprolol, triamterene-hydrochlorothiazide, and pharmacy substitution Avapro  Lupus anticoagulant with history of VTE, chronic anticoagulation: Due to bleeding during procedure was recommended for patient not to start on Lovenox until in the afternoon on 10/22. -Reassess in a.m. and determine when medically appropriate to restart Lovenox and Coumadin.  Diet-controlled diabetes mellitus type 2: Glucose 165 on admission.  Suspect secondary to acute distress.  Last hemoglobin A1c noted to be 7.1 on 03/10/2019. -Hypoglycemic protocols -CBGs before every meal with sensitive SSI  Paroxysmal atrial fibrillation: Patient currently appears to be in sinus rhythm. -Restart anticoagulation when medically appropriate  Hypothyroidism -Continue levothyroxine  GERD -Pharmacy substitution of Protonix for omeprazole  DVT prophylaxis: SCDs Code Status: full Family  Communication: Discussed with son Disposition Plan: possible discharge home Consults called: none Admission status: observation  Norval Morton MD [facility] Pager [phone]   If 7PM-7AM, please contact night-coverage [email or web address] Password [facility]  07/12/2019, 4:56 PM

## 2019-07-12 NOTE — Anesthesia Postprocedure Evaluation (Signed)
Anesthesia Post Note  Patient: Erin Kirk  Procedure(s) Performed: UPPER ENDOSCOPIC ULTRASOUND (EUS) RADIAL (N/A ) ESOPHAGOGASTRODUODENOSCOPY (EGD) WITH PROPOFOL (N/A ) ENDOSCOPIC MUCOSAL RESECTION (N/A ) SUBMUCOSAL LIFTING INJECTION POLYPECTOMY HEMOSTASIS CONTROL HEMOSTASIS CLIP PLACEMENT     Patient location during evaluation: Endoscopy Anesthesia Type: General Level of consciousness: awake and alert Pain management: pain level controlled Vital Signs Assessment: post-procedure vital signs reviewed and stable Respiratory status: spontaneous breathing, nonlabored ventilation and respiratory function stable Cardiovascular status: blood pressure returned to baseline and stable Postop Assessment: no apparent nausea or vomiting Anesthetic complications: no    Last Vitals:  Vitals:   07/12/19 1255 07/12/19 1303  BP: 131/68 126/73  Pulse: 82 82  Resp: 20 19  Temp:    SpO2: 90% 97%    Last Pain:  Vitals:   07/12/19 1255  TempSrc:   PainSc: 0-No pain                 Lidia Collum

## 2019-07-12 NOTE — Anesthesia Preprocedure Evaluation (Signed)
Anesthesia Evaluation  Patient identified by MRN, date of birth, ID band Patient awake    Reviewed: Allergy & Precautions, NPO status , Patient's Chart, lab work & pertinent test results  History of Anesthesia Complications Negative for: history of anesthetic complications  Airway Mallampati: III  TM Distance: >3 FB Neck ROM: Full    Dental  (+) Teeth Intact   Pulmonary asthma , PE   Pulmonary exam normal        Cardiovascular hypertension, Pt. on medications and Pt. on home beta blockers + DVT  Normal cardiovascular exam     Neuro/Psych negative neurological ROS  negative psych ROS   GI/Hepatic Neg liver ROS, GERD  ,Neuroendocrine tumor of stomach   Endo/Other  diabetesHypothyroidism   Renal/GU Renal InsufficiencyRenal disease  negative genitourinary   Musculoskeletal negative musculoskeletal ROS (+)   Abdominal   Peds  Hematology Lupus anticoagulant with h/o DVT/PE- on home coumadin   Anesthesia Other Findings   Reproductive/Obstetrics                             Anesthesia Physical Anesthesia Plan  ASA: III  Anesthesia Plan: General   Post-op Pain Management:    Induction: Intravenous and Rapid sequence  PONV Risk Score and Plan: 3 and Ondansetron, Dexamethasone, Treatment may vary due to age or medical condition and Midazolam  Airway Management Planned: Oral ETT  Additional Equipment: None  Intra-op Plan:   Post-operative Plan: Extubation in OR  Informed Consent: I have reviewed the patients History and Physical, chart, labs and discussed the procedure including the risks, benefits and alternatives for the proposed anesthesia with the patient or authorized representative who has indicated his/her understanding and acceptance.     Dental advisory given  Plan Discussed with:   Anesthesia Plan Comments:         Anesthesia Quick Evaluation

## 2019-07-12 NOTE — Transfer of Care (Signed)
Immediate Anesthesia Transfer of Care Note  Patient: Erin Kirk  Procedure(s) Performed: UPPER ENDOSCOPIC ULTRASOUND (EUS) RADIAL (N/A ) ESOPHAGOGASTRODUODENOSCOPY (EGD) WITH PROPOFOL (N/A ) ENDOSCOPIC MUCOSAL RESECTION (N/A ) SUBMUCOSAL LIFTING INJECTION POLYPECTOMY HEMOSTASIS CONTROL HEMOSTASIS CLIP PLACEMENT  Patient Location: Endoscopy Unit  Anesthesia Type:General  Level of Consciousness: awake, alert  and oriented  Airway & Oxygen Therapy: Patient Spontanous Breathing and Patient connected to nasal cannula oxygen  Post-op Assessment: Report given to RN, Post -op Vital signs reviewed and stable and Patient moving all extremities X 4  Post vital signs: Reviewed and stable  Last Vitals:  Vitals Value Taken Time  BP 138/64 07/12/19 1244  Temp 36.8 C 07/12/19 1244  Pulse 93 07/12/19 1244  Resp 27 07/12/19 1244  SpO2 99 % 07/12/19 1244  Vitals shown include unvalidated device data.  Last Pain:  Vitals:   07/12/19 1244  TempSrc: Temporal  PainSc: Asleep         Complications: No apparent anesthesia complications

## 2019-07-12 NOTE — Progress Notes (Signed)
Pt in room sleeping with no visible distress, still drowsy post sedation. This RN awoke this pt by calling her name and asked pt if her chest was still hurting. Pt nodded her head, verbally answered "yes" and drifted back to sleep.

## 2019-07-12 NOTE — Progress Notes (Signed)
Patient remains hemodynamically stable. Chest x-ray and KUB have been reviewed showed no evidence of postprocedural complication. Patient resting comfortably but still having issues of chest discomfort as well as nausea. She will get some more pain medication as well as some antiemetic. EKG does not look to be grossly remarkable compared to prior. We will obtain a CBC and CMP. We will consider a troponin. Discussed case with patient's son and I suspect unless she is dramatically improving that she will need to come in for postprocedural observation. I am awaiting a callback from the [facility] admissions service to discuss her case. We will update accordingly.  Justice Britain, MD [facility] Office # [phone]

## 2019-07-12 NOTE — Progress Notes (Signed)
Erin Kirk is a 63 y.o. female patient admitted from ED awake, alert - oriented  X 4 - no acute distress noted.  VSS - Blood pressure 134/72, pulse 73, temperature 98.6 F (37 C), temperature source Oral, resp. rate 16, height 5\' 4"  (1.626 m), weight 89.8 kg, SpO2 97 %.    IV in place, occlusive dsg intact without redness.  Orientation to room, and floor completed with information packet given to patient. Patient able to verbalize understanding of risk associated with falls, and verbalized understanding to call nsg before up out of bed.  Call light within reach, patient able to voice, and demonstrate understanding.     Will cont to eval and treat per MD orders.  Odelia Gage, RN 07/12/2019 5:11 PM

## 2019-07-12 NOTE — Discharge Instructions (Signed)
YOU HAD AN ENDOSCOPIC PROCEDURE TODAY: Refer to the procedure report and other information in the discharge instructions given to you for any specific questions about what was found during the examination. If this information does not answer your questions, please call [place] office at [phone] to clarify.   YOU SHOULD EXPECT: Some feelings of bloating in the abdomen. Passage of more gas than usual. Walking can help get rid of the air that was put into your GI tract during the procedure and reduce the bloating. If you had a lower endoscopy (such as a colonoscopy or flexible sigmoidoscopy) you may notice spotting of blood in your stool or on the toilet paper. Some abdominal soreness may be present for a day or two, also.  DIET: Your first meal following the procedure should be a light meal and then it is ok to progress to your normal diet. A half-sandwich or bowl of soup is an example of a good first meal. Heavy or fried foods are harder to digest and may make you feel nauseous or bloated. Drink plenty of fluids but you should avoid alcoholic beverages for 24 hours. If you had a esophageal dilation, please see attached instructions for diet.    ACTIVITY: Your care partner should take you home directly after the procedure. You should plan to take it easy, moving slowly for the rest of the day. You can resume normal activity the day after the procedure however YOU SHOULD NOT DRIVE, use power tools, machinery or perform tasks that involve climbing or major physical exertion for 24 hours (because of the sedation medicines used during the test).   SYMPTOMS TO REPORT IMMEDIATELY: A gastroenterologist can be reached at any hour. Please call [phone]  for any of the following symptoms:   Following upper endoscopy (EGD, EUS, ERCP, esophageal dilation) Vomiting of blood or coffee ground material  New, significant abdominal pain  New, significant chest pain or pain under the shoulder blades  Painful or  persistently difficult swallowing  New shortness of breath  Black, tarry-looking or red, bloody stools  FOLLOW UP:  If any biopsies were taken you will be contacted by phone or by letter within the next 1-3 weeks. Call [phone]  if you have not heard about the biopsies in 3 weeks.  Please also call with any specific questions about appointments or follow up tests.  

## 2019-07-12 NOTE — Progress Notes (Signed)
Patient notation was completed. I went to see the patient and she is waking up from anesthesia still. Has been hemodynamically stable with vital signs that are normotensive as well as a heart rate in the 80s to 90s. As I was speaking with her she is complaining of significant midsternal chest discomfort that is nonradiating.  It is pressure-like in quality.  She describes never having this previously. No shortness of breath. Her lung exam is otherwise unremarkable.  I am unable to appreciate any rubs or gallops. Patient describes the pressure as a 7-8 out of 10 discomfort. I wonder how much of this is referred discomfort as a result of the epinephrine that we used on multiple areas within the stomach lining.  I usually see patients have some discomfort after this. I am going to proceed with obtaining a chest x-ray as well as a 2 view abdomen x-ray. We will obtain an EKG and a rhythm strip and follow that up thereafter. Patient continues to have issues we will have to consider admission into the hospital for monitoring post procedure. Called patient's son to update him and had to leave a message.  We will reevaluate her in a few minutes once of the imaging has been completed and the EKG has been completed.  She remains hemodynamically stable otherwise.   Justice Britain, MD [facility] # [phone]

## 2019-07-13 ENCOUNTER — Observation Stay (HOSPITAL_COMMUNITY): Payer: Medicare Other

## 2019-07-13 DIAGNOSIS — D3A092 Benign carcinoid tumor of the stomach: Secondary | ICD-10-CM | POA: Diagnosis not present

## 2019-07-13 DIAGNOSIS — Z8249 Family history of ischemic heart disease and other diseases of the circulatory system: Secondary | ICD-10-CM | POA: Diagnosis not present

## 2019-07-13 DIAGNOSIS — K317 Polyp of stomach and duodenum: Secondary | ICD-10-CM | POA: Diagnosis not present

## 2019-07-13 DIAGNOSIS — Z79899 Other long term (current) drug therapy: Secondary | ICD-10-CM | POA: Diagnosis not present

## 2019-07-13 DIAGNOSIS — Z86711 Personal history of pulmonary embolism: Secondary | ICD-10-CM | POA: Diagnosis not present

## 2019-07-13 DIAGNOSIS — K219 Gastro-esophageal reflux disease without esophagitis: Secondary | ICD-10-CM | POA: Diagnosis not present

## 2019-07-13 DIAGNOSIS — R079 Chest pain, unspecified: Secondary | ICD-10-CM

## 2019-07-13 DIAGNOSIS — Z85820 Personal history of malignant melanoma of skin: Secondary | ICD-10-CM | POA: Diagnosis not present

## 2019-07-13 DIAGNOSIS — I480 Paroxysmal atrial fibrillation: Secondary | ICD-10-CM | POA: Diagnosis not present

## 2019-07-13 DIAGNOSIS — M1990 Unspecified osteoarthritis, unspecified site: Secondary | ICD-10-CM | POA: Diagnosis not present

## 2019-07-13 DIAGNOSIS — D6862 Lupus anticoagulant syndrome: Secondary | ICD-10-CM | POA: Diagnosis not present

## 2019-07-13 DIAGNOSIS — E1136 Type 2 diabetes mellitus with diabetic cataract: Secondary | ICD-10-CM | POA: Diagnosis not present

## 2019-07-13 DIAGNOSIS — E039 Hypothyroidism, unspecified: Secondary | ICD-10-CM | POA: Diagnosis not present

## 2019-07-13 DIAGNOSIS — E1122 Type 2 diabetes mellitus with diabetic chronic kidney disease: Secondary | ICD-10-CM | POA: Diagnosis not present

## 2019-07-13 DIAGNOSIS — I129 Hypertensive chronic kidney disease with stage 1 through stage 4 chronic kidney disease, or unspecified chronic kidney disease: Secondary | ICD-10-CM | POA: Diagnosis not present

## 2019-07-13 DIAGNOSIS — J45909 Unspecified asthma, uncomplicated: Secondary | ICD-10-CM | POA: Diagnosis not present

## 2019-07-13 DIAGNOSIS — R0602 Shortness of breath: Secondary | ICD-10-CM | POA: Diagnosis not present

## 2019-07-13 DIAGNOSIS — Z7901 Long term (current) use of anticoagulants: Secondary | ICD-10-CM | POA: Diagnosis not present

## 2019-07-13 DIAGNOSIS — N183 Chronic kidney disease, stage 3 unspecified: Secondary | ICD-10-CM | POA: Diagnosis not present

## 2019-07-13 LAB — GLUCOSE, CAPILLARY
Glucose-Capillary: 131 mg/dL — ABNORMAL HIGH (ref 70–99)
Glucose-Capillary: 114 mg/dL — ABNORMAL HIGH (ref 70–99)

## 2019-07-13 MED ORDER — TECHNETIUM TO 99M ALBUMIN AGGREGATED
1.5000 | Freq: Once | INTRAVENOUS | Status: AC | PRN
  Administered 2019-07-13: 1.5 via INTRAVENOUS

## 2019-07-13 NOTE — Progress Notes (Signed)
[facility] Progress Note    Since last GI note: Admitted after complicated EGD yesterday with post procedure chest pains.  THe chest pains have completely resolved. She does have some mild left and right sided upper abdominal achiness.  No nausea or vomiting.  Has not moved her bowels since the procedure yesterday.  Objective: Vital signs in last 24 hours: Temp:  [97.4 F (36.3 C)-98.6 F (37 C)] 97.7 F (36.5 C) (10/22 0409) Pulse Rate:  [62-87] 66 (10/22 0409) Resp:  [10-24] 14 (10/22 0409) BP: (94-140)/(64-76) 101/64 (10/22 0409) SpO2:  [90 %-100 %] 94 % (10/22 0409) Weight:  [88 kg-89.8 kg] 89.8 kg (10/21 1649) Last BM Date: 07/11/19 General: alert and oriented times 3 Heart: regular rate and rythm Abdomen: soft, non-tender, non-distended, normal bowel sounds   Lab Results: Recent Labs    07/12/19 1353  WBC 7.7  HGB 10.4*  PLT 190  MCV 82.5   Recent Labs    07/12/19 1353  NA 137  K 3.6  CL 103  CO2 23  GLUCOSE 165*  BUN 12  CREATININE 1.24*  CALCIUM 8.8*   Recent Labs    07/12/19 1353  PROT 5.9*  ALBUMIN 3.3*  AST 28  ALT 26  ALKPHOS 51  BILITOT 1.0    Medications: Scheduled Meds: . diltiazem  360 mg Oral QHS  . insulin aspart  0-9 Units Subcutaneous TID WC  . irbesartan  150 mg Oral Daily  . levothyroxine  100 mcg Oral QAC breakfast  . metoprolol succinate  25 mg Oral Daily  . pantoprazole  40 mg Oral BID  . triamterene-hydrochlorothiazide  1 tablet Oral Daily   Continuous Infusions: PRN Meds:.acetaminophen, alum & mag hydroxide-simeth, dicyclomine, fentaNYL (SUBLIMAZE) injection, ondansetron (ZOFRAN) IV    Assessment/Plan: 63 y.o. female admitted for observation following EGD, post procedure chest pains.  The chest pains are completely resolved.  She does have some mild upper abd discomfort that may be related to epinephrine injections into her stomach during the EGD.  Certainly safe for d/c this morning. SHe should resume  her lovenox and coumadin tonight as was the plan laid out prior to the procedure.  Please call or page with any further questions or concerns.   Milus Banister, MD  07/13/2019, 7:52 AM [facility] Pager [phone]

## 2019-07-13 NOTE — Progress Notes (Signed)
Patient was discharged home by MD order; discharged instructions review and give to patient with care notes; IV DIC; skin intact; patient will be escorted to the car by a volunteer via wheelchair.  

## 2019-07-13 NOTE — Discharge Summary (Signed)
Physician Discharge Summary  Erin Kirk [id number] DOB: 1956/04/05 DOA: 07/12/2019  PCP: Marin Olp, MD  Admit date: 07/12/2019 Discharge date: 07/13/2019  Admitted From: home Discharge disposition: home   Recommendations for Outpatient Follow-Up:   PPI per GI Resume lovenox and coumadin on 10/22 PM-- outpatient follow up for INR  Discharge Diagnosis:   Principal Problem:   Chest pain Active Problems:   Hypothyroidism   Primary hypercoagulable state ([place])   PAROXYSMAL ATRIAL FIBRILLATION   GERD   Diabetes mellitus type 2 in obese ([place])   Encounter for therapeutic drug monitoring   CKD (chronic kidney disease), stage III   Long term (current) use of anticoagulants - warfarin   Gastric adenoma   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  Diet recommendation: Low sodium, heart healthy.  Carbohydrate-modified  Wound care: None.  Code status: Full.   History of Present Illness:  Erin Kirk is a 63 y.o. female with medical history significant of hypertension, diabetes mellitus type 2, hypothyroidism, VTE/PE, lupus anticoagulant on chronic anticoagulation, gastric adenomas, and neuroendocrine tumor-polyp, anxiety, asthma, and GERD.  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 Carlean Purl in July.  Her chromogranin was noted to be greater than 2000.  Evaluation did not show signs of metastatic disease.  Patient had been on Lovenox bridge with her last dose being yesterday.  She followed up with Dr. Rush Landmark today to undergo EGD with resection of polyps and neuroendocrine tumor.  During the procedure patient was noted to have significant bleeding.  Bleeding stabilized with the use of placement of clips and administration of epinephrine.  Plan was to start patient on a clear liquid diet, omeprazole 40 mg twice  daily for 6 to 8 weeks, and possibly restart Lovenox and Coumadin on 10/22.  Following the procedure patient was noted to have complaints of substernal chest pain that she rated as 8 out of 10.  She described as mildly pleuritic in nature.  Labs revealed hemoglobin 10.4 and initial troponin was negative.  Chest x-ray noted left basilar atelectasis with numerous clips in the left upper quadrant.  Associated symptoms included nausea.  Patient was given a total of 25 mcg of fentanyl, GI cocktail, and Zofran without relief of symptoms.  [facility] called to admit for observation overnight for chest pain.  She rates chest pain as a 6 out of 10 on the pain scale at this time.   Hospital Course by Problem:   63 y.o. female admitted for observation following EGD, post procedure chest pains. The chest pains are completely resolved.  She does have some mild upper abd discomfort that may be related to epinephrine injections into her stomach during the EGD.   - resume her lovenox and coumadin tonight as was the plan laid out prior to the procedure. -CE negative, V/Q normal    Medical Consultants:   GI   Discharge Exam:   Vitals:   07/13/19 0013 07/13/19 0409  BP: 94/65 101/64  Pulse: 62 66  Resp: 14 14  Temp: (!) 97.4 F (36.3 C) 97.7 F (36.5 C)  SpO2: 94% 94%   Vitals:   07/12/19 1649 07/12/19 2047 07/13/19 0013 07/13/19 0409  BP: 134/72 103/67 94/65 101/64  Pulse: 73 74 62 66  Resp: 16 16 14 14   Temp: 98.6 F (37 C) 98.2 F (  36.8 C) (!) 97.4 F (36.3 C) 97.7 F (36.5 C)  TempSrc: Oral Oral Oral Oral  SpO2: 97% 91% 94% 94%  Weight: 89.8 kg     Height: 5\' 4"  (1.626 m)       General exam: Appears calm and comfortable.    The results of significant diagnostics from this hospitalization (including imaging, microbiology, ancillary and laboratory) are listed below for reference.     Procedures and Diagnostic Studies:   No results found.   Labs:   Basic Metabolic Panel: Recent  Labs  Lab 07/12/19 1353  NA 137  K 3.6  CL 103  CO2 23  GLUCOSE 165*  BUN 12  CREATININE 1.24*  CALCIUM 8.8*   GFR Estimated Creatinine Clearance: 49.1 mL/min (A) (by C-G formula based on SCr of 1.24 mg/dL (H)). Liver Function Tests: Recent Labs  Lab 07/12/19 1353  AST 28  ALT 26  ALKPHOS 51  BILITOT 1.0  PROT 5.9*  ALBUMIN 3.3*   No results for input(s): LIPASE, AMYLASE in the last 168 hours. No results for input(s): AMMONIA in the last 168 hours. Coagulation profile No results for input(s): INR, PROTIME in the last 168 hours.  CBC: Recent Labs  Lab 07/12/19 1353  WBC 7.7  HGB 10.4*  HCT 32.5*  MCV 82.5  PLT 190   Cardiac Enzymes: No results for input(s): CKTOTAL, CKMB, CKMBINDEX, TROPONINI in the last 168 hours. BNP: Invalid input(s): POCBNP CBG: Recent Labs  Lab 07/12/19 2214 07/13/19 0758  GLUCAP 157* 131*   D-Dimer No results for input(s): DDIMER in the last 72 hours. Hgb A1c No results for input(s): HGBA1C in the last 72 hours. Lipid Profile No results for input(s): CHOL, HDL, LDLCALC, TRIG, CHOLHDL, LDLDIRECT in the last 72 hours. Thyroid function studies No results for input(s): TSH, T4TOTAL, T3FREE, THYROIDAB in the last 72 hours.  Invalid input(s): FREET3 Anemia work up No results for input(s): VITAMINB12, FOLATE, FERRITIN, TIBC, IRON, RETICCTPCT in the last 72 hours. Microbiology Recent Results (from the past 240 hour(s))  Novel Coronavirus, NAA (hospital order; send-out to ref lab)     Status: None   Collection Time: 07/08/19  9:48 AM   Specimen: Nasopharyngeal Swab; Respiratory  Result Value Ref Range Status   SARS-CoV-2, NAA NOT DETECTED NOT DETECTED Final    Comment: (NOTE) This nucleic acid amplification test was developed and its performance characteristics determined by [employer]. Nucleic acid amplification tests include PCR and TMA. This test has not been FDA cleared or approved. This test has been authorized  by FDA under an Emergency Use Authorization (EUA). This test is only authorized for the duration of time the declaration that circumstances exist justifying the authorization of the emergency use of in vitro diagnostic tests for detection of SARS-CoV-2 virus and/or diagnosis of COVID-19 infection under section 564(b)(1) of the Act, 21 U.S.C. [id number]) (1), unless the authorization is terminated or revoked sooner. When diagnostic testing is negative, the possibility of a false negative result should be considered in the context of a patient's recent exposures and the presence of clinical signs and symptoms consistent with COVID-19. An individual without symptoms of COVID- 19 and who is not shedding SARS-CoV-2 vi rus would expect to have a negative (not detected) result in this assay. Performed At: [facility] [address] [place], [place] [id number] Rush Farmer MD [id number]    [place]  Final    Comment: Performed at [facility], [place] [address], [place], [place]  [address]     Discharge Instructions:   Discharge Instructions    Diet - low sodium heart healthy   Complete by: As directed    Discharge instructions   Complete by: As directed    Omeprazole 40 mg BID for next 6-8 weeks  Can start both lovenox and coumadin tonight INR recheck per your coumadin clinic (Monday at the latest)   Increase activity slowly   Complete by: As directed      Allergies as of 07/13/2019      Reactions   Guaifenesin & Derivatives Nausea And Vomiting   Hallucinations   Iohexol Anaphylaxis, Hives, Shortness Of Breath   Incident 1990, premedicated prior to obtaining IV contrast since.   Amoxicillin-pot Clavulanate Swelling, Other (See Comments)   Facial swelling   Other    Contrast dye      Medication List    TAKE these medications   acetaminophen 500 MG tablet Commonly known as: TYLENOL Take 500 mg by mouth every 6 (six) hours as  needed.   dicyclomine 20 MG tablet Commonly known as: BENTYL Take 1 tablet (20 mg total) by mouth every 6 (six) hours as needed for spasms (abdominal cramps, pain).   diltiazem 360 MG 24 hr capsule Commonly known as: CARDIZEM CD Take 1 capsule by mouth once daily What changed: when to take this   enoxaparin 150 MG/ML injection Commonly known as: Lovenox Inject 1 mL (150 mg total) into the skin daily. What changed: when to take this   levothyroxine 100 MCG tablet Commonly known as: SYNTHROID Take 100 mcg by mouth daily before breakfast. What changed: Another medication with the same name was changed. Make sure you understand how and when to take each.   Euthyrox 100 MCG tablet Generic drug: levothyroxine Take 1 tablet by mouth once daily What changed:   how much to take  when to take this   GAVISCON PO Take 1 tablet by mouth as needed (indigestion).   Iron 325 (65 Fe) MG Tabs Take 1 tablet (325 mg total) by mouth daily. What changed: when to take this   metoprolol succinate 25 MG 24 hr tablet Commonly known as: TOPROL-XL Take 1 tablet (25 mg total) by mouth daily.   omeprazole 40 MG capsule Commonly known as: PriLOSEC Take 1 capsule (40 mg total) by mouth 2 (two) times daily before a meal.   telmisartan 40 MG tablet Commonly known as: MICARDIS Take 1 tablet by mouth once daily What changed: when to take this   triamterene-hydrochlorothiazide 37.5-25 MG tablet Commonly known as: MAXZIDE-25 TAKE 1 TABLET BY MOUTH ONCE DAILY   vitamin B-12 1000 MCG tablet Commonly known as: CYANOCOBALAMIN Take 1,000 mcg by mouth daily.   Vitamin D3 50 MCG (2000 UT) Tabs Take 2,000 Units by mouth daily.   warfarin 5 MG tablet Commonly known as: COUMADIN Take as directed. If you are unsure how to take this medication, talk to your nurse or doctor. Original instructions: TAKE AS DIRECTED BY MOUTH BY  ANTICOAGULATION  CLINIC What changed:   how much to take  how to take  this  when to take this  additional instructions      Follow-up Information    Marin Olp, MD Follow up in 1 week(s).   Specialty: Family Medicine Contact information: [place] [address] [phone]            Time coordinating discharge: 25 min  Signed:  Geradine Girt DO  [facility] 07/13/2019, 11:51 AM

## 2019-07-14 ENCOUNTER — Encounter (HOSPITAL_COMMUNITY): Payer: Self-pay | Admitting: Gastroenterology

## 2019-07-14 LAB — SURGICAL PATHOLOGY

## 2019-07-17 ENCOUNTER — Ambulatory Visit: Payer: Medicare Other | Admitting: Oncology

## 2019-07-18 ENCOUNTER — Encounter: Payer: Self-pay | Admitting: Gastroenterology

## 2019-07-19 ENCOUNTER — Ambulatory Visit (INDEPENDENT_AMBULATORY_CARE_PROVIDER_SITE_OTHER): Payer: Medicare Other | Admitting: General Practice

## 2019-07-19 ENCOUNTER — Other Ambulatory Visit: Payer: Self-pay

## 2019-07-19 ENCOUNTER — Telehealth: Payer: Self-pay | Admitting: Family Medicine

## 2019-07-19 DIAGNOSIS — D6859 Other primary thrombophilia: Secondary | ICD-10-CM

## 2019-07-19 DIAGNOSIS — Z7901 Long term (current) use of anticoagulants: Secondary | ICD-10-CM

## 2019-07-19 LAB — POCT INR: INR: 1.9 — AB (ref 2.0–3.0)

## 2019-07-19 NOTE — Telephone Encounter (Signed)
Ok for it to be virtual.  Looks like she is scheduled for a telephone visit on 07/20/2019.

## 2019-07-19 NOTE — Patient Instructions (Addendum)
Pre visit review using our clinic review tool, if applicable. No additional management support is needed unless otherwise documented below in the visit note.  Take 1 tablet today, 1 1/2 tablets tomorrow and 1 tablet on Friday.  On Saturday resume taking  1 tablet daily except 1/2 tablet on Wednesdays and Fridays.  Re-check in 2 weeks.

## 2019-07-19 NOTE — Progress Notes (Signed)
I have reviewed and agree with note, evaluation, plan.   Stephen Hunter, MD  

## 2019-07-19 NOTE — Telephone Encounter (Signed)
When pt came in for her appt she said that she had surgery recently and they told her she needed to follow up with her PCP and she wanted to know if it should be a virtual or in office

## 2019-07-20 ENCOUNTER — Encounter: Payer: Self-pay | Admitting: Family Medicine

## 2019-07-20 ENCOUNTER — Ambulatory Visit (INDEPENDENT_AMBULATORY_CARE_PROVIDER_SITE_OTHER): Payer: Medicare Other | Admitting: Family Medicine

## 2019-07-20 ENCOUNTER — Telehealth: Payer: Self-pay

## 2019-07-20 VITALS — BP 116/75 | HR 98 | Ht 64.0 in | Wt 194.0 lb

## 2019-07-20 DIAGNOSIS — E669 Obesity, unspecified: Secondary | ICD-10-CM

## 2019-07-20 DIAGNOSIS — R0789 Other chest pain: Secondary | ICD-10-CM

## 2019-07-20 DIAGNOSIS — E1169 Type 2 diabetes mellitus with other specified complication: Secondary | ICD-10-CM | POA: Diagnosis not present

## 2019-07-20 DIAGNOSIS — E559 Vitamin D deficiency, unspecified: Secondary | ICD-10-CM | POA: Diagnosis not present

## 2019-07-20 DIAGNOSIS — E785 Hyperlipidemia, unspecified: Secondary | ICD-10-CM

## 2019-07-20 NOTE — Assessment & Plan Note (Signed)
S: Patient with history of low vitamin D-has been treated long-term with 50,000 units weekly under Dr. Arnoldo Morale A/P: Patient reached out to [place] for refill in August of last vitamin D levels on high-dose or over 2 years ago so we opted to get a vitamin D level-she will come by to get this completed.  She is taking 2000 units/day for now and encouraged her to continue this current regimen -Likely only start high-dose vitamin D if levels are low-I suspect she can maintain adequate vitamin D without high-dose

## 2019-07-20 NOTE — Addendum Note (Signed)
Addended by: Marin Olp on: 07/20/2019 12:50 PM   Modules accepted: Orders, Level of Service

## 2019-07-20 NOTE — Patient Instructions (Signed)
Health Maintenance Due  Topic Date Due  . OPHTHALMOLOGY EXAM  12/02/2016  . DEXA SCAN  02/18/2019  . PNA vac Low Risk Adult (1 of 2 - PCV13) 02/18/2019  . FOOT EXAM  03/19/2019  . MAMMOGRAM  06/02/2019

## 2019-07-20 NOTE — Telephone Encounter (Signed)
Called patient to assess needs. Patient shared that her husband passed away in 2023-06-27. She has one son, who she describes is supportive. Patient is familiar with the cancer center from husbands treatments. No barriers identified. Pt might  benefit from a spiritual care consult.

## 2019-07-20 NOTE — Assessment & Plan Note (Signed)
#  Hyperlipidemia associated with diabetes-lipids really rather good but statin still suggested for all diabetics-may need to retrial lovastatin 10 mg once a week as previously was erroneously ordered as daily patient was concerned about how this could affect her diltiazem-actually diltiazem increases the levels of lovastatin so low-dose infrequent dosing would be reasonable -Patient states with upcoming cancer treatment she would decline statin for now-likely readdress next year

## 2019-07-20 NOTE — Progress Notes (Addendum)
Phone [phone]  Subjective:  Virtual visit via phonenote Chief Complaint  Patient presents with  . Hospitalization Follow-up     This visit type was conducted due to national recommendations for restrictions regarding the COVID-19 Pandemic (e.g. social distancing).  This format is felt to be most appropriate for this patient at this time balancing risks to patient and risks to population by having him in for in person visit.  All issues noted in this document were discussed and addressed.  No physical exam was performed (except for noted visual exam or audio findings with Telehealth visits).  The patient has consented to conduct a Telehealth visit and understands insurance will be billed.   Our team/I connected with Erin Kirk at 11:40 AM EDT by phone (patient did not have equipment for webex) and verified that I am speaking with the correct person using two identifiers.  Location patient: [facility] Location provider: [facility], office Persons participating in the virtual visit:  patient  Time on phone: 21 minutes Counseling provided about need for follow-up blood work.  Discussion of loss of husband  Our team/I discussed the limitations of evaluation and management by telemedicine and the availability of in person appointments. In light of current covid-19 pandemic, patient also understands that we are trying to protect them by minimizing in office contact if at all possible.  The patient expressed consent for telemedicine visit and agreed to proceed. Patient understands insurance will be billed.   ROS-chest pain has resolved.  Some fatigue.  No fever chills reported.  Past Medical History-  Patient Active Problem List   Diagnosis Date Noted  . Low vitamin B12 level 09/07/2016    Priority: High  . Disability examination 01/17/2015    Priority: High  . Diabetes mellitus type 2 in obese ([place]) 11/28/2010    Priority: High  . PAROXYSMAL ATRIAL FIBRILLATION 07/02/2010   Priority: High  . Primary hypercoagulable state ([place]) 11/30/2007    Priority: High  . Hyperlipidemia associated with type 2 diabetes mellitus ([place]) 03/09/2019    Priority: Medium  . Neuropathy due to medical condition ([place]) 11/09/2017    Priority: Medium  . Hx of adenomatous polyp of colon 04/10/2016    Priority: Medium  . CKD (chronic kidney disease), stage III 09/05/2014    Priority: Medium  . Hypothyroidism 03/09/2007    Priority: Medium  . Hypertension associated with diabetes ([place]) 03/09/2007    Priority: Medium  . History of Melanoma 03/09/2007    Priority: Medium  . Encounter for therapeutic drug monitoring 10/26/2013    Priority: Low  . Left facial pain 09/24/2012    Priority: Low  . Oral mucosal lesion 09/24/2012    Priority: Low  . TRANSIENT DISORDER INITIATING/MAINTAINING SLEEP 04/02/2010    Priority: Low  . Overweight(278.02) 11/29/2009    Priority: Low  . LOW BACK PAIN 06/15/2008    Priority: Low  . ALLERGIC RHINITIS 03/09/2007    Priority: Low  . GERD 03/09/2007    Priority: Low  . Chest pain 07/12/2019  . Neuroendocrine carcinoma of stomach ([place]) 07/12/2019  . Abnormal findings on esophagogastroduodenoscopy (EGD) 07/02/2019  . Elevated Chromogranin 07/02/2019  . 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 Multiple gastric polyps 07/02/2019  . 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 Iron deficiency anemia 03/30/2019  . Cough variant asthma 10/14/2018  . Morbid obesity ([place]) 03/18/2018  . Long term (current) use of anticoagulants - warfarin 06/02/2017    Medications- reviewed and updated Current  Outpatient Medications  Medication Sig Dispense Refill  . acetaminophen (TYLENOL) 500 MG tablet Take 500 mg by mouth every 6 (six) hours as needed.    . Cholecalciferol (VITAMIN D3) 50 MCG (2000 UT) TABS Take 2,000 Units by mouth daily.    Marland Kitchen dicyclomine (BENTYL) 20 MG tablet Take 1 tablet (20 mg total) by mouth every 6 (six) hours as  needed for spasms (abdominal cramps, pain). 60 tablet 0  . diltiazem (CARDIZEM CD) 360 MG 24 hr capsule Take 1 capsule by mouth once daily (Patient taking differently: Take 360 mg by mouth at bedtime. ) 90 capsule 0  . EUTHYROX 100 MCG tablet Take 1 tablet by mouth once daily (Patient taking differently: Take 100 mcg by mouth daily before breakfast. ) 90 tablet 1  . levothyroxine (SYNTHROID) 100 MCG tablet Take 100 mcg by mouth daily before breakfast.    . metoprolol succinate (TOPROL-XL) 25 MG 24 hr tablet Take 1 tablet (25 mg total) by mouth daily. 90 tablet 1  . omeprazole (PRILOSEC) 40 MG capsule Take 1 capsule (40 mg total) by mouth 2 (two) times daily before a meal. 60 capsule 4  . telmisartan (MICARDIS) 40 MG tablet Take 1 tablet by mouth once daily (Patient taking differently: Take 40 mg by mouth at bedtime. ) 90 tablet 0  . triamterene-hydrochlorothiazide (MAXZIDE-25) 37.5-25 MG tablet TAKE 1 TABLET BY MOUTH ONCE DAILY (Patient taking differently: Take 1 tablet by mouth daily. ) 90 tablet 3  . vitamin B-12 (CYANOCOBALAMIN) 1000 MCG tablet Take 1,000 mcg by mouth daily.    Marland Kitchen warfarin (COUMADIN) 5 MG tablet TAKE AS DIRECTED BY MOUTH BY  ANTICOAGULATION  CLINIC (Patient taking differently: Take 5 mg by mouth See admin instructions. Take 2.5 mg on Wed and Friday at 16:00 All the other days take 5 mg) 105 tablet 0  . Ferrous Sulfate (IRON) 325 (65 Fe) MG TABS Take 1 tablet (325 mg total) by mouth daily. (Patient not taking: Reported on 07/20/2019) 30 tablet 0   No current facility-administered medications for this visit.      Objective:  BP 116/75   Pulse 98   Ht 5\' 4"  (1.626 m)   Wt 194 lb (88 kg)   LMP  (LMP Unknown)   SpO2 99%   BMI 33.30 kg/m  self reported vitals  Nonlabored voice, normal speech     Assessment and Plan   #Hospital follow-up for chest pain S: 63 year old female known to have neuroendocrine tumor-was recently having a procedure by EGD to investigate further with  Dr. Stefani Dama Roddy.  She was on a Lovenox bridge at the time.  During time of EGD-several polyps were resected and portions of neuroendocrine tumor were biopsied.  During the procedure patient did have some significant bleeding-clips were placed to stop the bleeding in addition to epinephrine being administered.  Patient was to be started on a clear liquid diet, omeprazole 40 mg twice a day for 6 to 8 weeks and restart Lovenox and Coumadin on the 22nd.  Unfortunately patient ended up having substernal chest pain and required overnight monitoring.  There was a slight pleuritic element to her pain and given prior DVT history she was tested with a VQ scan which was low risk for clots.  She also had troponins cycled which did not show any obvious elevations-cardiac etiology was thought unlikely.  Ultimately they thought her symptoms were likely related to the epinephrine that was used to stop the bleeding.  She was also treated with  fentanyl for pain, GI cocktail and Zofran.  Patient has done well since being home-did have some tenderness in her chest wall which improved by Saturday.  She has an upcoming appointment with Dr. Benay Spice to discuss next steps for the neuroendocrine cancer.  Dr. Stefani Dama Roddy did not think endoscopic removal of findings would have been adequate-she is going to need other interventions.  He also told her that she was likely going to need an iron infusion to be set up.  They did discussed me need more surgery. A/P: Chest pain occurred after EGD in which epinephrine was used-l possibly related to epinephrine use-luckily has been chest pain-free since at least Saturday.  Cardiac enzymes were cycled and no evidence of heart attack.  VQ scan was completed and no evidence of clotting.  Patient was somewhat anemic after bleeding during EGD-discussed with patient would like to get follow-up CBC-she would like to discuss with oncology when she sees them next week -I ordered a CBC, CMP, A1c as  well as lipid panel-hopefully those can be added to any labs that oncology does-if not we will see if these can be done at a visit here  # Diabetes S: was not able to tolerate metformin Lab Results  Component Value Date   HGBA1C 7.1 (H) 03/10/2019   HGBA1C 6.7 (H) 07/19/2018   HGBA1C 6.2 (A) 03/18/2018   A/P: Hopefully controlled on no Rx-we will try to get A1c with labs  #Hyperlipidemia associated with diabetes-lipids really rather good but statin still suggested for all diabetics-may need to retrial lovastatin 10 mg once a week as previously was erroneously ordered as daily patient was concerned about how this could affect her diltiazem-actually diltiazem increases the levels of lovastatin so low-dose infrequent dosing would be reasonable -Patient states with upcoming cancer treatment she would decline statin for now-likely readdress next year   Vitamin D deficiency S: Patient with history of low vitamin D-has been treated long-term with 50,000 units weekly under Dr. Arnoldo Morale A/P: Patient reached out to [place] for refill in August of last vitamin D levels on high-dose or over 2 years ago so we opted to get a vitamin D level-she will come by to get this completed.  She is taking 2000 units/day for now and encouraged her to continue this current regimen -Likely only start high-dose vitamin D if levels are low-I suspect she can maintain adequate vitamin D without high-dose    Recommended follow up: We did not schedule plan follow-up-6 months at the latest will be reasonable-likely sooner if A1c above 7-may not be aggressive about blood sugar control given upcoming potential interventions for neuroendocrine tumor Future Appointments  Date Time Provider [place]  07/24/2019  2:00 PM Ladell Pier, MD [facility] None  08/02/2019 10:00 AM [facility]   Lab/Order associations:   ICD-10-CM   1. Atypical chest pain  R07.89   2. Diabetes mellitus type 2 in obese  (HCC)  E11.69 CBC   E66.9 Comprehensive metabolic panel    Hemoglobin A1c    Lipid panel  3. Hyperlipidemia associated with type 2 diabetes mellitus ([place])  E11.69    E78.5    Return precautions advised.  Garret Reddish, MD

## 2019-07-24 ENCOUNTER — Telehealth: Payer: Self-pay | Admitting: Oncology

## 2019-07-24 ENCOUNTER — Encounter: Payer: Self-pay | Admitting: *Deleted

## 2019-07-24 ENCOUNTER — Inpatient Hospital Stay: Payer: Medicare Other | Attending: Oncology | Admitting: Oncology

## 2019-07-24 ENCOUNTER — Other Ambulatory Visit: Payer: Self-pay

## 2019-07-24 VITALS — BP 142/78 | HR 68 | Temp 98.9°F | Resp 17 | Ht 64.0 in | Wt 190.6 lb

## 2019-07-24 DIAGNOSIS — R109 Unspecified abdominal pain: Secondary | ICD-10-CM | POA: Diagnosis not present

## 2019-07-24 DIAGNOSIS — R630 Anorexia: Secondary | ICD-10-CM

## 2019-07-24 DIAGNOSIS — D509 Iron deficiency anemia, unspecified: Secondary | ICD-10-CM | POA: Insufficient documentation

## 2019-07-24 DIAGNOSIS — D500 Iron deficiency anemia secondary to blood loss (chronic): Secondary | ICD-10-CM | POA: Diagnosis not present

## 2019-07-24 DIAGNOSIS — Z803 Family history of malignant neoplasm of breast: Secondary | ICD-10-CM

## 2019-07-24 DIAGNOSIS — Z8049 Family history of malignant neoplasm of other genital organs: Secondary | ICD-10-CM

## 2019-07-24 DIAGNOSIS — R7989 Other specified abnormal findings of blood chemistry: Secondary | ICD-10-CM

## 2019-07-24 DIAGNOSIS — R634 Abnormal weight loss: Secondary | ICD-10-CM

## 2019-07-24 DIAGNOSIS — Z86711 Personal history of pulmonary embolism: Secondary | ICD-10-CM | POA: Insufficient documentation

## 2019-07-24 DIAGNOSIS — F329 Major depressive disorder, single episode, unspecified: Secondary | ICD-10-CM

## 2019-07-24 DIAGNOSIS — R110 Nausea: Secondary | ICD-10-CM

## 2019-07-24 DIAGNOSIS — D6859 Other primary thrombophilia: Secondary | ICD-10-CM

## 2019-07-24 DIAGNOSIS — R6881 Early satiety: Secondary | ICD-10-CM

## 2019-07-24 DIAGNOSIS — E119 Type 2 diabetes mellitus without complications: Secondary | ICD-10-CM

## 2019-07-24 DIAGNOSIS — I10 Essential (primary) hypertension: Secondary | ICD-10-CM | POA: Insufficient documentation

## 2019-07-24 DIAGNOSIS — Z85820 Personal history of malignant melanoma of skin: Secondary | ICD-10-CM

## 2019-07-24 DIAGNOSIS — E039 Hypothyroidism, unspecified: Secondary | ICD-10-CM | POA: Insufficient documentation

## 2019-07-24 DIAGNOSIS — C7A092 Malignant carcinoid tumor of the stomach: Secondary | ICD-10-CM | POA: Insufficient documentation

## 2019-07-24 DIAGNOSIS — E785 Hyperlipidemia, unspecified: Secondary | ICD-10-CM

## 2019-07-24 NOTE — Progress Notes (Signed)
[facility] Patient Consult   Requesting MD: Quetzally Bataille 63 y.o.  01-Feb-1956    Reason for Consult: Carcinoid tumor   HPI: Ms. Villwock was referred to Dr. Carlean Purl for evaluation of iron deficiency anemia.  An upper endoscopy on 05/05/2019 revealed multiple sessile polyps in the cardia and gastric body.  Biopsies were obtained the largest lesion appeared to be an adenoma at the distal cardia.  This lesion was friable and oozing and was felt to be the source of GI blood loss.  The pathology from multiple colonoscopy the same day revealed 2 polyps at the transverse and ascending colon.  The polyps were removed.  The pathology revealed well differentiated neuroendocrine neoplasm involving multiple gastric polyps.  The colon polyps returned as tubular adenomas..  A chromogranin A level on 05/15/2019 returned elevated at 2013.  A CT of the abdomen pelvis 05/18/2019 revealed no hepatic mass, no stomach or bowel mass.  No pathologically enlarged lymph nodes. She was referred to Dr. Lilia Argue for an EUS on 07/12/2019.  A single 18 mm polyp with bleeding was found in the gastric cardia.  The lesion was resected.  Two 8-12 mm polyps were found of the lesser curvature and 3 polyps were found in the gastric body.  These areas were also resected.  A single lesion was resected from the posterior wall of the stomach.  No lesions were noted in the duodenum.  On EUS the mucosal lesions in the cardia and stomach body did not invade the muscularis propria.  No malignant appearing lymph nodes at the left gastric, gastrohepatic ligament, celiac region, and perigastric region.  The pathology revealed tubular adenomas and hyperplastic polyps at the lesser curvature, carcinoid tumors at the stomach body, posterior wall, and stomach cardia.  Tumor involved the cauterized tissue edge at the stomach cardia.   She is taking iron.  She had abdominal pain prior to and immediately following  the endoscopic procedures, this has improved.  She continues omeprazole.  Past Medical History:  Diagnosis Date  . Allergy   . Anxiety   . Arthritis   . Asthma    07/11/2019- allergies  . Blood transfusion without reported diagnosis   . Cataract   . Clotting disorder ([place])   . Depression   . Diabetes ([place])    not on medication  . GERD (gastroesophageal reflux disease)   . Hypertension   . Hypothyroidism   . Iron deficiency anemia 03/30/2019  . Irregular heart beat    tachycardia  . Lupus anticoagulant with hypercoagulable state ([place])   . Melanoma ([place])    2005  . Pneumonia 2019  . Primary hypercoagulable state ([place])   . 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 Pulmonary embolism ([place])   . Thyroid disease    hypothyroidism   .  G2 P1, 1 miscarriage  .  Vitamin B12 deficiency   .  Melanoma, stage III -right face/lymph nodes-2005  Past Surgical History:  Procedure Laterality Date  . ABDOMINAL HYSTERECTOMY     noncancerous  . BACK SURGERY  1983  . CHOLECYSTECTOMY    . COLONOSCOPY    . ENDOSCOPIC MUCOSAL RESECTION N/A 07/12/2019   Procedure: ENDOSCOPIC MUCOSAL RESECTION;  Surgeon: Rush Landmark Telford Nab., MD;  Location: [facility];  Service: Gastroenterology;  Laterality: N/A;  . ESOPHAGOGASTRODUODENOSCOPY (EGD) WITH PROPOFOL N/A 07/12/2019   Procedure: ESOPHAGOGASTRODUODENOSCOPY (EGD) WITH PROPOFOL;  Surgeon: Rush Landmark Telford Nab., MD;  Location: [facility];  Service: Gastroenterology;  Laterality: N/A;  . EUS N/A 07/12/2019   Procedure: UPPER ENDOSCOPIC ULTRASOUND (EUS) RADIAL;  Surgeon: Rush Landmark Telford Nab., MD;  Location: [facility];  Service: Gastroenterology;  Laterality: N/A;  . fatty tumor breast Right    removed  . HEMOSTASIS CLIP PLACEMENT  07/12/2019   Procedure: HEMOSTASIS CLIP PLACEMENT;  Surgeon: Irving Copas., MD;  Location: [facility];  Service: Gastroenterology;;  . HEMOSTASIS CONTROL   07/12/2019   Procedure: HEMOSTASIS CONTROL;  Surgeon: Irving Copas., MD;  Location: [place];  Service: Gastroenterology;;  . [name]    . KNEE ARTHROSCOPY Left   . Lymp nodes Right 08/2004  . MELANOMA EXCISION Right    face   . OOPHORECTOMY Bilateral   . POLYPECTOMY  07/12/2019   Procedure: POLYPECTOMY;  Surgeon: Mansouraty, Telford Nab., MD;  Location: [place] [facility];  Service: Gastroenterology;;  . removal lymphnodes in right neck    . SUBMUCOSAL LIFTING INJECTION  07/12/2019   Procedure: SUBMUCOSAL LIFTING INJECTION;  Surgeon: Rush Landmark Telford Nab., MD;  Location: [place] [place] [facility];  Service: Gastroenterology;;  . UPPER GASTROINTESTINAL ENDOSCOPY      Medications: Reviewed  Allergies:  Allergies  Allergen Reactions  . Guaifenesin & Derivatives Nausea And Vomiting    Hallucinations  . Iohexol Anaphylaxis, Hives and Shortness Of Breath    Incident 1990, premedicated prior to obtaining IV contrast since.  . Amoxicillin-Pot Clavulanate Swelling and Other (See Comments)    Facial swelling  . Other     Contrast dye    Family history: She believes her mother may have had ovarian cancer, though this was not formally diagnosed as her mother was living in a skilled nursing facility.  A niece had uterine cancer.  To one half sisters had breast cancer.  Social History:   She is a widow.  She lives alone in [place].  She has been disabled since 1995 secondary to the hypercoagulation syndrome.  She does not use cigarettes or alcohol.  She was transfused at the time of spine surgery in 1983.  No risk factor for HIV or hepatitis.  ROS:   Positives include: Nausea and abdominal pain after eating-improved anorexia and 20 pound weight loss, depression following the death of her husband, early satiety  A complete ROS was otherwise negative.  Physical Exam:  Blood pressure (!) 142/78, pulse 68, temperature 98.9 F (37.2 C), temperature source Temporal, resp. rate 17,  height 5\' 4"  (1.626 m), weight 190 lb 9.6 oz (86.5 kg).  HEENT: Neck without mass Lungs: Clear bilaterally Cardiac: Regular rate and rhythm Abdomen: No hepatosplenomegaly, no mass, mild tenderness in the right and left lower abdomen  Vascular: No leg edema Lymph nodes: No cervical, supraclavicular, axillary, or inguinal nodes Neurologic: Alert and oriented, motor exam appears intact in the upper and lower extremities bilaterally Skin: No rash, no evidence for recurrent tumor at the right face scar Musculoskeletal: No spine tenderness   LAB:  CBC  Lab Results  Component Value Date   WBC 7.7 07/12/2019   HGB 10.4 (L) 07/12/2019   HCT 32.5 (L) 07/12/2019   MCV 82.5 07/12/2019   PLT 190 07/12/2019   NEUTROABS 4.3 05/15/2019        CMP  Lab Results  Component Value Date   NA 137 07/12/2019   K 3.6 07/12/2019   CL 103 07/12/2019   CO2 23 07/12/2019   GLUCOSE 165 (H) 07/12/2019   BUN 12 07/12/2019   CREATININE 1.24 (H) 07/12/2019   CALCIUM 8.8 (L)  07/12/2019   PROT 5.9 (L) 07/12/2019   ALBUMIN 3.3 (L) 07/12/2019   AST 28 07/12/2019   ALT 26 07/12/2019   ALKPHOS 51 07/12/2019   BILITOT 1.0 07/12/2019   GFRNONAA 46 (L) 07/12/2019   GFRAA 53 (L) 07/12/2019     Chromogranin A on 05/15/2019: 2013 Imaging: As per HPI    Assessment/Plan:   1. Multiple gastric carcinoid tumors  Upper endoscopy 05/05/2019 with resection of multiple gastric carcinoid tumors  EUS 07/12/2019-resection of multiple gastric carcinoid tumors, positive resection margin at the gastric cardia polypectomy site, tubular adenomas and hyperplastic polyps removed from the lesser curvature  Elevated chromogranin A level  CT abdomen/pelvis 05/18/2019-no evidence of a gastric mass or metastatic disease 2. Iron deficiency anemia secondary bleeding from #1 well maintained on anticoagulation therapy 3. 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 Diabetes 5. Abdominal  pain-potentially related to #1 6.   Hypothyroidism 7.  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  Family history of uterine and breast cancer 9.  Gastric and colonic adenomas   Disposition:   Ms. Careaga has been diagnosed with multiple gastric carcinoid tumors.  There is no clinical or CT evidence of metastatic disease.  She appears to have multiple early stage tumors.  I suspect the abdominal pain she has experienced over the last several months may have been related to the multiple bleeding polyps.  The anorexia and weight loss may be related to depression.  We discussed the natural history of carcinoid tumors and treatment options.  She plans to continue close clinical follow-up with Drs. Gessner and Monsouraty for management of the remaining gastric polyps and GI bleeding.  She will continue iron therapy.  The chromogranin is elevated, potentially in part secondary to omeprazole.  We will check a chromogranin A level which she is on a lower dose of omeprazole.  She will be referred for a Netspot study to look for evidence of remaining tumors in the stomach and metastatic disease.  Ms. Stables will return for an office visit in 1 month.    I referred her to the genetics counselor based on her personal and family history of multiple cancers.  Betsy Coder, MD  07/24/2019, 3:32 PM

## 2019-07-24 NOTE — Telephone Encounter (Signed)
Scheduled appt per 11/2 los - gave patient AVS and calender per los.   

## 2019-07-25 ENCOUNTER — Telehealth: Payer: Self-pay

## 2019-07-25 NOTE — Telephone Encounter (Signed)
Spoke to pt told yes she needs to be fasting for lab work. Nothing to eat or drink after midnight except water, black coffee and un sweet tea. Pt verbalized understanding.

## 2019-07-25 NOTE — Telephone Encounter (Signed)
Copied from [place] [id number]. Topic: General - Other >> Jul 25, 2019  8:56 AM Erin Kirk wrote: Patient would like to know if upcoming labs need to be fasting.

## 2019-07-25 NOTE — Progress Notes (Signed)
Thanks

## 2019-07-27 ENCOUNTER — Other Ambulatory Visit: Payer: Self-pay

## 2019-07-27 ENCOUNTER — Other Ambulatory Visit (INDEPENDENT_AMBULATORY_CARE_PROVIDER_SITE_OTHER): Payer: Medicare Other

## 2019-07-27 DIAGNOSIS — E1169 Type 2 diabetes mellitus with other specified complication: Secondary | ICD-10-CM | POA: Diagnosis not present

## 2019-07-27 DIAGNOSIS — E669 Obesity, unspecified: Secondary | ICD-10-CM | POA: Diagnosis not present

## 2019-07-27 DIAGNOSIS — E559 Vitamin D deficiency, unspecified: Secondary | ICD-10-CM

## 2019-07-27 LAB — COMPREHENSIVE METABOLIC PANEL
ALT: 15 U/L (ref 0–35)
AST: 11 U/L (ref 0–37)
Albumin: 4.4 g/dL (ref 3.5–5.2)
Alkaline Phosphatase: 71 U/L (ref 39–117)
BUN: 15 mg/dL (ref 6–23)
CO2: 30 mEq/L (ref 19–32)
Calcium: 9.1 mg/dL (ref 8.4–10.5)
Chloride: 100 mEq/L (ref 96–112)
Creatinine, Ser: 1.12 mg/dL (ref 0.40–1.20)
GFR: 48.76 mL/min — ABNORMAL LOW (ref >60.00)
Glucose, Bld: 135 mg/dL — ABNORMAL HIGH (ref 70–99)
Potassium: 3.7 mEq/L (ref 3.5–5.1)
Sodium: 139 mEq/L (ref 135–145)
Total Bilirubin: 0.6 mg/dL (ref 0.2–1.2)
Total Protein: 6.7 g/dL (ref 6.0–8.3)

## 2019-07-27 LAB — LIPID PANEL
Cholesterol: 183 mg/dL (ref 0–200)
HDL: 51.5 mg/dL (ref >39.00)
LDL Cholesterol: 102 mg/dL — ABNORMAL HIGH (ref 0–99)
NonHDL: 131.31
Total CHOL/HDL Ratio: 4
Triglycerides: 145 mg/dL (ref 0.0–149.0)
VLDL: 29 mg/dL (ref 0.0–40.0)

## 2019-07-27 LAB — CBC
HCT: 35.8 % — ABNORMAL LOW (ref 36.0–46.0)
Hemoglobin: 11.8 g/dL — ABNORMAL LOW (ref 12.0–15.0)
MCHC: 32.9 g/dL (ref 30.0–36.0)
MCV: 81.4 fl (ref 78.0–100.0)
Platelets: 253 10*3/uL (ref 150.0–400.0)
RBC: 4.39 Mil/uL (ref 3.87–5.11)
RDW: 16.4 % — ABNORMAL HIGH (ref 11.5–15.5)
WBC: 7.4 10*3/uL (ref 4.0–10.5)

## 2019-07-27 LAB — VITAMIN D 25 HYDROXY (VIT D DEFICIENCY, FRACTURES): VITD: 57.12 ng/mL (ref 30.00–100.00)

## 2019-07-27 LAB — HEMOGLOBIN A1C: Hgb A1c MFr Bld: 6.6 % — ABNORMAL HIGH (ref 4.6–6.5)

## 2019-07-28 ENCOUNTER — Telehealth: Payer: Self-pay

## 2019-07-28 NOTE — Telephone Encounter (Signed)
Mrs. Estudillo called to ask questions about the scheduling of her Netspot scan and to clarify what was discussed at initial consult with Dr. Benay Spice.  Netspot scan scheduled 11/20 @ 7 AM. Reviewed appointment notes from initial consult and answered questions about neuroendocrine tumors. Assured her that Drs. Mansouraty, Carlean Purl and Benay Spice will be communicating after scan is complete. WIll follow as needed.

## 2019-07-31 ENCOUNTER — Telehealth: Payer: Self-pay | Admitting: Genetic Counselor

## 2019-07-31 NOTE — Telephone Encounter (Signed)
Called patient regarding upcoming Webex appointment, per patient's request this will be a walk-in visit. °

## 2019-08-01 ENCOUNTER — Encounter: Payer: Self-pay | Admitting: Genetic Counselor

## 2019-08-01 ENCOUNTER — Other Ambulatory Visit: Payer: Self-pay

## 2019-08-01 ENCOUNTER — Telehealth: Payer: Self-pay | Admitting: Family Medicine

## 2019-08-01 ENCOUNTER — Inpatient Hospital Stay: Payer: Medicare Other

## 2019-08-01 ENCOUNTER — Inpatient Hospital Stay (HOSPITAL_BASED_OUTPATIENT_CLINIC_OR_DEPARTMENT_OTHER): Payer: Medicare Other | Admitting: Genetic Counselor

## 2019-08-01 ENCOUNTER — Other Ambulatory Visit: Payer: Self-pay | Admitting: Genetic Counselor

## 2019-08-01 DIAGNOSIS — Z803 Family history of malignant neoplasm of breast: Secondary | ICD-10-CM

## 2019-08-01 DIAGNOSIS — Z8041 Family history of malignant neoplasm of ovary: Secondary | ICD-10-CM | POA: Diagnosis not present

## 2019-08-01 DIAGNOSIS — Z8043 Family history of malignant neoplasm of testis: Secondary | ICD-10-CM

## 2019-08-01 DIAGNOSIS — Z801 Family history of malignant neoplasm of trachea, bronchus and lung: Secondary | ICD-10-CM

## 2019-08-01 DIAGNOSIS — Z85828 Personal history of other malignant neoplasm of skin: Secondary | ICD-10-CM | POA: Diagnosis not present

## 2019-08-01 DIAGNOSIS — Z808 Family history of malignant neoplasm of other organs or systems: Secondary | ICD-10-CM

## 2019-08-01 DIAGNOSIS — C7A8 Other malignant neuroendocrine tumors: Secondary | ICD-10-CM | POA: Diagnosis not present

## 2019-08-01 DIAGNOSIS — Z8049 Family history of malignant neoplasm of other genital organs: Secondary | ICD-10-CM

## 2019-08-01 DIAGNOSIS — C7A092 Malignant carcinoid tumor of the stomach: Secondary | ICD-10-CM

## 2019-08-01 MED ORDER — LOVASTATIN 10 MG PO TABS: 10.0000 mg | ORAL_TABLET | ORAL | 3 refills | Status: DC

## 2019-08-01 NOTE — Telephone Encounter (Signed)
See note  Copied from [place] [phone]. Topic: General - Inquiry >> Aug 01, 2019  9:30 AM Erin Kirk, NT wrote: Reason for CRM: Pt called in and would like to speak with CMA in regards to medication we was supposed to start in June, lovastatin 10mg . Please advise.

## 2019-08-01 NOTE — Telephone Encounter (Signed)
Not sure if you got this yesterday didn't see a reply. Pt states she got lovastatin filled #90 in June and it states on that rx to take daily. However, on her lab results from 03/10/2019 you wrote to take weekly. Pt wants to know which is the right way to take the lovastatin.

## 2019-08-01 NOTE — Telephone Encounter (Signed)
Dr. Kathleen Lime via lab results.

## 2019-08-01 NOTE — Progress Notes (Signed)
REFERRING PROVIDER: Ladell Pier, MD [address] [place],  [place] [address]  PRIMARY PROVIDER:  Marin Olp, MD  PRIMARY REASON FOR VISIT:  1. 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 History of Melanoma   3. Family history of breast cancer   4. Family history of ovarian cancer   5. Family history of uterine cancer   6. Family history of lung cancer   7. Family history of bone cancer   8. Family history of testicular cancer      HISTORY OF PRESENT ILLNESS:   Ms. Kulesza, a 63 y.o. female, was seen for a [facility] cancer genetics consultation at the request of Dr. Benay Spice due to a personal history of melanoma and gastric carcinoid tumors, and a family history of breast, uterine, ovarian, bone, and lung cancers.  Ms. Kuriakose presents to clinic today to discuss the possibility of a hereditary predisposition to cancer, genetic testing, and to further clarify her future cancer risks, as well as potential cancer risks for family members.   In 2005, at the age of 63, Ms. Schleifer was diagnosed with melanoma on the right side of her face.  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   Ms. Decaire is also grieving the recent loss of her husband of 98 years to lung cancer on September 25th.  CANCER HISTORY:  Oncology History   No history exists.     RISK FACTORS:  Menarche was at age 39.  First live birth at age 87.  OCP use for approximately 20 years.  Ovaries intact: no.  Hysterectomy: yes.  Menopausal status: postmenopausal.  HRT use: 0 years. Colonoscopy: yes; less than 10 polyps, per patient. Number of breast biopsies: 1. Any excessive radiation exposure in the past: no  Past Medical History:  Diagnosis Date  . Allergy   . Anxiety   . Arthritis   . Asthma    07/11/2019- allergies  . Blood transfusion without reported diagnosis   . Cataract   . Clotting disorder ([place])   . Depression    . Diabetes ([place])    not on medication  . Family history of bone cancer   . Family history of breast cancer   . Family history of lung cancer   . Family history of ovarian cancer   . Family history of testicular cancer   . Family history of uterine cancer   . GERD (gastroesophageal reflux disease)   . Hypertension   . Hypothyroidism   . Iron deficiency anemia 03/30/2019  . Irregular heart beat    tachycardia  . Lupus anticoagulant with hypercoagulable state ([place])   . Melanoma ([place])    2005  . Pneumonia 2019  . Primary hypercoagulable state ([place])   . 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 Pulmonary embolism ([place])   . Thyroid disease    hypothyroidism    Past Surgical History:  Procedure Laterality Date  . ABDOMINAL HYSTERECTOMY     noncancerous  . BACK SURGERY  1983  . CHOLECYSTECTOMY    . COLONOSCOPY    . ENDOSCOPIC MUCOSAL RESECTION N/A 07/12/2019   Procedure: ENDOSCOPIC MUCOSAL RESECTION;  Surgeon: Rush Landmark Telford Nab., MD;  Location: [facility];  Service: Gastroenterology;  Laterality: N/A;  . ESOPHAGOGASTRODUODENOSCOPY (EGD) WITH PROPOFOL N/A 07/12/2019   Procedure: ESOPHAGOGASTRODUODENOSCOPY (EGD) WITH PROPOFOL;  Surgeon: Rush Landmark Telford Nab., MD;  Location: [facility];  Service: Gastroenterology;  Laterality: N/A;  . EUS N/A 07/12/2019   Procedure: UPPER ENDOSCOPIC ULTRASOUND (EUS) RADIAL;  Surgeon: Rush Landmark Telford Nab., MD;  Location: [facility];  Service: Gastroenterology;  Laterality: N/A;  . fatty tumor breast Right    removed  . HEMOSTASIS CLIP PLACEMENT  07/12/2019   Procedure: HEMOSTASIS CLIP PLACEMENT;  Surgeon: Irving Copas., MD;  Location: [facility];  Service: Gastroenterology;;  . HEMOSTASIS CONTROL  07/12/2019   Procedure: HEMOSTASIS CONTROL;  Surgeon: Irving Copas., MD;  Location: [facility];  Service: Gastroenterology;;  . [name]    . KNEE ARTHROSCOPY Left    . Lymp nodes Right 08/2004  . MELANOMA EXCISION Right    face   . OOPHORECTOMY Bilateral   . POLYPECTOMY  07/12/2019   Procedure: POLYPECTOMY;  Surgeon: Mansouraty, Telford Nab., MD;  Location: [facility];  Service: Gastroenterology;;  . removal lymphnodes in right neck    . SUBMUCOSAL LIFTING INJECTION  07/12/2019   Procedure: SUBMUCOSAL LIFTING INJECTION;  Surgeon: Rush Landmark Telford Nab., MD;  Location: [facility];  Service: Gastroenterology;;  . UPPER GASTROINTESTINAL ENDOSCOPY      Social History   Socioeconomic History  . Marital status: Widowed    Spouse name: Not on file  . Number of children: Not on file  . Years of education: Not on file  . Highest education level: Not on file  Occupational History  . Not on file  Social Needs  . Financial resource strain: Not on file  . Food insecurity    Worry: Not on file    Inability: Not on file  . Transportation needs    Medical: Not on file    Non-medical: Not on file  Tobacco Use  . Smoking status: Never Smoker  . Smokeless tobacco: Never Used  Substance and Sexual Activity  . Alcohol use: No    Alcohol/week: 0.0 standard drinks    Comment: no   . Drug use: No  . Sexual activity: Yes  Lifestyle  . Physical activity    Days per week: Not on file    Minutes per session: Not on file  . Stress: Not on file  Relationships  . Social Herbalist on phone: Not on file    Gets together: Not on file    Attends religious service: Not on file    Active member of club or organization: Not on file    Attends meetings of clubs or organizations: Not on file    Relationship status: Not on file  Other Topics Concern  . Not on file  Social History Narrative   Married 1971. 1 child Iman Reinertsen (by [employer]) lives in [place] with 2 grandchildren girls 8,6 in 2015.       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      FAMILY HISTORY:  We  obtained a detailed, 4-generation family history.  Significant diagnoses are listed below: Family History  Problem Relation Age of Onset  . Coronary artery disease Mother        Mom 39, Dad 57-smoker  . Diabetes Mother   . Ovarian cancer Mother 33       not officially diagnosed  . Hypertension Father   . Diabetes Father   . Mitral valve prolapse Sister   . Diabetes Sister   . Breast cancer Maternal Aunt 84  . Breast cancer Half-Sister 73  . Breast cancer  Half-Sister 60  . Uterine cancer Niece 22  . Breast cancer Niece        diagnosed mid-60s  . Lung cancer Nephew   . Bone cancer Nephew 64  . Cancer Niece 67       unknown type  . Alzheimer's disease Maternal Aunt   . Breast cancer Other        maternal great-aunt, diagnosed in her 40s  . Breast cancer Other        4th degree maternal relative, diagnosed in her 63s  . Colon cancer Neg Hx   . Esophageal cancer Neg Hx   . Pancreatic cancer Neg Hx   . Rectal cancer Neg Hx   . Stomach cancer Neg Hx   . Inflammatory bowel disease Neg Hx   . Liver disease Neg Hx    Ms. Dziuba has one son who is 49 and has had "pre-melanomas", but no diagnosis of cancer.   She has three full sisters Melynda Keller, age 55, Osgood age 71, and Doris age 29), none of whom have had cancer. Her sister Tamela Oddi has a daughter who was diagnosed with uterine cancer at age 88.  Ms. Rhetta Mura also has two maternal half-sisters and one maternal half-brother. One of her half-sisters, Bertram Millard, died from breast cancer at age 24. Ruby had three children with cancer - a daughter who had breast cancer in her mid-43s, a son who had lung cancer, and another son who died from bone cancer at age 19.  Ms. Baumert other half-sister, Sis, died at age 43 and had breast cancer initially diagnosed when she was in her 59s.  Sis had a daughter with an unknown type of cancer diagnosed at age 74.   Ms. Cosey mother died at age 47 from what is believed to be ovarian cancer, although she was not  officially diagnosed. She has one maternal uncle and two maternal aunts.  Her uncle died at age 1 from stomach problems that may have been cancer. One of her aunts died at age 3 and had breast cancer. Ms. Ryant also has a maternal great-aunt who was diagnosed with breast cancer in her 3s and who had a daughter diagnosed with breast cancer in her 36s.   Ms. Stockburger father died at age 64 without a diagnosis of cancer. She has two paternal aunts and one paternal uncle, although she has limited information about these individuals. She believes her paternal grandmother may have had cancer, but this is not confirmed.   Ms. Harsha is unaware of previous family history of genetic testing for hereditary cancer risks. She is unsure what her ancestry is on either her maternal or paternal side. There is no reported Ashkenazi Jewish ancestry. There is no known consanguinity.  GENETIC COUNSELING ASSESSMENT: Ms. Caponigro is a 63 y.o. female with a personal history of melanoma and multiple gastric carcinoid tumors, and a family history of breast, ovarian, and uterine cancer, which is somewhat suggestive of a hereditary cancer syndrome and predisposition to cancer. We, therefore, discussed and recommended the following at today's visit.   DISCUSSION: We discussed that 5 - 10% of cancer is hereditary.  Most cases of hereditary breast cancer are associated with the BRCA1/2 genes.  There are other genes that can be associated with hereditary breast cancer syndromes.  These include ATM, CHEK2, PALB2, etc.  Approximately 10% of gastroenteropancreatic neuroendocrine tumors, such as Ms. Majewski's gastric carcinoid tumors, have a hereditary cause.  However, most of these hereditary causes (e.g. Multiple endocrine neoplasia  type 1 and Von Hippel-Lindau syndrome) are associated with other findings, such as primary hyperparathyroidism, pituitary adenomas, hemangioblastomas, skin findings, etc.  Ms. Mcfall does not currently exhibit these  associated findings.  We discussed that testing and identifying a hereditary cancer syndrome is beneficial for several reasons including knowing about other cancer risks, identifying potential screening and risk-reduction options that may be appropriate, and to understand if other family members could be at risk for cancer and allow them to undergo genetic testing.   We reviewed the characteristics, features and inheritance patterns of hereditary cancer syndromes. We also discussed genetic testing, including the appropriate family members to test, the process of testing, insurance coverage and turn-around-time for results. We discussed the implications of a negative, positive and/or variant of uncertain significant result. We recommended Ms. Gladwin pursue genetic testing for the [facility] CustomNext+RNAinsight gene panel.   The CustomNext-Cancer+RNAinsight panel offered by [facility] includes sequencing and rearrangement analysis for up to 91 genes, which will include the following 52 genes for Ms. Hottel:  APC*, ATM*, AXIN2, BAP1, BARD1, BMPR1A, BRCA1*, BRCA2*, BRIP1*, CDH1*, CDK4, CDKN1B, CDKN2A, CHEK2*, DICER1, EPCAM, GREM1, HOXB13, MEN1, MITF, MLH1*, MSH2*, MSH3, MSH6*, MUTYH*, NBN, NF1*, NF2, NTHL1, PALB2*, PMS2*, POLD1, POLE, POT1, PTEN*, RAD51C*, RAD51D*, RB1, RECQL, RET, SDHA, SDHAF2, SDHB, SDHC, SDHD, SMAD4, SMARCA4, STK11, TP53*, TSC1, TSC2, and VHL.  DNA and RNA analyses performed for * genes.   Based on Ms. Neeb's personal and family history of cancer, she meets medical criteria for genetic testing. Despite that she meets criteria, she may still have an out of pocket cost. We discussed that if her out of pocket cost for testing is over $100, the laboratory will call and confirm whether she wants to proceed with testing.  If the out of pocket cost of testing is less than $100 she will be billed by the genetic testing laboratory.   PLAN: After considering the risks, benefits, and limitations, Ms.  Molock provided informed consent to pursue genetic testing and the blood sample was sent to [facility] for analysis of the CustomNext-Cancer+RNAinsight panel. Results should be available within approximately two-three weeks' time, at which point they will be disclosed by telephone to Ms. Laden, as will any additional recommendations warranted by these results. Ms. Domino will receive a summary of her genetic counseling visit and a copy of her results once available. This information will also be available in Epic.   Ms. Ferch questions were answered to her satisfaction today. Our contact information was provided should additional questions or concerns arise. Thank you for the referral and allowing [place] to share in the care of your patient.   Clint Guy, MS, [facility] Certified Genetic Counselor [email or web address] Phone: [phone]  The patient was seen for a total of 60 minutes in face-to-face genetic counseling.  This patient was discussed with Drs. Magrinat, Lindi Adie and/or Burr Medico who agrees with the above.    _______________________________________________________________________ For Office Staff:  Number of people involved in session: 1 Was an Intern/ student involved with case: no

## 2019-08-02 ENCOUNTER — Ambulatory Visit (INDEPENDENT_AMBULATORY_CARE_PROVIDER_SITE_OTHER): Payer: Medicare Other | Admitting: General Practice

## 2019-08-02 DIAGNOSIS — Z7901 Long term (current) use of anticoagulants: Secondary | ICD-10-CM | POA: Diagnosis not present

## 2019-08-02 LAB — POCT INR: INR: 3.5 — AB (ref 2.0–3.0)

## 2019-08-02 NOTE — Progress Notes (Signed)
I have reviewed and agree with note, evaluation, plan.   Stephen Hunter, MD  

## 2019-08-02 NOTE — Patient Instructions (Addendum)
Pre visit review using our clinic review tool, if applicable. No additional management support is needed unless otherwise documented below in the visit note.  Change dosage and start taking 1 tablet daily except 1/2 tablet on Mon Wed Fri.  Re-check in 4 weeks.

## 2019-08-03 ENCOUNTER — Other Ambulatory Visit: Payer: Self-pay | Admitting: Family Medicine

## 2019-08-11 ENCOUNTER — Ambulatory Visit (HOSPITAL_COMMUNITY)
Admission: RE | Admit: 2019-08-11 | Discharge: 2019-08-11 | Disposition: A | Discharge status: Home / Self Care | Payer: Medicare Other | Source: Ambulatory Visit | Attending: Oncology | Admitting: Oncology

## 2019-08-11 ENCOUNTER — Telehealth: Payer: Self-pay | Admitting: *Deleted

## 2019-08-11 ENCOUNTER — Other Ambulatory Visit: Payer: Self-pay

## 2019-08-11 DIAGNOSIS — C7A092 Malignant carcinoid tumor of the stomach: Secondary | ICD-10-CM | POA: Diagnosis not present

## 2019-08-11 MED ORDER — GALLIUM GA 68 DOTATATE IV KIT
4.6000 | PACK | Freq: Once | INTRAVENOUS | Status: AC | PRN
  Administered 2019-08-11: 07:00:00 4.6 via INTRAVENOUS

## 2019-08-11 NOTE — Telephone Encounter (Signed)
-----   Message from Ladell Pier, MD sent at 08/11/2019  3:51 PM EST ----- Please call patient, PET scan shows tiny area of uptake in pancreas- may be a small neuroendocrine tumor, no other evidence of disease, f/u as scheduled, plan to observe for now

## 2019-08-11 NOTE — Telephone Encounter (Signed)
Notified of message below

## 2019-08-14 ENCOUNTER — Telehealth: Payer: Self-pay | Admitting: Genetic Counselor

## 2019-08-14 NOTE — Telephone Encounter (Signed)
LVM that her genetic test results are available and requested that she call back to discuss them.  

## 2019-08-15 ENCOUNTER — Encounter: Payer: Self-pay | Admitting: Genetic Counselor

## 2019-08-15 DIAGNOSIS — Z1379 Encounter for other screening for genetic and chromosomal anomalies: Secondary | ICD-10-CM | POA: Insufficient documentation

## 2019-08-15 NOTE — Telephone Encounter (Signed)
Revealed negative genetic testing.  Discussed that we do not know why she has had multiple carcinoid tumors or why there is cancer in the family.  It could be due to a different gene that we are not testing, or our current technology may not be able detect something.  It will be a good idea for her to keep in contact with genetics to keep up with whether additional testing may be appropriate in the future.

## 2019-08-16 ENCOUNTER — Ambulatory Visit: Payer: Self-pay | Admitting: Genetic Counselor

## 2019-08-16 DIAGNOSIS — Z1379 Encounter for other screening for genetic and chromosomal anomalies: Secondary | ICD-10-CM

## 2019-08-16 NOTE — Progress Notes (Signed)
HPI:  Erin Kirk was previously seen in the [facility] clinic due to a personal and family history of cancer and concerns regarding a hereditary predisposition to cancer. Please refer to our prior cancer genetics clinic note for more information regarding our discussion, assessment and recommendations, at the time. Erin Kirk recent genetic test results were disclosed to her, as were recommendations warranted by these results. These results and recommendations are discussed in more detail below.  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 The report date is 08/12/2019.  The CustomNext-Cancer+RNAinsight panel offered by [facility] includes sequencing and rearrangement analysis for up to 91 genes, which included the following 52 genes for Erin Kirk:  APC*, ATM*, AXIN2, BAP1, BARD1, BMPR1A, BRCA1*, BRCA2*, BRIP1*, CDH1*, CDK4, CDKN1B, CDKN2A, CHEK2*, DICER1, EPCAM, GREM1, HOXB13, MEN1, MITF, MLH1*, MSH2*, MSH3, MSH6*, MUTYH*, NBN, NF1*, NF2, NTHL1, PALB2*, PMS2*, POLD1, POLE, POT1, PTEN*, RAD51C*, RAD51D*, RB1, RECQL, RET, SDHA, SDHAF2, SDHB, SDHC, SDHD, SMAD4, SMARCA4, STK11, TP53*, TSC1, TSC2, and VHL.  DNA and RNA analyses performed for * genes.      FAMILY HISTORY:  We obtained a detailed, 4-generation family history.  Significant diagnoses are listed below: Family History  Problem Relation Age of Onset   Coronary artery disease Mother        Mom 93, Dad 57-smoker   Diabetes Mother    Ovarian cancer Mother 10       not officially diagnosed   Hypertension Father    Diabetes Father    Mitral valve prolapse Sister    Diabetes Sister    Breast cancer Maternal Aunt 74   Breast  cancer Half-Sister 43   Breast cancer Half-Sister 70   Uterine cancer Niece 48   Breast cancer Niece        diagnosed mid-60s   Lung cancer Nephew    Bone cancer Nephew 25   Cancer Niece 32       unknown type   Alzheimer's disease Maternal Aunt    Breast cancer Other        maternal great-aunt, diagnosed in her 69s   Breast cancer Other        4th degree maternal relative, diagnosed in her 26s   Colon cancer Neg Hx    Esophageal cancer Neg Hx    Pancreatic cancer Neg Hx    Rectal cancer Neg Hx    Stomach cancer Neg Hx    Inflammatory bowel disease Neg Hx    Liver disease Neg Hx    Erin Kirk has one son who is 61 and has had "pre-melanoma", but no diagnosis of cancer.   She has three full sisters Erin Kirk, age 84, Erin Kirk age 34, and Erin Kirk age 47), none of whom have had cancer. Her sister Erin Kirk has a daughter who was diagnosed with uterine cancer at age 74.  Erin Kirk also has two maternal half-sisters and one maternal half-brother. One of her half-sisters, Erin Kirk, died from breast cancer at age 57. Erin Kirk had three children with cancer - a daughter who had breast cancer in her mid-43s, a son who had lung cancer, and another son who died from bone cancer at age 68.  Ms. Novoa other half-sister, Erin Kirk, died at age 54 and had breast  cancer initially diagnosed when she was in her 54s.  Erin Kirk had a daughter with an unknown type of cancer diagnosed at age 37.   Erin Kirk mother died at age 38 from what is believed to be ovarian cancer, although she was not officially diagnosed. She has one maternal uncle and two maternal aunts.  Her uncle died at age 87 from stomach problems that may have been cancer. One of her aunts died at age 76 and had breast cancer. Erin Kirk also has a maternal great-aunt who was diagnosed with breast cancer in her 42s and who had a daughter diagnosed with breast cancer in her 19s.   Erin Kirk father died at age 78 without a diagnosis of cancer. She has two  paternal aunts and one paternal uncle, although she has limited information about these individuals. She believes her paternal grandmother may have had cancer, but this is not confirmed.   Erin Kirk is unaware of previous family history of genetic testing for hereditary cancer risks. She is unsure what her ancestry is on either her maternal or paternal side. There is no reported Ashkenazi Jewish ancestry. There is no known consanguinity.  GENETIC TEST RESULTS: Genetic testing reported out on 08/12/2019 through the [facility] CustomNext-Cancer+RNAinsight panel and detected no pathogenic variants.   The CustomNext-Cancer+RNAinsight panel offered by [facility] F[facility] includes sequencing and rearrangement analysis for up to 91 genes, which included the following 52 genes for Ms. Nan:  APC*, ATM*, AXIN2, BAP1, BARD1, BMPR1A, BRCA1*, BRCA2*, BRIP1*, CDH1*, CDK4, CDKN1B, CDKN2A, CHEK2*, DICER1, EPCAM, GREM1, HOXB13, MEN1, MITF, MLH1*, MSH2*, MSH3, MSH6*, MUTYH*, NBN, NF1*, NF2, NTHL1, PALB2*, PMS2*, POLD1, POLE, POT1, PTEN*, RAD51C*, RAD51D*, RB1, RECQL, RET, SDHA, SDHAF2, SDHB, SDHC, SDHD, SMAD4, SMARCA4, STK11, TP53*, TSC1, TSC2, and VHL.  DNA and RNA analyses performed for * genes. The test report will be scanned into EPIC and located under the Molecular Pathology section of the Results Review tab.  A portion of the result report is included below for reference.     We discussed with Erin Kirk that because current genetic testing is not perfect, it is possible there may be a gene mutation in one of these genes that current testing cannot detect, but that chance is small.  We also discussed, that there could be another gene that has not yet been discovered, or that we have not yet tested, that is responsible for the cancer diagnoses in the family. It is also possible there is a hereditary cause for the cancer in the family that Erin Kirk did not inherit and therefore was not identified in her testing.  Therefore, it  is important to remain in touch with cancer genetics in the future so that we can continue to offer Erin Kirk the most up to date genetic testing.   CANCER SCREENING RECOMMENDATIONS: Erin Kirk test result is considered negative (normal).  This means that we have not identified a hereditary cause for her personal and family history of cancer at this time. Most cancers happen by chance and this negative test suggests that her personal and family of cancer may fall into this category.    While reassuring, this does not definitively rule out a hereditary predisposition to cancer. It is still possible that there could be genetic mutations that are undetectable by current technology. There could be genetic mutations in genes that have not been tested or identified to increase cancer risk.  Therefore, it is recommended she continue to follow the cancer management and screening guidelines  provided by heroncology and primary healthcare provider.   An individual's cancer risk and medical management are not determined by genetic test results alone. Overall cancer risk assessment incorporates additional factors, including personal medical history, family history, and any available genetic information that may result in a personalized plan for cancer prevention and surveillance.  Based on Ms. Neider's family of cancer, as well as her genetic test results, a statistical model Harriett Rush) was used to estimate her risk of developing breast cancer. These estimate her lifetime risk of developing breast cancer to be approximately 12.1%.  The patient's lifetime breast cancer risk is a preliminary estimate based on available information using one of several models endorsed by the [place]  (ACS). The ACS recommends consideration of breast MRI screening as an adjunct to mammography for patients at high risk (defined as 20% or greater lifetime risk). A more detailed breast cancer risk assessment can be  considered, if clinically indicated.      RECOMMENDATIONS FOR FAMILY MEMBERS:  Individuals in this family might be at some increased risk of developing cancer, over the general population risk, simply due to the family history of cancer.  We recommended women in this family have a yearly mammogram beginning at age 87, or 11 years younger than the earliest onset of cancer, an annual clinical breast exam, and perform monthly breast self-exams. Women in this family should also have a gynecological exam as recommended by their primary provider. All family members should have a colonoscopy by age 31.  It is also possible there is a hereditary cause for the cancer in Ms. Knittel's family that she did not inherit and therefore was not identified in her.  Based on Ms. Minarik's family history, we recommended relatives of her mother, who was diagnosed with ovarian cancer at age 34, have genetic counseling and testing. Ms. Olesen will let us know if we can be of any assistance in coordinating genetic counseling and/or testing for this family member.   FOLLOW-UP: Lastly, we discussed with Ms. Neiswender that cancer genetics is a rapidly advancing field and it is possible that new genetic tests will be appropriate for her and/or her family members in the future. We encouraged her to remain in contact with cancer genetics on an annual basis so we can update her personal and family histories and let her know of advances in cancer genetics that may benefit this family.   Our contact number was provided. Ms. Maiden questions were answered to her satisfaction, and she knows she is welcome to call us at anytime with additional questions or concerns.   Clint Guy, MS, [facility] Certified Genetic Counselor [email or web address] Phone: [phone]

## 2019-08-21 ENCOUNTER — Other Ambulatory Visit: Payer: Self-pay

## 2019-08-21 ENCOUNTER — Inpatient Hospital Stay: Payer: Medicare Other | Admitting: Oncology

## 2019-08-21 ENCOUNTER — Telehealth: Payer: Self-pay | Admitting: Oncology

## 2019-08-21 VITALS — BP 111/68 | HR 63 | Temp 98.5°F | Resp 17 | Ht 64.0 in | Wt 190.1 lb

## 2019-08-21 DIAGNOSIS — D509 Iron deficiency anemia, unspecified: Secondary | ICD-10-CM | POA: Diagnosis not present

## 2019-08-21 DIAGNOSIS — I10 Essential (primary) hypertension: Secondary | ICD-10-CM | POA: Diagnosis not present

## 2019-08-21 DIAGNOSIS — C7A092 Malignant carcinoid tumor of the stomach: Secondary | ICD-10-CM | POA: Diagnosis not present

## 2019-08-21 DIAGNOSIS — Z86711 Personal history of pulmonary embolism: Secondary | ICD-10-CM | POA: Diagnosis not present

## 2019-08-21 DIAGNOSIS — E039 Hypothyroidism, unspecified: Secondary | ICD-10-CM | POA: Diagnosis not present

## 2019-08-21 DIAGNOSIS — E119 Type 2 diabetes mellitus without complications: Secondary | ICD-10-CM | POA: Diagnosis not present

## 2019-08-21 NOTE — Progress Notes (Signed)
  [place] OFFICE PROGRESS NOTE   Diagnosis: Gastric carcinoid tumor  INTERVAL HISTORY:   Erin Kirk returns as scheduled.  She feels well.  No diarrhea or flushing.  She is taking iron.  Objective:  Vital signs in last 24 hours:  Blood pressure 111/68, pulse 63, temperature 98.5 F (36.9 C), temperature source Temporal, resp. rate 17, height 5\' 4"  (1.626 m), weight 190 lb 1.6 oz (86.2 kg), SpO2 99 %.   Physical examination not performed today secondary to distancing with the Covid pandemic  Lab Results:  Lab Results  Component Value Date   WBC 7.4 07/27/2019   HGB 11.8 (L) 07/27/2019   HCT 35.8 (L) 07/27/2019   MCV 81.4 07/27/2019   PLT 253.0 07/27/2019   NEUTROABS 4.3 05/15/2019    CMP  Lab Results  Component Value Date   NA 139 07/27/2019   K 3.7 07/27/2019   CL 100 07/27/2019   CO2 30 07/27/2019   GLUCOSE 135 (H) 07/27/2019   BUN 15 07/27/2019   CREATININE 1.12 07/27/2019   CALCIUM 9.1 07/27/2019   PROT 6.7 07/27/2019   ALBUMIN 4.4 07/27/2019   AST 11 07/27/2019   ALT 15 07/27/2019   ALKPHOS 71 07/27/2019   BILITOT 0.6 07/27/2019   GFRNONAA 46 (L) 07/12/2019   GFRAA 53 (L) 07/12/2019     Medications: I have reviewed the patient's current medications.   Assessment/Plan: 1. Multiple gastric carcinoid tumors  Upper endoscopy 05/05/2019 with resection of multiple gastric carcinoid tumors  EUS 07/12/2019-resection of multiple gastric carcinoid tumors, positive resection margin at the gastric cardia polypectomy site, tubular adenomas and hyperplastic polyps removed from the lesser curvature  Elevated chromogranin A level  CT abdomen/pelvis 05/18/2019-no evidence of a gastric mass or metastatic disease  Netspot 08/11/2019-focal activity corresponding to a 2-3 mm lesion in the mid pancreas, diffuse activity in the stomach 2. Iron deficiency anemia secondary bleeding from #1 and maintained on anticoagulation therapy 3. Hypercoagulation  syndrome, antiphospholipid syndrome-maintained on Coumadin 4. Diabetes 5. Abdominal pain-potentially related to #1 6.   Hypothyroidism 7.  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  Family history of uterine and breast cancer 9.  Gastric and colonic adenomas 10.  Hypodense pancreas lesion 05/18/2019 -positive Netspot uptake 08/11/2019     Disposition: Erin Kirk appears well.  She has a history of multiple gastric carcinoid tumors.  The Netspot is positive in the stomach, likely reflecting residual carcinoid tumors or post procedure changes.  There is an isolated lesion in the pancreas and no other evidence of metastatic disease.  The pancreas lesion is most likely a primary tumor.  I reviewed the Netspot images with Erin Kirk.  We discussed treatment options.  Somatostatin analog therapy at present.  She will return for an office visit and chromogranin a level in 3 months.  She will hold Prilosec for 4 days prior to checking the chromogranin A level.  I will present her case at the GI tumor conference to discuss treatment options.  She will continue iron therapy.  We will check a CBC when she returns in 3 months. 25 minutes were spent with the patient today.  The majority of the time was used for counseling and coordination of care.  Betsy Coder, MD  08/21/2019  10:30 AM

## 2019-08-21 NOTE — Telephone Encounter (Signed)
Gave avs and calendar ° °

## 2019-08-23 ENCOUNTER — Telehealth: Payer: Self-pay | Admitting: *Deleted

## 2019-08-23 ENCOUNTER — Telehealth: Payer: Self-pay | Admitting: Gastroenterology

## 2019-08-23 NOTE — Telephone Encounter (Signed)
The patient's case was discussed at multidisciplinary conference this morning. Patient's Netspot dotatate suggested some mild activity within the stomach but nothing consistent with a true focus or epicenter within the stomach.  There was a 2 to 3 mm focus within the pancreas body which on prior CT imaging had not previously been called but upon further evaluation could be suggestive on prior CT scan.  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 After discussion with [facility] was felt that repeat endoscopic evaluation as well as a endoscopic ultrasound of the pancreas and possible sampling of the lesion would be helpful. I agree. I likely would like to repeat EGD/EUS with EMR in approximately 2 months so somewhere in January or February. I will discuss the case with her primary gastroenterologist Dr. Carlean Purl and will decide on timing. I called the patient this morning to talk with her about our discussion at [facility] so that she is aware of upcoming plan for repeat procedure. She will also need a repeat chromogranin which Dr. Carlean Purl and I will determine timing of. The patient is appreciative for all the care that she has received through [facility].  Justice Britain, MD [facility] Office # [phone]

## 2019-08-23 NOTE — Telephone Encounter (Signed)
Your call - ok by me  I was moving btwn meetings and unfortunately missed her

## 2019-08-23 NOTE — Telephone Encounter (Signed)
Notified patient that her case was presented in GI conference today and current plan is observation for now. Dr. Donneta Romberg office will be calling to repeat upper endoscopy in 2-3 months to look at pancreas lesion. She understands and agrees. Says that Dr. Rush Landmark called her today as well.

## 2019-08-24 IMAGING — DX DG CHEST 2V
2 series · 2 of 2 positions shown · non-contrast
Comparison: PA and lateral chest x-ray May 31, 2014

CLINICAL DATA: Productive cough with intermittent episodes of blood
tinged sputum for the past week. History of asthma, pulmonary
embolism, nonsmoker.

EXAM:
CHEST - 2 VIEW

[chest pa]
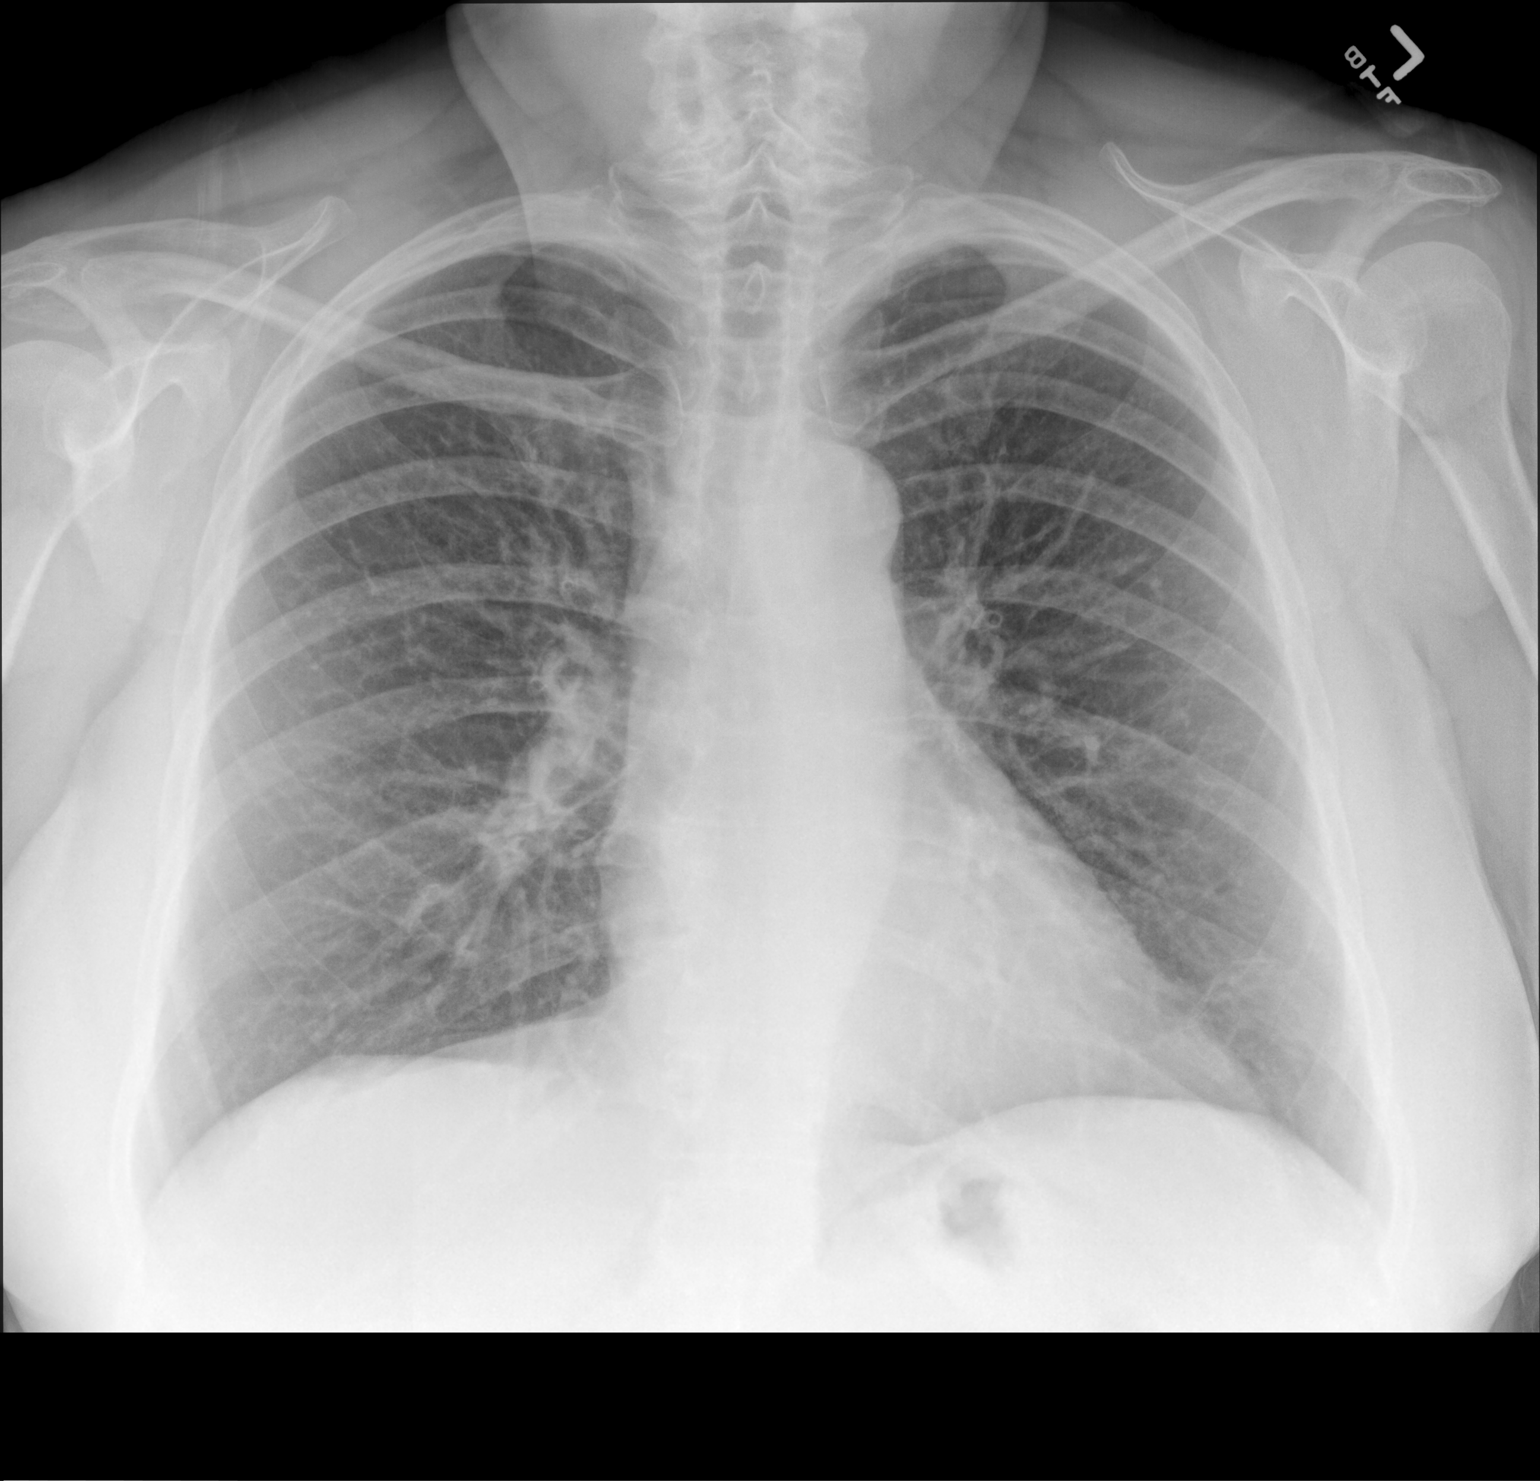

[chest lat]
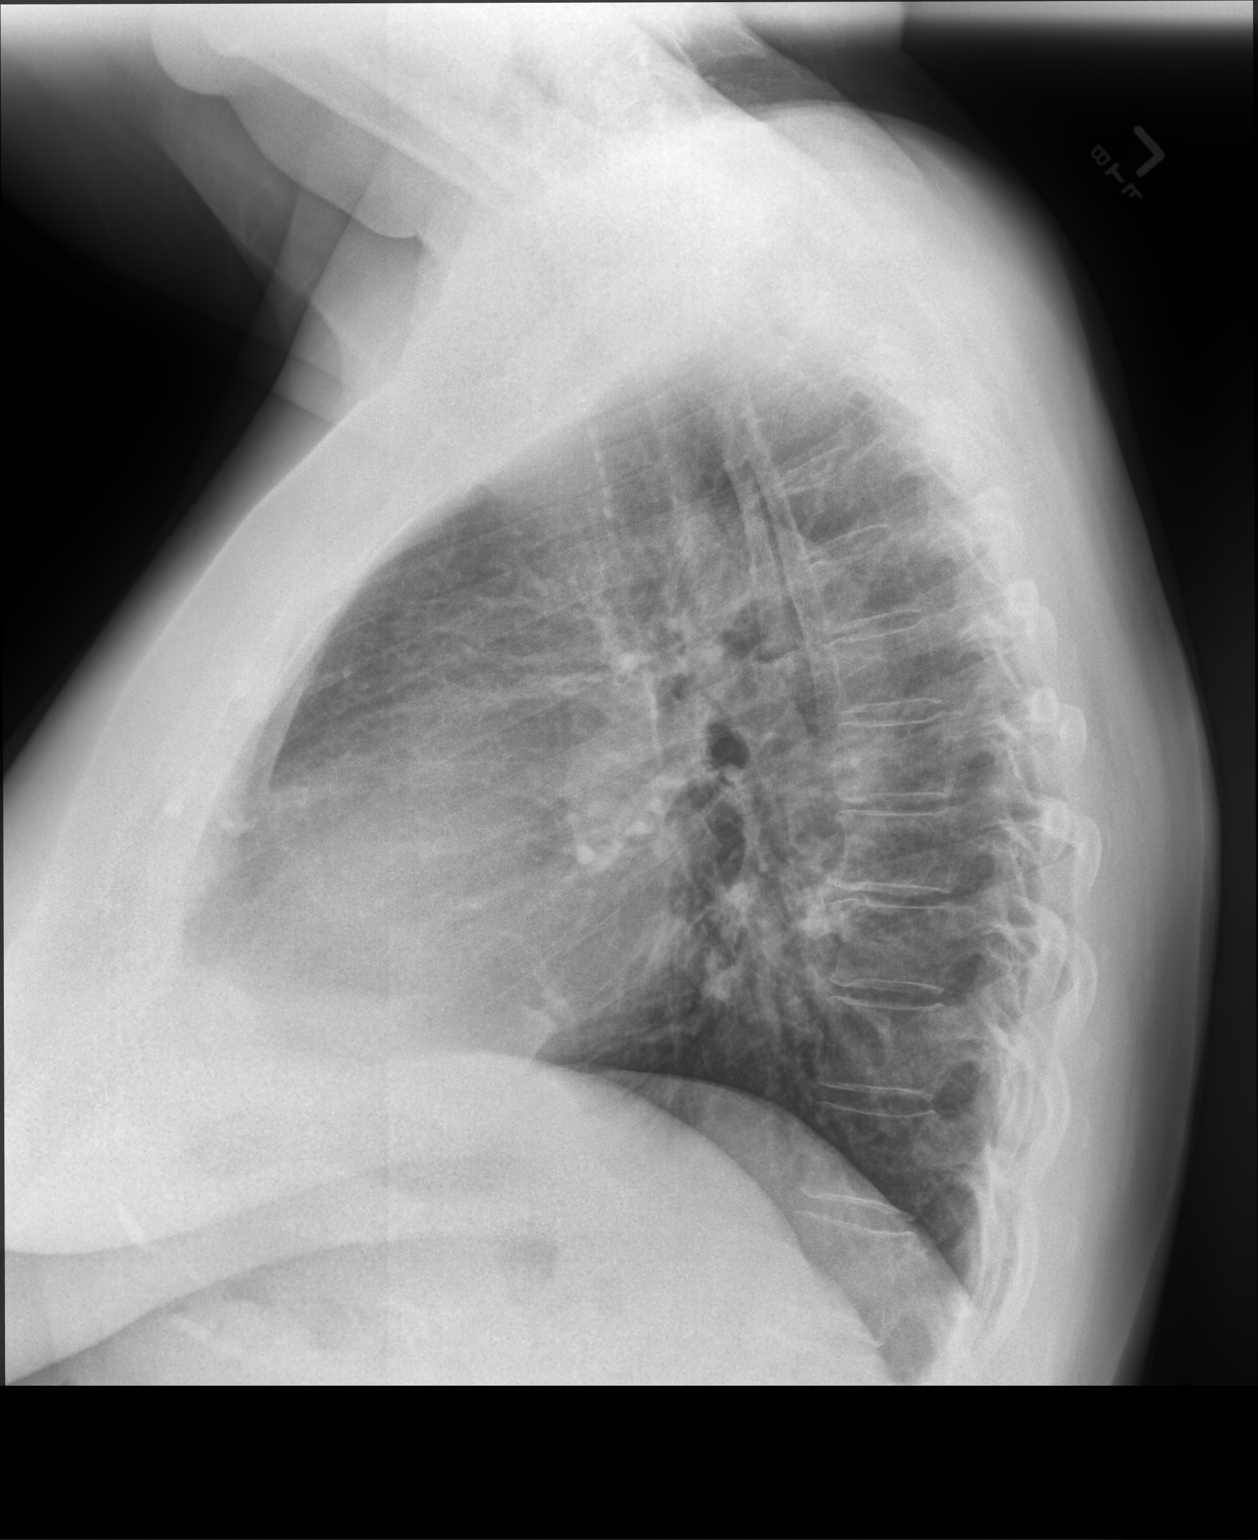

[2 of 2 positions shown; findings below may reference images not displayed]

FINDINGS: The lungs are adequately inflated. The interstitial markings are
increased diffusely. There is no alveolar infiltrate or pleural
effusion. The heart and pulmonary vascularity are normal. The
mediastinum is normal in width. The trachea is midline. The bony
thorax exhibits no acute abnormality.
IMPRESSION: Increased interstitial lung markings bilaterally likely reflects
acute bronchitis or developing interstitial pneumonia though I favor
the former. No alveolar pneumonia or CHF. Followup PA and lateral
chest X-ray is recommended in 3-4 weeks following trial of
antibiotic therapy to ensure resolution.

## 2019-08-24 NOTE — Telephone Encounter (Signed)
Patty or covering RN, This patient needs to undergo an EGD/EUS (linear/radial) in January or February. Please move forward with scheduling and once that date has been established, please let me know. From my recent discussions with the patient on the phone she would be waiting for a call within the next week with the finalize plan at the time for her procedure. In approximately 2 weeks I would like her to try to stop her PPI for 1 week and then come in for a chromogranin a level.  Once she has given the blood sample for chromogranin A she can restart her PPI, so please order that. Thanks. GM

## 2019-08-27 ENCOUNTER — Other Ambulatory Visit: Payer: Self-pay | Admitting: Family Medicine

## 2019-08-29 ENCOUNTER — Other Ambulatory Visit: Payer: Self-pay

## 2019-08-29 DIAGNOSIS — K869 Disease of pancreas, unspecified: Secondary | ICD-10-CM

## 2019-08-29 NOTE — Addendum Note (Signed)
Addended by: Timothy Lasso on: 08/29/2019 11:53 AM   Modules accepted: Orders

## 2019-08-29 NOTE — Telephone Encounter (Signed)
The pt has been scheduled for 10/09/19 for EUS with Dr Rush Landmark.  She has been advised and instructed.  Information has been mailed to the pt home as well. We also discussed the COVID testing information.  She will come in for lab as well.

## 2019-08-29 NOTE — Telephone Encounter (Signed)
Thank you for update. GM 

## 2019-08-30 ENCOUNTER — Other Ambulatory Visit: Payer: Self-pay

## 2019-08-30 ENCOUNTER — Ambulatory Visit (INDEPENDENT_AMBULATORY_CARE_PROVIDER_SITE_OTHER): Payer: Medicare Other | Admitting: General Practice

## 2019-08-30 ENCOUNTER — Other Ambulatory Visit: Payer: Self-pay | Admitting: General Practice

## 2019-08-30 DIAGNOSIS — Z7901 Long term (current) use of anticoagulants: Secondary | ICD-10-CM | POA: Diagnosis not present

## 2019-08-30 LAB — POCT INR: INR: 2.5 (ref 2.0–3.0)

## 2019-08-30 MED ORDER — ENOXAPARIN SODIUM 80 MG/0.8ML ~~LOC~~ SOLN: 80.0000 mg | Freq: Two times a day (BID) | SUBCUTANEOUS | 0 refills | Status: DC

## 2019-08-30 NOTE — Patient Instructions (Addendum)
Pre visit review using our clinic review tool, if applicable. No additional management support is needed unless otherwise documented below in the visit  note.  Change dosage and start taking 1 tablet daily except 1/2 tablet on Mon Wed Fri.  Re-check in 4 weeks.  Please follow patient instructions.  1/13 - Last dose of coumadin until after procedure 1/14 - Nothing (No coumadin or Lovenox) 1/15 - Lovenox in the AM and PM 1/16 - Lovenox in the AM and PM 1/17 - Lovenox in the AM only 1/18 - Procedure - No Lovenox today 1/19 - Lovenox in the AM and PM and 1 1/2 tablets of coumadin 1/20 - Lovenox in the AM and PM and 1 1/2 tablets of coumadin 1/21 - Lovenox in the AM and PM and 1 1/2 tablets of coumadin 1/22 - Lovenox in the AM and PM and 1 tablet of coumadin 1/23 - Stop Lovenox and resume coumadin 5 mg daily except 2.5 mg on Mon Wed and Fridays  1/27 - Re-check INR

## 2019-08-30 NOTE — Progress Notes (Signed)
I have reviewed and agree with note, evaluation, plan.   Jansel Vonstein, MD  

## 2019-08-31 ENCOUNTER — Telehealth: Payer: Self-pay | Admitting: General Practice

## 2019-08-31 DIAGNOSIS — D2262 Melanocytic nevi of left upper limb, including shoulder: Secondary | ICD-10-CM | POA: Diagnosis not present

## 2019-08-31 DIAGNOSIS — C44712 Basal cell carcinoma of skin of right lower limb, including hip: Secondary | ICD-10-CM | POA: Diagnosis not present

## 2019-08-31 DIAGNOSIS — D2362 Other benign neoplasm of skin of left upper limb, including shoulder: Secondary | ICD-10-CM | POA: Diagnosis not present

## 2019-08-31 DIAGNOSIS — I8391 Asymptomatic varicose veins of right lower extremity: Secondary | ICD-10-CM | POA: Diagnosis not present

## 2019-08-31 DIAGNOSIS — Z85820 Personal history of malignant melanoma of skin: Secondary | ICD-10-CM | POA: Diagnosis not present

## 2019-08-31 DIAGNOSIS — Z85828 Personal history of other malignant neoplasm of skin: Secondary | ICD-10-CM | POA: Diagnosis not present

## 2019-08-31 NOTE — Telephone Encounter (Signed)
-----   Message from Marin Olp, MD sent at 08/29/2019  8:18 PM EST ----- Erin Kirk, can you set her up with a lovenox bridge again?  Garret Reddish ----- Message ----- From: Timothy Lasso, RN Sent: 08/29/2019  11:53 AM EST To: Marin Olp, MD

## 2019-09-05 ENCOUNTER — Telehealth: Payer: Self-pay | Admitting: *Deleted

## 2019-09-05 NOTE — Telephone Encounter (Signed)
Dr. Rush Landmark doing Chromogranin A test in January prior to her procedure. Asking if this is same lab Dr. Benay Spice plans in February? Called back and left VM that Dr. Benay Spice was doing that as well as a CBC in February. Will cancel the chromogranin A and keep cbc,

## 2019-09-18 ENCOUNTER — Other Ambulatory Visit: Payer: Self-pay | Admitting: Family Medicine

## 2019-09-20 NOTE — Telephone Encounter (Signed)
The pt has been advised by [name] to hold coumadin and bridge with lovenox.  See the note in the chart per coumadin clinic.

## 2019-09-26 ENCOUNTER — Other Ambulatory Visit (INDEPENDENT_AMBULATORY_CARE_PROVIDER_SITE_OTHER): Payer: Medicare Other

## 2019-09-26 ENCOUNTER — Other Ambulatory Visit: Payer: Self-pay

## 2019-09-26 DIAGNOSIS — K869 Disease of pancreas, unspecified: Secondary | ICD-10-CM

## 2019-09-26 DIAGNOSIS — D649 Anemia, unspecified: Secondary | ICD-10-CM

## 2019-09-26 LAB — CBC WITH DIFFERENTIAL/PLATELET
Basophils Absolute: 0.1 10*3/uL (ref 0.0–0.1)
Basophils Relative: 1.3 % (ref 0.0–3.0)
Eosinophils Absolute: 0.3 10*3/uL (ref 0.0–0.7)
Eosinophils Relative: 4.9 % (ref 0.0–5.0)
HCT: 36.9 % (ref 36.0–46.0)
Hemoglobin: 12.1 g/dL (ref 12.0–15.0)
Lymphocytes Relative: 25.6 % (ref 12.0–46.0)
Lymphs Abs: 1.8 10*3/uL (ref 0.7–4.0)
MCHC: 32.8 g/dL (ref 30.0–36.0)
MCV: 84.5 fl (ref 78.0–100.0)
Monocytes Absolute: 0.5 10*3/uL (ref 0.1–1.0)
Monocytes Relative: 7.5 % (ref 3.0–12.0)
Neutro Abs: 4.2 10*3/uL (ref 1.4–7.7)
Neutrophils Relative %: 60.7 % (ref 43.0–77.0)
Platelets: 225 10*3/uL (ref 150.0–400.0)
RBC: 4.37 Mil/uL (ref 3.87–5.11)
RDW: 14.5 % (ref 11.5–15.5)
WBC: 7 10*3/uL (ref 4.0–10.5)
nRBC: 0 /100 WBC (ref 0–4)

## 2019-09-26 LAB — IBC + FERRITIN
Ferritin: 6.7 ng/mL — ABNORMAL LOW (ref 10.0–291.0)
Iron: 56 ug/dL (ref 42–145)
Saturation Ratios: 11.5 % — ABNORMAL LOW (ref 20.0–50.0)
Transferrin: 348 mg/dL (ref 212.0–360.0)

## 2019-09-26 LAB — VITAMIN B12: Vitamin B-12: >1500 pg/mL — ABNORMAL HIGH (ref 211–911)

## 2019-09-26 LAB — FOLATE: Folate: 11.1 ng/mL (ref >5.9)

## 2019-09-29 LAB — RETICULOCYTES
ABS Retic: 39780 cells/uL (ref 20000–8000)
Retic Ct Pct: 0.9 %

## 2019-09-29 LAB — CHROMOGRANIN A: Chromogranin A: 1294 ng/mL — ABNORMAL HIGH (ref 25–140)

## 2019-10-01 ENCOUNTER — Other Ambulatory Visit: Payer: Self-pay | Admitting: Family Medicine

## 2019-10-02 ENCOUNTER — Telehealth: Payer: Self-pay | Admitting: Gastroenterology

## 2019-10-02 NOTE — Telephone Encounter (Signed)
Mansouraty, Telford Nab., MD  Timothy Lasso, RN   Cc: Gatha Mayer, MD  Harsha Yusko, please let patient know the ground-level has decreased significantly but still remains elevated. This could be a result of her PPI use. We will plan to proceed with her upcoming procedure and see if there is any further tissue that needs to be removed.        The patient has been notified of this information and all questions answered. She will keep appt as planned

## 2019-10-05 ENCOUNTER — Other Ambulatory Visit (HOSPITAL_COMMUNITY)
Admission: RE | Admit: 2019-10-05 | Discharge: 2019-10-05 | Disposition: A | Discharge status: Home / Self Care | Payer: Medicare Other | Source: Ambulatory Visit | Attending: Gastroenterology | Admitting: Gastroenterology

## 2019-10-05 DIAGNOSIS — Z01812 Encounter for preprocedural laboratory examination: Secondary | ICD-10-CM | POA: Insufficient documentation

## 2019-10-05 DIAGNOSIS — Z20822 Contact with and (suspected) exposure to covid-19: Secondary | ICD-10-CM | POA: Diagnosis not present

## 2019-10-06 ENCOUNTER — Other Ambulatory Visit: Payer: Self-pay

## 2019-10-06 ENCOUNTER — Encounter (HOSPITAL_COMMUNITY): Payer: Self-pay | Admitting: Gastroenterology

## 2019-10-06 LAB — NOVEL CORONAVIRUS, NAA (HOSP ORDER, SEND-OUT TO REF LAB; TAT 18-24 HRS): SARS-CoV-2, NAA: NOT DETECTED

## 2019-10-09 ENCOUNTER — Other Ambulatory Visit: Payer: Self-pay

## 2019-10-09 ENCOUNTER — Ambulatory Visit (HOSPITAL_COMMUNITY): Payer: Medicare Other | Admitting: Anesthesiology

## 2019-10-09 ENCOUNTER — Encounter (HOSPITAL_COMMUNITY): Payer: Self-pay | Admitting: Gastroenterology

## 2019-10-09 ENCOUNTER — Encounter (HOSPITAL_COMMUNITY)
Admission: RE | Disposition: A | Discharge status: Home / Self Care | Payer: Self-pay | Source: Home / Self Care | Attending: Gastroenterology

## 2019-10-09 ENCOUNTER — Ambulatory Visit (HOSPITAL_COMMUNITY)
Admission: RE | Admit: 2019-10-09 | Discharge: 2019-10-09 | Disposition: A | Discharge status: Home / Self Care | Payer: Medicare Other | Attending: Gastroenterology | Admitting: Gastroenterology

## 2019-10-09 DIAGNOSIS — Z85820 Personal history of malignant melanoma of skin: Secondary | ICD-10-CM | POA: Insufficient documentation

## 2019-10-09 DIAGNOSIS — K2950 Unspecified chronic gastritis without bleeding: Secondary | ICD-10-CM | POA: Diagnosis not present

## 2019-10-09 DIAGNOSIS — K259 Gastric ulcer, unspecified as acute or chronic, without hemorrhage or perforation: Secondary | ICD-10-CM | POA: Diagnosis not present

## 2019-10-09 DIAGNOSIS — Z833 Family history of diabetes mellitus: Secondary | ICD-10-CM | POA: Insufficient documentation

## 2019-10-09 DIAGNOSIS — J45909 Unspecified asthma, uncomplicated: Secondary | ICD-10-CM | POA: Insufficient documentation

## 2019-10-09 DIAGNOSIS — M1990 Unspecified osteoarthritis, unspecified site: Secondary | ICD-10-CM | POA: Diagnosis not present

## 2019-10-09 DIAGNOSIS — K8689 Other specified diseases of pancreas: Secondary | ICD-10-CM | POA: Diagnosis not present

## 2019-10-09 DIAGNOSIS — K228 Other specified diseases of esophagus: Secondary | ICD-10-CM

## 2019-10-09 DIAGNOSIS — K3189 Other diseases of stomach and duodenum: Secondary | ICD-10-CM | POA: Insufficient documentation

## 2019-10-09 DIAGNOSIS — Z91041 Radiographic dye allergy status: Secondary | ICD-10-CM | POA: Insufficient documentation

## 2019-10-09 DIAGNOSIS — K8020 Calculus of gallbladder without cholecystitis without obstruction: Secondary | ICD-10-CM

## 2019-10-09 DIAGNOSIS — Z880 Allergy status to penicillin: Secondary | ICD-10-CM | POA: Diagnosis not present

## 2019-10-09 DIAGNOSIS — K869 Disease of pancreas, unspecified: Secondary | ICD-10-CM | POA: Diagnosis not present

## 2019-10-09 DIAGNOSIS — D378 Neoplasm of uncertain behavior of other specified digestive organs: Secondary | ICD-10-CM | POA: Insufficient documentation

## 2019-10-09 DIAGNOSIS — Z8249 Family history of ischemic heart disease and other diseases of the circulatory system: Secondary | ICD-10-CM | POA: Insufficient documentation

## 2019-10-09 DIAGNOSIS — Z85020 Personal history of malignant carcinoid tumor of stomach: Secondary | ICD-10-CM | POA: Insufficient documentation

## 2019-10-09 DIAGNOSIS — I899 Noninfective disorder of lymphatic vessels and lymph nodes, unspecified: Secondary | ICD-10-CM | POA: Diagnosis not present

## 2019-10-09 HISTORY — PX: POLYPECTOMY: SHX5525

## 2019-10-09 HISTORY — PX: FOREIGN BODY REMOVAL: SHX962

## 2019-10-09 HISTORY — PX: EUS: SHX5427

## 2019-10-09 HISTORY — DX: Cardiac arrhythmia, unspecified: I49.9

## 2019-10-09 HISTORY — PX: ESOPHAGOGASTRODUODENOSCOPY (EGD) WITH PROPOFOL: SHX5813

## 2019-10-09 HISTORY — PX: HEMOSTASIS CLIP PLACEMENT: SHX6857

## 2019-10-09 HISTORY — PX: SUBMUCOSAL LIFTING INJECTION: SHX6855

## 2019-10-09 LAB — GLUCOSE, CAPILLARY: Glucose-Capillary: 98 mg/dL (ref 70–99)

## 2019-10-09 SURGERY — ESOPHAGOGASTRODUODENOSCOPY (EGD) WITH PROPOFOL
Anesthesia: Monitor Anesthesia Care

## 2019-10-09 MED ORDER — PROPOFOL 500 MG/50ML IV EMUL
INTRAVENOUS | Status: DC | PRN
  Administered 2019-10-09: 100 ug/kg/min via INTRAVENOUS

## 2019-10-09 MED ORDER — FENTANYL CITRATE (PF) 100 MCG/2ML IJ SOLN
INTRAMUSCULAR | Status: AC
  Filled 2019-10-09: qty 2

## 2019-10-09 MED ORDER — FENTANYL CITRATE (PF) 100 MCG/2ML IJ SOLN: 25.0000 ug | INTRAMUSCULAR | Status: DC | PRN

## 2019-10-09 MED ORDER — LIDOCAINE HCL (CARDIAC) PF 100 MG/5ML IV SOSY
PREFILLED_SYRINGE | INTRAVENOUS | Status: DC | PRN
  Administered 2019-10-09: 60 mg via INTRATRACHEAL

## 2019-10-09 MED ORDER — ONDANSETRON HCL 4 MG/2ML IJ SOLN
INTRAMUSCULAR | Status: DC | PRN
  Administered 2019-10-09: 4 mg via INTRAVENOUS

## 2019-10-09 MED ORDER — FENTANYL CITRATE (PF) 100 MCG/2ML IJ SOLN
25.0000 ug | INTRAMUSCULAR | Status: DC | PRN
  Administered 2019-10-09: 12:00:00 25 ug via INTRAVENOUS

## 2019-10-09 MED ORDER — ONDANSETRON HCL 4 MG/2ML IJ SOLN: 4.0000 mg | Freq: Once | INTRAMUSCULAR | Status: DC | PRN

## 2019-10-09 MED ORDER — PROPOFOL 10 MG/ML IV BOLUS
INTRAVENOUS | Status: DC | PRN
  Administered 2019-10-09: 20 mg via INTRAVENOUS

## 2019-10-09 MED ORDER — SODIUM CHLORIDE 0.9 % IV SOLN: INTRAVENOUS | Status: DC

## 2019-10-09 MED ORDER — LACTATED RINGERS IV SOLN
INTRAVENOUS | Status: AC | PRN
  Administered 2019-10-09: 1000 mL via INTRAVENOUS

## 2019-10-09 MED ORDER — OMEPRAZOLE 40 MG PO CPDR: 40.0000 mg | DELAYED_RELEASE_CAPSULE | Freq: Two times a day (BID) | ORAL | 4 refills | Status: DC

## 2019-10-09 SURGICAL SUPPLY — 15 items

## 2019-10-09 NOTE — Op Note (Signed)
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 Estimated Blood Loss:     Estimated blood loss was minimal. Procedure:                Pre-Anesthesia Assessment:                           - Prior to the procedure, a History and Physical                            was performed, and patient medications and                            allergies were reviewed. The patient's tolerance of                            previous anesthesia was also reviewed. The risks                            and benefits of the procedure and the sedation                            options and risks were discussed with the patient.                            All questions were answered, and informed consent  was obtained. Prior Anticoagulants: The patient                            last took Coumadin (warfarin) 5 days and Lovenox                            (enoxaparin) 1 day prior to the procedure. ASA                            Grade Assessment: III - A patient with severe                            systemic disease. After reviewing the risks  and                            benefits, the patient was deemed in satisfactory                            condition to undergo the procedure.                           After obtaining informed consent, the endoscope was                            passed under direct vision. Throughout the                            procedure, the patient's blood pressure, pulse, and                            oxygen saturations were monitored continuously. The                            [id number]-[id number] ([id number]) Olympus therapeutic                            gastroscope was introduced through the mouth, and                            advanced to the second part of duodenum. The                            [id number] ([id number]) Olympus Linear EUS scope was                            introduced through the mouth, and advanced to the                            duodenum for ultrasound examination from the                            stomach and duodenum. The upper EUS was  accomplished without difficulty. The patient                            tolerated the procedure. Scope In: Scope Out: Findings:      ENDOSCOPIC FINDING: :      No gross lesions were noted in the entire esophagus.      The Z-line was irregular and was found 40 cm from the incisors.      A medium post mucosectomy scar was found on the proximal greater       curvature of the gastric body. There was residual polypoid tissue on 2       hemoclipped regions. The polypoid tissue and hemoclips were removed with       a cold snare. Resection and retrieval were complete. To prevent bleeding       post-intervention, three hemostatic clips were successfully placed (MR       conditional). There was no bleeding at the end of the procedure.      A small post mucosectomy scar was found on the distal greater curvature       of the gastric body. There was residual polypoid tissue on 1 hemoclipped       region. The polypoid tissue and  hemoclip was removed with a cold snare.       Resection and retrieval were complete. To prevent bleeding       post-intervention, two hemostatic clips were successfully placed (MR       conditional). There was no bleeding at the end of the procedure.      A small post mucosectomy scar was found on the lesser curvature of the       stomach. There was residual polypoid tissue on 1 hemoclipped region. The       polypoid tissue and hemoclip was removed with a cold snare. Resection       and retrieval were complete. To prevent bleeding post-intervention, one       hemostatic clip was successfully placed (MR conditional). There was no       bleeding at the end of the procedure.      Multiple dispersed small erosions with no bleeding and no stigmata of       recent bleeding were found in the gastric body.      No other gross lesions were noted in the entire examined stomach.      No gross lesions were noted in the duodenal bulb, in the first portion       of the duodenum and in the second portion of the duodenum.      ENDOSONOGRAPHIC FINDING: :      An irregular lesion was identified in the mid-pancreatic body. The       lesion was hypoechoic and measured 4 mm by 4 mm in maximal       cross-sectional diameter. The outer margins were irregular. An intact       interface was seen between the mass and the superior mesenteric artery,       celiac trunk, splenic artery and superior mesenteric vein suggesting a       lack of invasion. The remainder of the pancreas was examined. The       endosonographic appearance of parenchyma and the upstream pancreatic       duct indicated a normal appearing duct, no ductal or parenchymal       calcifications and no parenchymal atrophy.  Fine needle biopsy was       performed in attempt of confirming if this is a NET (as concerned on the       DOTATATE Scan) Color Doppler imaging was utilized prior to needle       puncture to confirm a lack of significant vascular  structures within the       needle path - difficult in position due to the SMV just below the region       and breathing pattern to get good views. Three passes were made with the       25 gauge ultrasound core biopsy needle using a transgastric approach. A       visible core of tissue was obtained. A preliminary cytologic examination       was performed. Final cytology results are pending.      There was no sign of significant endosonographic abnormality in the       common bile duct (2.9 mm -> 3.4 mm) and in the common hepatic duct (4.5       mm). No stones, no biliary sludge and ducts of normal caliber were       identified.      Endosonographic imaging of the ampulla showed no intramural       (subepithelial) lesion.      Endosonographic imaging in the visualized portion of the liver showed no       mass.      No malignant-appearing lymph nodes were visualized in the celiac region       (level 20), peripancreatic region and porta hepatis region.      The celiac region was visualized. Impression:               EGD Impression:                           - No gross lesions in esophagus. Z-line irregular,                            40 cm from the incisors.                           - Scar in the gastric body (greater curvature).                            Removed clips and polypoid tissue. Clips (MR                            conditional) were placed.                           - Scar in the gastric body (greater curvature).                            Removed clips and polypoid tissue. Clips (MR                            conditional) were placed.                           - Scar in the gastric body (greater curvature).  Removed clips and polypoid tissue. Clips (MR                            conditional) were placed.                           - Erosive gastropathy with no bleeding and no                            stigmata of recent bleeding.                            - No gross lesions in the duodenal bulb, in the                            first portion of the duodenum and in the second                            portion of the duodenum.                           EUS Impression:                           - A lesion was identified in the pancreatic body.                            Tissue was obtained from this exam, and results are                            pending. However, the endosonographic appearance is                            suggestive of a neuroendocrine tumor (DOTATATE                            suggested this). Fine needle biopsy performed,                            though difficult in region to get steady sampling                            and due to the overall size of the reported lesion..                           - There was no sign of significant pathology in the                            common bile duct and in the common hepatic duct.                           - No malignant-appearing lymph nodes were  visualized in the celiac region (level 20),                            peripancreatic region and porta hepatis region. Recommendation:           - The patient will be observed post-procedure,                            until all discharge criteria are met.                           - Discharge patient to home.                           - Patient has a contact number available for                            emergencies. The signs and symptoms of potential                            delayed complications were discussed with the                            patient. Return to normal activities tomorrow.                            Written discharge instructions were provided to the                            patient.                           - Low fat diet.                           - Observe patient's clinical course.                           - Await cytology results and await path results.                            - Repeat the upper endoscopic ultrasound for                            surveillance based on pathology results.                           - Restart Omeprazole 40 mg twice daily x 4-weeks                            and then 40 mg daily for next 8-weeks.                           - Based on prior history and anticoagulation needs,  will proceed with similar restart as last EUS.                            Lovenox in 24 hours and Coumadin restart as per                            Coumadin clinic in no sooner than 24 hours.                           - The findings and recommendations were discussed                            with the patient.                           - The findings and recommendations were discussed                            with the patient's family. Procedure Code(s):        --- Professional ---                           [phone], Esophagogastroduodenoscopy, flexible,                            transoral; with transendoscopic ultrasound-guided                            intramural or transmural fine needle                            aspiration/biopsy(s), (includes endoscopic                            ultrasound examination limited to the esophagus,                            stomach or duodenum, and adjacent structures)                           43251, Esophagogastroduodenoscopy, flexible,                            transoral; with removal of tumor(s), polyp(s), or                            other lesion(s) by snare technique Diagnosis Code(s):        --- Professional ---                           K22.8, Other specified diseases of esophagus                           K31.89, Other diseases of stomach and duodenum  K86.89, Other specified diseases of pancreas                           K80.20, Calculus of gallbladder without                            cholecystitis without obstruction                            I89.9, Noninfective disorder of lymphatic vessels                            and lymph nodes, unspecified                           D37.8, Neoplasm of uncertain behavior of other                            specified digestive organs                           R93.5, Abnormal findings on diagnostic imaging of                            other abdominal regions, including retroperitoneum CPT copyright 2019 [employer]. All rights reserved. The codes documented in this report are preliminary and upon coder review may  be revised to meet current compliance requirements. Justice Britain, MD 10/09/2019 12:17:19 PM Number of Addenda: 0

## 2019-10-09 NOTE — Anesthesia Postprocedure Evaluation (Signed)
Anesthesia Post Note  Patient: Erin Kirk  Procedure(s) Performed: ESOPHAGOGASTRODUODENOSCOPY (EGD) WITH PROPOFOL (N/A ) UPPER ENDOSCOPIC ULTRASOUND (EUS) RADIAL (N/A ) SUBMUCOSAL LIFTING INJECTION POLYPECTOMY FOREIGN BODY REMOVAL HEMOSTASIS CLIP PLACEMENT     Patient location during evaluation: PACU Anesthesia Type: MAC Level of consciousness: awake and alert Pain management: pain level controlled Vital Signs Assessment: post-procedure vital signs reviewed and stable Respiratory status: spontaneous breathing, nonlabored ventilation and respiratory function stable Cardiovascular status: stable and blood pressure returned to baseline Anesthetic complications: no    Last Vitals:  Vitals:   10/09/19 1215 10/09/19 1230  BP: 139/77 131/77  Pulse: (!) 57 (!) 55  Resp: 14 16  Temp: 36.4 C   SpO2: 99% 98%    Last Pain:  Vitals:   10/09/19 1215  TempSrc:   PainSc: 3                  Audry Pili

## 2019-10-09 NOTE — H&P (Signed)
GASTROENTEROLOGY PROCEDURE H&P NOTE   Primary Care Physician: Erin Olp, MD  HPI: Erin Kirk is a 64 y.o. female who presents for EGD/EUS with possible FNB/EMR.  Past Medical History:  Diagnosis Date  . Allergy   . Anxiety   . Arthritis   . Asthma    many years ago  . Blood transfusion without reported diagnosis   . Cataract   . Clotting disorder ([place])   . Depression    Husband died Jul 11, 2019  . Diabetes ([place])    not on medication  . Dysrhythmia   . Family history of bone cancer   . Family history of breast cancer   . Family history of lung cancer   . Family history of ovarian cancer   . Family history of testicular cancer   . Family history of uterine cancer   . GERD (gastroesophageal reflux disease)   . Hypertension   . Hypothyroidism   . Iron deficiency anemia 03/30/2019  . Irregular heart beat    tachycardia  . Lupus anticoagulant with hypercoagulable state ([place])   . Melanoma ([place])    2005- face   . Pneumonia 2019  . Primary hypercoagulable state ([place])   . 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 Pulmonary embolism ([place])   . Thyroid disease    hypothyroidism   Past Surgical History:  Procedure Laterality Date  . ABDOMINAL HYSTERECTOMY     noncancerous  . BACK SURGERY  1983  . CHOLECYSTECTOMY    . COLONOSCOPY    . ENDOSCOPIC MUCOSAL RESECTION N/A 07/12/2019   Procedure: ENDOSCOPIC MUCOSAL RESECTION;  Surgeon: Erin Landmark Erin Nab., MD;  Location: [place];  Service: Gastroenterology;  Laterality: N/A;  . ESOPHAGOGASTRODUODENOSCOPY (EGD) WITH PROPOFOL N/A 07/12/2019   Procedure: ESOPHAGOGASTRODUODENOSCOPY (EGD) WITH PROPOFOL;  Surgeon: Erin Landmark Erin Nab., MD;  Location: [place];  Service: Gastroenterology;  Laterality: N/A;  . EUS N/A 07/12/2019   Procedure: UPPER ENDOSCOPIC ULTRASOUND (EUS) RADIAL;  Surgeon: Erin Landmark Erin Nab., MD;  Location: [facility];  Service:  Gastroenterology;  Laterality: N/A;  . fatty tumor breast Right    removed  . HEMOSTASIS CLIP PLACEMENT  07/12/2019   Procedure: HEMOSTASIS CLIP PLACEMENT;  Surgeon: Erin Copas., MD;  Location: [facility];  Service: Gastroenterology;;  . HEMOSTASIS CONTROL  07/12/2019   Procedure: HEMOSTASIS CONTROL;  Surgeon: Erin Copas., MD;  Location: [facility];  Service: Gastroenterology;;  . [name]    . KNEE ARTHROSCOPY Left   . Lymp nodes Right 08/2004  . MELANOMA EXCISION Right 2005   face   . OOPHORECTOMY Bilateral   . POLYPECTOMY  07/12/2019   Procedure: POLYPECTOMY;  Surgeon: Mansouraty, Erin Nab., MD;  Location: [facility];  Service: Gastroenterology;;  . removal lymphnodes in right neck    . SUBMUCOSAL LIFTING INJECTION  07/12/2019   Procedure: SUBMUCOSAL LIFTING INJECTION;  Surgeon: Erin Landmark Erin Nab., MD;  Location: [place];  Service: Gastroenterology;;  . UPPER GASTROINTESTINAL ENDOSCOPY     Current Facility-Administered Medications  Medication Dose Route Frequency Provider Last Rate Last Admin  . 0.9 %  sodium chloride infusion   Intravenous Continuous Mansouraty, Erin Nab., MD      . lactated ringers infusion    Continuous PRN Mansouraty, Erin Nab., MD   1,000 mL at 10/09/19 0827   Allergies  Allergen Reactions  . Guaifenesin & Derivatives Nausea And Vomiting    Hallucinations  . Iohexol Anaphylaxis, Hives and Shortness Of Breath    Incident  1990, premedicated prior to obtaining IV contrast since.  . Amoxicillin-Pot Clavulanate Swelling and Other (See Comments)    Facial swelling Did it involve swelling of the face/tongue/throat, SOB, or low BP? Yes Did it involve sudden or severe rash/hives, skin peeling, or any reaction on the inside of your mouth or nose? No Did you need to seek medical attention at a hospital or doctor's office? No When did it last happen?~7-8 years ago If all above answers are "NO", may proceed with  cephalosporin use.   . Other     Contrast dye   Family History  Problem Relation Age of Onset  . Coronary artery disease Mother        Mom 32, Dad 57-smoker  . Diabetes Mother   . Ovarian cancer Mother 71       not officially diagnosed  . Hypertension Father   . Diabetes Father   . Mitral valve prolapse Sister   . Diabetes Sister   . Breast cancer Maternal Aunt 84  . Breast cancer Half-Sister 2  . Breast cancer Half-Sister 22  . Uterine cancer Niece 21  . Breast cancer Niece        diagnosed mid-60s  . Lung cancer Nephew   . Bone cancer Nephew 64  . Cancer Niece 9       unknown type  . Alzheimer's disease Maternal Aunt   . Breast cancer Other        maternal great-aunt, diagnosed in her 41s  . Breast cancer Other        4th degree maternal relative, diagnosed in her 55s  . Colon cancer Neg Hx   . Esophageal cancer Neg Hx   . Pancreatic cancer Neg Hx   . Rectal cancer Neg Hx   . Stomach cancer Neg Hx   . Inflammatory bowel disease Neg Hx   . Liver disease Neg Hx    Social History   Socioeconomic History  . Marital status: Widowed    Spouse name: Not on file  . Number of children: Not on file  . Years of education: Not on file  . Highest education level: Not on file  Occupational History  . Not on file  Tobacco Use  . Smoking status: Never Smoker  . Smokeless tobacco: Never Used  Substance and Sexual Activity  . Alcohol use: No    Alcohol/week: 0.0 standard drinks    Comment: no   . Drug use: No  . Sexual activity: Yes  Other Topics Concern  . Not on file  Social History Narrative   Married 1971. 1 child [name] (by [employer]) lives in [place] with 2 grandchildren girls 8,6 in 2015.       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    Social Determinants of Health   Financial Resource Strain:   . Difficulty of Paying Living Expenses: Not on file  Food Insecurity:   .  Worried About Charity fundraiser in the Last Year: Not on file  . Ran Out of Food in the Last Year: Not on file  Transportation Needs:   . Lack of Transportation (Medical): Not on file  . Lack of Transportation (Non-Medical): Not on file  Physical Activity:   . Days of Exercise per Week: Not on file  . Minutes of Exercise per Session: Not on file  Stress:   . Feeling of Stress : Not on  file  Social Connections:   . Frequency of Communication with Friends and Family: Not on file  . Frequency of Social Gatherings with Friends and Family: Not on file  . Attends Religious Services: Not on file  . Active Member of Clubs or Organizations: Not on file  . Attends Archivist Meetings: Not on file  . Marital Status: Not on file  Intimate Partner Violence:   . Fear of Current or Ex-Partner: Not on file  . Emotionally Abused: Not on file  . Physically Abused: Not on file  . Sexually Abused: Not on file    Physical Exam: Vital signs in last 24 hours: Temp:  [98 F (36.7 C)] 98 F (36.7 C) (01/18 0813) Resp:  [20] 20 (01/18 0813) BP: (119)/(63) 119/63 (01/18 0813) SpO2:  [100 %] 100 % (01/18 0813) Weight:  [88.5 kg] 88.5 kg (01/18 0813)   GEN: NAD EYE: Sclerae anicteric ENT: MMM CV: Non-tachycardic GI: Soft, 1-2/10 pain in midabdomen NEURO:  Alert & Oriented x 3  Lab Results: No results for input(s): WBC, HGB, HCT, PLT in the last 72 hours. BMET No results for input(s): NA, K, CL, CO2, GLUCOSE, BUN, CREATININE, CALCIUM in the last 72 hours. LFT No results for input(s): PROT, ALBUMIN, AST, ALT, ALKPHOS, BILITOT, BILIDIR, IBILI in the last 72 hours. PT/INR No results for input(s): LABPROT, INR in the last 72 hours.   Impression / Plan: This is a 64 y.o.female who presents for who presents for EGD/EUS with possible FNB/EMR.  The risks of EUS including bleeding, infection, aspiration pneumonia and intestinal perforation were discussed as was the possibility it may not  give a definitive diagnosis.  If a biopsy of the pancreas is done as part of the EUS, there is an additional risk of pancreatitis at the rate of about 1%.  It was explained that procedure related pancreatitis is typically mild, although can be severe and even life threatening, which is why we do not perform random pancreatic biopsies and only biopsy a lesion we feel is concerning enough to warrant the risk.  The risks and benefits of endoscopic evaluation were discussed with the patient; these include but are not limited to the risk of perforation, infection, bleeding, missed lesions, lack of diagnosis, severe illness requiring hospitalization, as well as anesthesia and sedation related illnesses.  The patient is agreeable to proceed.    Justice Britain, MD [facility] # [phone]

## 2019-10-09 NOTE — Anesthesia Procedure Notes (Signed)
Procedure Name: MAC Date/Time: 10/09/2019 10:02 AM Performed by: Kathryne Hitch, CRNA Pre-anesthesia Checklist: Patient identified, Emergency Drugs available, Suction available and Patient being monitored Patient Re-evaluated:Patient Re-evaluated prior to induction Oxygen Delivery Method: Nasal cannula Preoxygenation: Pre-oxygenation with 100% oxygen Induction Type: IV induction Dental Injury: Teeth and Oropharynx as per pre-operative assessment

## 2019-10-09 NOTE — Transfer of Care (Signed)
Immediate Anesthesia Transfer of Care Note  Patient: Erin Kirk  Procedure(s) Performed: ESOPHAGOGASTRODUODENOSCOPY (EGD) WITH PROPOFOL (N/A ) UPPER ENDOSCOPIC ULTRASOUND (EUS) RADIAL (N/A ) SUBMUCOSAL LIFTING INJECTION POLYPECTOMY FOREIGN BODY REMOVAL HEMOSTASIS CLIP PLACEMENT  Patient Location: PACU  Anesthesia Type:MAC  Level of Consciousness: drowsy and patient cooperative  Airway & Oxygen Therapy: Patient Spontanous Breathing and Patient connected to nasal cannula oxygen  Post-op Assessment: Report given to RN and Post -op Vital signs reviewed and stable  Post vital signs: Reviewed and stable  Last Vitals:  Vitals Value Taken Time  BP 126/68 10/09/19 1145  Temp    Pulse 70 10/09/19 1149  Resp 14 10/09/19 1149  SpO2 100 % 10/09/19 1149  Vitals shown include unvalidated device data.  Last Pain:  Vitals:   10/09/19 0813  TempSrc: Oral  PainSc: 0-No pain         Complications: No apparent anesthesia complications

## 2019-10-09 NOTE — Anesthesia Preprocedure Evaluation (Addendum)
Anesthesia Evaluation  Patient identified by MRN, date of birth, ID band Patient awake    Reviewed: Allergy & Precautions, NPO status , Patient's Chart, lab work & pertinent test results, reviewed documented beta blocker date and time   History of Anesthesia Complications Negative for: history of anesthetic complications  Airway Mallampati: II  TM Distance: >3 FB Neck ROM: Full    Dental  (+) Dental Advisory Given, Chipped,    Pulmonary asthma , PE   Pulmonary exam normal        Cardiovascular hypertension, Pt. on home beta blockers and Pt. on medications Normal cardiovascular exam+ dysrhythmias      Neuro/Psych PSYCHIATRIC DISORDERS Anxiety Depression negative neurological ROS     GI/Hepatic Neg liver ROS, GERD  Medicated and Controlled, Multiple gastric carcinoid tumors s/p resection, with residual tumors in stomach      Endo/Other  diabetesHypothyroidism  Obesity   Renal/GU CRFRenal disease     Musculoskeletal  (+) Arthritis ,   Abdominal   Peds  Hematology  Lupus anticoagulant on coumadin     Anesthesia Other Findings Covid neg 1/14  Reproductive/Obstetrics                           Anesthesia Physical Anesthesia Plan  ASA: III  Anesthesia Plan: MAC   Post-op Pain Management:    Induction: Intravenous  PONV Risk Score and Plan: 2 and Propofol infusion and Treatment may vary due to age or medical condition  Airway Management Planned: Nasal Cannula and Natural Airway  Additional Equipment: None  Intra-op Plan:   Post-operative Plan:   Informed Consent: I have reviewed the patients History and Physical, chart, labs and discussed the procedure including the risks, benefits and alternatives for the proposed anesthesia with the patient or authorized representative who has indicated his/her understanding and acceptance.       Plan Discussed with: CRNA and  Anesthesiologist  Anesthesia Plan Comments:        Anesthesia Quick Evaluation

## 2019-10-10 LAB — SURGICAL PATHOLOGY

## 2019-10-10 LAB — CYTOLOGY - NON PAP

## 2019-10-17 ENCOUNTER — Encounter: Payer: Self-pay | Admitting: Gastroenterology

## 2019-10-18 ENCOUNTER — Ambulatory Visit (INDEPENDENT_AMBULATORY_CARE_PROVIDER_SITE_OTHER): Payer: Medicare Other | Admitting: General Practice

## 2019-10-18 ENCOUNTER — Other Ambulatory Visit: Payer: Self-pay

## 2019-10-18 DIAGNOSIS — K869 Disease of pancreas, unspecified: Secondary | ICD-10-CM

## 2019-10-18 DIAGNOSIS — Z7901 Long term (current) use of anticoagulants: Secondary | ICD-10-CM

## 2019-10-18 DIAGNOSIS — D6859 Other primary thrombophilia: Secondary | ICD-10-CM

## 2019-10-18 LAB — POCT INR: INR: 2 (ref 2.0–3.0)

## 2019-10-18 NOTE — Patient Instructions (Signed)
Pre visit review using our clinic review tool, if applicable. No additional management support is needed unless otherwise documented below in the visit note.  Take 1 tablet today and then take 1 1/2 tablets tomorrow (1/28) and then continue to take 1 tablet daily except 1/2 tablet on Mon Wed Fri.  Re-check in 3 weeks.

## 2019-10-18 NOTE — Progress Notes (Signed)
I have reviewed and agree with note, evaluation, plan.   Braidon Chermak, MD  

## 2019-10-28 ENCOUNTER — Other Ambulatory Visit: Payer: Self-pay | Admitting: Family Medicine

## 2019-11-08 ENCOUNTER — Other Ambulatory Visit: Payer: Self-pay

## 2019-11-08 ENCOUNTER — Ambulatory Visit (INDEPENDENT_AMBULATORY_CARE_PROVIDER_SITE_OTHER): Payer: Medicare Other | Admitting: General Practice

## 2019-11-08 DIAGNOSIS — Z7901 Long term (current) use of anticoagulants: Secondary | ICD-10-CM | POA: Diagnosis not present

## 2019-11-08 DIAGNOSIS — D6859 Other primary thrombophilia: Secondary | ICD-10-CM

## 2019-11-08 LAB — POCT INR: INR: 1.9 — AB (ref 2.0–3.0)

## 2019-11-08 NOTE — Progress Notes (Signed)
I have reviewed and agree with note, evaluation, plan.   Agustus Mane, MD  

## 2019-11-08 NOTE — Patient Instructions (Addendum)
Pre visit review using our clinic review tool, if applicable. No additional management support is needed unless otherwise documented below in the visit note.  Take 1 tablet today, 1 1/2 tablets tomorrow and then take 1 tablet daily except 1/2 tablet on Mondays.  Re-check in 3 weeks.

## 2019-11-13 ENCOUNTER — Other Ambulatory Visit: Payer: Self-pay

## 2019-11-13 DIAGNOSIS — C7A092 Malignant carcinoid tumor of the stomach: Secondary | ICD-10-CM

## 2019-11-14 ENCOUNTER — Other Ambulatory Visit: Payer: Self-pay

## 2019-11-14 ENCOUNTER — Inpatient Hospital Stay: Payer: Medicare Other | Admitting: Oncology

## 2019-11-14 ENCOUNTER — Inpatient Hospital Stay: Payer: Medicare Other | Attending: Oncology

## 2019-11-14 VITALS — BP 120/70 | HR 58 | Temp 98.9°F | Resp 20 | Ht 64.0 in | Wt 192.8 lb

## 2019-11-14 DIAGNOSIS — E119 Type 2 diabetes mellitus without complications: Secondary | ICD-10-CM | POA: Diagnosis not present

## 2019-11-14 DIAGNOSIS — C7A092 Malignant carcinoid tumor of the stomach: Secondary | ICD-10-CM

## 2019-11-14 DIAGNOSIS — K869 Disease of pancreas, unspecified: Secondary | ICD-10-CM | POA: Diagnosis not present

## 2019-11-14 DIAGNOSIS — D500 Iron deficiency anemia secondary to blood loss (chronic): Secondary | ICD-10-CM | POA: Insufficient documentation

## 2019-11-14 DIAGNOSIS — Z85828 Personal history of other malignant neoplasm of skin: Secondary | ICD-10-CM | POA: Insufficient documentation

## 2019-11-14 DIAGNOSIS — Z85020 Personal history of malignant carcinoid tumor of stomach: Secondary | ICD-10-CM | POA: Diagnosis not present

## 2019-11-14 DIAGNOSIS — C7A8 Other malignant neuroendocrine tumors: Secondary | ICD-10-CM | POA: Diagnosis not present

## 2019-11-14 DIAGNOSIS — R109 Unspecified abdominal pain: Secondary | ICD-10-CM | POA: Diagnosis not present

## 2019-11-14 DIAGNOSIS — E039 Hypothyroidism, unspecified: Secondary | ICD-10-CM | POA: Insufficient documentation

## 2019-11-14 DIAGNOSIS — Z7901 Long term (current) use of anticoagulants: Secondary | ICD-10-CM | POA: Insufficient documentation

## 2019-11-14 LAB — CBC WITH DIFFERENTIAL (CANCER CENTER ONLY)
Abs Immature Granulocytes: 0.02 10*3/uL (ref 0.00–0.07)
Basophils Absolute: 0.1 10*3/uL (ref 0.0–0.1)
Basophils Relative: 1 %
Eosinophils Absolute: 0.4 10*3/uL (ref 0.0–0.5)
Eosinophils Relative: 6 %
HCT: 37.1 % (ref 36.0–46.0)
Hemoglobin: 12.1 g/dL (ref 12.0–15.0)
Immature Granulocytes: 0 %
Lymphocytes Relative: 28 %
Lymphs Abs: 2 10*3/uL (ref 0.7–4.0)
MCH: 27.6 pg (ref 26.0–34.0)
MCHC: 32.6 g/dL (ref 30.0–36.0)
MCV: 84.5 fL (ref 80.0–100.0)
Monocytes Absolute: 0.5 10*3/uL (ref 0.1–1.0)
Monocytes Relative: 6 %
Neutro Abs: 4.1 10*3/uL (ref 1.7–7.7)
Neutrophils Relative %: 59 %
Platelet Count: 225 10*3/uL (ref 150–400)
RBC: 4.39 MIL/uL (ref 3.87–5.11)
RDW: 13 % (ref 11.5–15.5)
WBC Count: 7.1 10*3/uL (ref 4.0–10.5)
nRBC: 0 % (ref 0.0–0.2)

## 2019-11-14 NOTE — Progress Notes (Signed)
  [facility] OFFICE PROGRESS NOTE   Diagnosis: Carcinoid tumors  INTERVAL HISTORY:   Erin Kirk returns as scheduled.  She feels well.  No flushing or diarrhea.  She has occasional cramping discomfort in the right lower abdomen.  She held Prilosec for several days in anticipation of the chromogranin A level from today. She underwent a repeat upper endoscopy/EUS on 10/09/2019.  Area of the scar and clips were removed from 3 locations at the greater curvature and the pathology returned negative for carcinoma.  A 4 mm lesion was noted in the mid pancreas body on EUS.  An FNA biopsy and the cytology revealed no malignant cells.  No malignant appearing lymph nodes on EUS.  Objective:  Vital signs in last 24 hours:  Blood pressure 120/70, pulse (!) 58, temperature 98.9 F (37.2 C), temperature source Temporal, resp. rate 20, height 5\' 4"  (1.626 m), weight 192 lb 12.8 oz (87.5 kg), SpO2 100 %.    Lymphatics: No cervical, supraclavicular, axillary, or inguinal nodes GI: Nontender, no mass, no hepatosplenomegaly Vascular: No leg edema  Lab Results:  Lab Results  Component Value Date   WBC 7.1 11/14/2019   HGB 12.1 11/14/2019   HCT 37.1 11/14/2019   MCV 84.5 11/14/2019   PLT 225 11/14/2019   NEUTROABS 4.1 11/14/2019    CMP  Lab Results  Component Value Date   NA 139 07/27/2019   K 3.7 07/27/2019   CL 100 07/27/2019   CO2 30 07/27/2019   GLUCOSE 135 (H) 07/27/2019   BUN 15 07/27/2019   CREATININE 1.12 07/27/2019   CALCIUM 9.1 07/27/2019   PROT 6.7 07/27/2019   ALBUMIN 4.4 07/27/2019   AST 11 07/27/2019   ALT 15 07/27/2019   ALKPHOS 71 07/27/2019   BILITOT 0.6 07/27/2019   GFRNONAA 46 (L) 07/12/2019   GFRAA 53 (L) 07/12/2019   Chromogranin A on 09/26/2019: 1294  Medications: I have reviewed the patient's current medications.   Assessment/Plan: 1. Multiple gastric carcinoid tumors  Upper endoscopy 05/05/2019 with resection of multiple gastric carcinoid  tumors  EUS 07/12/2019-resection of multiple gastric carcinoid tumors, positive resection margin at the gastric cardia polypectomy site, tubular adenomas and hyperplastic polyps removed from the lesser curvature  Elevated chromogranin A level  CT abdomen/pelvis 05/18/2019-no evidence of a gastric mass or metastatic disease  Netspot 08/11/2019-focal activity corresponding to a 2-3 mm lesion in the mid pancreas, diffuse activity in the stomach  EGD 10/09/2019- 3 areas of scar/clip/polypoid tissue removed with negative pathology  EUS 10/09/2019-4 mm pancreas body lesion, FNA biopsy negative, no adenopathy 2. Iron deficiency anemia secondary bleeding from #1 and maintained on anticoagulation therapy 3. 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 Diabetes 5. Abdominal pain-potentially related to #1 6.   Hypothyroidism 7.  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  Family history of uterine and breast cancer 9.  Gastric and colonic adenomas 10.  Hypodense pancreas lesion 05/18/2019 -positive Netspot uptake 08/11/2019    Disposition: Ms. Murtagh appears unchanged.  She appears asymptomatic from carcinoid disease.  I suspect the isolated pancreas lesion is a primary carcinoid tumor.  She will continue surveillance of the stomach and pancreas with Dr. Rush Landmark.  We will follow up on the chromogranin A level from today.  She will return for an office visit in 6 months.  Betsy Coder, MD  11/14/2019  12:19 PM

## 2019-11-15 ENCOUNTER — Telehealth: Payer: Self-pay | Admitting: Oncology

## 2019-11-15 LAB — CHROMOGRANIN A: Chromogranin A (ng/mL): 946.6 ng/mL — ABNORMAL HIGH (ref 0.0–101.8)

## 2019-11-15 NOTE — Telephone Encounter (Signed)
Scheduled per los. Called and left msg. Mailed printout  °

## 2019-11-16 ENCOUNTER — Telehealth: Payer: Self-pay | Admitting: *Deleted

## 2019-11-16 ENCOUNTER — Telehealth: Payer: Self-pay | Admitting: Oncology

## 2019-11-16 NOTE — Telephone Encounter (Signed)
Scheduled 8/24 msg per 2/25 sch msg. Confirmed date and time with pt.

## 2019-11-16 NOTE — Telephone Encounter (Signed)
Notified of lower chromogranin results and compared w/prior two results. Informed her to f/u in 6 months for lab as ordered. She has not been called w/her appointment yet and is requesting a call from scheduling soon--has several appointments to coordinate. Sent message to scheduler to follow up.

## 2019-11-28 ENCOUNTER — Other Ambulatory Visit: Payer: Self-pay

## 2019-11-28 MED ORDER — TELMISARTAN 40 MG PO TABS: 40.0000 mg | ORAL_TABLET | Freq: Every day | ORAL | 3 refills | Status: DC

## 2019-11-29 ENCOUNTER — Ambulatory Visit (INDEPENDENT_AMBULATORY_CARE_PROVIDER_SITE_OTHER): Payer: Medicare Other | Admitting: General Practice

## 2019-11-29 ENCOUNTER — Other Ambulatory Visit: Payer: Self-pay

## 2019-11-29 DIAGNOSIS — Z7901 Long term (current) use of anticoagulants: Secondary | ICD-10-CM | POA: Diagnosis not present

## 2019-11-29 DIAGNOSIS — D6859 Other primary thrombophilia: Secondary | ICD-10-CM

## 2019-11-29 LAB — POCT INR: INR: 2.9 (ref 2.0–3.0)

## 2019-11-29 NOTE — Progress Notes (Signed)
I have reviewed and agree with note, evaluation, plan.   Keiera Strathman, MD  

## 2019-11-29 NOTE — Patient Instructions (Addendum)
Pre visit review using our clinic review tool, if applicable. No additional management support is needed unless otherwise documented below in the visit note.  Continue to take 1 tablet daily except 1/2 tablet on Mondays.  Re-check in 6 weeks.  

## 2019-12-04 ENCOUNTER — Other Ambulatory Visit: Payer: Self-pay | Admitting: Family Medicine

## 2019-12-18 ENCOUNTER — Other Ambulatory Visit: Payer: Self-pay | Admitting: Family Medicine

## 2020-01-01 ENCOUNTER — Other Ambulatory Visit: Payer: Self-pay | Admitting: Family Medicine

## 2020-01-10 ENCOUNTER — Other Ambulatory Visit: Payer: Self-pay

## 2020-01-10 ENCOUNTER — Ambulatory Visit (INDEPENDENT_AMBULATORY_CARE_PROVIDER_SITE_OTHER): Payer: Medicare Other | Admitting: General Practice

## 2020-01-10 DIAGNOSIS — Z7901 Long term (current) use of anticoagulants: Secondary | ICD-10-CM | POA: Diagnosis not present

## 2020-01-10 DIAGNOSIS — D6859 Other primary thrombophilia: Secondary | ICD-10-CM

## 2020-01-10 LAB — POCT INR: INR: 3.4 — AB (ref 2.0–3.0)

## 2020-01-10 NOTE — Patient Instructions (Signed)
Pre visit review using our clinic review tool, if applicable. No additional management support is needed unless otherwise documented below in the visit note.  Continue to take 1 tablet daily except 1/2 tablet on Mondays.  Re-check in 6 weeks.  

## 2020-01-10 NOTE — Progress Notes (Signed)
I have reviewed and agree with note, evaluation, plan.   Aolani Piggott, MD  

## 2020-01-15 ENCOUNTER — Telehealth: Payer: Self-pay | Admitting: Gastroenterology

## 2020-01-15 NOTE — Telephone Encounter (Signed)
R[place], Thanks for reaching out. I think it is okay for the patient to maintain once daily dosing of 40 mg omeprazole. I am planning a follow-up procedure in around 6 months after her last 1. It would be a EGD with EUS and gastric mapping. She should follow-up with Dr. Carlean Purl in the interim but okay to continue 40 mg omeprazole until she is seen by him. Thanks. GM

## 2020-01-15 NOTE — Telephone Encounter (Signed)
Dr Rush Landmark pt is calling inquiring about if she should continue Omeprazole. I can not decipher if pt should continue after 8 weeks of once daily therapy according to your procedure note. Also pt is asking about follow-up. I see in your note recommendations for repeat EGD in 6 months. Please advise.

## 2020-01-16 NOTE — Telephone Encounter (Signed)
Thanks Erin Kirk - agree f/u you for now

## 2020-01-16 NOTE — Telephone Encounter (Signed)
Ok, will get pt scheduled for ROV in July with Dr. Rush Landmark.

## 2020-01-16 NOTE — Telephone Encounter (Signed)
If patient is doing well, she can wait. Thanks. GM

## 2020-01-16 NOTE — Telephone Encounter (Signed)
Pt informed to continue once daily Omeprazole. Pt states that she is doing fine. Pt would like to wait and be seen in follow-up in July (office) in hopes to schedule repeat EGD/EUS in August. Pt is asking if she really needs to follow-up with Dr.Gessner at this time since she is doing well? Dr.Gessner please advise? Otherwise I will get pt scheduled for ROV with Dr. Rush Landmark in July to discuss scheduling endoscopies.

## 2020-01-30 ENCOUNTER — Other Ambulatory Visit: Payer: Self-pay | Admitting: Family Medicine

## 2020-02-12 NOTE — Patient Instructions (Addendum)
Health Maintenance Due  Topic Date Due  . OPHTHALMOLOGY EXAM will send for notes  12/02/2016  . DEXA SCAN   Schedule your bone density test at check out desk. You may also call directly to X-ray at [phone] to schedule an appointment that is convenient for you.  - located [address] across the street from [place] - in the basement - you do need an appointment for the bone density tests.  Never done  . Pneumovax 23 today 02/18/2019  . MAMMOGRAM send for notes.  06/02/2019   Erin Kirk , Thank you for taking time to come for your Medicare Wellness Visit. I appreciate your ongoing commitment to your health goals. Please review the following plan we discussed and let me know if I can assist you in the future.   These are the goals we discussed: 1. Keep up great job with exercise 2.  Please check with your pharmacy one month out from pneumonia shot to see if they have the shingrix vaccine. If they do- please get this immunization and update [place] by phone call or mychart with dates you receive the vaccine  This is a list of the screening recommended for you and due dates:  Health Maintenance  Topic Date Due  . Eye exam for diabetics  12/02/2016  . DEXA scan (bone density measurement)  Never done  . Mammogram  06/02/2019  . Hemoglobin A1C  01/24/2020  . Flu Shot  04/21/2020  . Complete foot exam   02/13/2021  . Colon Cancer Screening  05/04/2024  . Tetanus Vaccine  03/18/2028  . COVID-19 Vaccine  Completed  .  Hepatitis C: One time screening is recommended by Center for Disease Control  (CDC) for  adults born from [birth year] through 1967.   Completed  . HIV Screening  Completed  . Pneumonia vaccines  Discontinued

## 2020-02-12 NOTE — Progress Notes (Signed)
Phone: [phone]   Subjective:  Patient presents today for their annual physical. Chief complaint-noted.   See problem oriented charting- Review of Systems  Constitutional: Negative for chills and fever.  HENT: Negative for hearing loss and tinnitus.   Eyes: Negative for blurred vision and double vision.  Respiratory: Negative for cough and shortness of breath.   Cardiovascular: Negative for chest pain, palpitations and leg swelling.  Gastrointestinal: Negative for blood in stool, constipation, diarrhea, heartburn, nausea and vomiting.  Genitourinary: Negative for dysuria, frequency and urgency.  Musculoskeletal: Negative for back pain, joint pain and neck pain.  Skin: Negative for rash.  Neurological: Negative for dizziness, seizures, weakness and headaches.  Endo/Heme/Allergies: Bruises/bleeds easily.       On blood thinner   Psychiatric/Behavioral: Negative for depression, hallucinations, memory loss and suicidal ideas. The patient does not have insomnia.     The following were reviewed and entered/updated in epic: Past Medical History:  Diagnosis Date  . Allergy   . Anxiety   . Arthritis   . Asthma    many years ago  . Blood transfusion without reported diagnosis   . Cataract   . Clotting disorder ([place])   . Depression    Husband died 07-04-2019  . Diabetes ([place])    not on medication  . Dysrhythmia   . Family history of bone cancer   . Family history of breast cancer   . Family history of lung cancer   . Family history of ovarian cancer   . Family history of testicular cancer   . Family history of uterine cancer   . GERD (gastroesophageal reflux disease)   . Hypertension   . Hypothyroidism   . Iron deficiency anemia 03/30/2019  . Irregular heart beat    tachycardia  . Lupus anticoagulant with hypercoagulable state ([place])   . Melanoma ([place])    2005- face   . Pneumonia 2019  . Primary hypercoagulable state ([place])   . 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 Pulmonary embolism ([place])   . Thyroid disease    hypothyroidism   Patient Active Problem List   Diagnosis Date Noted  . Neuroendocrine carcinoma of stomach ([place]) 07/12/2019    Priority: High  . Elevated Chromogranin 07/02/2019    Priority: High  . 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[place]) 05/12/2019    Priority: High  . Low vitamin B12 level 09/07/2016    Priority: High  . Disability examination 01/17/2015    Priority: High  . Diabetes mellitus type 2 in obese ([place]) 11/28/2010    Priority: High  . PAROXYSMAL ATRIAL FIBRILLATION 07/02/2010    Priority: High  . Primary hypercoagulable state ([place]) 11/30/2007    Priority: High  . Iron deficiency anemia 03/30/2019    Priority: Medium  . Hyperlipidemia associated with type 2 diabetes mellitus ([place]) 03/09/2019    Priority: Medium  . Cough variant asthma 10/14/2018    Priority: Medium  . Neuropathy due to medical condition ([place]) 11/09/2017    Priority: Medium  . Hx of adenomatous polyp of colon 04/10/2016    Priority: Medium  . CKD (chronic kidney disease), stage III 09/05/2014    Priority: Medium  . Hypothyroidism 03/09/2007    Priority: Medium  . Hypertension associated with diabetes ([place]) 03/09/2007    Priority: Medium  . History of Melanoma 03/09/2007    Priority: Medium  . Abnormal findings on esophagogastroduodenoscopy (EGD) 07/02/2019    Priority: Low  . Gastric  adenoma 07/02/2019    Priority: Low  . Multiple gastric polyps 07/02/2019    Priority: Low  . Long term (current) use of anticoagulants - warfarin 06/02/2017    Priority: Low  . Encounter for therapeutic drug monitoring 10/26/2013    Priority: Low  . Left facial pain 09/24/2012    Priority: Low  . Oral mucosal lesion 09/24/2012    Priority: Low  . TRANSIENT DISORDER INITIATING/MAINTAINING SLEEP 04/02/2010    Priority: Low  . Overweight(278.02) 11/29/2009    Priority: Low  . LOW BACK  PAIN 06/15/2008    Priority: Low  . ALLERGIC RHINITIS 03/09/2007    Priority: Low  . GERD 03/09/2007    Priority: Low  . Acquired thrombophilia ([place]) 02/14/2020  . Myalgia due to statin 02/14/2020  . Genetic testing 08/15/2019  . Family history of breast cancer   . Family history of ovarian cancer   . Family history of uterine cancer   . Family history of lung cancer   . Family history of bone cancer   . Family history of testicular cancer   . Vitamin D deficiency 07/20/2019   Past Surgical History:  Procedure Laterality Date  . ABDOMINAL HYSTERECTOMY     noncancerous  . BACK SURGERY  1983  . CHOLECYSTECTOMY    . COLONOSCOPY    . ENDOSCOPIC MUCOSAL RESECTION N/A 07/12/2019   Procedure: ENDOSCOPIC MUCOSAL RESECTION;  Surgeon: Rush Landmark Telford Nab., MD;  Location: [facility];  Service: Gastroenterology;  Laterality: N/A;  . ESOPHAGOGASTRODUODENOSCOPY (EGD) WITH PROPOFOL N/A 07/12/2019   Procedure: ESOPHAGOGASTRODUODENOSCOPY (EGD) WITH PROPOFOL;  Surgeon: Rush Landmark Telford Nab., MD;  Location: [facility];  Service: Gastroenterology;  Laterality: N/A;  . ESOPHAGOGASTRODUODENOSCOPY (EGD) WITH PROPOFOL N/A 10/09/2019   Procedure: ESOPHAGOGASTRODUODENOSCOPY (EGD) WITH PROPOFOL;  Surgeon: Rush Landmark Telford Nab., MD;  Location: [place];  Service: Gastroenterology;  Laterality: N/A;  . EUS N/A 07/12/2019   Procedure: UPPER ENDOSCOPIC ULTRASOUND (EUS) RADIAL;  Surgeon: Rush Landmark Telford Nab., MD;  Location: [facility];  Service: Gastroenterology;  Laterality: N/A;  . EUS N/A 10/09/2019   Procedure: UPPER ENDOSCOPIC ULTRASOUND (EUS) RADIAL;  Surgeon: Rush Landmark Telford Nab., MD;  Location: [place] [facility];  Service: Gastroenterology;  Laterality: N/A;  . fatty tumor breast Right    removed  . FOREIGN BODY REMOVAL  10/09/2019   Procedure: FOREIGN BODY REMOVAL;  Surgeon: Rush Landmark Telford Nab., MD;  Location: [place];  Service: Gastroenterology;;  . HEMOSTASIS CLIP PLACEMENT   07/12/2019   Procedure: HEMOSTASIS CLIP PLACEMENT;  Surgeon: Irving Copas., MD;  Location: [place] [facility];  Service: Gastroenterology;;  . HEMOSTASIS CLIP PLACEMENT  10/09/2019   Procedure: HEMOSTASIS CLIP PLACEMENT;  Surgeon: Irving Copas., MD;  Location: [place];  Service: Gastroenterology;;  . HEMOSTASIS CONTROL  07/12/2019   Procedure: HEMOSTASIS CONTROL;  Surgeon: Irving Copas., MD;  Location: [facility];  Service: Gastroenterology;;  . [name]    . KNEE ARTHROSCOPY Left   . Lymp nodes Right 08/2004  . MELANOMA EXCISION Right 2005   face   . OOPHORECTOMY Bilateral   . POLYPECTOMY  07/12/2019   Procedure: POLYPECTOMY;  Surgeon: Rush Landmark Telford Nab., MD;  Location: [facility];  Service: Gastroenterology;;  . POLYPECTOMY  10/09/2019   Procedure: POLYPECTOMY;  Surgeon: Irving Copas., MD;  Location: [place];  Service: Gastroenterology;;  . removal lymphnodes in right neck    . SUBMUCOSAL LIFTING INJECTION  07/12/2019   Procedure: SUBMUCOSAL LIFTING INJECTION;  Surgeon: Rush Landmark Telford Nab., MD;  Location: [place];  Service: Gastroenterology;;  .  SUBMUCOSAL LIFTING INJECTION  10/09/2019   Procedure: SUBMUCOSAL LIFTING INJECTION;  Surgeon: Rush Landmark Telford Nab., MD;  Location: [facility] ENDOSCOPY;  Service: Gastroenterology;;  . UPPER GASTROINTESTINAL ENDOSCOPY      Family History  Problem Relation Age of Onset  . Coronary artery disease Mother        Mom 25, Dad 57-smoker  . Diabetes Mother   . Ovarian cancer Mother 13       not officially diagnosed  . Hypertension Father   . Diabetes Father   . Mitral valve prolapse Sister   . Diabetes Sister   . Breast cancer Maternal Aunt 84  . Breast cancer Half-Sister 82  . Breast cancer Half-Sister 20  . Uterine cancer Niece 11  . Breast cancer Niece        diagnosed mid-60s  . Lung cancer Nephew   . Bone cancer Nephew 64  . Cancer Niece 65       unknown type  . Alzheimer's  disease Maternal Aunt   . Breast cancer Other        maternal great-aunt, diagnosed in her 18s  . Breast cancer Other        4th degree maternal relative, diagnosed in her 96s  . Colon cancer Neg Hx   . Esophageal cancer Neg Hx   . Pancreatic cancer Neg Hx   . Rectal cancer Neg Hx   . Stomach cancer Neg Hx   . Inflammatory bowel disease Neg Hx   . Liver disease Neg Hx     Medications- reviewed and updated Current Outpatient Medications  Medication Sig Dispense Refill  . acetaminophen (TYLENOL) 500 MG tablet Take 500 mg by mouth every 6 (six) hours as needed (for pain.).     Marland Kitchen Alum Hydroxide-Mag Trisilicate (GAVISCON) [id number] MG CHEW Chew 1-2 tablets by mouth 2 (two) times daily as needed (indigestion).    . Cholecalciferol (VITAMIN D3) 50 MCG (2000 UT) TABS Take 2,000 Units by mouth daily.    Marland Kitchen dicyclomine (BENTYL) 20 MG tablet Take 1 tablet (20 mg total) by mouth every 6 (six) hours as needed for spasms (abdominal cramps, pain). 60 tablet 0  . diltiazem (CARDIZEM CD) 360 MG 24 hr capsule Take 1 capsule by mouth at bedtime 90 capsule 0  . Ferrous Sulfate (IRON) 325 (65 Fe) MG TABS Take 1 tablet (325 mg total) by mouth daily. (Patient taking differently: Take 325 mg by mouth every other day. ) 30 tablet 0  . levothyroxine (SYNTHROID) 100 MCG tablet Take 1 tablet by mouth once daily 90 tablet 0  . lovastatin (MEVACOR) 10 MG tablet Take 1 tablet (10 mg total) by mouth once a week. (Patient taking differently: Take 10 mg by mouth every Monday at 6 PM. ) 13 tablet 3  . metoprolol succinate (TOPROL-XL) 25 MG 24 hr tablet Take 1 tablet by mouth once daily 90 tablet 0  . omeprazole (PRILOSEC) 40 MG capsule Take 1 capsule (40 mg total) by mouth 2 (two) times daily before a meal. Two times daily x 4-weeks and then decrease to once daily x 8-weeks. (Patient not taking: Reported on 11/14/2019) 60 capsule 4  . telmisartan (MICARDIS) 40 MG tablet Take 1 tablet (40 mg total) by mouth daily. 90 tablet 3    . triamterene-hydrochlorothiazide (MAXZIDE-25) 37.5-25 MG tablet Take 1 tablet by mouth once daily 90 tablet 0  . vitamin B-12 (CYANOCOBALAMIN) 1000 MCG tablet Take 1,000 mcg by mouth daily.    Marland Kitchen warfarin (COUMADIN) 5 MG tablet  TAKE AS DIRECTED BY  ANTICOAGULATION  CLINIC 105 tablet 0   No current facility-administered medications for this visit.    Allergies-reviewed and updated Allergies  Allergen Reactions  . Guaifenesin & Derivatives Nausea And Vomiting    Hallucinations  . Iohexol Anaphylaxis, Hives and Shortness Of Breath    Incident 1990, premedicated prior to obtaining IV contrast since.  . Amoxicillin-Pot Clavulanate Swelling and Other (See Comments)    Facial swelling Did it involve swelling of the face/tongue/throat, SOB, or low BP? Yes Did it involve sudden or severe rash/hives, skin peeling, or any reaction on the inside of your mouth or nose? No Did you need to seek medical attention at a hospital or doctor's office? No When did it last happen?~7-8 years ago If all above answers are "NO", may proceed with cephalosporin use.   . Other     Contrast dye    Social History   Social History Narrative   Married 1971. 1 child Johnny Bertin (by [employer]) lives in [place] with 2 grandchildren girls 8,6 in 2015.       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    Objective  Objective:  BP 118/68   Pulse 72   Temp 98.6 F (37 C) (Temporal)   Ht 5\' 4"  (1.626 m)   Wt 191 lb (86.6 kg)   LMP  (LMP Unknown)   SpO2 97%   BMI 32.79 kg/m  Gen: NAD, resting comfortably HEENT: Mucous membranes are moist. Oropharynx normal Neck: no thyromegaly CV: RRR no murmurs rubs or gallops Lungs: CTAB no crackles, wheeze, rhonchi Abdomen: soft/nontender/nondistended/normal bowel sounds. No rebound or guarding.  Ext: no edema Skin: warm, dry Neuro: grossly normal, moves all extremities, PERRLA   Diabetic  Foot Exam - Simple   Simple Foot Form Diabetic Foot exam was performed with the following findings: Yes 02/14/2020 10:49 AM  Visual Inspection No deformities, no ulcerations, no other skin breakdown bilaterally: Yes Sensation Testing Intact to touch and monofilament testing bilaterally: Yes Pulse Check Posterior Tibialis and Dorsalis pulse intact bilaterally: Yes Comments       Assessment and Plan   64 y.o. female presenting for annual physical.  Health Maintenance counseling: 1. Anticipatory guidance: Patient counseled regarding regular dental exams -q6 has not been due to Covid  Months- encouraged to restart, eye exams - up to date due in next few months,  avoiding smoking and second hand smoke , limiting alcohol to 1 beverage per day .  Does not dirink  2. Risk factor reduction:  Advised patient of need for regular exercise and diet rich and fruits and vegetables to reduce risk of heart attack and stroke. Exercise- . Every day rides bike for 1 hr and 15 min each time.  Diet- Working on healthy diet. Avoids dairy. Down 4 lbs since January! Despite loss of husband Wt Readings from Last 3 Encounters:  02/14/20 191 lb (86.6 kg)  11/14/19 192 lb 12.8 oz (87.5 kg)  10/09/19 195 lb (88.5 kg)  3. Immunizations/screenings/ancillary studies- up to date other than shingrix  And pneumovax- delay shingrix at least a month from pneumovax. History of cancer no active cancer- opted out of prevnar 13 as no clear immunosuppressed state- will give pneumovax 23 today  Immunization History  Administered Date(s) Administered  . Influenza Split 06/30/2011, 06/29/2012  . Influenza Whole 07/26/2007, 06/15/2008, 07/25/2009  . Influenza, High Dose Seasonal PF 06/23/2019  .  Influenza,inj,Quad PF,6+ Mos 07/10/2013, 06/13/2014, 06/14/2015, 05/14/2016, 05/27/2017, 07/19/2018  . PFIZER SARS-COV-2 Vaccination 10/14/2019, 11/04/2019  . Pneumococcal Polysaccharide-23 07/26/2014  . Td 09/22/2003  . Tdap 03/18/2018    Health Maintenance Due  Topic Date Due  . OPHTHALMOLOGY EXAM will send for notes  12/02/2016  . DEXA SCAN ordered  Never done  . Pneumovax 23 today 02/18/2019  . FOOT EXAM  Done  03/19/2019  . MAMMOGRAM send for notes.  06/02/2019   4. Cervical cancer screening- Dr. Ronita Hipps has said no more pap smears- hysterectomy benign reasons  5. Breast cancer screening-  breast exam with Dr. Ronita Hipps and mammogram - up to date sending for notes.  6. Colon cancer screening - 05/05/2019 colonoscopy with 5 year repeat planned unless needed sooner with carcinoid history-followed by GI.  7. Skin cancer screening-history of melanoma.advised regular sunscreen use. Denies worrisome, changing, or new skin lesions. Followed by [facility] dermatology.  8. Birth control/STD check-  Declines -hysterectomy and not active 9. Osteoporosis screening at 72- will screen with DEXA -Never smoker  Status of chronic or acute concerns   #social update- missing husband Eduard Clos after loss last year to recurrent cancer. Trying to stay active as helps with emotional control.    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  There was an isolated pancreatic lesion which was thought to be primary carcinoid tumor-ongoing surveillance of stomach and pancreas with Dr. Macie Burows follow-up with Dr. Benay Spice in August.  Chromogranin a level still elevated in February but improving -plan is to try another procedure to try to get pancreatic lesion removed/biopsied -another PET scan also planned -sparing bentyl #Primary hypercoagulable state plus acquired thrombophilia with a fib-patient remains on long-term Coumadin with lupus hypercoagulability syndrome.  Last DVT 1995   #Atrial fibrillation on Coumadin for anticoagulation and rate controlled with diltiazem and metoprolol-continue current medication  #Iron deficiency anemia-patient currently on iron every other dya.  Update iron levels today  #CKD stage III-GFR typically in  50-60 range.  Update with labs today  #Vitamin D deficiency-patient on 50,000 units weekly in past- on 2000 units daily.  Update vitamin D with labs today  # GERD S:controlled with once daily omeprazole 40mg  through GI  A/P: controlled- continue current meds  - has to be off of rthis for chromogranin A test  #hypothyroidism S: compliant On thyroid medication-levothyroxine 100 mcg  Lab Results  Component Value Date   TSH 1.95 07/19/2018   A/P: Hopefully controlled-update TSH with labs today-continue current medication for now  # Diabetes S: Medication:  Diet controlled CBGs- doesn't check sugar Exercise and diet- great job with exercise! Dow 4 lbs Lab Results  Component Value Date   HGBA1C 6.6 (H) 07/27/2019   HGBA1C 7.1 (H) 03/10/2019   HGBA1C 6.7 (H) 07/19/2018   A/P: suspect controlled- update a1c with labs  #hyperlipidemia S: Medication: Lovastatin 10MG -Multiple trials in past- only has been able to tolerate low dose lovastatin weekly .  Lab Results  Component Value Date   CHOL 183 07/27/2019   HDL 51.50 07/27/2019   LDLCALC 102 (H) 07/27/2019   LDLDIRECT 39.0 03/10/2019   TRIG 145.0 07/27/2019   CHOLHDL 4 07/27/2019   A/P: will target LDL 100 or less- update lipid panel. Continue current meds  # B12 deficiency S: Current treatment/medication (oral vs. IM): Vitmamin B-12 1063mcg oral Lab Results  Component Value Date   VITAMINB12 >1500 (H) 09/26/2019  A/P: hopefully controlled with oral b12   #Vitamin D deficiency S: Medication: Vitamin D3 2000  units Last vitamin D Lab Results  Component Value Date   VD25OH 57.12 07/27/2019  A/P: hopefully controlled- update vitamin d with labs   #hypertension S: medication: Micardis 40Mg , Maxzide 37.5-25mg , metoprolol 25mg , Diltiazem 360Mg  Home readings #s: normal BP Readings from Last 3 Encounters:  02/14/20 118/68  11/14/19 120/70  10/09/19 131/77  A/P: Stable. Continue current medications.   Recommended follow up:  Return in about 6 months (around 08/16/2020) for follow up- or sooner if needed. Future Appointments  Date Time Provider [place]  02/21/2020 10:00 AM [facility]  05/14/2020  9:30 AM [facility] LAB 4 [facility] None  05/14/2020 10:00 AM Ladell Pier, MD [facility] None   Lab/Order associations: not fasting- small breakfast bar   ICD-10-CM   1. Preventative health care  Z00.00   2. Primary hypercoagulable state ([place])  D68.59   3. Neuropathy due to medical condition ([place]) - minimal after b12 repletion- only occasionally bothers her- monitor G63 Vitamin B12  4. Hypertension associated with diabetes ([place])  E11.59    I10   5. Hyperlipidemia associated with type 2 diabetes mellitus (HCC)  E11.69    E78.5   6. Diabetes mellitus type 2 in obese (HCC)  E11.69 CBC with Differential/Platelet   E66.9 Comprehensive metabolic panel    Lipid panel    Hemoglobin A1c  7. Acquired thrombophilia ([place])  D68.69   8. Paroxysmal atrial fibrillation (HCC) Chronic I48.0   9. Hypothyroidism, unspecified type  E03.9 TSH  10. Iron deficiency anemia, unspecified iron deficiency anemia type  D50.9 Ferritin  11. Vitamin D deficiency  E55.9 VITAMIN D 25 Hydroxy (Vit-D Deficiency, Fractures)  12. Postmenopausal  Z78.0 DG Bone Density  13. Myalgia due to statin  M79.10    T46.6X5A    Return precautions advised.  Garret Reddish, MD

## 2020-02-14 ENCOUNTER — Ambulatory Visit (INDEPENDENT_AMBULATORY_CARE_PROVIDER_SITE_OTHER): Payer: Medicare Other | Admitting: Family Medicine

## 2020-02-14 ENCOUNTER — Other Ambulatory Visit: Payer: Self-pay

## 2020-02-14 ENCOUNTER — Ambulatory Visit: Payer: Medicare Other

## 2020-02-14 ENCOUNTER — Encounter: Payer: Self-pay | Admitting: Family Medicine

## 2020-02-14 ENCOUNTER — Ambulatory Visit: Payer: Medicare Other | Admitting: Family Medicine

## 2020-02-14 VITALS — BP 118/68 | HR 72 | Temp 98.6°F | Ht 64.0 in | Wt 191.0 lb

## 2020-02-14 DIAGNOSIS — E119 Type 2 diabetes mellitus without complications: Secondary | ICD-10-CM

## 2020-02-14 DIAGNOSIS — T466X5A Adverse effect of antihyperlipidemic and antiarteriosclerotic drugs, initial encounter: Secondary | ICD-10-CM

## 2020-02-14 DIAGNOSIS — E559 Vitamin D deficiency, unspecified: Secondary | ICD-10-CM | POA: Diagnosis not present

## 2020-02-14 DIAGNOSIS — E669 Obesity, unspecified: Secondary | ICD-10-CM | POA: Diagnosis not present

## 2020-02-14 DIAGNOSIS — Z780 Asymptomatic menopausal state: Secondary | ICD-10-CM

## 2020-02-14 DIAGNOSIS — E039 Hypothyroidism, unspecified: Secondary | ICD-10-CM | POA: Diagnosis not present

## 2020-02-14 DIAGNOSIS — E785 Hyperlipidemia, unspecified: Secondary | ICD-10-CM

## 2020-02-14 DIAGNOSIS — E1159 Type 2 diabetes mellitus with other circulatory complications: Secondary | ICD-10-CM

## 2020-02-14 DIAGNOSIS — Z0000 Encounter for general adult medical examination without abnormal findings: Secondary | ICD-10-CM

## 2020-02-14 DIAGNOSIS — Z23 Encounter for immunization: Secondary | ICD-10-CM

## 2020-02-14 DIAGNOSIS — D6859 Other primary thrombophilia: Secondary | ICD-10-CM

## 2020-02-14 DIAGNOSIS — G63 Polyneuropathy in diseases classified elsewhere: Secondary | ICD-10-CM | POA: Diagnosis not present

## 2020-02-14 DIAGNOSIS — I10 Essential (primary) hypertension: Secondary | ICD-10-CM

## 2020-02-14 DIAGNOSIS — D509 Iron deficiency anemia, unspecified: Secondary | ICD-10-CM | POA: Diagnosis not present

## 2020-02-14 DIAGNOSIS — D6869 Other thrombophilia: Secondary | ICD-10-CM

## 2020-02-14 DIAGNOSIS — M791 Myalgia, unspecified site: Secondary | ICD-10-CM

## 2020-02-14 DIAGNOSIS — E1169 Type 2 diabetes mellitus with other specified complication: Secondary | ICD-10-CM

## 2020-02-14 DIAGNOSIS — I480 Paroxysmal atrial fibrillation: Secondary | ICD-10-CM

## 2020-02-14 DIAGNOSIS — I152 Hypertension secondary to endocrine disorders: Secondary | ICD-10-CM

## 2020-02-14 LAB — CBC WITH DIFFERENTIAL/PLATELET
Basophils Absolute: 0.1 10*3/uL (ref 0.0–0.1)
Basophils Relative: 1.3 % (ref 0.0–3.0)
Eosinophils Absolute: 0.3 10*3/uL (ref 0.0–0.7)
Eosinophils Relative: 4.5 % (ref 0.0–5.0)
HCT: 37.4 % (ref 36.0–46.0)
Hemoglobin: 12.6 g/dL (ref 12.0–15.0)
Lymphocytes Relative: 27 % (ref 12.0–46.0)
Lymphs Abs: 2.1 10*3/uL (ref 0.7–4.0)
MCHC: 33.7 g/dL (ref 30.0–36.0)
MCV: 82.6 fl (ref 78.0–100.0)
Monocytes Absolute: 0.6 10*3/uL (ref 0.1–1.0)
Monocytes Relative: 7.7 % (ref 3.0–12.0)
Neutro Abs: 4.6 10*3/uL (ref 1.4–7.7)
Neutrophils Relative %: 59.5 % (ref 43.0–77.0)
Platelets: 225 10*3/uL (ref 150.0–400.0)
RBC: 4.53 Mil/uL (ref 3.87–5.11)
RDW: 14.7 % (ref 11.5–15.5)
WBC: 7.7 10*3/uL (ref 4.0–10.5)

## 2020-02-14 LAB — COMPREHENSIVE METABOLIC PANEL
ALT: 18 U/L (ref 0–35)
AST: 18 U/L (ref 0–37)
Albumin: 4.8 g/dL (ref 3.5–5.2)
Alkaline Phosphatase: 69 U/L (ref 39–117)
BUN: 22 mg/dL (ref 6–23)
CO2: 30 mEq/L (ref 19–32)
Calcium: 9.5 mg/dL (ref 8.4–10.5)
Chloride: 100 mEq/L (ref 96–112)
Creatinine, Ser: 1.45 mg/dL — ABNORMAL HIGH (ref 0.40–1.20)
GFR: 36.13 mL/min — ABNORMAL LOW (ref >60.00)
Glucose, Bld: 99 mg/dL (ref 70–99)
Potassium: 4.2 mEq/L (ref 3.5–5.1)
Sodium: 139 mEq/L (ref 135–145)
Total Bilirubin: 0.8 mg/dL (ref 0.2–1.2)
Total Protein: 7.2 g/dL (ref 6.0–8.3)

## 2020-02-14 LAB — TSH: TSH: 2.01 u[IU]/mL (ref 0.35–4.50)

## 2020-02-14 LAB — HEMOGLOBIN A1C: Hgb A1c MFr Bld: 6.3 % (ref 4.6–6.5)

## 2020-02-14 LAB — LIPID PANEL
Cholesterol: 164 mg/dL (ref 0–200)
HDL: 57.2 mg/dL (ref >39.00)
LDL Cholesterol: 79 mg/dL (ref 0–99)
NonHDL: 107.22
Total CHOL/HDL Ratio: 3
Triglycerides: 139 mg/dL (ref 0.0–149.0)
VLDL: 27.8 mg/dL (ref 0.0–40.0)

## 2020-02-14 LAB — FERRITIN: Ferritin: 8.5 ng/mL — ABNORMAL LOW (ref 10.0–291.0)

## 2020-02-14 LAB — VITAMIN B12: Vitamin B-12: >1526 pg/mL — ABNORMAL HIGH (ref 211–911)

## 2020-02-14 LAB — VITAMIN D 25 HYDROXY (VIT D DEFICIENCY, FRACTURES): VITD: 57.86 ng/mL (ref 30.00–100.00)

## 2020-02-14 NOTE — Progress Notes (Signed)
Phone: [phone]    Subjective:  Patient presents today for their annual wellness visit (subsequent )  Preventive Screening-Counseling & Management  Modifiable Risk Factors/behavioral risk assessment/psychosocial risk assessment Regular exercise: biking an hour and 15 mins seven days a week- built up slowly Diet: down 4 lbs- trying to eat healthier  Wt Readings from Last 3 Encounters:  02/14/20 191 lb (86.6 kg)  11/14/19 192 lb 12.8 oz (87.5 kg)  10/09/19 195 lb (88.5 kg)   Smoking Status: Never Smoker Second Hand Smoking status: No smokers in home Alcohol intake: 0 per week  Cardiac risk factors:  advanced age (older than 43 for men, 64 for women)  treated Hyperlipidemia  treated Hypertension  Treated diabetes.  Lab Results  Component Value Date   HGBA1C 6.6 (H) 07/27/2019  Family History: Mom and dad with CAD   Depression Screen/risk evaluation Risk factors: widowed from tragic loss of husband Charlie to recurrent cancer. PHQ9 of 1 .  Depression screen [facility] 2/9 02/14/2020 03/09/2019 07/19/2018 12/23/2016  Decreased Interest 0 0 0 2  Down, Depressed, Hopeless 0 0 0 0  PHQ - 2 Score 0 0 0 2  Altered sleeping 1 1 - 1  Tired, decreased energy 0 0 - 2  Change in appetite 0 0 - 0  Feeling bad or failure about yourself  0 0 - 1  Trouble concentrating 0 0 - 1  Moving slowly or fidgety/restless 0 0 - 0  Suicidal thoughts 0 0 - 0  PHQ-9 Score 1 1 - 7  Difficult doing work/chores Not difficult at all Not difficult at all - Somewhat difficult  Some recent data might be hidden    Functional ability and level of safety Mobility assessment:  timed get up and go <12 seconds Activities of Daily Living- Independent in ADLs (toileting, bathing, dressing, transferring, eating) and in IADLs (shopping, housekeeping, managing own medications, and handling finances) Home Safety: Loose rugs (no), smoke detectors (up to date), small pets ( none), grab bars (yes plus has walk in), stairs (2  to get into home but no issues), life-alert system (would use cell or house phone) Hearing Difficulties: -patient declines Fall Risk: None  Fall Risk  02/14/2020 03/09/2019 08/20/2017 02/11/2017 12/23/2016  Falls in the past year? 0 0 No No No  Number falls in past yr: 0 - - - -  Injury with Fall? 0 - - - -   Opioid use history:  no long term opioids use Self assessment of health status: "good"  Cognitive Testing             No reported trouble.   Mini cog: normal clock draw. 2/3 delayed recall. Normal test result   List the Names of Other Physician/Practitioners you currently use: Patient Care Team: Marin Olp, MD as PCP - General (Family Medicine) Gatha Mayer, MD as Consulting Physician (Gastroenterology) Mansouraty, Telford Nab., MD as Consulting Physician (Gastroenterology) Ladell Pier, MD as Consulting Physician (Oncology) Harriett Sine, MD as Consulting Physician (Dermatology)   Required Immunizations needed today:   Pneumovax 23 today. shingrix 1 month out at pharmacy Immunization History  Administered Date(s) Administered  . Influenza Split 06/30/2011, 06/29/2012  . Influenza Whole 07/26/2007, 06/15/2008, 07/25/2009  . Influenza, High Dose Seasonal PF 06/23/2019  . Influenza,inj,Quad PF,6+ Mos 07/10/2013, 06/13/2014, 06/14/2015, 05/14/2016, 05/27/2017, 07/19/2018  . PFIZER SARS-COV-2 Vaccination 10/14/2019, 11/04/2019  . Pneumococcal Polysaccharide-23 07/26/2014  . Td 09/22/2003  . Tdap 03/18/2018   Health Maintenance  Topic Date Due  .  Eye exam for diabetics  12/02/2016  . DEXA scan (bone density measurement)  Never done  . Mammogram  06/02/2019  . Hemoglobin A1C  01/24/2020  . Flu Shot  04/21/2020  . Complete foot exam   02/13/2021  . Colon Cancer Screening  05/04/2024  . Tetanus Vaccine  03/18/2028  . COVID-19 Vaccine  Completed  .  Hepatitis C: One time screening is recommended by Center for Disease Control  (CDC) for  adults born from [birth year]  through 1965.   Completed  . HIV Screening  Completed  . Pneumonia vaccines  Discontinued    Screening tests-  1. Colon cancer screening-  Up to date 2020- repeat 5 years 2. Lung Cancer screening- not a candidate 3. Skin cancer screening- with Dr. Elvera Lennox due to melanoma history 4. Cervical cancer screening- no repeat per Dr. Ronita Hipps 5. Breast cancer screening- up to date- need to get records sent to [place] 6. dexa ordered today  The following were reviewed and entered/updated in epic if appropriate: Past Medical History:  Diagnosis Date  . Allergy   . Anxiety   . Arthritis   . Asthma    many years ago  . Blood transfusion without reported diagnosis   . Cataract   . Clotting disorder ([place])   . Depression    Husband died 23-Jun-2019  . Diabetes ([place])    not on medication  . Dysrhythmia   . Family history of bone cancer   . Family history of breast cancer   . Family history of lung cancer   . Family history of ovarian cancer   . Family history of testicular cancer   . Family history of uterine cancer   . GERD (gastroesophageal reflux disease)   . Hypertension   . Hypothyroidism   . Iron deficiency anemia 03/30/2019  . Irregular heart beat    tachycardia  . Lupus anticoagulant with hypercoagulable state ([place])   . Melanoma ([place])    2005- face   . Pneumonia 2019  . Primary hypercoagulable state ([place])   . 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 Pulmonary embolism ([place])   . Thyroid disease    hypothyroidism   Patient Active Problem List   Diagnosis Date Noted  . Neuroendocrine carcinoma of stomach ([place]) 07/12/2019    Priority: High  . Elevated Chromogranin 07/02/2019    Priority: High  . 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[place]) 05/12/2019    Priority: High  . Low vitamin B12 level 09/07/2016    Priority: High  . Disability examination 01/17/2015    Priority: High  . Diabetes mellitus type 2 in obese  ([place]) 11/28/2010    Priority: High  . PAROXYSMAL ATRIAL FIBRILLATION 07/02/2010    Priority: High  . Primary hypercoagulable state ([place]) 11/30/2007    Priority: High  . Iron deficiency anemia 03/30/2019    Priority: Medium  . Hyperlipidemia associated with type 2 diabetes mellitus ([place]) 03/09/2019    Priority: Medium  . Cough variant asthma 10/14/2018    Priority: Medium  . Neuropathy due to medical condition ([place]) 11/09/2017    Priority: Medium  . Hx of adenomatous polyp of colon 04/10/2016    Priority: Medium  . CKD (chronic kidney disease), stage III 09/05/2014    Priority: Medium  . Hypothyroidism 03/09/2007    Priority: Medium  . Hypertension associated with diabetes ([place]) 03/09/2007    Priority: Medium  . History of Melanoma 03/09/2007    Priority:  Medium  . Abnormal findings on esophagogastroduodenoscopy (EGD) 07/02/2019    Priority: Low  . 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    Priority: Low  . Multiple gastric polyps 07/02/2019    Priority: Low  . Long term (current) use of anticoagulants - warfarin 06/02/2017    Priority: Low  . Encounter for therapeutic drug monitoring 10/26/2013    Priority: Low  . Left facial pain 09/24/2012    Priority: Low  . Oral mucosal lesion 09/24/2012    Priority: Low  . TRANSIENT DISORDER INITIATING/MAINTAINING SLEEP 04/02/2010    Priority: Low  . Overweight(278.02) 11/29/2009    Priority: Low  . LOW BACK PAIN 06/15/2008    Priority: Low  . ALLERGIC RHINITIS 03/09/2007    Priority: Low  . GERD 03/09/2007    Priority: Low  . Acquired thrombophilia ([place]) 02/14/2020  . Myalgia due to statin 02/14/2020  . Genetic testing 08/15/2019  . Family history of breast cancer   . Family history of ovarian cancer   . Family history of uterine cancer   . Family history of lung cancer   . Family history of bone cancer   . Family history of testicular cancer   . Vitamin D deficiency 07/20/2019   Past Surgical History:  Procedure Laterality Date    . ABDOMINAL HYSTERECTOMY     noncancerous  . BACK SURGERY  1983  . CHOLECYSTECTOMY    . COLONOSCOPY    . ENDOSCOPIC MUCOSAL RESECTION N/A 07/12/2019   Procedure: ENDOSCOPIC MUCOSAL RESECTION;  Surgeon: Rush Landmark Telford Nab., MD;  Location: [facility];  Service: Gastroenterology;  Laterality: N/A;  . ESOPHAGOGASTRODUODENOSCOPY (EGD) WITH PROPOFOL N/A 07/12/2019   Procedure: ESOPHAGOGASTRODUODENOSCOPY (EGD) WITH PROPOFOL;  Surgeon: Rush Landmark Telford Nab., MD;  Location: [facility];  Service: Gastroenterology;  Laterality: N/A;  . ESOPHAGOGASTRODUODENOSCOPY (EGD) WITH PROPOFOL N/A 10/09/2019   Procedure: ESOPHAGOGASTRODUODENOSCOPY (EGD) WITH PROPOFOL;  Surgeon: Rush Landmark Telford Nab., MD;  Location: [place];  Service: Gastroenterology;  Laterality: N/A;  . EUS N/A 07/12/2019   Procedure: UPPER ENDOSCOPIC ULTRASOUND (EUS) RADIAL;  Surgeon: Rush Landmark Telford Nab., MD;  Location: [place] [facility];  Service: Gastroenterology;  Laterality: N/A;  . EUS N/A 10/09/2019   Procedure: UPPER ENDOSCOPIC ULTRASOUND (EUS) RADIAL;  Surgeon: Rush Landmark Telford Nab., MD;  Location: [place];  Service: Gastroenterology;  Laterality: N/A;  . fatty tumor breast Right    removed  . FOREIGN BODY REMOVAL  10/09/2019   Procedure: FOREIGN BODY REMOVAL;  Surgeon: Rush Landmark Telford Nab., MD;  Location: [place];  Service: Gastroenterology;;  . HEMOSTASIS CLIP PLACEMENT  07/12/2019   Procedure: HEMOSTASIS CLIP PLACEMENT;  Surgeon: Irving Copas., MD;  Location: [place];  Service: Gastroenterology;;  . HEMOSTASIS CLIP PLACEMENT  10/09/2019   Procedure: HEMOSTASIS CLIP PLACEMENT;  Surgeon: Irving Copas., MD;  Location: [place];  Service: Gastroenterology;;  . HEMOSTASIS CONTROL  07/12/2019   Procedure: HEMOSTASIS CONTROL;  Surgeon: Irving Copas., MD;  Location: [facility];  Service: Gastroenterology;;  . [name]    . KNEE ARTHROSCOPY Left   . Lymp nodes Right  08/2004  . MELANOMA EXCISION Right 2005   face   . OOPHORECTOMY Bilateral   . POLYPECTOMY  07/12/2019   Procedure: POLYPECTOMY;  Surgeon: Rush Landmark Telford Nab., MD;  Location: [place];  Service: Gastroenterology;;  . POLYPECTOMY  10/09/2019   Procedure: POLYPECTOMY;  Surgeon: Irving Copas., MD;  Location: [place];  Service: Gastroenterology;;  . removal lymphnodes in right neck    . SUBMUCOSAL LIFTING INJECTION  07/12/2019  Procedure: SUBMUCOSAL LIFTING INJECTION;  Surgeon: Irving Copas., MD;  Location: [place];  Service: Gastroenterology;;  . Lia Foyer LIFTING INJECTION  10/09/2019   Procedure: SUBMUCOSAL LIFTING INJECTION;  Surgeon: Irving Copas., MD;  Location[facility] ENDOSCOPY;  Service: Gastroenterology;;  . UPPER GASTROINTESTINAL ENDOSCOPY      Family History  Problem Relation Age of Onset  . Coronary artery disease Mother        Mom 89, Dad 57-smoker  . Diabetes Mother   . Ovarian cancer Mother 66       not officially diagnosed  . Hypertension Father   . Diabetes Father   . Mitral valve prolapse Sister   . Diabetes Sister   . Breast cancer Maternal Aunt 84  . Breast cancer Half-Sister 35  . Breast cancer Half-Sister 25  . Uterine cancer Niece 27  . Breast cancer Niece        diagnosed mid-60s  . Lung cancer Nephew   . Bone cancer Nephew 64  . Cancer Niece 107       unknown type  . Alzheimer's disease Maternal Aunt   . Breast cancer Other        maternal great-aunt, diagnosed in her 66s  . Breast cancer Other        4th degree maternal relative, diagnosed in her 6s  . Colon cancer Neg Hx   . Esophageal cancer Neg Hx   . Pancreatic cancer Neg Hx   . Rectal cancer Neg Hx   . Stomach cancer Neg Hx   . Inflammatory bowel disease Neg Hx   . Liver disease Neg Hx     Medications- reviewed and updated Current Outpatient Medications  Medication Sig Dispense Refill  . acetaminophen (TYLENOL) 500 MG tablet Take 500 mg by  mouth every 6 (six) hours as needed (for pain.).     Marland Kitchen Alum Hydroxide-Mag Trisilicate (GAVISCON) [id number] MG CHEW Chew 1-2 tablets by mouth 2 (two) times daily as needed (indigestion).    . Cholecalciferol (VITAMIN D3) 50 MCG (2000 UT) TABS Take 2,000 Units by mouth daily.    Marland Kitchen dicyclomine (BENTYL) 20 MG tablet Take 1 tablet (20 mg total) by mouth every 6 (six) hours as needed for spasms (abdominal cramps, pain). 60 tablet 0  . diltiazem (CARDIZEM CD) 360 MG 24 hr capsule Take 1 capsule by mouth at bedtime 90 capsule 0  . Ferrous Sulfate (IRON) 325 (65 Fe) MG TABS Take 1 tablet (325 mg total) by mouth daily. (Patient taking differently: Take 325 mg by mouth every other day. ) 30 tablet 0  . levothyroxine (SYNTHROID) 100 MCG tablet Take 1 tablet by mouth once daily 90 tablet 0  . lovastatin (MEVACOR) 10 MG tablet Take 1 tablet (10 mg total) by mouth once a week. (Patient taking differently: Take 10 mg by mouth every Monday at 6 PM. ) 13 tablet 3  . metoprolol succinate (TOPROL-XL) 25 MG 24 hr tablet Take 1 tablet by mouth once daily 90 tablet 0  . omeprazole (PRILOSEC) 40 MG capsule Take 1 capsule (40 mg total) by mouth 2 (two) times daily before a meal. Two times daily x 4-weeks and then decrease to once daily x 8-weeks. (Patient not taking: Reported on 11/14/2019) 60 capsule 4  . telmisartan (MICARDIS) 40 MG tablet Take 1 tablet (40 mg total) by mouth daily. 90 tablet 3  . triamterene-hydrochlorothiazide (MAXZIDE-25) 37.5-25 MG tablet Take 1 tablet by mouth once daily 90 tablet 0  . vitamin B-12 (CYANOCOBALAMIN) 1000  MCG tablet Take 1,000 mcg by mouth daily.    Marland Kitchen warfarin (COUMADIN) 5 MG tablet TAKE AS DIRECTED BY  ANTICOAGULATION  CLINIC 105 tablet 0   No current facility-administered medications for this visit.    Allergies-reviewed and updated Allergies  Allergen Reactions  . Guaifenesin & Derivatives Nausea And Vomiting    Hallucinations  . Iohexol Anaphylaxis, Hives and Shortness Of Breath     Incident 1990, premedicated prior to obtaining IV contrast since.  . Amoxicillin-Pot Clavulanate Swelling and Other (See Comments)    Facial swelling Did it involve swelling of the face/tongue/throat, SOB, or low BP? Yes Did it involve sudden or severe rash/hives, skin peeling, or any reaction on the inside of your mouth or nose? No Did you need to seek medical attention at a hospital or doctor's office? No When did it last happen?~7-8 years ago If all above answers are "NO", may proceed with cephalosporin use.   . Other     Contrast dye    Social History   Socioeconomic History  . Marital status: Widowed    Spouse name: Not on file  . Number of children: Not on file  . Years of education: Not on file  . Highest education level: Not on file  Occupational History  . Not on file  Tobacco Use  . Smoking status: Never Smoker  . Smokeless tobacco: Never Used  Substance and Sexual Activity  . Alcohol use: No    Alcohol/week: 0.0 standard drinks    Comment: no   . Drug use: No  . Sexual activity: Yes  Other Topics Concern  . Not on file  Social History Narrative   Married 1971. 1 child Tommy Isackson (by [employer]) lives in [place] with 2 grandchildren girls 8,6 in 2015.       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    Social Determinants of Health   Financial Resource Strain:   . Difficulty of Paying Living Expenses:   Food Insecurity:   . Worried About Charity fundraiser in the Last Year:   . Arboriculturist in the Last Year:   Transportation Needs:   . Film/video editor (Medical):   Marland Kitchen Lack of Transportation (Non-Medical):   Physical Activity:   . Days of Exercise per Week:   . Minutes of Exercise per Session:   Stress:   . Feeling of Stress :   Social Connections:   . Frequency of Communication with Friends and Family:   . Frequency of Social Gatherings with Friends and Family:    . Attends Religious Services:   . Active Member of Clubs or Organizations:   . Attends Archivist Meetings:   Marland Kitchen Marital Status:       Objective:  BP 118/68   Pulse 72   Temp 98.6 F (37 C) (Temporal)   Ht 5\' 4"  (1.626 m)   Wt 191 lb (86.6 kg)   LMP  (LMP Unknown)   SpO2 97%   BMI 32.79 kg/m  Gen: NAD, resting comfortably   Assessment/Plan:  AWV completed 1. Educated, counseled and referred based on above elements 2. Educated, counseled and referred as appropriate for preventative needs 3. Discussed and documented a written plan for preventiative services and screenings with personalized health advice- After Visit Summary was given to patient which included this plan   Status of chronic or acute concerns  See  physical report  Recommended follow up: Return in about 6 months (around 08/16/2020) for follow up- or sooner if needed. 1 year awv Future Appointments  Date Time Provider [place]  02/21/2020 10:00 AM [facility]  05/14/2020  9:30 AM [facility] LAB 4 [facility] None  05/14/2020 10:00 AM Ladell Pier, MD [facility] None     Lab/Order associations:   ICD-10-CM   1. Preventative health care  Z00.00    Return precautions advised.  Garret Reddish, MD

## 2020-02-14 NOTE — Addendum Note (Signed)
Addended by: Marian Sorrow on: 02/14/2020 12:12 PM   Modules accepted: Orders

## 2020-02-15 ENCOUNTER — Other Ambulatory Visit: Payer: Self-pay

## 2020-02-15 DIAGNOSIS — N183 Chronic kidney disease, stage 3 unspecified: Secondary | ICD-10-CM

## 2020-02-15 DIAGNOSIS — Z780 Asymptomatic menopausal state: Secondary | ICD-10-CM

## 2020-02-21 ENCOUNTER — Ambulatory Visit (INDEPENDENT_AMBULATORY_CARE_PROVIDER_SITE_OTHER): Payer: Medicare Other | Admitting: General Practice

## 2020-02-21 ENCOUNTER — Other Ambulatory Visit: Payer: Self-pay

## 2020-02-21 DIAGNOSIS — Z7901 Long term (current) use of anticoagulants: Secondary | ICD-10-CM | POA: Diagnosis not present

## 2020-02-21 DIAGNOSIS — D6859 Other primary thrombophilia: Secondary | ICD-10-CM

## 2020-02-21 LAB — POCT INR: INR: 3 (ref 2.0–3.0)

## 2020-02-21 NOTE — Progress Notes (Signed)
I have reviewed and agree with note, evaluation, plan.   Temisha Murley, MD  

## 2020-02-21 NOTE — Patient Instructions (Addendum)
Pre visit review using our clinic review tool, if applicable. No additional management support is needed unless otherwise documented below in the visit note.  Continue to take 1 tablet daily except 1/2 tablet on Mondays.  Re-check in 6 weeks.  

## 2020-02-23 ENCOUNTER — Other Ambulatory Visit: Payer: Self-pay

## 2020-02-23 ENCOUNTER — Ambulatory Visit (INDEPENDENT_AMBULATORY_CARE_PROVIDER_SITE_OTHER)
Admission: RE | Admit: 2020-02-23 | Discharge: 2020-02-23 | Disposition: A | Discharge status: Home / Self Care | Payer: Medicare Other | Source: Ambulatory Visit | Attending: Family Medicine | Admitting: Family Medicine

## 2020-02-23 DIAGNOSIS — Z780 Asymptomatic menopausal state: Secondary | ICD-10-CM | POA: Diagnosis not present

## 2020-02-26 ENCOUNTER — Encounter: Payer: Self-pay | Admitting: Family Medicine

## 2020-02-26 DIAGNOSIS — M8580 Other specified disorders of bone density and structure, unspecified site: Secondary | ICD-10-CM | POA: Insufficient documentation

## 2020-02-27 ENCOUNTER — Telehealth: Payer: Self-pay | Admitting: Family Medicine

## 2020-02-27 NOTE — Telephone Encounter (Signed)
Pt spoke to [place] see lab notes for information.

## 2020-02-27 NOTE — Telephone Encounter (Signed)
Patient is returning Joellen's call about bone density results.

## 2020-03-05 ENCOUNTER — Other Ambulatory Visit: Payer: Self-pay | Admitting: Family Medicine

## 2020-03-15 ENCOUNTER — Other Ambulatory Visit: Payer: Self-pay | Admitting: Family Medicine

## 2020-03-15 ENCOUNTER — Other Ambulatory Visit (INDEPENDENT_AMBULATORY_CARE_PROVIDER_SITE_OTHER): Payer: Medicare Other

## 2020-03-15 ENCOUNTER — Other Ambulatory Visit: Payer: Self-pay

## 2020-03-15 DIAGNOSIS — N183 Chronic kidney disease, stage 3 unspecified: Secondary | ICD-10-CM

## 2020-03-15 LAB — BASIC METABOLIC PANEL
BUN: 18 mg/dL (ref 6–23)
CO2: 30 mEq/L (ref 19–32)
Calcium: 9 mg/dL (ref 8.4–10.5)
Chloride: 101 mEq/L (ref 96–112)
Creatinine, Ser: 1.15 mg/dL (ref 0.40–1.20)
GFR: 47.2 mL/min — ABNORMAL LOW (ref >60.00)
Glucose, Bld: 91 mg/dL (ref 70–99)
Potassium: 3.6 mEq/L (ref 3.5–5.1)
Sodium: 139 mEq/L (ref 135–145)

## 2020-03-15 NOTE — Addendum Note (Signed)
Addended by: Doran Clay A on: 03/15/2020 09:50 AM   Modules accepted: Orders

## 2020-03-27 ENCOUNTER — Telehealth: Payer: Self-pay | Admitting: Gastroenterology

## 2020-03-27 NOTE — Telephone Encounter (Signed)
Pt returned call. Pt has been informed of appointment with Dr.Mansouraty for 04/18/20 @ 2:30pm. Pt was also advised that we did have a recall in for return office visit, but there had been some changes to the schedule due to Dr. Rush Landmark being out of the office with his new born son. Pt states that she understands and will keep appointment in July.

## 2020-03-27 NOTE — Telephone Encounter (Signed)
Called and left message on ans mach asking for return call. I have ask pt to ask for me when returning call vs a message being taking. Appointment has been made for 04/18/20 @ 2:30pm with Dr. Rush Landmark- just need to confirm that with pt.

## 2020-03-27 NOTE — Telephone Encounter (Signed)
I assured her that we have not forgotten her and we will be reaching out to her when we have a day to offer her. She was very angry when I offered an appointment with [facility], she declined. Only wants to see Dr Rush Landmark for the procedure she needs. I reassured her many times that we have not forgotten her and will reach out once we have a date for her.

## 2020-03-31 ENCOUNTER — Other Ambulatory Visit: Payer: Self-pay | Admitting: Family Medicine

## 2020-04-03 ENCOUNTER — Other Ambulatory Visit: Payer: Self-pay

## 2020-04-03 ENCOUNTER — Ambulatory Visit (INDEPENDENT_AMBULATORY_CARE_PROVIDER_SITE_OTHER): Payer: Medicare Other | Admitting: General Practice

## 2020-04-03 DIAGNOSIS — D6859 Other primary thrombophilia: Secondary | ICD-10-CM | POA: Diagnosis not present

## 2020-04-03 DIAGNOSIS — Z7901 Long term (current) use of anticoagulants: Secondary | ICD-10-CM

## 2020-04-03 LAB — POCT INR: INR: 2.8 (ref 2.0–3.0)

## 2020-04-03 NOTE — Progress Notes (Signed)
I have reviewed and agree with note, evaluation, plan.   Chavonne Sforza, MD  

## 2020-04-03 NOTE — Patient Instructions (Signed)
Pre visit review using our clinic review tool, if applicable. No additional management support is needed unless otherwise documented below in the visit note.  Continue to take 1 tablet daily except 1/2 tablet on Mondays.  Re-check in 6 weeks.  

## 2020-04-13 ENCOUNTER — Other Ambulatory Visit: Payer: Self-pay | Admitting: Family Medicine

## 2020-04-18 ENCOUNTER — Encounter: Payer: Self-pay | Admitting: Gastroenterology

## 2020-04-18 ENCOUNTER — Telehealth: Payer: Self-pay

## 2020-04-18 ENCOUNTER — Ambulatory Visit: Payer: Medicare Other | Admitting: Gastroenterology

## 2020-04-18 VITALS — BP 104/70 | HR 76 | Ht 64.0 in | Wt 196.0 lb

## 2020-04-18 DIAGNOSIS — K317 Polyp of stomach and duodenum: Secondary | ICD-10-CM

## 2020-04-18 DIAGNOSIS — R198 Other specified symptoms and signs involving the digestive system and abdomen: Secondary | ICD-10-CM | POA: Diagnosis not present

## 2020-04-18 DIAGNOSIS — K3189 Other diseases of stomach and duodenum: Secondary | ICD-10-CM | POA: Diagnosis not present

## 2020-04-18 DIAGNOSIS — R9389 Abnormal findings on diagnostic imaging of other specified body structures: Secondary | ICD-10-CM

## 2020-04-18 DIAGNOSIS — R899 Unspecified abnormal finding in specimens from other organs, systems and tissues: Secondary | ICD-10-CM

## 2020-04-18 DIAGNOSIS — K31A0 Gastric intestinal metaplasia, unspecified: Secondary | ICD-10-CM

## 2020-04-18 DIAGNOSIS — C7A8 Other malignant neuroendocrine tumors: Secondary | ICD-10-CM | POA: Diagnosis not present

## 2020-04-18 DIAGNOSIS — K869 Disease of pancreas, unspecified: Secondary | ICD-10-CM | POA: Diagnosis not present

## 2020-04-18 NOTE — Patient Instructions (Addendum)
Stop your Omeprazole for 2 weeks ( starting Monday 04/22/20) then come in for labs. (Chromograin A).   If you are age 66 or older, your body mass index should be between 23-30. Your Body mass index is 33.64 kg/m. If this is out of the aforementioned range listed, please consider follow up with your Primary Care Provider.  If you are age 75 or younger, your body mass index should be between 19-25. Your Body mass index is 33.64 kg/m. If this is out of the aformentioned range listed, please consider follow up with your Primary Care Provider.    Your provider has requested that you go to the basement level for lab work (05/06/20). Press "B" on the elevator. The lab is located at the first door on the left as you exit the elevator.   You have been scheduled for an endoscopy. Please follow written instructions given to you at your visit today. If you use inhalers (even only as needed), please bring them with you on the day of your procedure.   You will be contaced by our office prior to your procedure for directions on holding your Coumadin/Warfarin.  If you do not hear from our office 1 week prior to your scheduled procedure, please call [phone] to discuss.   Thank you for choosing me and [facility].  Dr. Rush Landmark

## 2020-04-18 NOTE — Telephone Encounter (Signed)
Request for surgical clearance:     Endoscopy Procedure  What type of surgery is being performed?     EUS  When is this surgery scheduled?     06/19/20  What type of clearance is required ?   Pharmacy  Are there any medications that need to be held prior to surgery and how long? Warfarin for 5 days before procedure. Pt will need Lovenox Bridge.   Practice name and name of physician performing surgery?      [facility]-Dr. Rush Landmark   What is your office phone and fax number?      Phone- [phone]  [phone]  Anesthesia type (None, local, MAC, general) ?       MAC

## 2020-04-18 NOTE — Telephone Encounter (Signed)
Cindy-can you set her up for a Lovenox bridge.  I am okay with her holding Coumadin with Lovenox bridge

## 2020-04-21 DIAGNOSIS — K869 Disease of pancreas, unspecified: Secondary | ICD-10-CM | POA: Insufficient documentation

## 2020-04-21 DIAGNOSIS — K31A0 Gastric intestinal metaplasia, unspecified: Secondary | ICD-10-CM | POA: Insufficient documentation

## 2020-04-21 DIAGNOSIS — R9389 Abnormal findings on diagnostic imaging of other specified body structures: Secondary | ICD-10-CM | POA: Insufficient documentation

## 2020-04-21 NOTE — Progress Notes (Signed)
[facility] VISIT   Primary Care Provider [address], [place] [address] [phone]  Referring Provider Dr. Carlean Purl & Dr. Ammie Dalton  Patient Profile: Erin Kirk is a 64 y.o. female with a pmh significant for GERD, asthma, arthritis, MDD/anxiety, lupus anticoagulant with hypercoagulable state (on anticoagulation), prior VTE/PE, hypothyroidism, gastric adenomas, gastric neuroendocrine tumors, pancreatic lesion (positive on NET dotatate but negative on FNB).  The patient presents to the [facility] for an evaluation and management of problem(s) noted below:  Problem List 1. 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 Multiple gastric polyps   3. Abnormal findings on esophagogastroduodenoscopy (EGD)   4. Pancreatic lesion   5. Intestinal metaplasia of gastric mucosa   6. Abnormal finding on radiology exam   7. Elevated Chromogranin A     History of Present Illness Please see initial consultation note and progress notes by Dr. Carlean Purl and myself for full details of HPI.  Interval History The patient returns for scheduled follow-up.  Since my last clinic visit I had performed an EGD/EUS with EMR of multiple gastric polyps returning showing evidence of gastric adenomas as well as gastric neuroendocrine tumors.  A repeat EGD/EUS was performed in January 2021 due to finding on dotatate scan of a potential pancreatic lesion with biopsy of a 4 mm lesion showing no evidence of malignant cells or carcinoid cells.  Scar sites showed evidence of chronic gastritis and intestinal metaplasia but no recurrent carcinoid/neuroendocrine tissue.  Patient has followed up with Dr. Benay Spice from oncology as well as Dr. Carlean Purl.  She returns for follow-up at this time.  She has no other complaints.  She is still dealing with the day-to-day effects and mood issues that stem from losing her husband last year.   Otherwise no significant GI symptoms other than very minimal abdominal discomfort at times that is not postprandial.  GI Review of Systems Positive as above Negative for odynophagia, dysphagia, nausea, vomiting, change in bowel habits, melena, hematochezia  Review of Systems General: Denies fevers/chills/unintentional weight loss Cardiovascular: Denies chest pain Pulmonary: Denies shortness of breath Gastroenterological: See HPI Genitourinary: Denies darkened urine Hematological: Positive for easy bruising/bleeding due to anticoagulation Dermatological: Denies jaundice Psychological: Mood is stable   Medications Current Outpatient Medications  Medication Sig Dispense Refill  . acetaminophen (TYLENOL) 500 MG tablet Take 500 mg by mouth every 6 (six) hours as needed (for pain.).     Marland Kitchen Alum Hydroxide-Mag Trisilicate (GAVISCON) 93-26.7 MG CHEW Chew 1-2 tablets by mouth 2 (two) times daily as needed (indigestion).    . Cholecalciferol (VITAMIN D3) 50 MCG (2000 UT) TABS Take 2,000 Units by mouth daily.    Marland Kitchen dicyclomine (BENTYL) 20 MG tablet Take 1 tablet (20 mg total) by mouth every 6 (six) hours as needed for spasms (abdominal cramps, pain). 60 tablet 0  . diltiazem (CARDIZEM CD) 360 MG 24 hr capsule Take 1 capsule by mouth at bedtime 90 capsule 0  . EUTHYROX 100 MCG tablet Take 1 tablet by mouth once daily 90 tablet 0  . ferrous sulfate 325 (65 FE) MG tablet Take 325 mg by mouth daily.    Marland Kitchen lovastatin (MEVACOR) 10 MG tablet Take 1 tablet (10 mg total) by mouth once a week. 13 tablet 3  . metoprolol succinate (TOPROL-XL) 25 MG 24 hr tablet Take 1 tablet by mouth once daily 90 tablet 0  . omeprazole (PRILOSEC) 40 MG capsule Take 40 mg by  mouth daily.    Marland Kitchen telmisartan (MICARDIS) 40 MG tablet Take 1 tablet (40 mg total) by mouth daily. 90 tablet 3  . triamterene-hydrochlorothiazide (MAXZIDE-25) 37.5-25 MG tablet Take 1 tablet by mouth once daily 90 tablet 0  . vitamin B-12 (CYANOCOBALAMIN)  1000 MCG tablet Take 1,000 mcg by mouth daily.    Marland Kitchen warfarin (COUMADIN) 5 MG tablet TAKE AS DIRECTED BY  MOUTH  BY  ANTICOAGULATION  CLINIC 105 tablet 0   No current facility-administered medications for this visit.    Allergies Allergies  Allergen Reactions  . Guaifenesin & Derivatives Nausea And Vomiting    Hallucinations  . Iohexol Anaphylaxis, Hives and Shortness Of Breath    Incident 1990, premedicated prior to obtaining IV contrast since.  . Amoxicillin-Pot Clavulanate Swelling and Other (See Comments)    Facial swelling Did it involve swelling of the face/tongue/throat, SOB, or low BP? Yes Did it involve sudden or severe rash/hives, skin peeling, or any reaction on the inside of your mouth or nose? No Did you need to seek medical attention at a hospital or doctor's office? No When did it last happen?~7-8 years ago If all above answers are "NO", may proceed with cephalosporin use.   . Other     Contrast dye    Histories Past Medical History:  Diagnosis Date  . Allergy   . Anxiety   . Arthritis   . Asthma    many years ago  . Blood transfusion without reported diagnosis   . Cataract   . Clotting disorder ([place])   . Depression    Husband died 06-22-2019  . Diabetes ([place])    not on medication  . Dysrhythmia   . Family history of bone cancer   . Family history of breast cancer   . Family history of lung cancer   . Family history of ovarian cancer   . Family history of testicular cancer   . Family history of uterine cancer   . GERD (gastroesophageal reflux disease)   . Hypertension   . Hypothyroidism   . Iron deficiency anemia 03/30/2019  . Irregular heart beat    tachycardia  . Lupus anticoagulant with hypercoagulable state ([place])   . Melanoma ([place])    2005- face   . Pneumonia 2019  . Primary hypercoagulable state ([place])   . 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 Pulmonary embolism ([place])   . Thyroid disease     hypothyroidism   Past Surgical History:  Procedure Laterality Date  . ABDOMINAL HYSTERECTOMY     noncancerous  . BACK SURGERY  1983  . CHOLECYSTECTOMY    . COLONOSCOPY    . ENDOSCOPIC MUCOSAL RESECTION N/A 07/12/2019   Procedure: ENDOSCOPIC MUCOSAL RESECTION;  Surgeon: Rush Landmark Telford Nab., MD;  Location: [place];  Service: Gastroenterology;  Laterality: N/A;  . ESOPHAGOGASTRODUODENOSCOPY (EGD) WITH PROPOFOL N/A 07/12/2019   Procedure: ESOPHAGOGASTRODUODENOSCOPY (EGD) WITH PROPOFOL;  Surgeon: Rush Landmark Telford Nab., MD;  Location: [place];  Service: Gastroenterology;  Laterality: N/A;  . ESOPHAGOGASTRODUODENOSCOPY (EGD) WITH PROPOFOL N/A 10/09/2019   Procedure: ESOPHAGOGASTRODUODENOSCOPY (EGD) WITH PROPOFOL;  Surgeon: Rush Landmark Telford Nab., MD;  Location: [place];  Service: Gastroenterology;  Laterality: N/A;  . EUS N/A 07/12/2019   Procedure: UPPER ENDOSCOPIC ULTRASOUND (EUS) RADIAL;  Surgeon: Rush Landmark Telford Nab., MD;  Location: [place];  Service: Gastroenterology;  Laterality: N/A;  . EUS N/A 10/09/2019   Procedure: UPPER ENDOSCOPIC ULTRASOUND (EUS) RADIAL;  Surgeon: Rush Landmark Telford Nab., MD;  Location[facility]  END[facility];  Service: Gastroenterology;  Laterality: N/A;  . fatty tumor breast Right    removed  . FOREIGN BODY REMOVAL  10/09/2019   Procedure: FOREIGN BODY REMOVAL;  Surgeon: Meridee Score Netty Starring., MD;  Location: [facility] ENDOSCOPY;  Service: Gastroenterology;;  . HEMOSTASIS CLIP PLACEMENT  07/12/2019   Procedure: HEMOSTASIS CLIP PLACEMENT;  Surgeon: Lemar Lofty., MD;  Location: [facility] ENDOSCOPY;  Service: Gastroenterology;;  . HEMOSTASIS CLIP PLACEMENT  10/09/2019   Procedure: HEMOSTASIS CLIP PLACEMENT;  Surgeon: Lemar Lofty., MD;  Location: [facility] ENDOSCOPY;  Service: Gastroenterology;;  . HEMOSTASIS CONTROL  07/12/2019   Procedure: HEMOSTASIS CONTROL;  Surgeon: Lemar Lofty., MD;  Location: [facility] ENDOSCOPY;  Service:  Gastroenterology;;  . [facility]    . KNEE ARTHROSCOPY Left   . Lymp nodes Right 08/2004  . MELANOMA EXCISION Right 2005   face   . OOPHORECTOMY Bilateral   . POLYPECTOMY  07/12/2019   Procedure: POLYPECTOMY;  Surgeon: Meridee Score Netty Starring., MD;  Location: [facility] ENDOSCOPY;  Service: Gastroenterology;;  . POLYPECTOMY  10/09/2019   Procedure: POLYPECTOMY;  Surgeon: Lemar Lofty., MD;  Location: [facility] ENDOSCOPY;  Service: Gastroenterology;;  . removal lymphnodes in right neck    . SUBMUCOSAL LIFTING INJECTION  07/12/2019   Procedure: SUBMUCOSAL LIFTING INJECTION;  Surgeon: Meridee Score Netty Starring., MD;  Location: [facility] ENDOSCOPY;  Service: Gastroenterology;;  . Sunnie [place] LIFTING INJECTION  10/09/2019   Procedure: SUBMUCOSAL LIFTING INJECTION;  Surgeon: Lemar Lofty., MD;  Location: [facility] ENDOSCOPY;  Service: Gastroenterology;;  . UPPER GASTROINTESTINAL ENDOSCOPY     Social History   Socioeconomic History  . Marital status: Widowed    Spouse name: Not on file  . Number of children: Not on file  . Years of education: Not on file  . Highest education level: Not on file  Occupational History  . Not on file  Tobacco Use  . Smoking status: Never Smoker  . Smokeless tobacco: Never Used  Vaping Use  . Vaping Use: Never used  Substance and Sexual Activity  . Alcohol use: No    Alcohol/week: 0.0 standard drinks    Comment: no   . Drug use: No  . Sexual activity: Yes  Other Topics Concern  . Not on file  Social History Narrative   Married 1971. 1 child Shaquanta Harkless (by [employer]) lives in [place] with 2 grandchildren girls 8,6 in 2015.       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    Social Determinants of Health   Financial Resource Strain:   . Difficulty of Paying Living Expenses:   Food Insecurity:   . Worried About Programme researcher, broadcasting/film/video in the Last Year:   . Barista in the Last Year:    Transportation Needs:   . Freight forwarder (Medical):   Marland Kitchen Lack of Transportation (Non-Medical):   Physical Activity:   . Days of Exercise per Week:   . Minutes of Exercise per Session:   Stress:   . Feeling of Stress :   Social Connections:   . Frequency of Communication with Friends and Family:   . Frequency of Social Gatherings with Friends and Family:   . Attends Religious Services:   . Active Member of Clubs or Organizations:   . Attends Banker Meetings:   Marland Kitchen Marital Status:   Intimate Partner Violence:   . Fear of Current or Ex-Partner:   .  Emotionally Abused:   Marland Kitchen Physically Abused:   . Sexually Abused:    Family History  Problem Relation Age of Onset  . Coronary artery disease Mother        Mom 42, Dad 57-smoker  . Diabetes Mother   . Ovarian cancer Mother 47       not officially diagnosed  . Hypertension Father   . Diabetes Father   . Mitral valve prolapse Sister   . Diabetes Sister   . Breast cancer Maternal Aunt 84  . Breast cancer Half-Sister 63  . Breast cancer Half-Sister 27  . Uterine cancer Niece 55  . Breast cancer Niece        diagnosed mid-60s  . Lung cancer Nephew   . Bone cancer Nephew 64  . Cancer Niece 23       unknown type  . Alzheimer's disease Maternal Aunt   . Breast cancer Other        maternal great-aunt, diagnosed in her 41s  . Breast cancer Other        4th degree maternal relative, diagnosed in her 58s  . Colon cancer Neg Hx   . Esophageal cancer Neg Hx   . Pancreatic cancer Neg Hx   . Rectal cancer Neg Hx   . Stomach cancer Neg Hx   . Inflammatory bowel disease Neg Hx   . Liver disease Neg Hx    I have reviewed her medical, social, and family history in detail and updated the electronic medical record as necessary.    PHYSICAL EXAMINATION  BP 104/70 (BP Location: Left Arm, Patient Position: Sitting, Cuff Size: Normal)   Pulse 76   Ht 5\' 4"  (1.626 m)   Wt 196 lb (88.9 kg)   LMP  (LMP Unknown)   SpO2  96%   BMI 33.64 kg/m  Wt Readings from Last 3 Encounters:  04/18/20 196 lb (88.9 kg)  02/14/20 191 lb (86.6 kg)  11/14/19 192 lb 12.8 oz (87.5 kg)  GEN: Sad but in no acute distress, appears stated age, doesn't appear chronically ill PSYCH: Cooperative, without pressured speech EYE: Conjunctivae pink, sclerae anicteric ENT: MMM CV: Non-tachycardic RESP: No audible wheezing GI: NABS, soft, NT/ND, without rebound or guarding MSK/EXT: No significant lower extremity edema SKIN: No jaundice NEURO:  Alert & Oriented x 3, no focal deficits   REVIEW OF DATA  I reviewed the following data at the time of this encounter:  GI Procedures and Studies  January 2021 EUS EGD Impression: - No gross lesions in esophagus. Z-line irregular, 40 cm from the incisors. - Scar in the gastric body (greater curvature). Removed clips and polypoid tissue. Clips (MR conditional) were placed. - Scar in the gastric body (greater curvature). Removed clips and polypoid tissue. Clips (MR conditional) were placed. - Scar in the gastric body (greater curvature). Removed clips and polypoid tissue. Clips (MR conditional) were placed. - Erosive gastropathy with no bleeding and no stigmata of recent bleeding. - No gross lesions in the duodenal bulb, in the first portion of the duodenum and in the second portion of the duodenum. EUS Impression: - A lesion was identified in the pancreatic body. Tissue was obtained from this exam, and results are pending. However, the endosonographic appearance is suggestive of a neuroendocrine tumor (DOTATATE suggested this). Fine needle biopsy performed, though difficult in region to get steady sampling and due to the overall size of the reported lesion. - There was no sign of significant pathology in the common bile duct  and in the common hepatic duct. - No malignant-appearing lymph nodes were visualized in the celiac region (level 20), peripancreatic region and porta hepatis  region. Pathology FINAL MICROSCOPIC DIAGNOSIS:  A. STOMACH, LESSER CURVATURE SCAR, BIOPSY:  - Mildly active chronic gastritis with focal erosion.  - No intestinal metaplasia, dysplasia or carcinoma.  B. STOMACH, GREATER CURVE SCAR SITE, BIOPSY:  - Mildly active chronic gastritis with focal intestinal metaplasia.  - No dysplasia or carcinoma.  C. STOMACH, PROXIMAL GREATER CURVE SCAR, BIOPSY:  - Mildly active chronic gastritis with focal erosion and intestinal  metaplasia.  - No dysplasia or carcinoma.   August 2020 EGD - Multiple gastric polyps. Biopsied. Largest looks adenomatous and at distal cardia proximal body - broad base 12+ mm. Very friable and oozy so I think this and the other smaller polyps are likely source of iron-deficiency anemia. - The examination was otherwise normal. Diagnosis 1. Surgical [P], proximal gastric polyp (large) - WELL DIFFERENTIATED NEUROENDOCRINE NEOPLASM - SEE COMMENT 2. Surgical [P], distal gastric polyps (multiple) - WELL DIFFERENTIATED NEUROENDOCRINE NEOPLASM - GASTRIC ADENOMA - SEE COMMENT Microscopic Comment 1. and 2. By immunohistochemistry, the neoplastic cells are positive for CD56, chromogranin, and synaptophysin. The proliferative rate by ki-67 (performed on blocks 1&2) is low (<2%). Dr. Vic Ripper reviewed the case and agrees with the above diagnosis. A Warthin-Starry stain is performed to determine the possibility of the presence of Helicobacter pylori. The Warthin-Starry stain is NEGATIVE for organisms morphologically consistent with Helicobacter pylori.  Specimen Submitted: A. PANCREAS, BODY, LESION: FINAL MICROSCOPIC DIAGNOSIS:  - No malignant cells identified   Laboratory Studies  Reviewed those in epic  Imaging Studies  November 2020 dotatate IMPRESSION: 1. Focal radiotracer activity in the mid pancreas corresponding to a small 2-3 mm lesion in the mid pancreas and is consistent well differentiated neuroendocrine tumor  (primary or metastatic lesion) 2. Diffuse activity within the stomach without focality to suggest residual active neuroendocrine tumor.   ASSESSMENT  Ms. Morissette is a 64 y.o. female with a pmh significant for GERD, asthma, arthritis, MDD/anxiety, lupus anticoagulant with hypercoagulable state (on anticoagulation), prior VTE/PE, hypothyroidism, gastric adenomas, gastric neuroendocrine tumors, pancreatic lesion (positive on NET dotatate but negative on FNB).  The patient is seen today for evaluation and management of:  1. 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 Multiple gastric polyps   3. Abnormal findings on esophagogastroduodenoscopy (EGD)   4. Pancreatic lesion   5. Intestinal metaplasia of gastric mucosa   6. Abnormal finding on radiology exam   7. Elevated Chromogranin A    The patient is hemodynamically and clinically stable.  She is due for surveillance of her multiple gastric adenomas and gastric neuroendocrine tumor resections.  She also had a pancreatic lesion that was approximately 2 to 3 mm on dotatate spot FNB Was Nondiagnostic/Did Not Show Evidence of Malignant Cells.  We Will Plan to Repeat EGD with Potential EMR and EUS of the Pancreatic Region and See If Repeat Biopsy May Be Necessary.  Subcentimeter Pancreatic Neuroendocrine Tumors Rarely Require Intervention.  Repeat Chromogranin a Will Be Checked with Her Being off of PPI for At Least 1 Full Week.  The risks of an EUS including intestinal perforation, bleeding, infection, aspiration, and medication effects were discussed as was the possibility it may not give a definitive diagnosis if a biopsy is performed.  When a biopsy of the pancreas is done as part of the EUS, there is an additional risk of pancreatitis at the  rate of about 1-2%.  It was explained that procedure related pancreatitis is typically mild, although it can be severe and even life threatening, which is why we do not perform random  pancreatic biopsies and only biopsy a lesion/area we feel is concerning enough to warrant the risk.  The risks and benefits of endoscopic evaluation were discussed with the patient; these include but are not limited to the risk of perforation, infection, bleeding, missed lesions, lack of diagnosis, severe illness requiring hospitalization, as well as anesthesia and sedation related illnesses.  The patient is agreeable to proceed.  All patient questions were answered, to the best of my ability, and the patient agrees to the aforementioned plan of action with follow-up as indicated.  I think her follow-up with oncology can be postponed until after our procedure.  I will reach out to oncology about this.   PLAN  Preprocedure labs as outlined below Chromogranin A to be drawn after being off PPI for 1 full week Continue PPI therapy currently EGD/EUS with EMR/FNB in the coming weeks Gastric mapping for intestinal metaplasia to be performed at time of endoscopy  Repeat dotatate scan to be considered in future depending on findings   Orders Placed This Encounter  Procedures  . Procedural/ Surgical Case Request: UPPER ESOPHAGEAL ENDOSCOPIC ULTRASOUND (EUS)  . CBC  . Chromogranin A  . Ambulatory referral to Gastroenterology    New Prescriptions   No medications on file   Modified Medications   No medications on file    Planned Follow Up No follow-ups on file.   Total Time in Face-to-Face and in Coordination of Care for patient including independent/personal interpretation/review of prior testing, medical history, examination, medication adjustment, communicating results with the patient directly, and documentation with the EHR is 30 minutes.   Justice Britain, MD [facility] Office # [phone]

## 2020-04-24 ENCOUNTER — Telehealth: Payer: Self-pay | Admitting: Oncology

## 2020-04-24 ENCOUNTER — Telehealth: Payer: Self-pay | Admitting: *Deleted

## 2020-04-24 NOTE — Telephone Encounter (Addendum)
EUS not until 06/19/20. Per Dr. Benay Spice: Move August lab/OV to 1st 2 weeks in October. Scheduling message sent and VM left for patient to call office. Called on 04/25/20 and left message w/new appointment information.

## 2020-04-24 NOTE — Telephone Encounter (Signed)
Scheduled appt per 8/4 sch msg - unable to reach pt .left message with apt date and time

## 2020-04-29 ENCOUNTER — Other Ambulatory Visit: Payer: Self-pay | Admitting: Family Medicine

## 2020-05-06 ENCOUNTER — Other Ambulatory Visit (INDEPENDENT_AMBULATORY_CARE_PROVIDER_SITE_OTHER): Payer: Medicare Other

## 2020-05-06 DIAGNOSIS — K317 Polyp of stomach and duodenum: Secondary | ICD-10-CM | POA: Diagnosis not present

## 2020-05-06 DIAGNOSIS — C7A8 Other malignant neuroendocrine tumors: Secondary | ICD-10-CM

## 2020-05-06 LAB — CBC
HCT: 34.4 % — ABNORMAL LOW (ref 36.0–46.0)
Hemoglobin: 11.5 g/dL — ABNORMAL LOW (ref 12.0–15.0)
MCHC: 33.4 g/dL (ref 30.0–36.0)
MCV: 83.3 fl (ref 78.0–100.0)
Platelets: 215 10*3/uL (ref 150.0–400.0)
RBC: 4.13 Mil/uL (ref 3.87–5.11)
RDW: 15.1 % (ref 11.5–15.5)
WBC: 6 10*3/uL (ref 4.0–10.5)

## 2020-05-09 ENCOUNTER — Other Ambulatory Visit: Payer: Self-pay

## 2020-05-09 DIAGNOSIS — K869 Disease of pancreas, unspecified: Secondary | ICD-10-CM

## 2020-05-09 LAB — CHROMOGRANIN A: Chromogranin A (ng/mL): 905.8 ng/mL — ABNORMAL HIGH (ref 0.0–101.8)

## 2020-05-13 ENCOUNTER — Other Ambulatory Visit: Payer: Medicare Other

## 2020-05-13 ENCOUNTER — Telehealth: Payer: Self-pay | Admitting: General Practice

## 2020-05-13 NOTE — Telephone Encounter (Signed)
Instructions for coumadin and Lovenox pre and post procedure on 9/29.  9/24 - Last dose of coumadin until after procedure 9/25 - Nothing (No coumadin and no Lovenox) 9/26 - Lovenox X 1 in the AM 9/27 - Lovenox X 1 in the AM 9/28 - Lovenox X 1 in the AM (Take by 7 am) 9/29 - Procedure (Do not take Lovenox today) 9/29 - Lovenox and 1 1/2 tablets of coumadin 9/30 - Lovenox and 1 1/2 tablets of coumadin 10/1 - Lovenox and 1 1/2 tablets of coumadin 10/2 - Lovenox and 1 tablet of coumadin. 10/3 - Stop Lovenox and continue coumadin 1 tablet daily except 1/2 tablet on Mondays.  10/6 - Check INR

## 2020-05-14 ENCOUNTER — Other Ambulatory Visit: Payer: Medicare Other

## 2020-05-14 ENCOUNTER — Ambulatory Visit: Payer: Medicare Other | Admitting: Oncology

## 2020-05-15 ENCOUNTER — Ambulatory Visit: Payer: Medicare Other

## 2020-05-15 ENCOUNTER — Ambulatory Visit (INDEPENDENT_AMBULATORY_CARE_PROVIDER_SITE_OTHER): Payer: Medicare Other | Admitting: General Practice

## 2020-05-15 ENCOUNTER — Other Ambulatory Visit: Payer: Self-pay

## 2020-05-15 DIAGNOSIS — Z7901 Long term (current) use of anticoagulants: Secondary | ICD-10-CM

## 2020-05-15 LAB — POCT INR: INR: 2.5 (ref 2.0–3.0)

## 2020-05-15 MED ORDER — ENOXAPARIN SODIUM 150 MG/ML ~~LOC~~ SOLN
150.0000 mg | SUBCUTANEOUS | 0 refills | Status: DC
Start: 2020-05-15 — End: 2020-08-19

## 2020-05-15 NOTE — Progress Notes (Signed)
I have reviewed and agree with note, evaluation, plan.   Shields Pautz, MD  

## 2020-05-15 NOTE — Patient Instructions (Addendum)
Pre visit review using our clinic review tool, if applicable. No additional management support is needed unless otherwise documented below in the visit note.  Continue to take 1 tablet daily except 1/2 tablet on Mondays.  Re-check in 6 weeks.  Please follow pt instructions for Lovenox bridge.  Instructions for coumadin and Lovenox pre and post procedure on 9/29.  9/24 - Last dose of coumadin until after procedure 9/25 - Nothing (No coumadin and no Lovenox) 9/26 - Lovenox X 1 in the AM 9/27 - Lovenox X 1 in the AM 9/28 - Lovenox X 1 in the AM (Take by 7 am) 9/29 - Procedure (Do not take Lovenox today) 9/29 - Lovenox and 1 1/2 tablets of coumadin 9/30 - Lovenox and 1 1/2 tablets of coumadin 10/1 - Lovenox and 1 1/2 tablets of coumadin 10/2 - Lovenox and 1 tablet of coumadin. 10/3 - Stop Lovenox and continue coumadin 1 tablet daily except 1/2 tablet on Mondays.  10/6 - Check INR

## 2020-05-28 ENCOUNTER — Other Ambulatory Visit (INDEPENDENT_AMBULATORY_CARE_PROVIDER_SITE_OTHER): Payer: Medicare Other

## 2020-05-28 DIAGNOSIS — K869 Disease of pancreas, unspecified: Secondary | ICD-10-CM | POA: Diagnosis not present

## 2020-05-28 LAB — IBC + FERRITIN
Ferritin: 9.5 ng/mL — ABNORMAL LOW (ref 10.0–291.0)
Iron: 60 ug/dL (ref 42–145)
Saturation Ratios: 11.3 % — ABNORMAL LOW (ref 20.0–50.0)
Transferrin: 378 mg/dL — ABNORMAL HIGH (ref 212.0–360.0)

## 2020-06-08 ENCOUNTER — Other Ambulatory Visit: Payer: Self-pay | Admitting: Family Medicine

## 2020-06-11 ENCOUNTER — Telehealth: Payer: Self-pay

## 2020-06-11 NOTE — Telephone Encounter (Signed)
Pt states she has a procedure on the 09/29 of this month and states she has to go on Lovenox and she states the way Jenny Reichmann has it for the 09/24 she takes the last dose of coumadin and on 09/25 nothing, 09/26  1 Lovenox in am, 09/27 1 Lovenox am, 09/28 Lovenox am take before 7am and 09/29 do not take Lovenox then she has to take Lovenox and 1/2 coumadin. She needs clarification on how to take coumadin/lovenox on 06/19/20. She states Jenny Reichmann is out of office until 09/27 so she would like some guidance.

## 2020-06-11 NOTE — Telephone Encounter (Signed)
Pt has questions about her coumadin in regards to her upcoming surgery and would like to be called. She states to call after 4:30

## 2020-06-11 NOTE — Telephone Encounter (Signed)
From cindy's note. On 9/29 do not take lovenox or coumadin  9/24 - Last dose of coumadin until after procedure 9/25 - Nothing (No coumadin and no Lovenox) 9/26 - Lovenox X 1 in the AM 9/27 - Lovenox X 1 in the AM 9/28 - Lovenox X 1 in the AM (Take by 7 am) 9/29 - Procedure (Do not take Lovenox today) 9/29 - Lovenox and 1 1/2 tablets of coumadin 9/30 - Lovenox and 1 1/2 tablets of coumadin 10/1 - Lovenox and 1 1/2 tablets of coumadin 10/2 - Lovenox and 1 tablet of coumadin. 10/3 - Stop Lovenox and continue coumadin 1 tablet daily except 1/2 tablet on Mondays.

## 2020-06-12 NOTE — Telephone Encounter (Signed)
Note, will call pt after 4:30 per pt request.

## 2020-06-12 NOTE — Telephone Encounter (Signed)
Follow bolded direction. Ignore lovenox and 1.5 tablets of coumadin listed under it- on 9/30 resume schedule

## 2020-06-12 NOTE — Telephone Encounter (Signed)
9/29 - Procedure (Do not take Lovenox today) 9/29 - Lovenox and 1 1/2 tablets of coumadin  This is where pt is getting confused. It says procedure do not take lovenox today then under that it has lovenox and 1 1/2 tab of coumadin. Is she supposed to take anything 09/29 or no?

## 2020-06-15 ENCOUNTER — Other Ambulatory Visit (HOSPITAL_COMMUNITY)
Admission: RE | Admit: 2020-06-15 | Discharge: 2020-06-15 | Disposition: A | Discharge status: Home / Self Care | Payer: Medicare Other | Source: Ambulatory Visit | Attending: Gastroenterology | Admitting: Gastroenterology

## 2020-06-15 DIAGNOSIS — Z01812 Encounter for preprocedural laboratory examination: Secondary | ICD-10-CM | POA: Insufficient documentation

## 2020-06-15 DIAGNOSIS — Z20822 Contact with and (suspected) exposure to covid-19: Secondary | ICD-10-CM | POA: Insufficient documentation

## 2020-06-15 LAB — SARS CORONAVIRUS 2 (TAT 6-24 HRS): SARS Coronavirus 2: NEGATIVE

## 2020-06-17 NOTE — Telephone Encounter (Signed)
Noted  

## 2020-06-17 NOTE — Telephone Encounter (Signed)
Milas Kocher,  I followed up with patient today so she understands everything.  Thanks for your help!  Jenny Reichmann

## 2020-06-19 ENCOUNTER — Ambulatory Visit (HOSPITAL_COMMUNITY): Payer: Medicare Other | Admitting: Registered Nurse

## 2020-06-19 ENCOUNTER — Other Ambulatory Visit: Payer: Self-pay

## 2020-06-19 ENCOUNTER — Ambulatory Visit (HOSPITAL_COMMUNITY)
Admission: RE | Admit: 2020-06-19 | Discharge: 2020-06-19 | Disposition: A | Discharge status: Home / Self Care | Payer: Medicare Other | Attending: Gastroenterology | Admitting: Gastroenterology

## 2020-06-19 ENCOUNTER — Encounter (HOSPITAL_COMMUNITY)
Admission: RE | Disposition: A | Discharge status: Home / Self Care | Payer: Self-pay | Source: Home / Self Care | Attending: Gastroenterology

## 2020-06-19 ENCOUNTER — Encounter (HOSPITAL_COMMUNITY): Payer: Self-pay | Admitting: Gastroenterology

## 2020-06-19 DIAGNOSIS — Z8049 Family history of malignant neoplasm of other genital organs: Secondary | ICD-10-CM | POA: Insufficient documentation

## 2020-06-19 DIAGNOSIS — K319 Disease of stomach and duodenum, unspecified: Secondary | ICD-10-CM | POA: Diagnosis not present

## 2020-06-19 DIAGNOSIS — Z85020 Personal history of malignant carcinoid tumor of stomach: Secondary | ICD-10-CM | POA: Diagnosis not present

## 2020-06-19 DIAGNOSIS — Z9049 Acquired absence of other specified parts of digestive tract: Secondary | ICD-10-CM | POA: Insufficient documentation

## 2020-06-19 DIAGNOSIS — Z801 Family history of malignant neoplasm of trachea, bronchus and lung: Secondary | ICD-10-CM | POA: Diagnosis not present

## 2020-06-19 DIAGNOSIS — Z8349 Family history of other endocrine, nutritional and metabolic diseases: Secondary | ICD-10-CM | POA: Insufficient documentation

## 2020-06-19 DIAGNOSIS — Z86711 Personal history of pulmonary embolism: Secondary | ICD-10-CM | POA: Insufficient documentation

## 2020-06-19 DIAGNOSIS — M1990 Unspecified osteoarthritis, unspecified site: Secondary | ICD-10-CM | POA: Insufficient documentation

## 2020-06-19 DIAGNOSIS — Z880 Allergy status to penicillin: Secondary | ICD-10-CM | POA: Insufficient documentation

## 2020-06-19 DIAGNOSIS — C7A8 Other malignant neuroendocrine tumors: Secondary | ICD-10-CM

## 2020-06-19 DIAGNOSIS — Z8043 Family history of malignant neoplasm of testis: Secondary | ICD-10-CM | POA: Insufficient documentation

## 2020-06-19 DIAGNOSIS — F329 Major depressive disorder, single episode, unspecified: Secondary | ICD-10-CM | POA: Diagnosis not present

## 2020-06-19 DIAGNOSIS — Z6832 Body mass index (BMI) 32.0-32.9, adult: Secondary | ICD-10-CM | POA: Insufficient documentation

## 2020-06-19 DIAGNOSIS — Z85820 Personal history of malignant melanoma of skin: Secondary | ICD-10-CM | POA: Insufficient documentation

## 2020-06-19 DIAGNOSIS — I10 Essential (primary) hypertension: Secondary | ICD-10-CM | POA: Diagnosis not present

## 2020-06-19 DIAGNOSIS — Z803 Family history of malignant neoplasm of breast: Secondary | ICD-10-CM | POA: Insufficient documentation

## 2020-06-19 DIAGNOSIS — Z86010 Personal history of colonic polyps: Secondary | ICD-10-CM | POA: Diagnosis not present

## 2020-06-19 DIAGNOSIS — N289 Disorder of kidney and ureter, unspecified: Secondary | ICD-10-CM | POA: Insufficient documentation

## 2020-06-19 DIAGNOSIS — Z808 Family history of malignant neoplasm of other organs or systems: Secondary | ICD-10-CM | POA: Insufficient documentation

## 2020-06-19 DIAGNOSIS — F419 Anxiety disorder, unspecified: Secondary | ICD-10-CM | POA: Insufficient documentation

## 2020-06-19 DIAGNOSIS — D509 Iron deficiency anemia, unspecified: Secondary | ICD-10-CM | POA: Diagnosis not present

## 2020-06-19 DIAGNOSIS — E039 Hypothyroidism, unspecified: Secondary | ICD-10-CM | POA: Insufficient documentation

## 2020-06-19 DIAGNOSIS — Z888 Allergy status to other drugs, medicaments and biological substances status: Secondary | ICD-10-CM | POA: Insufficient documentation

## 2020-06-19 DIAGNOSIS — K3189 Other diseases of stomach and duodenum: Secondary | ICD-10-CM | POA: Diagnosis not present

## 2020-06-19 DIAGNOSIS — M329 Systemic lupus erythematosus, unspecified: Secondary | ICD-10-CM | POA: Diagnosis not present

## 2020-06-19 DIAGNOSIS — E669 Obesity, unspecified: Secondary | ICD-10-CM | POA: Diagnosis not present

## 2020-06-19 DIAGNOSIS — Z8249 Family history of ischemic heart disease and other diseases of the circulatory system: Secondary | ICD-10-CM | POA: Insufficient documentation

## 2020-06-19 DIAGNOSIS — K219 Gastro-esophageal reflux disease without esophagitis: Secondary | ICD-10-CM | POA: Insufficient documentation

## 2020-06-19 DIAGNOSIS — K8689 Other specified diseases of pancreas: Secondary | ICD-10-CM | POA: Diagnosis not present

## 2020-06-19 DIAGNOSIS — E1136 Type 2 diabetes mellitus with diabetic cataract: Secondary | ICD-10-CM | POA: Diagnosis not present

## 2020-06-19 DIAGNOSIS — Z820 Family history of epilepsy and other diseases of the nervous system: Secondary | ICD-10-CM | POA: Insufficient documentation

## 2020-06-19 DIAGNOSIS — K317 Polyp of stomach and duodenum: Secondary | ICD-10-CM

## 2020-06-19 DIAGNOSIS — D689 Coagulation defect, unspecified: Secondary | ICD-10-CM | POA: Insufficient documentation

## 2020-06-19 DIAGNOSIS — Z8041 Family history of malignant neoplasm of ovary: Secondary | ICD-10-CM | POA: Diagnosis not present

## 2020-06-19 DIAGNOSIS — T182XXA Foreign body in stomach, initial encounter: Secondary | ICD-10-CM

## 2020-06-19 DIAGNOSIS — K449 Diaphragmatic hernia without obstruction or gangrene: Secondary | ICD-10-CM | POA: Insufficient documentation

## 2020-06-19 DIAGNOSIS — K2970 Gastritis, unspecified, without bleeding: Secondary | ICD-10-CM | POA: Diagnosis not present

## 2020-06-19 DIAGNOSIS — Z833 Family history of diabetes mellitus: Secondary | ICD-10-CM | POA: Insufficient documentation

## 2020-06-19 DIAGNOSIS — Z90710 Acquired absence of both cervix and uterus: Secondary | ICD-10-CM | POA: Insufficient documentation

## 2020-06-19 DIAGNOSIS — Z8719 Personal history of other diseases of the digestive system: Secondary | ICD-10-CM | POA: Diagnosis not present

## 2020-06-19 DIAGNOSIS — Z91041 Radiographic dye allergy status: Secondary | ICD-10-CM | POA: Insufficient documentation

## 2020-06-19 HISTORY — PX: FOREIGN BODY REMOVAL: SHX962

## 2020-06-19 HISTORY — PX: POLYPECTOMY: SHX5525

## 2020-06-19 HISTORY — PX: HEMOSTASIS CLIP PLACEMENT: SHX6857

## 2020-06-19 HISTORY — PX: UPPER ESOPHAGEAL ENDOSCOPIC ULTRASOUND (EUS): SHX6562

## 2020-06-19 HISTORY — PX: ESOPHAGOGASTRODUODENOSCOPY: SHX5428

## 2020-06-19 HISTORY — PX: FINE NEEDLE ASPIRATION: SHX5430

## 2020-06-19 HISTORY — PX: BIOPSY: SHX5522

## 2020-06-19 HISTORY — PX: ENDOSCOPIC MUCOSAL RESECTION: SHX6839

## 2020-06-19 LAB — GLUCOSE, CAPILLARY: Glucose-Capillary: 113 mg/dL — ABNORMAL HIGH (ref 70–99)

## 2020-06-19 SURGERY — UPPER ESOPHAGEAL ENDOSCOPIC ULTRASOUND (EUS)
Anesthesia: Monitor Anesthesia Care

## 2020-06-19 MED ORDER — PROPOFOL 1000 MG/100ML IV EMUL
INTRAVENOUS | Status: AC
  Filled 2020-06-19: qty 100

## 2020-06-19 MED ORDER — PROPOFOL 500 MG/50ML IV EMUL
INTRAVENOUS | Status: DC | PRN
  Administered 2020-06-19: 150 ug/kg/min via INTRAVENOUS

## 2020-06-19 MED ORDER — FERROUS SULFATE 325 (65 FE) MG PO TABS
325.0000 mg | ORAL_TABLET | Freq: Every day | ORAL | 3 refills | Status: DC
Start: 2020-06-19 — End: 2022-05-28

## 2020-06-19 MED ORDER — LIDOCAINE 2% (20 MG/ML) 5 ML SYRINGE
INTRAMUSCULAR | Status: DC | PRN
  Administered 2020-06-19: 60 mg via INTRAVENOUS

## 2020-06-19 MED ORDER — SODIUM CHLORIDE 0.9 % IV SOLN: INTRAVENOUS | Status: DC

## 2020-06-19 MED ORDER — ONDANSETRON HCL 4 MG/2ML IJ SOLN
INTRAMUSCULAR | Status: DC | PRN
  Administered 2020-06-19: 4 mg via INTRAVENOUS

## 2020-06-19 MED ORDER — PROPOFOL 10 MG/ML IV BOLUS
INTRAVENOUS | Status: DC | PRN
  Administered 2020-06-19: 30 mg via INTRAVENOUS
  Administered 2020-06-19: 10 mg via INTRAVENOUS

## 2020-06-19 MED ORDER — LACTATED RINGERS IV SOLN
INTRAVENOUS | Status: DC
  Administered 2020-06-19: 1000 mL via INTRAVENOUS

## 2020-06-19 NOTE — Anesthesia Preprocedure Evaluation (Addendum)
Anesthesia Evaluation  Patient identified by MRN, date of birth, ID band Patient awake    Reviewed: Allergy & Precautions, NPO status , Patient's Chart, lab work & pertinent test results, reviewed documented beta blocker date and time   History of Anesthesia Complications Negative for: history of anesthetic complications  Airway Mallampati: II  TM Distance: >3 FB Neck ROM: Full    Dental  (+) Dental Advisory Given   Pulmonary PE 06/15/2020 SARS coronavirus NEG   breath sounds clear to auscultation       Cardiovascular hypertension, Pt. on medications and Pt. on home beta blockers (-) angina Rhythm:Regular Rate:Normal     Neuro/Psych Anxiety Depression negative neurological ROS     GI/Hepatic Neg liver ROS, GERD  Controlled,Neuroendocrine tumor of stomach   Endo/Other  diabetesHypothyroidism lupus  Renal/GU Renal InsufficiencyRenal disease     Musculoskeletal  (+) Arthritis ,   Abdominal (+) + obese,   Peds  Hematology coumadin   Anesthesia Other Findings   Reproductive/Obstetrics                            Anesthesia Physical Anesthesia Plan  ASA: II  Anesthesia Plan: MAC   Post-op Pain Management:    Induction:   PONV Risk Score and Plan: 2 and Treatment may vary due to age or medical condition and Ondansetron  Airway Management Planned: Natural Airway and Nasal Cannula  Additional Equipment: None  Intra-op Plan:   Post-operative Plan:   Informed Consent: I have reviewed the patients History and Physical, chart, labs and discussed the procedure including the risks, benefits and alternatives for the proposed anesthesia with the patient or authorized representative who has indicated his/her understanding and acceptance.     Dental advisory given  Plan Discussed with: CRNA and Surgeon  Anesthesia Plan Comments:        Anesthesia Quick Evaluation

## 2020-06-19 NOTE — Op Note (Signed)
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 Sherrill Medicines:                Monitored Anesthesia Care Complications:            No immediate complications. Estimated Blood Loss:     Estimated blood loss was minimal. Procedure:                Pre-Anesthesia Assessment:                           - Prior to the procedure, a History and Physical                            was performed, and patient medications and                            allergies were reviewed. The patient's tolerance of                            previous anesthesia was also reviewed. The risks                            and benefits of the procedure and the sedation                            options and risks were discussed with the patient.                            All questions were answered, and informed consent                            was obtained. Prior Anticoagulants: The patient                            last took  Coumadin (warfarin) 5 days and Lovenox                            (enoxaparin) 1 day prior to the procedure. ASA                            Grade Assessment: III - A patient with severe                            systemic disease. After reviewing the risks and                            benefits, the patient was deemed in satisfactory                             condition to undergo the procedure.                           After obtaining informed consent, the endoscope was                            passed under direct vision. Throughout the                            procedure, the patient's blood pressure, pulse, and                            oxygen saturations were monitored continuously. The                            [id number]-H190 ([id number]) Olympus gastroscope was                            introduced through the mouth, and advanced to the                            second part of duodenum. The [id number] ([id number])                            Olympus Linear EUS was introduced through the                            mouth, and advanced to the duodenum for ultrasound                            examination from the stomach and duodenum. The                            upper EUS was accomplished without difficulty. The                            patient tolerated the procedure. Scope In: Scope Out: Findings:      ENDOSCOPIC FINDING: :      No gross lesions  were noted in the entire esophagus.      The Z-line was regular and was found 36 cm from the incisors.      A 2 cm hiatal hernia was present.      Multiple small post mucosectomy scars were found in the gastric body.       The scar tissue was healthy in appearance. No evidence of residual       polypoid tissue or recurrence noted in these regions.      Two endoclips were found on the proximal greater curvature of the       stomach with nodularity underneath, suggestive clip artifact but in       setting of prior history of NET/Adenomas, preparations were made for       resection. Strip mucosal resection using a snare was performed.       Resection and retrieval were complete. To prevent bleeding       post-intervention, one hemostatic clip was successfully placed (MR       conditional). There was no bleeding at the end of the procedure.      Two 7 mm semi-sessile polyps with no bleeding and no  stigmata of recent       bleeding were found, one in the middle gastric body and one in the       proximal gastric antrum. Preparations were made for mucosal resection.       NBI and White-light imaging was done to demarcate the borders of the       lesion. Orise gel was injected to raise the lesion. Snare mucosal       resection was performed. Resection and retrieval were complete. To       prevent bleeding after mucosal resection, two hemostatic clips were       successfully placed (MR conditional). There was no bleeding during, or       at the end, of the procedure. Preparations were made for the second       mucosal resection. NBI and White-light imaging was done to demarcate the       borders of the lesion. Orise gel was injected to raise the lesion. Snare       mucosal resection was performed. Resection and retrieval were complete.       To prevent bleeding after mucosal resection, two hemostatic clips were       successfully placed (MR conditional). There was no bleeding during, or       at the end, of the procedure.      Segmental moderate inflammation characterized by erosions, erythema and       granularity were found in the entire examined stomach. With history of       intestinal metaplasia, gastric mapping was performed. Biopsies were       taken with a cold forceps for histology from the antrum. Biopsies were       taken with a cold forceps for histology from the greater curve. Biopsies       were taken with a cold forceps for histology from the lesser curve.       Biopsies were taken with a cold forceps for histology from the incisura.       Biopsies were taken with a cold forceps for histology from the       fundus/cardia.      No gross lesions were noted in the duodenal bulb, in the first portion  of the duodenum and in the second portion of the duodenum.      ENDOSONOGRAPHIC FINDING: :      An irregular lesion was identified in the pancreatic body (suggestive of        the lesion on the DOTATATE scan previously completed). The lesion was       hypoechoic with a central calcification. This is adjacent to the super       mesenteric artery and vein but not involving it. The lesion measured 3.4       mm by 4.6 mm in maximal cross-sectional diameter. The outer margins were       smooth. The remainder of the pancreas was examined. The endosonographic       appearance of parenchyma and the upstream pancreatic duct indicated a       normal appearing duct, no ductal or parenchymal calcifications and no       parenchymal atrophy. The pancreatic duct in the head measured 1.1 mm,       the pancreatic duct in the neck measured 0.7 mm, the pancreatic duct in       the body measured 0.9 mm -> 0.4 mm, the pancreatic duct in the tail       measured 0.4 mm.      There was dilation in the common bile duct (3.5 mm -> 6.2 mm) and in the       common hepatic duct (9.5 mm).      Endosonographic imaging of the ampulla showed no intramural       (subepithelial) lesion.      No malignant-appearing lymph nodes were visualized in the celiac region       (level 20), perigastric region and peripancreatic region.      Endosonographic imaging in the visualized portion of the liver showed no       mass.      The celiac region was visualized. Impression:               EGD Impression:                           - No gross lesions in esophagus. Z-line regular, 36                            cm from the incisors.                           - 2 cm hiatal hernia.                           - Scars noted in the gastric body.                           - Endoclips were found in the stomach. Removed. 1                            clip placed in area to decrease bleeding risk.                           - Two gastric polyps. Resected and retrieved via  mucosal resection. Clips (MR conditional) were                            placed to close defects and decrease risk of                             bleeding.                           - Gastritis with history of intestinal metaplasia -                            Gastric mapping biopsies performed.                           - No gross lesions in the duodenal bulb, in the                            first portion of the duodenum and in the second                            portion of the duodenum.                           EUS Impression:                           - A lesion was identified in the pancreatic body.                            Cytology results are pending. However, the                            endosonographic appearance is suggestive of a                            possible neuroendocrine tumor (as evidenced by                            prior DOTATATE that had some uptake in this region.                           - There was dilation in the common bile duct and in                            the common hepatic duct.                           - No malignant-appearing lymph nodes were                            visualized in the celiac region (level 20),                            perigastric region and  peripancreatic region. Moderate Sedation:      Not Applicable - Patient had care per Anesthesia. Recommendation:           - The patient will be observed post-procedure,                            until all discharge criteria are met.                           - Discharge patient to home.                           - Patient has a contact number available for                            emergencies. The signs and symptoms of potential                            delayed complications were discussed with the                            patient. Return to normal activities tomorrow.                            Written discharge instructions were provided to the                            patient.                           - Lovenox restart on 9/30. Coumadin restart on 9/30                            as per  Coumadin clinic notation.                           - No NSAIDs for 2-3 weeks.                           - PPI 40 mg twice daily x 4-weeks and then back                            down to 40 mg daily thereafter.                           - Observe patient's clinical course.                           - Await cytology results and await path results.                           - If there remains concern or need for repeat                            attempt at sampling the pancreatic lesion then  would ask for one of my other colleagues to                            consider attempt at sampling in future.                           - Decrease Iron to once daily (she is having more                            significant symptoms from intake of twice daily).                           - Iron indices in 4-6 weeks and if she is                            decreasing then IV Iron infusions likely needed.                           - Repeat the upper endoscopic ultrasound/EGD for                            surveillance based on pathology results - query                            going out to 6-12 months but can decide in future.                           - No evidence of today's examination for reasoning                            for IDA, so will need primary GI to consider                            further workup as indicated.                           - The findings and recommendations were discussed                            with the patient.                           - The findings and recommendations were discussed                            with the patient's family. Procedure Code(s):        --- Professional ---                           [phone], Esophagogastroduodenoscopy, flexible,                            transoral; with endoscopic mucosal resection  24097, Esophagogastroduodenoscopy, flexible,                            transoral; with  endoscopic ultrasound examination                            limited to the esophagus, stomach or duodenum, and                            adjacent structures Diagnosis Code(s):        --- Professional ---                           K44.9, Diaphragmatic hernia without obstruction or                            gangrene                           K31.89, Other diseases of stomach and duodenum                           T18.2XXA, Foreign body in stomach, initial encounter                           K31.7, Polyp of stomach and duodenum                           K29.70, Gastritis, unspecified, without bleeding                           K86.89, Other specified diseases of pancreas                           I89.9, Noninfective disorder of lymphatic vessels                            and lymph nodes, unspecified                           K83.8, Other specified diseases of biliary tract CPT copyright 2019 [employer]. All rights reserved. The codes documented in this report are preliminary and upon coder review may  be revised to meet current compliance requirements. Justice Britain, MD 06/19/2020 9:43:29 AM Number of Addenda: 0

## 2020-06-19 NOTE — Discharge Instructions (Signed)
Upper Endoscopy, Adult, Care After  This sheet gives you information about how to care for yourself after your procedure. Your health care provider may also give you more specific instructions. If you have problems or questions, contact your health care provider.  What can I expect after the procedure?  After the procedure, it is common to have:  · A sore throat.  · Mild stomach pain or discomfort.  · Bloating.  · Nausea.  Follow these instructions at home:    · Follow instructions from your health care provider about what to eat or drink after your procedure.  · Return to your normal activities as told by your health care provider. Ask your health care provider what activities are safe for you.  · Take over-the-counter and prescription medicines only as told by your health care provider.  · Do not drive for 24 hours if you were given a sedative during your procedure.  · Keep all follow-up visits as told by your health care provider. This is important.  Contact a health care provider if you have:  · A sore throat that lasts longer than one day.  · Trouble swallowing.  Get help right away if:  · You vomit blood or your vomit looks like coffee grounds.  · You have:  ? A fever.  ? Bloody, black, or tarry stools.  ? A severe sore throat or you cannot swallow.  ? Difficulty breathing.  ? Severe pain in your chest or abdomen.  Summary  · After the procedure, it is common to have a sore throat, mild stomach discomfort, bloating, and nausea.  · Do not drive for 24 hours if you were given a sedative during the procedure.  · Follow instructions from your health care provider about what to eat or drink after your procedure.  · Return to your normal activities as told by your health care provider.  This information is not intended to replace advice given to you by your health care provider. Make sure you discuss any questions you have with your health care provider.  Document Revised: 03/01/2018 Document Reviewed:  02/07/2018  [employer]vier Patient Education © 2020 [employer]

## 2020-06-19 NOTE — H&P (Signed)
GASTROENTEROLOGY PROCEDURE H&P NOTE   Primary Care Physician: Marin Olp, MD  HPI: Erin Kirk is a 64 y.o. female who presents for EGD/EUS with possible EMR with history of Gastric NETs and Gastric Adenomas and also a small PET-avid DOTATATE scan suggestive of a 3 mm pancreatic lesion (s/p prior attempt at EUS with negative biopsies).  Past Medical History:  Diagnosis Date  . Allergy   . Anxiety   . Arthritis   . Asthma    many years ago  . Blood transfusion without reported diagnosis   . Cataract   . Clotting disorder ([place])   . Depression    Husband died 2019-06-19  . Diabetes ([place])    not on medication  . Dysrhythmia   . Family history of bone cancer   . Family history of breast cancer   . Family history of lung cancer   . Family history of ovarian cancer   . Family history of testicular cancer   . Family history of uterine cancer   . GERD (gastroesophageal reflux disease)   . Hypertension   . Hypothyroidism   . Iron deficiency anemia 03/30/2019  . Irregular heart beat    tachycardia  . Lupus anticoagulant with hypercoagulable state ([place])   . Melanoma ([place])    2005- face   . Pneumonia 2019  . Primary hypercoagulable state ([place])   . 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 Pulmonary embolism ([place])   . Thyroid disease    hypothyroidism   Past Surgical History:  Procedure Laterality Date  . ABDOMINAL HYSTERECTOMY     noncancerous  . BACK SURGERY  1983  . CHOLECYSTECTOMY    . COLONOSCOPY    . ENDOSCOPIC MUCOSAL RESECTION N/A 07/12/2019   Procedure: ENDOSCOPIC MUCOSAL RESECTION;  Surgeon: Rush Landmark Telford Nab., MD;  Location: [facility];  Service: Gastroenterology;  Laterality: N/A;  . ESOPHAGOGASTRODUODENOSCOPY (EGD) WITH PROPOFOL N/A 07/12/2019   Procedure: ESOPHAGOGASTRODUODENOSCOPY (EGD) WITH PROPOFOL;  Surgeon: Rush Landmark Telford Nab., MD;  Location: [facility];  Service: Gastroenterology;   Laterality: N/A;  . ESOPHAGOGASTRODUODENOSCOPY (EGD) WITH PROPOFOL N/A 10/09/2019   Procedure: ESOPHAGOGASTRODUODENOSCOPY (EGD) WITH PROPOFOL;  Surgeon: Rush Landmark Telford Nab., MD;  Location: [facility];  Service: Gastroenterology;  Laterality: N/A;  . EUS N/A 07/12/2019   Procedure: UPPER ENDOSCOPIC ULTRASOUND (EUS) RADIAL;  Surgeon: Rush Landmark Telford Nab., MD;  Location: [facility];  Service: Gastroenterology;  Laterality: N/A;  . EUS N/A 10/09/2019   Procedure: UPPER ENDOSCOPIC ULTRASOUND (EUS) RADIAL;  Surgeon: Rush Landmark Telford Nab., MD;  Location: [facility];  Service: Gastroenterology;  Laterality: N/A;  . fatty tumor breast Right    removed  . FOREIGN BODY REMOVAL  10/09/2019   Procedure: FOREIGN BODY REMOVAL;  Surgeon: Rush Landmark Telford Nab., MD;  Location: [place];  Service: Gastroenterology;;  . HEMOSTASIS CLIP PLACEMENT  07/12/2019   Procedure: HEMOSTASIS CLIP PLACEMENT;  Surgeon: Irving Copas., MD;  Location: [facility];  Service: Gastroenterology;;  . HEMOSTASIS CLIP PLACEMENT  10/09/2019   Procedure: HEMOSTASIS CLIP PLACEMENT;  Surgeon: Irving Copas., MD;  Location: [facility];  Service: Gastroenterology;;  . HEMOSTASIS CONTROL  07/12/2019   Procedure: HEMOSTASIS CONTROL;  Surgeon: Irving Copas., MD;  Location: [facility];  Service: Gastroenterology;;  . [name]    . KNEE ARTHROSCOPY Left   . Lymp nodes Right 08/2004  . MELANOMA EXCISION Right 2005   face   . OOPHORECTOMY Bilateral   . POLYPECTOMY  07/12/2019   Procedure: POLYPECTOMY;  Surgeon: Irving Copas., MD;  Location: [facility];  Service: Gastroenterology;;  . POLYPECTOMY  10/09/2019   Procedure: POLYPECTOMY;  Surgeon: Irving Copas., MD;  Location: [facility];  Service: Gastroenterology;;  . removal lymphnodes in right neck    . SUBMUCOSAL LIFTING INJECTION  07/12/2019   Procedure: SUBMUCOSAL LIFTING INJECTION;  Surgeon: Rush Landmark Telford Nab., MD;  Location: [place] [facility];  Service: Gastroenterology;;  . Lia Foyer LIFTING INJECTION  10/09/2019   Procedure: SUBMUCOSAL LIFTING INJECTION;  Surgeon: Irving Copas., MD;  Location: [facility];  Service: Gastroenterology;;  . UPPER GASTROINTESTINAL ENDOSCOPY     Current Facility-Administered Medications  Medication Dose Route Frequency Provider Last Rate Last Admin  . 0.9 %  sodium chloride infusion   Intravenous Continuous Mansouraty, Telford Nab., MD      . lactated ringers infusion   Intravenous Continuous Mansouraty, Telford Nab., MD 125 mL/hr at 06/19/20 0709 1,000 mL at 06/19/20 0709   Allergies  Allergen Reactions  . Guaifenesin & Derivatives Nausea And Vomiting    Hallucinations  . Iohexol Anaphylaxis, Hives and Shortness Of Breath    Incident 1990, premedicated prior to obtaining IV contrast since.  . Amoxicillin-Pot Clavulanate Swelling and Other (See Comments)    Facial swelling Did it involve swelling of the face/tongue/throat, SOB, or low BP? Yes Did it involve sudden or severe rash/hives, skin peeling, or any reaction on the inside of your mouth or nose? No Did you need to seek medical attention at a hospital or doctor's office? No When did it last happen?~7-8 years ago If all above answers are "NO", may proceed with cephalosporin use.   . Other     Contrast dye   Family History  Problem Relation Age of Onset  . Coronary artery disease Mother        Mom 6, Dad 57-smoker  . Diabetes Mother   . Ovarian cancer Mother 41       not officially diagnosed  . Hypertension Father   . Diabetes Father   . Mitral valve prolapse Sister   . Diabetes Sister   . Breast cancer Maternal Aunt 84  . Breast cancer Half-Sister 63  . Breast cancer Half-Sister 87  . Uterine cancer Niece 33  . Breast cancer Niece        diagnosed mid-60s  . Lung cancer Nephew   . Bone cancer Nephew 64  . Cancer Niece 8       unknown type  . Alzheimer's disease Maternal Aunt    . Breast cancer Other        maternal great-aunt, diagnosed in her 29s  . Breast cancer Other        4th degree maternal relative, diagnosed in her 58s  . Colon cancer Neg Hx   . Esophageal cancer Neg Hx   . Pancreatic cancer Neg Hx   . Rectal cancer Neg Hx   . Stomach cancer Neg Hx   . Inflammatory bowel disease Neg Hx   . Liver disease Neg Hx    Social History   Socioeconomic History  . Marital status: Widowed    Spouse name: Not on file  . Number of children: Not on file  . Years of education: Not on file  . Highest education level: Not on file  Occupational History  . Not on file  Tobacco Use  . Smoking status: Never Smoker  . Smokeless tobacco: Never Used  Vaping Use  . Vaping Use: Never used  Substance and Sexual Activity  .  Alcohol use: No    Alcohol/week: 0.0 standard drinks    Comment: no   . Drug use: No  . Sexual activity: Yes  Other Topics Concern  . Not on file  Social History Narrative   Married 1971. 1 child Fey Coghill (by [employer]) lives in [place] with 2 grandchildren girls 8,6 in 2015.       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    Social Determinants of Health   Financial Resource Strain:   . Difficulty of Paying Living Expenses: Not on file  Food Insecurity:   . Worried About Charity fundraiser in the Last Year: Not on file  . Ran Out of Food in the Last Year: Not on file  Transportation Needs:   . Lack of Transportation (Medical): Not on file  . Lack of Transportation (Non-Medical): Not on file  Physical Activity:   . Days of Exercise per Week: Not on file  . Minutes of Exercise per Session: Not on file  Stress:   . Feeling of Stress : Not on file  Social Connections:   . Frequency of Communication with Friends and Family: Not on file  . Frequency of Social Gatherings with Friends and Family: Not on file  . Attends Religious Services: Not on file  .  Active Member of Clubs or Organizations: Not on file  . Attends Archivist Meetings: Not on file  . Marital Status: Not on file  Intimate Partner Violence:   . Fear of Current or Ex-Partner: Not on file  . Emotionally Abused: Not on file  . Physically Abused: Not on file  . Sexually Abused: Not on file    Physical Exam: Vital signs in last 24 hours: Temp:  [97.6 F (36.4 C)] 97.6 F (36.4 C) (09/29 0643) Pulse Rate:  [89] 89 (09/29 0659) Resp:  [17] 17 (09/29 0659) BP: (129)/(74) 129/74 (09/29 0659) SpO2:  [99 %] 99 % (09/29 0659) Weight:  [86.2 kg] 86.2 kg (09/29 0659)   GEN: NAD EYE: Sclerae anicteric ENT: MMM CV: Non-tachycardic GI: Soft, protuberant abdomen, TTP generalized throughout NEURO:  Alert & Oriented x 3  Lab Results: No results for input(s): WBC, HGB, HCT, PLT in the last 72 hours. BMET No results for input(s): NA, K, CL, CO2, GLUCOSE, BUN, CREATININE, CALCIUM in the last 72 hours. LFT No results for input(s): PROT, ALBUMIN, AST, ALT, ALKPHOS, BILITOT, BILIDIR, IBILI in the last 72 hours. PT/INR No results for input(s): LABPROT, INR in the last 72 hours.   Impression / Plan: This is a 64 y.o.female who presents for EGD/EUS with possible EMR with history of Gastric NETs and Gastric Adenomas and also a small PET-avid DOTATATE scan suggestive of a 3 mm pancreatic lesion (s/p prior attempt at EUS with negative biopsies).  The risks of an EUS including intestinal perforation, bleeding, infection, aspiration, and medication effects were discussed as was the possibility it may not give a definitive diagnosis if a biopsy is performed.  When a biopsy of the pancreas is done as part of the EUS, there is an additional risk of pancreatitis at the rate of about 1-2%.  It was explained that procedure related pancreatitis is typically mild, although it can be severe and even life threatening, which is why we do not perform random pancreatic biopsies and only biopsy a  lesion/area we feel is concerning enough to warrant the risk.  The risks and benefits of endoscopic evaluation were discussed with the patient; these include but are not limited to the risk of perforation, infection, bleeding, missed lesions, lack of diagnosis, severe illness requiring hospitalization, as well as anesthesia and sedation related illnesses.  The patient is agreeable to proceed.    Justice Britain, MD [facility] Advanced [facility]oscopy Office # [phone]

## 2020-06-19 NOTE — Anesthesia Postprocedure Evaluation (Signed)
Anesthesia Post Note  Patient: SERAI TUKES  Procedure(s) Performed: UPPER ESOPHAGEAL ENDOSCOPIC ULTRASOUND (EUS) (N/A ) POLYPECTOMY ENDOSCOPIC MUCOSAL RESECTION FINE NEEDLE ASPIRATION (FNA) LINEAR ESOPHAGOGASTRODUODENOSCOPY (EGD) (N/A ) FOREIGN BODY REMOVAL BIOPSY HEMOSTASIS CLIP PLACEMENT     Patient location during evaluation: Endoscopy Anesthesia Type: MAC Level of consciousness: awake and alert, patient cooperative and oriented Pain management: pain level controlled Vital Signs Assessment: post-procedure vital signs reviewed and stable Respiratory status: spontaneous breathing, nonlabored ventilation and respiratory function stable Cardiovascular status: stable and blood pressure returned to baseline Postop Assessment: no apparent nausea or vomiting Anesthetic complications: no   No complications documented.  Last Vitals:  Vitals:   06/19/20 0659 06/19/20 0911  BP: 129/74 134/70  Pulse: 89 66  Resp: 17 14  Temp:  36.6 C  SpO2: 99% 100%    Last Pain:  Vitals:   06/19/20 0911  TempSrc: Axillary  PainSc:                  Tashona Calk,E. Chakita Mcgraw

## 2020-06-19 NOTE — Transfer of Care (Signed)
Immediate Anesthesia Transfer of Care Note  Patient: Erin Kirk  Procedure(s) Performed: UPPER ESOPHAGEAL ENDOSCOPIC ULTRASOUND (EUS) (N/A ) POLYPECTOMY ENDOSCOPIC MUCOSAL RESECTION FINE NEEDLE ASPIRATION (FNA) LINEAR  Patient Location: PACU  Anesthesia Type:MAC  Level of Consciousness: sedated  Airway & Oxygen Therapy: Patient Spontanous Breathing and Patient connected to face mask oxygen  Post-op Assessment: Report given to RN and Post -op Vital signs reviewed and stable  Post vital signs: Reviewed and stable  Last Vitals:  Vitals Value Taken Time  BP    Temp    Pulse 65 06/19/20 0913  Resp 23 06/19/20 0913  SpO2 100 % 06/19/20 0913  Vitals shown include unvalidated device data.  Last Pain:  Vitals:   06/19/20 0659  TempSrc:   PainSc: 0-No pain         Complications: No complications documented.

## 2020-06-19 NOTE — Anesthesia Procedure Notes (Signed)
Date/Time: 06/19/2020 7:41 AM Performed by: Talbot Grumbling, CRNA Pre-anesthesia Checklist: Patient identified, Emergency Drugs available, Suction available and Patient being monitored Patient Re-evaluated:Patient Re-evaluated prior to induction Oxygen Delivery Method: Simple face mask

## 2020-06-20 ENCOUNTER — Encounter (HOSPITAL_COMMUNITY): Payer: Self-pay | Admitting: Gastroenterology

## 2020-06-20 LAB — CYTOLOGY - NON PAP

## 2020-06-26 ENCOUNTER — Ambulatory Visit (INDEPENDENT_AMBULATORY_CARE_PROVIDER_SITE_OTHER): Payer: Medicare Other | Admitting: General Practice

## 2020-06-26 ENCOUNTER — Other Ambulatory Visit: Payer: Self-pay

## 2020-06-26 DIAGNOSIS — D6859 Other primary thrombophilia: Secondary | ICD-10-CM

## 2020-06-26 DIAGNOSIS — Z7901 Long term (current) use of anticoagulants: Secondary | ICD-10-CM

## 2020-06-26 LAB — POCT INR: INR: 2.2 (ref 2.0–3.0)

## 2020-06-26 NOTE — Progress Notes (Signed)
I have reviewed and agree with note, evaluation, plan.   Quana Chamberlain, MD  

## 2020-06-26 NOTE — Patient Instructions (Signed)
Pre visit review using our clinic review tool, if applicable. No additional management support is needed unless otherwise documented below in the visit note.  Take 1 1/2 tablets today as a boost and then continue to take 1 tablet daily except 1/2 tablet on Mondays.  Re-check in 4 weeks.

## 2020-06-27 ENCOUNTER — Inpatient Hospital Stay: Payer: Medicare Other

## 2020-06-27 ENCOUNTER — Inpatient Hospital Stay: Payer: Medicare Other | Attending: Oncology | Admitting: Oncology

## 2020-06-27 ENCOUNTER — Telehealth: Payer: Self-pay | Admitting: Oncology

## 2020-06-27 ENCOUNTER — Other Ambulatory Visit: Payer: Self-pay

## 2020-06-27 VITALS — BP 131/76 | HR 70 | Temp 96.9°F | Resp 16 | Ht 64.0 in | Wt 194.5 lb

## 2020-06-27 DIAGNOSIS — Z7901 Long term (current) use of anticoagulants: Secondary | ICD-10-CM | POA: Insufficient documentation

## 2020-06-27 DIAGNOSIS — D6861 Antiphospholipid syndrome: Secondary | ICD-10-CM | POA: Diagnosis not present

## 2020-06-27 DIAGNOSIS — R109 Unspecified abdominal pain: Secondary | ICD-10-CM | POA: Diagnosis not present

## 2020-06-27 DIAGNOSIS — C7A092 Malignant carcinoid tumor of the stomach: Secondary | ICD-10-CM | POA: Insufficient documentation

## 2020-06-27 DIAGNOSIS — E039 Hypothyroidism, unspecified: Secondary | ICD-10-CM | POA: Insufficient documentation

## 2020-06-27 DIAGNOSIS — C7A8 Other malignant neuroendocrine tumors: Secondary | ICD-10-CM | POA: Diagnosis not present

## 2020-06-27 DIAGNOSIS — D500 Iron deficiency anemia secondary to blood loss (chronic): Secondary | ICD-10-CM | POA: Diagnosis not present

## 2020-06-27 DIAGNOSIS — Z803 Family history of malignant neoplasm of breast: Secondary | ICD-10-CM | POA: Diagnosis not present

## 2020-06-27 DIAGNOSIS — Z85820 Personal history of malignant melanoma of skin: Secondary | ICD-10-CM | POA: Diagnosis not present

## 2020-06-27 LAB — CBC WITH DIFFERENTIAL (CANCER CENTER ONLY)
Abs Immature Granulocytes: 0.02 10*3/uL (ref 0.00–0.07)
Basophils Absolute: 0.1 10*3/uL (ref 0.0–0.1)
Basophils Relative: 1 %
Eosinophils Absolute: 0.2 10*3/uL (ref 0.0–0.5)
Eosinophils Relative: 4 %
HCT: 38 % (ref 36.0–46.0)
Hemoglobin: 12.6 g/dL (ref 12.0–15.0)
Immature Granulocytes: 0 %
Lymphocytes Relative: 30 %
Lymphs Abs: 2 10*3/uL (ref 0.7–4.0)
MCH: 28.3 pg (ref 26.0–34.0)
MCHC: 33.2 g/dL (ref 30.0–36.0)
MCV: 85.2 fL (ref 80.0–100.0)
Monocytes Absolute: 0.5 10*3/uL (ref 0.1–1.0)
Monocytes Relative: 8 %
Neutro Abs: 3.8 10*3/uL (ref 1.7–7.7)
Neutrophils Relative %: 57 %
Platelet Count: 210 10*3/uL (ref 150–400)
RBC: 4.46 MIL/uL (ref 3.87–5.11)
RDW: 14.5 % (ref 11.5–15.5)
WBC Count: 6.7 10*3/uL (ref 4.0–10.5)
nRBC: 0 % (ref 0.0–0.2)

## 2020-06-27 NOTE — Progress Notes (Signed)
[facility] OFFICE PROGRESS NOTE   Diagnosis:   INTERVAL HISTORY:   [name] returns as scheduled.  She reports constipation and nausea when taking iron.  The iron was decreased to once daily by Dr. Rush Landmark.  She underwent an upper endoscopy and EUS last week.  She has intermittent diarrhea, but this is not a consistent symptom.  No flushing or fever.  Her appetite is poor.  Objective:  Vital signs in last 24 hours:  Blood pressure 131/76, pulse 70, temperature (!) 96.9 F (36.1 C), temperature source Tympanic, resp. rate 16, height 5\' 4"  (1.626 m), weight 194 lb 8 oz (88.2 kg), SpO2 100 %.    Lymphatics: No cervical, supraclavicular, axillary, or inguinal nodes Resp: Lungs clear bilaterally Cardio: Regular rate and rhythm GI: No hepatosplenomegaly, no mass, nontender Vascular: No leg edema     Lab Results:  Lab Results  Component Value Date   WBC 6.7 06/27/2020   HGB 12.6 06/27/2020   HCT 38.0 06/27/2020   MCV 85.2 06/27/2020   PLT 210 06/27/2020   NEUTROABS 3.8 06/27/2020    CMP  Lab Results  Component Value Date   NA 139 03/15/2020   K 3.6 03/15/2020   CL 101 03/15/2020   CO2 30 03/15/2020   GLUCOSE 91 03/15/2020   BUN 18 03/15/2020   CREATININE 1.15 03/15/2020   CALCIUM 9.0 03/15/2020   PROT 7.2 02/14/2020   ALBUMIN 4.8 02/14/2020   AST 18 02/14/2020   ALT 18 02/14/2020   ALKPHOS 69 02/14/2020   BILITOT 0.8 02/14/2020   GFRNONAA 46 (L) 07/12/2019   GFRAA 53 (L) 07/12/2019     Medications: I have reviewed the patient's current medications.   Assessment/Plan: 1. Multiple gastric carcinoid tumors  Upper endoscopy 05/05/2019 with resection of multiple gastric carcinoid tumors  EUS 07/12/2019-resection of multiple gastric carcinoid tumors, positive resection margin at the gastric cardia polypectomy site, tubular adenomas and hyperplastic polyps removed from the lesser curvature  Elevated chromogranin A level  CT abdomen/pelvis  05/18/2019-no evidence of a gastric mass or metastatic disease  Netspot 08/11/2019-focal activity corresponding to a 2-3 mm lesion in the mid pancreas, diffuse activity in the stomach  EGD 10/09/2019- 3 areas of scar/clip/polypoid tissue removed with negative pathology  EUS 10/09/2019-4 mm pancreas body lesion, FNA biopsy negative, no adenopathy  EUS 06/19/2020-no evidence of recurrent tumor in areas of gastric body scars, nodularity was noted underneath 2 endoclips in the greater curvature, this area was biopsied and polyps were removed from the middle gastric body and proximal gastric antrum, biopsies from erosions and erythema were taken at the greater curvature, lesser curvature, and antrum.  An irregular was then divided the pancreas body measuring 3.4 x 4.6 mm, the pancreas lesion was biopsied.  The pathology from the pancreas FNA was negative, the pathology from multiple biopsies including the gastric body scar, antrum polyp, greater curvature biopsy, lesser curvature biopsy, and gastric cardia biopsy revealed low-grade neuroendocrine tumor 2. History of iron deficiency anemia secondary bleeding from #1 and maintained on anticoagulation therapy 3. 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 Diabetes 5. Abdominal pain-potentially related to #1 6.   Hypothyroidism 7.  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  Family history of uterine and breast cancer 9.  Gastric and colonic adenomas 10.  Hypodense pancreas lesion 05/18/2019 -positive Netspot uptake 08/11/2019, negative FNA biopsy      Disposition: Erin Kirk appears well.  She underwent resection of gastric polyps and multiple  additional biopsies last week.  The pathology revealed low-grade neuroendocrine tumor involving a gastric polyp, multiple random biopsies, and biopsy at the site of endoclips.  She appears asymptomatic from the carcinoid tumor.  She does not have evidence of carcinoid syndrome.   She appears to have diffuse carcinoid tumor involving the stomach.  She will return for an office visit and chromogranin a level in approximately 4 months.  She will hold omeprazole for 4 days prior to the lab visit.  She will continue endoscopic follow-up with Dr. Rush Landmark.  We will present her case at the GI tumor conference.  Betsy Coder, MD  06/27/2020  12:19 PM

## 2020-06-27 NOTE — Telephone Encounter (Signed)
Scheduled per los. Gave avs and calendar  

## 2020-06-28 LAB — CHROMOGRANIN A: Chromogranin A (ng/mL): 833.1 ng/mL — ABNORMAL HIGH (ref 0.0–101.8)

## 2020-06-29 ENCOUNTER — Encounter: Payer: Self-pay | Admitting: Gastroenterology

## 2020-07-01 ENCOUNTER — Telehealth: Payer: Self-pay | Admitting: *Deleted

## 2020-07-01 ENCOUNTER — Other Ambulatory Visit: Payer: Self-pay | Admitting: Family Medicine

## 2020-07-01 NOTE — Telephone Encounter (Signed)
Notified of message below. Verbalized understanding 

## 2020-07-01 NOTE — Telephone Encounter (Signed)
-----   Message from Ladell Pier, MD sent at 06/28/2020  4:36 PM EDT ----- Please call patient, chromogranin A level is lower, follow-up as scheduled

## 2020-07-08 DIAGNOSIS — Z1231 Encounter for screening mammogram for malignant neoplasm of breast: Secondary | ICD-10-CM | POA: Diagnosis not present

## 2020-07-10 ENCOUNTER — Other Ambulatory Visit: Payer: Self-pay

## 2020-07-10 LAB — SURGICAL PATHOLOGY

## 2020-07-11 ENCOUNTER — Telehealth: Payer: Self-pay | Admitting: Gastroenterology

## 2020-07-11 NOTE — Telephone Encounter (Signed)
Patient called would like a call from Dr. Rush Landmark tomorrow morning she will be home by 9am also stated to please call her and not her son because he is on a business trip

## 2020-07-11 NOTE — Telephone Encounter (Signed)
Hey Dr Richardean Sale, Pt states she will be available for a call back between and 4pm.

## 2020-07-11 NOTE — Telephone Encounter (Signed)
The patient was discussed at [facility] on 07/10/2020. We reviewed the patient's clinical history as well as her pathology from her recent procedures. Based on reevaluation, what we are actually seeing in the gastric biopsies is not diffuse neuroendocrine tumor but rather a diffuse endocrine cell proliferation.  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 In the setting of her neuroendocrine tumors and her prior tubular adenomas this cyst fits the picture of still a type I gastric neuroendocrine type of situation. The biopsies of the stomach are not showing evidence of overt significant neuroendocrine tumors which is good. The consensus from radiology and pathology and oncology as well as GI and surgery was that we should monitor the patient periodically and remove polyps if they occur. However a total gastrectomy or partial gastrectomy would not be in the recommendation of everyone at this time. Thus surveillance is helpful. In regards to the small subcentimeter lesion in the pancreas that has been biopsied twice we certainly can continue to monitor that as well. We will likely deal with iron deficiency issues as a result of the autoimmune atrophic gastritis but certainly can give her iron and IV iron infusions if necessary. Dr. Benay Spice was in attendance and agreed with this plan of action. I will forward this to the patient's primary gastroenterologist Dr. Carlean Purl as well.  I attempted a call to the patient as well as the patient's son this morning and was not able to get a hold of them however I will try to call them later this afternoon to update them further.  The plan will be follow-up endoscopy with ultrasound every 6 to 12 months we can see how things are based on the patient's blood counts and overall decide if we need to do that as frequently or if we can just do it on a yearly basis.  I would discuss this further with Dr.  Carlean Purl.  Justice Britain, MD [facility] Advanced Endoscopy Office # [phone]

## 2020-07-12 NOTE — Telephone Encounter (Signed)
Dr. Rush Landmark, pt returned your call. She is requesting if you can call her on Monday around 8:00am. Thank you.

## 2020-07-12 NOTE — Telephone Encounter (Signed)
I have tried to call the patient on multiple times today but been on successful and I left a voicemail.  I will try 1 more time before the end of the week. If she does call please get a phone number and timing of when I can reach her best. Thanks. GM

## 2020-07-14 NOTE — Telephone Encounter (Signed)
I will try to call back to around that time though I will already be in procedure so it could be the afternoon.  Patty if you do not see a follow-up telephone message from me by 9 AM then please reach out to the patient and let her know I will call her at the end of the afternoon when I am completed with my procedures. Thanks. GM

## 2020-07-15 NOTE — Telephone Encounter (Signed)
Left message on machine to call back  

## 2020-07-15 NOTE — Telephone Encounter (Signed)
Recall entered for 6 month EUS

## 2020-07-15 NOTE — Telephone Encounter (Signed)
Finally able to get a hold of the patient this afternoon. We discussed the results of the [facility] meeting. She is relieved that gastrectomy is off the table for now. Patient will follow up with Dr. Benay Spice in February with plan for repeat chromogranin off PPI for 1 to 2 weeks. Consideration of repeat dotatate scan as per oncology and per chromogranin level is reasonable in 2022. Patient will have repeat blood counts checked periodically. Tentative repeat EGD/EUS in 6 months though may push it out to a year if issues are not occurring. Patient appreciative for the call back and discussion. Patty, please ensure our EGD/EUS recall is set for 6 months. Thanks to all. FYI Dr. Benay Spice and Dr. Carlean Purl and Dr. Yong Channel.

## 2020-07-17 ENCOUNTER — Other Ambulatory Visit: Payer: Self-pay | Admitting: Gastroenterology

## 2020-07-24 ENCOUNTER — Ambulatory Visit (INDEPENDENT_AMBULATORY_CARE_PROVIDER_SITE_OTHER): Payer: Medicare Other | Admitting: General Practice

## 2020-07-24 ENCOUNTER — Other Ambulatory Visit: Payer: Self-pay

## 2020-07-24 DIAGNOSIS — D6859 Other primary thrombophilia: Secondary | ICD-10-CM

## 2020-07-24 DIAGNOSIS — Z7901 Long term (current) use of anticoagulants: Secondary | ICD-10-CM

## 2020-07-24 LAB — POCT INR: INR: 3.5 — AB (ref 2.0–3.0)

## 2020-07-24 NOTE — Progress Notes (Signed)
I have reviewed and agree with note, evaluation, plan.   Korianna Washer, MD  

## 2020-07-24 NOTE — Patient Instructions (Signed)
Pre visit review using our clinic review tool, if applicable. No additional management support is needed unless otherwise documented below in the visit note. 

## 2020-08-16 ENCOUNTER — Other Ambulatory Visit: Payer: Self-pay | Admitting: Family Medicine

## 2020-08-19 ENCOUNTER — Other Ambulatory Visit: Payer: Self-pay

## 2020-08-19 ENCOUNTER — Encounter: Payer: Self-pay | Admitting: Family Medicine

## 2020-08-19 ENCOUNTER — Ambulatory Visit (INDEPENDENT_AMBULATORY_CARE_PROVIDER_SITE_OTHER): Payer: Medicare Other | Admitting: Family Medicine

## 2020-08-19 VITALS — BP 116/74 | HR 72 | Temp 98.2°F | Ht 64.0 in | Wt 197.0 lb

## 2020-08-19 DIAGNOSIS — I480 Paroxysmal atrial fibrillation: Secondary | ICD-10-CM | POA: Diagnosis not present

## 2020-08-19 DIAGNOSIS — D6859 Other primary thrombophilia: Secondary | ICD-10-CM

## 2020-08-19 DIAGNOSIS — E039 Hypothyroidism, unspecified: Secondary | ICD-10-CM

## 2020-08-19 DIAGNOSIS — E1169 Type 2 diabetes mellitus with other specified complication: Secondary | ICD-10-CM

## 2020-08-19 DIAGNOSIS — M545 Low back pain, unspecified: Secondary | ICD-10-CM | POA: Diagnosis not present

## 2020-08-19 DIAGNOSIS — I152 Hypertension secondary to endocrine disorders: Secondary | ICD-10-CM

## 2020-08-19 DIAGNOSIS — E785 Hyperlipidemia, unspecified: Secondary | ICD-10-CM

## 2020-08-19 DIAGNOSIS — E1159 Type 2 diabetes mellitus with other circulatory complications: Secondary | ICD-10-CM

## 2020-08-19 DIAGNOSIS — C7A8 Other malignant neuroendocrine tumors: Secondary | ICD-10-CM

## 2020-08-19 DIAGNOSIS — E538 Deficiency of other specified B group vitamins: Secondary | ICD-10-CM

## 2020-08-19 DIAGNOSIS — E669 Obesity, unspecified: Secondary | ICD-10-CM

## 2020-08-19 LAB — POC URINALSYSI DIPSTICK (AUTOMATED)
Bilirubin, UA: 1.015
Blood, UA: NEGATIVE
Clarity, UA: NEGATIVE
Color, UA: NEGATIVE
Glucose, UA: NEGATIVE
Ketones, UA: NEGATIVE
Leukocytes, UA: NEGATIVE
Nitrite, UA: NEGATIVE
Protein, UA: NEGATIVE
Spec Grav, UA: 1.015 (ref 1.010–1.025)
Urobilinogen, UA: 0.2 E.U./dL
pH, UA: 7 (ref 5.0–8.0)

## 2020-08-19 MED ORDER — TRIAMTERENE-HCTZ 37.5-25 MG PO TABS
1.0000 | ORAL_TABLET | Freq: Every day | ORAL | 3 refills | Status: DC
Start: 2020-08-19 — End: 2021-09-03

## 2020-08-19 NOTE — Patient Instructions (Addendum)
Health Maintenance Due  Topic Date Due  . OPHTHALMOLOGY EXAM - please have them send [place] a copy after you complete this 12/02/2016   Please stop by lab before you go If you have mychart- we will send your results within 3 business days of [place] receiving them.  If you do not have mychart- we will call you about results within 5 business days of [place] receiving them.  *please note we are currently using [facility] which has a longer processing time than [place] typically so labs may not come back as quickly as in the past *please also note that you will see labs on mychart as soon as they post. I will later go in and write notes on them- will say "notes from Dr. Yong Channel"  Recommended follow up: Return in about 6 months (around 02/16/2021) for follow up- or sooner if needed.

## 2020-08-19 NOTE — Progress Notes (Signed)
Phone [phone] In person visit   Subjective:   Erin Kirk is a 64 y.o. year old very pleasant female patient who presents for/with See problem oriented charting Chief Complaint  Patient presents with  . Hypertension  . Diabetes  . Hyperlipidemia   This visit occurred during the SARS-CoV-2 public health emergency.  Safety protocols were in place, including screening questions prior to the visit, additional usage of staff PPE, and extensive cleaning of exam room while observing appropriate contact time as indicated for disinfecting solutions.   Past Medical History-  Patient Active Problem List   Diagnosis Date Noted  . Neuroendocrine carcinoma of stomach ([place]) 07/12/2019    Priority: High  . Elevated Chromogranin 07/02/2019    Priority: High  . 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 Low vitamin B12 level 09/07/2016    Priority: High  . Disability examination 01/17/2015    Priority: High  . Diabetes mellitus type 2 in obese ([place]) 11/28/2010    Priority: High  . PAROXYSMAL ATRIAL FIBRILLATION 07/02/2010    Priority: High  . Primary hypercoagulable state ([place]) 11/30/2007    Priority: High  . Osteopenia 02/26/2020    Priority: Medium  . Vitamin D deficiency 07/20/2019    Priority: Medium  . Iron deficiency anemia 03/30/2019    Priority: Medium  . Hyperlipidemia associated with type 2 diabetes mellitus ([place]) 03/09/2019    Priority: Medium  . Cough variant asthma 10/14/2018    Priority: Medium  . Neuropathy due to medical condition ([place]) 11/09/2017    Priority: Medium  . Hx of adenomatous polyp of colon 04/10/2016    Priority: Medium  . CKD (chronic kidney disease), stage III ([place]) 09/05/2014    Priority: Medium  . Hypothyroidism 03/09/2007    Priority: Medium  . Hypertension associated with diabetes ([place]) 03/09/2007    Priority: Medium  . History of Melanoma 03/09/2007    Priority: Medium  . Abnormal  findings on esophagogastroduodenoscopy (EGD) 07/02/2019    Priority: Low  . 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    Priority: Low  . Multiple gastric polyps 07/02/2019    Priority: Low  . Long term (current) use of anticoagulants - warfarin 06/02/2017    Priority: Low  . Encounter for therapeutic drug monitoring 10/26/2013    Priority: Low  . Left facial pain 09/24/2012    Priority: Low  . Oral mucosal lesion 09/24/2012    Priority: Low  . TRANSIENT DISORDER INITIATING/MAINTAINING SLEEP 04/02/2010    Priority: Low  . Overweight(278.02) 11/29/2009    Priority: Low  . LOW BACK PAIN 06/15/2008    Priority: Low  . ALLERGIC RHINITIS 03/09/2007    Priority: Low  . GERD 03/09/2007    Priority: Low  . Pancreatic lesion 04/21/2020  . Abnormal finding on radiology exam 04/21/2020  . Intestinal metaplasia of gastric mucosa 04/21/2020  . Acquired thrombophilia ([place]) 02/14/2020  . Myalgia due to statin 02/14/2020  . Genetic testing 08/15/2019  . Family history of breast cancer   . Family history of ovarian cancer   . Family history of uterine cancer   . Family history of lung cancer   . Family history of bone cancer   . Family history of testicular cancer     Medications- reviewed and updated Current Outpatient Medications  Medication Sig Dispense Refill  . acetaminophen (TYLENOL) 500 MG tablet Take 500 mg by mouth every 6 (six) hours as needed (for pain.).     Marland Kitchen  Alum Hydroxide-Mag Trisilicate (GAVISCON) 27-03.5 MG CHEW Chew 1-2 tablets by mouth 2 (two) times daily as needed (indigestion).    . Cholecalciferol (VITAMIN D3) 50 MCG (2000 UT) TABS Take 2,000 Units by mouth daily.    Marland Kitchen dicyclomine (BENTYL) 20 MG tablet Take 1 tablet (20 mg total) by mouth every 6 (six) hours as needed for spasms (abdominal cramps, pain). 60 tablet 0  . diltiazem (CARDIZEM CD) 360 MG 24 hr capsule Take 1 capsule by mouth at bedtime 90 capsule 0  . ferrous sulfate 325 (65 FE) MG tablet Take 1 tablet (325 mg total)  by mouth daily with breakfast. Lunch and supper 30 tablet 3  . levothyroxine (SYNTHROID) 100 MCG tablet Take 1 tablet by mouth once daily (Patient taking differently: Take 100 mcg by mouth daily before breakfast. ) 90 tablet 0  . lovastatin (MEVACOR) 10 MG tablet Take 1 tablet (10 mg total) by mouth once a week. 13 tablet 3  . metoprolol succinate (TOPROL-XL) 25 MG 24 hr tablet Take 1 tablet by mouth once daily (Patient taking differently: Take 25 mg by mouth daily. ) 90 tablet 3  . omeprazole (PRILOSEC) 40 MG capsule TAKE 1 CAPSULE BY MOUTH TWICE DAILY BEFORE A MEAL FOR  4  WEEKS  AND  THEN  DECREASE  TO  ONCE  DAILY  FOR  8  WEEKS. (Patient taking differently: 40 mg daily. ) 60 capsule 0  . telmisartan (MICARDIS) 40 MG tablet Take 1 tablet (40 mg total) by mouth daily. (Patient taking differently: Take 40 mg by mouth at bedtime. ) 90 tablet 3  . triamterene-hydrochlorothiazide (MAXZIDE-25) 37.5-25 MG tablet Take 1 tablet by mouth daily. 90 tablet 3  . vitamin B-12 (CYANOCOBALAMIN) 1000 MCG tablet Take 1,000 mcg by mouth daily.    Marland Kitchen warfarin (COUMADIN) 5 MG tablet TAKE AS DIRECTED BY ANTICOAGULATION CLINIC 105 tablet 0   No current facility-administered medications for this visit.     Objective:  BP 116/74   Pulse 72   Temp 98.2 F (36.8 C) (Temporal)   Ht 5\' 4"  (1.626 m)   Wt 197 lb (89.4 kg)   LMP  (LMP Unknown)   SpO2 97%   BMI 33.81 kg/m  Gen: NAD, resting comfortably CV: RRR no murmurs rubs or gallops Lungs: CTAB no crackles, wheeze, rhonchi Abdomen: soft/nontender/nondistended/normal bowel sounds. No rebound or guarding.  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 Rush Landmark. Possible PET scan in February- sees Dr. Benay Spice in February.  -sparing bentyl for cramping  # left low back pain- slightly worse with urination. Pain for 2-3 weeks. Can  radiate around to abdomen slightly.  She has tried heating pad and cranberry juice and that has helped. No paint at moment- comes and goes.  - will check UA given association with being worse with urination - offered x-ray and with her improving wants to hold off for now but will let me know if worsening symptoms and we can reconsider.   # primary hypercoagulable state with history of multiple DVT and PE #acquired thrombophilia on coumadin S: patient remains on  Coumadin long term -requires lovenox bridge for procedures.  A/P: stable without obvious recurrence of dvt/PE- continue coumadin   # Atrial fibrillation S: Rate controlled with diltiazem 360mg  ER, metoprolol 25 mg XR Anticoagulated with  warfarin. Also requires lovenox bridge for procedures  A/P: Stable. Continue current medications.    # Iron deficiency anemia- iron levels have been low- currently taking once a day. Also on high dose PPI through GI. Also on stool softener Lab Results  Component Value Date   FERRITIN 9.5 (L) 05/28/2020   #hypertension S: medication:  diltiazem 360mg  XR, metoprolol 25 mg XR, telmisartan 40mg , triamterene hctz 37.5-25 mg Home readings #s: reasonable control at home usually 110/80s A/P: Stable. Continue current medications.   #hyperlipidemia- she prefers to stay on lowest dose possible with myalgia S: Medication: lovastatin 10 mg once a week  A/P: reasonable control and decent balance with myalgia history  # Diabetes S: Medication: diet controlled. Riding recumbent bike most days- up to 2 hours a day (hour in morning)  A/P: hopefully controlled- update a1c today  #hypothyroidism S: compliant On thyroid medication- levothyroxine 100 mcg A/P:hopefully [place]- update tsh   # B12 deficiency S: history of neuropathy with this. Metformin likely contributed. Doing well now on oral medication after time on injectoins.   A/P: controlled last check- defer to next visit   #Vitamin D deficiency S:  Medication: 2000 units daily  A/P:  Will check next year  Recommended follow up: Return in about 6 months (around 02/16/2021) for follow up- or sooner if needed. Future Appointments  Date Time Provider [place]  08/21/2020 10:15 AM [facility]  10/22/2020 10:00 AM [facility] None  10/29/2020 11:30 AM Benay Spice Izola Price, MD [facility] None   Social History   Social History Narrative   Married 1971. 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     Lab/Order associations:   ICD-10-CM   1. Diabetes mellitus type 2 in obese (HCC)  E11.69 HgB A1c   E66.9 CBC With Differential/Platelet    COMPLETE METABOLIC PANEL WITH GFR    Lipid Panel w/reflex Direct LDL  2. Acute left-sided low back pain without sciatica  M54.50 POCT Urinalysis Dipstick (Automated)  3. 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 Primary hypercoagulable state ([place])  D68.59   5. Paroxysmal atrial fibrillation (HCC)  I48.0   6. Hypertension associated with diabetes ([place])  E11.59    I15.2   7. Hyperlipidemia associated with type 2 diabetes mellitus (HCC)  E11.69    E78.5   8. Hypothyroidism, unspecified type  E03.9 TSH  9. Low vitamin B12 level  E53.8     Meds ordered this encounter  Medications  . triamterene-hydrochlorothiazide (MAXZIDE-25) 37.5-25 MG tablet    Sig: Take 1 tablet by mouth daily.    Dispense:  90 tablet    Refill:  3   Return precautions advised.  Garret Reddish, MD

## 2020-08-19 NOTE — Addendum Note (Signed)
Addended by: Doran Clay A on: 08/19/2020 05:07 PM   Modules accepted: Orders

## 2020-08-20 LAB — COMPLETE METABOLIC PANEL WITH GFR
AG Ratio: 1.8 (calc) (ref 1.0–2.5)
ALT: 17 U/L (ref 6–29)
AST: 15 U/L (ref 10–35)
Albumin: 4.4 g/dL (ref 3.6–5.1)
Alkaline phosphatase (APISO): 59 U/L (ref 37–153)
BUN/Creatinine Ratio: 13 (calc) (ref 6–22)
BUN: 15 mg/dL (ref 7–25)
CO2: 29 mmol/L (ref 20–32)
Calcium: 9.1 mg/dL (ref 8.6–10.4)
Chloride: 101 mmol/L (ref 98–110)
Creat: 1.16 mg/dL — ABNORMAL HIGH (ref 0.50–0.99)
GFR, Est African American: 57 mL/min/{1.73_m2} — ABNORMAL LOW (ref >=60)
GFR, Est Non African American: 49 mL/min/{1.73_m2} — ABNORMAL LOW (ref >=60)
Globulin: 2.5 g/dL (calc) (ref 1.9–3.7)
Glucose, Bld: 85 mg/dL (ref 65–99)
Potassium: 3.6 mmol/L (ref 3.5–5.3)
Sodium: 141 mmol/L (ref 135–146)
Total Bilirubin: 0.5 mg/dL (ref 0.2–1.2)
Total Protein: 6.9 g/dL (ref 6.1–8.1)

## 2020-08-20 LAB — CBC WITH DIFFERENTIAL/PLATELET
Absolute Monocytes: 462 cells/uL (ref 200–950)
Basophils Absolute: 98 cells/uL (ref 0–200)
Basophils Relative: 1.4 %
Eosinophils Absolute: 308 cells/uL (ref 15–500)
Eosinophils Relative: 4.4 %
HCT: 39 % (ref 35.0–45.0)
Hemoglobin: 13.2 g/dL (ref 11.7–15.5)
Lymphs Abs: 2086 cells/uL (ref 850–3900)
MCH: 29.1 pg (ref 27.0–33.0)
MCHC: 33.8 g/dL (ref 32.0–36.0)
MCV: 85.9 fL (ref 80.0–100.0)
MPV: 11.7 fL (ref 7.5–12.5)
Monocytes Relative: 6.6 %
Neutro Abs: 4046 cells/uL (ref 1500–7800)
Neutrophils Relative %: 57.8 %
Platelets: 210 10*3/uL (ref 140–400)
RBC: 4.54 10*6/uL (ref 3.80–5.10)
RDW: 14.5 % (ref 11.0–15.0)
Total Lymphocyte: 29.8 %
WBC: 7 10*3/uL (ref 3.8–10.8)

## 2020-08-20 LAB — HEMOGLOBIN A1C
Hgb A1c MFr Bld: 6 % of total Hgb — ABNORMAL HIGH (ref <5.7)
Mean Plasma Glucose: 126 (calc)
eAG (mmol/L): 7 (calc)

## 2020-08-20 LAB — LIPID PANEL W/REFLEX DIRECT LDL
Cholesterol: 159 mg/dL (ref <200)
HDL: 63 mg/dL (ref >=50)
LDL Cholesterol (Calc): 78 mg/dL (calc)
Non-HDL Cholesterol (Calc): 96 mg/dL (calc) (ref <130)
Total CHOL/HDL Ratio: 2.5 (calc) (ref <5.0)
Triglycerides: 98 mg/dL (ref <150)

## 2020-08-20 LAB — TSH: TSH: 1.7 mIU/L (ref 0.40–4.50)

## 2020-08-21 ENCOUNTER — Other Ambulatory Visit: Payer: Self-pay

## 2020-08-21 ENCOUNTER — Ambulatory Visit (INDEPENDENT_AMBULATORY_CARE_PROVIDER_SITE_OTHER): Payer: Medicare Other | Admitting: General Practice

## 2020-08-21 DIAGNOSIS — D6859 Other primary thrombophilia: Secondary | ICD-10-CM | POA: Diagnosis not present

## 2020-08-21 DIAGNOSIS — Z7901 Long term (current) use of anticoagulants: Secondary | ICD-10-CM

## 2020-08-21 LAB — POCT INR: INR: 2.6 (ref 2.0–3.0)

## 2020-08-21 NOTE — Progress Notes (Signed)
I have reviewed and agree with note, evaluation, plan.   Antaniya Venuti, MD  

## 2020-08-21 NOTE — Patient Instructions (Signed)
Pre visit review using our clinic review tool, if applicable. No additional management support is needed unless otherwise documented below in the visit note. 

## 2020-09-02 ENCOUNTER — Other Ambulatory Visit: Payer: Self-pay | Admitting: Family Medicine

## 2020-09-03 DIAGNOSIS — Z85820 Personal history of malignant melanoma of skin: Secondary | ICD-10-CM | POA: Diagnosis not present

## 2020-09-03 DIAGNOSIS — D485 Neoplasm of uncertain behavior of skin: Secondary | ICD-10-CM | POA: Diagnosis not present

## 2020-09-03 DIAGNOSIS — Z85828 Personal history of other malignant neoplasm of skin: Secondary | ICD-10-CM | POA: Diagnosis not present

## 2020-09-03 DIAGNOSIS — D225 Melanocytic nevi of trunk: Secondary | ICD-10-CM | POA: Diagnosis not present

## 2020-09-03 DIAGNOSIS — C44712 Basal cell carcinoma of skin of right lower limb, including hip: Secondary | ICD-10-CM | POA: Diagnosis not present

## 2020-09-03 DIAGNOSIS — D2222 Melanocytic nevi of left ear and external auricular canal: Secondary | ICD-10-CM | POA: Diagnosis not present

## 2020-09-03 DIAGNOSIS — D2262 Melanocytic nevi of left upper limb, including shoulder: Secondary | ICD-10-CM | POA: Diagnosis not present

## 2020-09-09 ENCOUNTER — Other Ambulatory Visit: Payer: Self-pay | Admitting: Gastroenterology

## 2020-09-29 ENCOUNTER — Other Ambulatory Visit: Payer: Self-pay | Admitting: Family Medicine

## 2020-10-02 ENCOUNTER — Ambulatory Visit (INDEPENDENT_AMBULATORY_CARE_PROVIDER_SITE_OTHER): Payer: Medicare Other | Admitting: General Practice

## 2020-10-02 ENCOUNTER — Other Ambulatory Visit: Payer: Self-pay

## 2020-10-02 DIAGNOSIS — D6859 Other primary thrombophilia: Secondary | ICD-10-CM | POA: Diagnosis not present

## 2020-10-02 DIAGNOSIS — Z7901 Long term (current) use of anticoagulants: Secondary | ICD-10-CM

## 2020-10-02 LAB — POCT INR: INR: 3.4 — AB (ref 2.0–3.0)

## 2020-10-02 NOTE — Progress Notes (Signed)
I have reviewed and agree with note, evaluation, plan.   Corrin Hingle, MD  

## 2020-10-02 NOTE — Patient Instructions (Signed)
Pre visit review using our clinic review tool, if applicable. No additional management support is needed unless otherwise documented below in the visit note.  Take 1/2 tablet today and then continue to take 1 tablet daily except 1/2 tablet on Mondays.  Re-check in 6 weeks.  

## 2020-10-18 ENCOUNTER — Telehealth: Payer: Self-pay | Admitting: Oncology

## 2020-10-18 NOTE — Telephone Encounter (Signed)
Called pt per 12/8 sch msg - left message for pt to call back to reschedule.   

## 2020-10-21 ENCOUNTER — Telehealth: Payer: Self-pay

## 2020-10-21 NOTE — Telephone Encounter (Signed)
Returned call to pt about testing positive for Covid on 10/20/20. Pt verified she is positive for Covid. Explained to pt we will push out lab appointment for 14 days and then schedule MD appointment 1 week later. Pt verbalized understanding.

## 2020-10-22 ENCOUNTER — Telehealth (INDEPENDENT_AMBULATORY_CARE_PROVIDER_SITE_OTHER): Payer: Medicare Other | Admitting: Family Medicine

## 2020-10-22 ENCOUNTER — Inpatient Hospital Stay: Payer: Medicare Other

## 2020-10-22 DIAGNOSIS — U071 COVID-19: Secondary | ICD-10-CM | POA: Diagnosis not present

## 2020-10-22 MED ORDER — BENZONATATE 100 MG PO CAPS: 100.0000 mg | ORAL_CAPSULE | Freq: Three times a day (TID) | ORAL | 0 refills | Status: DC | PRN

## 2020-10-22 NOTE — Progress Notes (Signed)
Virtual Visit via Telephone Note  I connected with Erin Kirk on 10/22/20 at 10:20 AM EST by telephone and verified that I am speaking with the correct person using two identifiers.   I discussed the limitations, risks, security and privacy concerns of performing an evaluation and management service by telephone and the availability of in person appointments. I also discussed with the patient that there may be a patient responsible charge related to this service. The patient expressed understanding and agreed to proceed.  Location patient: home, [place] Location provider: work or home office Participants present for the call: patient, provider Patient did not have a visit with me in the prior 7 days to address this/these issue(s).   History of Present Illness:  Acute telemedicine visit for COVID19: -Onset: 10/18/20 -Symptoms include: sore throat, sinus congestion, PND, headache, mild cough -Denies: fevers, body aches, SOB, CP, vomiting, inability to eat/drink/get out of bed -Has tried: musinex -Pertinent past medical history:patient reports history of stomach cancer (neuroendocrine tumor per PCP notes), hypercoagulable state, A. Fib, HTN, DM -Pertinent medication allergies: guaifenesin, iohexol, augmentin -COVID-19 vaccine status: x2 + booster   Observations/Objective: Patient sounds cheerful and well on the phone. I do not appreciate any SOB. Speech and thought processing are grossly intact. Patient reported vitals:  Assessment and Plan:  COVID-19  -we discussed possible serious and likely etiologies, options for evaluation and workup, limitations of telemedicine visit vs in person visit, treatment, treatment risks and precautions. Pt prefers to treat via telemedicine empirically rather than in person at this moment.  Discussed treatment options, ideal treatment window, potential complications, isolation and precautions for COVID-19.  She declined referral for Covid outpatient  treatment at this time.  She did want a prescription for cough, Tessalon Rx sent.  Other symptomatic care measures summarized in patient instructions. Work/School slipped offered: declined Scheduled follow up with PCP offered: Declined, agrees to call if needed Advised to seek prompt in person care if worsening, new symptoms arise, or if is not improving with treatment. Advised of options for inperson care in case PCP office not available. Did let the patient know that I only do telemedicine shifts for [place] on Tuesdays and Thursdays and advised a follow up visit with PCP or at an [facility] if has further questions or concerns.   Follow Up Instructions:  I did not refer this patient for an OV with me in the next 24 hours for this/these issue(s).  I discussed the assessment and treatment plan with the patient. The patient was provided an opportunity to ask questions and all were answered. The patient agreed with the plan and demonstrated an understanding of the instructions.   I spent 18 minutes on the date of this visit in the care of this patient. See summary of tasks completed to properly care for this patient in the detailed notes above and including counseling, review of PMH, medications, allergies, evaluation of the patient and ordering and instructing patient on testing and care options.     Lucretia Kern, DO

## 2020-10-22 NOTE — Patient Instructions (Signed)
  HOME CARE TIPS:  -[facility] testing information: https://www.[facility].org/ OR [phone] Most pharmacies also offer testing and home test kits.  -I sent the medication(s) we discussed to your pharmacy: Meds ordered this encounter  Medications  . benzonatate (TESSALON PERLES) 100 MG capsule    Sig: Take 1 capsule (100 mg total) by mouth 3 (three) times daily as needed.    Dispense:  20 capsule    Refill:  0     -can use nasal saline a few times per day if you have nasal congestion  -stay hydrated, drink plenty of fluids and eat small healthy meals - avoid dairy  -If the Covid test is positive, check out the [facility] website for more information on home care, transmission and treatment for [place]  -follow up with your doctor in 2-3 days unless improving and feeling better  -stay home while sick, except to seek medical care, and if you have COVID19 ideally it would be best to stay home for a full 10 days since the onset of symptoms PLUS one day of no fever and feeling better. Wear a good mask (such as N95 or KN95) if around others to reduce the risk of transmission.  It was nice to meet you today, and I really hope you are feeling better soon. I help [facility] out with telemedicine visits on Tuesdays and Thursdays and am available for visits on those days. If you have any concerns or questions following this visit please schedule a follow up visit with your Primary Care doctor or seek care at a local urgent care clinic to avoid delays in care.    Seek in person care or schedule a follow up video visit promptly if your symptoms worsen, new concerns arise or you are not improving with treatment. Call 911 and/or seek emergency care if your symptoms are severe or life threatening.

## 2020-10-29 ENCOUNTER — Ambulatory Visit: Payer: Medicare Other | Admitting: Oncology

## 2020-11-05 ENCOUNTER — Inpatient Hospital Stay: Payer: Medicare Other | Attending: Oncology

## 2020-11-05 ENCOUNTER — Other Ambulatory Visit: Payer: Self-pay

## 2020-11-05 DIAGNOSIS — Z853 Personal history of malignant neoplasm of breast: Secondary | ICD-10-CM | POA: Diagnosis not present

## 2020-11-05 DIAGNOSIS — K869 Disease of pancreas, unspecified: Secondary | ICD-10-CM | POA: Insufficient documentation

## 2020-11-05 DIAGNOSIS — E785 Hyperlipidemia, unspecified: Secondary | ICD-10-CM | POA: Diagnosis not present

## 2020-11-05 DIAGNOSIS — Z85820 Personal history of malignant melanoma of skin: Secondary | ICD-10-CM | POA: Insufficient documentation

## 2020-11-05 DIAGNOSIS — R109 Unspecified abdominal pain: Secondary | ICD-10-CM | POA: Insufficient documentation

## 2020-11-05 DIAGNOSIS — C7A8 Other malignant neuroendocrine tumors: Secondary | ICD-10-CM

## 2020-11-05 DIAGNOSIS — Z8542 Personal history of malignant neoplasm of other parts of uterus: Secondary | ICD-10-CM | POA: Insufficient documentation

## 2020-11-05 DIAGNOSIS — E119 Type 2 diabetes mellitus without complications: Secondary | ICD-10-CM | POA: Insufficient documentation

## 2020-11-05 DIAGNOSIS — C7A092 Malignant carcinoid tumor of the stomach: Secondary | ICD-10-CM | POA: Insufficient documentation

## 2020-11-05 DIAGNOSIS — D500 Iron deficiency anemia secondary to blood loss (chronic): Secondary | ICD-10-CM

## 2020-11-05 LAB — CBC WITH DIFFERENTIAL (CANCER CENTER ONLY)
Abs Immature Granulocytes: 0.02 10*3/uL (ref 0.00–0.07)
Basophils Absolute: 0.1 10*3/uL (ref 0.0–0.1)
Basophils Relative: 1 %
Eosinophils Absolute: 0.4 10*3/uL (ref 0.0–0.5)
Eosinophils Relative: 6 %
HCT: 38.4 % (ref 36.0–46.0)
Hemoglobin: 13.1 g/dL (ref 12.0–15.0)
Immature Granulocytes: 0 %
Lymphocytes Relative: 28 %
Lymphs Abs: 1.9 10*3/uL (ref 0.7–4.0)
MCH: 29.8 pg (ref 26.0–34.0)
MCHC: 34.1 g/dL (ref 30.0–36.0)
MCV: 87.5 fL (ref 80.0–100.0)
Monocytes Absolute: 0.5 10*3/uL (ref 0.1–1.0)
Monocytes Relative: 7 %
Neutro Abs: 4 10*3/uL (ref 1.7–7.7)
Neutrophils Relative %: 58 %
Platelet Count: 188 10*3/uL (ref 150–400)
RBC: 4.39 MIL/uL (ref 3.87–5.11)
RDW: 12.5 % (ref 11.5–15.5)
WBC Count: 7 10*3/uL (ref 4.0–10.5)
nRBC: 0 % (ref 0.0–0.2)

## 2020-11-07 LAB — CHROMOGRANIN A: Chromogranin A (ng/mL): 511.8 ng/mL — ABNORMAL HIGH (ref 0.0–101.8)

## 2020-11-13 ENCOUNTER — Ambulatory Visit (INDEPENDENT_AMBULATORY_CARE_PROVIDER_SITE_OTHER): Payer: Medicare Other | Admitting: General Practice

## 2020-11-13 ENCOUNTER — Other Ambulatory Visit: Payer: Self-pay

## 2020-11-13 DIAGNOSIS — D6859 Other primary thrombophilia: Secondary | ICD-10-CM | POA: Diagnosis not present

## 2020-11-13 DIAGNOSIS — Z7901 Long term (current) use of anticoagulants: Secondary | ICD-10-CM

## 2020-11-13 LAB — POCT INR: INR: 2.8 (ref 2.0–3.0)

## 2020-11-13 NOTE — Patient Instructions (Addendum)
Pre visit review using our clinic review tool, if applicable. No additional management support is needed unless otherwise documented below in the visit note.  Continue to take 1 tablet daily except 1/2 tablet on Mondays.  Re-check in 6 weeks.

## 2020-11-13 NOTE — Progress Notes (Signed)
I have reviewed and agree with note, evaluation, plan.   Stephen Hunter, MD  

## 2020-11-15 ENCOUNTER — Telehealth: Payer: Self-pay | Admitting: Oncology

## 2020-11-15 ENCOUNTER — Other Ambulatory Visit: Payer: Self-pay

## 2020-11-15 ENCOUNTER — Inpatient Hospital Stay: Payer: Medicare Other | Admitting: Oncology

## 2020-11-15 VITALS — BP 128/82 | HR 73 | Temp 97.8°F | Resp 20 | Ht 64.0 in | Wt 194.2 lb

## 2020-11-15 DIAGNOSIS — K869 Disease of pancreas, unspecified: Secondary | ICD-10-CM | POA: Diagnosis not present

## 2020-11-15 DIAGNOSIS — Z853 Personal history of malignant neoplasm of breast: Secondary | ICD-10-CM | POA: Diagnosis not present

## 2020-11-15 DIAGNOSIS — R109 Unspecified abdominal pain: Secondary | ICD-10-CM | POA: Diagnosis not present

## 2020-11-15 DIAGNOSIS — C7A092 Malignant carcinoid tumor of the stomach: Secondary | ICD-10-CM

## 2020-11-15 DIAGNOSIS — E119 Type 2 diabetes mellitus without complications: Secondary | ICD-10-CM | POA: Diagnosis not present

## 2020-11-15 DIAGNOSIS — E785 Hyperlipidemia, unspecified: Secondary | ICD-10-CM | POA: Diagnosis not present

## 2020-11-15 DIAGNOSIS — Z85820 Personal history of malignant melanoma of skin: Secondary | ICD-10-CM | POA: Diagnosis not present

## 2020-11-15 NOTE — Progress Notes (Signed)
[name] OFFICE PROGRESS NOTE   Diagnosis: Carcinoid tumor  INTERVAL HISTORY:   Erin Kirk returns as scheduled.  She reports a poor appetite, but she is eating.  She continues iron.  Iron causes constipation and she takes a stool softener.  No consistent diarrhea or flushing.  She has intermittent discomfort in the left abdomen.  Objective:  Vital signs in last 24 hours:  Blood pressure 128/82, pulse 73, temperature 97.8 F (36.6 C), temperature source Tympanic, resp. rate 20, height 5\' 4"  (1.626 m), weight 194 lb 3.2 oz (88.1 kg), SpO2 99 %.    Lymphatics: No cervical, supraclavicular, axillary, or inguinal nodes Resp: Lungs clear bilaterally Cardio: Regular rate and rhythm GI: No mass, no hepatosplenomegaly, mild tenderness in the left lower abdomen Vascular: No leg edema   Lab Results:  Lab Results  Component Value Date   WBC 7.0 11/05/2020   HGB 13.1 11/05/2020   HCT 38.4 11/05/2020   MCV 87.5 11/05/2020   PLT 188 11/05/2020   NEUTROABS 4.0 11/05/2020    CMP  Lab Results  Component Value Date   NA 141 08/19/2020   K 3.6 08/19/2020   CL 101 08/19/2020   CO2 29 08/19/2020   GLUCOSE 85 08/19/2020   BUN 15 08/19/2020   CREATININE 1.16 (H) 08/19/2020   CALCIUM 9.1 08/19/2020   PROT 6.9 08/19/2020   ALBUMIN 4.8 02/14/2020   AST 15 08/19/2020   ALT 17 08/19/2020   ALKPHOS 69 02/14/2020   BILITOT 0.5 08/19/2020   GFRNONAA 49 (L) 08/19/2020   GFRAA 57 (L) 08/19/2020    Chromogranin A on 11/05/2020: 511.8 Medications: I have reviewed the patient's current medications.   Assessment/Plan: 1. Multiple gastric carcinoid tumors  Upper endoscopy 05/05/2019 with resection of multiple gastric carcinoid tumors  EUS 07/12/2019-resection of multiple gastric carcinoid tumors, positive resection margin at the gastric cardia polypectomy site, tubular adenomas and hyperplastic polyps removed from the lesser curvature  Elevated chromogranin A  level  CT abdomen/pelvis 05/18/2019-no evidence of a gastric mass or metastatic disease  Netspot 08/11/2019-focal activity corresponding to a 2-3 mm lesion in the mid pancreas, diffuse activity in the stomach  EGD 10/09/2019- 3 areas of scar/clip/polypoid tissue removed with negative pathology  EUS 10/09/2019-4 mm pancreas body lesion, FNA biopsy negative, no adenopathy  EUS 06/19/2020-no evidence of recurrent tumor in areas of gastric body scars, nodularity was noted underneath 2 endoclips in the greater curvature, this area was biopsied and polyps were removed from the middle gastric body and proximal gastric antrum, biopsies from erosions and erythema were taken at the greater curvature, lesser curvature, and antrum.  An irregular lesion was identified in the pancreas body measuring 3.4 x 4.6 mm, the pancreas lesion was biopsied.  The pathology from the pancreas FNA was negative, the pathology from multiple biopsies including the gastric body scar, antrum polyp, greater curvature biopsy, lesser curvature biopsy, and gastric cardia biopsy revealed low-grade neuroendocrine tumor 2. History of iron deficiency anemia secondary bleeding from #1 and maintained on anticoagulation therapy 3. 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 Diabetes 5. Abdominal pain-potentially related to #1 6.   Hypothyroidism 7.  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  Family history of uterine and breast cancer 9.  Gastric and colonic adenomas 10.  Hypodense pancreas lesion 05/18/2019 -positive Netspot uptake 08/11/2019, negative FNA biopsy   Disposition: Erin Kirk appears stable.  She has diffuse carcinoid tumor involving the stomach.  There is also a Netspot  avid small lesion in the pancreas body.  She appears asymptomatic from the carcinoid tumor.  The chromogranin A was lower on 11/05/2020.  I recommend continued observation.  We discussed the indication for a repeat Netspot  or CT evaluation.  She understands imaging studies were not therapeutic and it may be difficult to evaluate for disease progression based on the small tumor size in the pancreas.  I will consult with Dr. Rush Landmark.  We will consider obtaining a repeat CT later this year.  She will return for an office visit and chromogranin a level in 4 months.  Betsy Coder, MD  11/15/2020  10:36 AM

## 2020-11-15 NOTE — Telephone Encounter (Signed)
Scheduled appointments per 2/25 los. Spoke to patient who is aware of appointments dates and times.  

## 2020-11-22 ENCOUNTER — Telehealth: Payer: Self-pay | Admitting: Gastroenterology

## 2020-11-22 NOTE — Telephone Encounter (Signed)
Dr. Rush Landmark, please see previous note.

## 2020-11-22 NOTE — Telephone Encounter (Signed)
Patient called requesting to speak with you regarding a conversation between Dr. Rush Landmark and Dr. Benay Spice on what the next steps are for her. Said she will not be home until 4:15pm

## 2020-11-23 NOTE — Telephone Encounter (Signed)
Will call some point next week. GM

## 2020-11-25 NOTE — Telephone Encounter (Signed)
Pt has been informed that you will give her a call at some point this week. Pt asked if possible to call after 4:15pm because she has to pick-up her grandkids in the afternoon.

## 2020-11-26 NOTE — Telephone Encounter (Signed)
Recall in system °

## 2020-11-26 NOTE — Telephone Encounter (Signed)
I called and spoke with patient on 11/25/20 PM. Erin Kirk over [facility] discussion. Plan for repeat EGD in 9/22 by Dr. Carlean Purl or myself. She is appreciative for care received.   Justice Britain, MD [facility] Office # [phone]

## 2020-12-01 ENCOUNTER — Other Ambulatory Visit: Payer: Self-pay | Admitting: Family Medicine

## 2020-12-25 ENCOUNTER — Ambulatory Visit (INDEPENDENT_AMBULATORY_CARE_PROVIDER_SITE_OTHER): Payer: Medicare Other | Admitting: General Practice

## 2020-12-25 ENCOUNTER — Other Ambulatory Visit: Payer: Self-pay

## 2020-12-25 DIAGNOSIS — Z7901 Long term (current) use of anticoagulants: Secondary | ICD-10-CM

## 2020-12-25 DIAGNOSIS — D6859 Other primary thrombophilia: Secondary | ICD-10-CM | POA: Diagnosis not present

## 2020-12-25 LAB — POCT INR: INR: 3.5 — AB (ref 2.0–3.0)

## 2020-12-25 NOTE — Progress Notes (Signed)
I have reviewed and agree with note, evaluation, plan.   Sayyid Harewood, MD  

## 2020-12-25 NOTE — Patient Instructions (Addendum)
Pre visit review using our clinic review tool, if applicable. No additional management support is needed unless otherwise documented below in the visit note.  Skip dosage today and then continue to take 1 tablet daily except 1/2 tablet on Mondays.  Re-check in 6 weeks.

## 2021-01-06 ENCOUNTER — Telehealth: Payer: Self-pay | Admitting: Family Medicine

## 2021-01-06 NOTE — Chronic Care Management (AMB) (Signed)
  Chronic Care Management   Outreach Note  01/06/2021 Name: DIMITRA WOODSTOCK MRN: [id number] DOB: 06/22/1956  Referred by: Marin Olp, MD Reason for referral : No chief complaint on file.   An unsuccessful telephone outreach was attempted today. The patient was referred to the pharmacist for assistance with care management and care coordination.   Follow Up Plan:   Lauretta Grill Upstream Scheduler

## 2021-01-08 DIAGNOSIS — E119 Type 2 diabetes mellitus without complications: Secondary | ICD-10-CM | POA: Diagnosis not present

## 2021-01-08 LAB — HM DIABETES EYE EXAM

## 2021-01-23 ENCOUNTER — Telehealth: Payer: Self-pay | Admitting: Family Medicine

## 2021-01-23 NOTE — Chronic Care Management (AMB) (Signed)
  Chronic Care Management   Note  01/23/2021 Name: Erin Kirk MRN: [id number] DOB: 02-06-1956  Erin Kirk is a 65 y.o. year old female who is a primary care patient of Marin Olp, MD. I reached out to [name] by phone today in response to a referral sent by Erin Kirk's PCP, Yong Channel Brayton Mars, MD.   Erin Kirk was given information about Chronic Care Management services today including:  1. CCM service includes personalized support from designated clinical staff supervised by her physician, including individualized plan of care and coordination with other care providers 2. 24/7 contact phone numbers for assistance for urgent and routine care needs. 3. Service will only be billed when office clinical staff spend 20 minutes or more in a month to coordinate care. 4. Only one practitioner may furnish and bill the service in a calendar month. 5. The patient may stop CCM services at any time (effective at the end of the month) by phone call to the office staff.   Patient agreed to services and verbal consent obtained.   Follow up plan:   Lauretta Grill Upstream Scheduler

## 2021-02-05 ENCOUNTER — Ambulatory Visit (INDEPENDENT_AMBULATORY_CARE_PROVIDER_SITE_OTHER): Payer: Medicare Other | Admitting: General Practice

## 2021-02-05 ENCOUNTER — Other Ambulatory Visit: Payer: Self-pay

## 2021-02-05 DIAGNOSIS — Z7901 Long term (current) use of anticoagulants: Secondary | ICD-10-CM | POA: Diagnosis not present

## 2021-02-05 LAB — POCT INR: INR: 2.7 (ref 2.0–3.0)

## 2021-02-05 NOTE — Patient Instructions (Addendum)
Pre visit review using our clinic review tool, if applicable. No additional management support is needed unless otherwise documented below in the visit note.  Continue to take 1 tablet daily except 1/2 tablet on Mondays.  Re-check in 6 weeks.  

## 2021-02-05 NOTE — Progress Notes (Signed)
I have reviewed and agree with note, evaluation, plan.   Zylie Mumaw, MD  

## 2021-02-07 ENCOUNTER — Telehealth: Payer: Self-pay

## 2021-02-07 NOTE — Chronic Care Management (AMB) (Signed)
Chronic Care Management Pharmacy Assistant   Name: BRALEY LUCKENBAUGH  MRN: [id number] DOB: August 09, 1956  Erin Kirk is an 65 y.o. year old female who presents for his initial CCM visit with the clinical pharmacist.  Reason for Encounter: Chart Prep/ IQ  Recent office visits:  02/05/21- Meriam Sprague, RN - anti coag visit, Continue to take 1 tablet daily except 1/2 tablet on Mondays. Re-check in 6 weeks. 12/25/20-  Meriam Sprague, RN - anti coag visit, Skip dosage today and then continue to take 1 tablet daily except 1/2 tablet on Mondays. Re-check in 6 weeks. 11/13/20-- Meriam Sprague, RN - anti coag visit, Continue to take 1 tablet daily except 1/2 tablet on Mondays. Re-check in 6 weeks. 10/22/20- Colin Benton, DO ( Video Visit)- seen for covid- 19, started tessalon prn, follow up in 2-3 days with provider unless sxs improved 10/02/20-  Meriam Sprague, RN - Take 1/2 tablet today and then continue to take 1 tablet daily except 1/2 tablet on Mondays. Re-check in 6 weeks. 08/21/20- Meriam Sprague, RN - Continue to take 1 tablet daily except 1/2 tablet on Mondays. Re-check in 6 weeks. 08/19/20- Garret Reddish, MD- seen for chronic conditions,no medication changes, follow up 6 months  Recent consult visits:  11/15/20- Betsy Coder, MD- seen for carcinoid tumor visit, no medication changes, follow up for labs  Hospital visits:  None in previous 6 months  Medications: Outpatient Encounter Medications as of 02/07/2021  Medication Sig Note  . acetaminophen (TYLENOL) 500 MG tablet Take 500 mg by mouth every 6 (six) hours as needed (for pain.).    Marland Kitchen Alum Hydroxide-Mag Trisilicate (GAVISCON) 24-09.7 MG CHEW Chew 1-2 tablets by mouth 2 (two) times daily as needed (indigestion).   . Cholecalciferol (VITAMIN D3) 50 MCG (2000 UT) TABS Take 2,000 Units by mouth daily.   Marland Kitchen dicyclomine (BENTYL) 20 MG tablet Take 1 tablet (20 mg total) by mouth every 6 (six) hours as needed for spasms (abdominal cramps, pain).  11/15/2020: ~ 3/month  . diltiazem (CARDIZEM CD) 360 MG 24 hr capsule Take 1 capsule by mouth at bedtime   . docusate sodium (COLACE) 100 MG capsule Take 200 mg by mouth at bedtime.   . ferrous sulfate 325 (65 FE) MG tablet Take 1 tablet (325 mg total) by mouth daily with breakfast. Lunch and supper 11/15/2020: Takes 1/day  . levothyroxine (SYNTHROID) 100 MCG tablet Take 1 tablet by mouth once daily   . lovastatin (MEVACOR) 10 MG tablet Take 1 tablet (10 mg total) by mouth once a week.   . metoprolol succinate (TOPROL-XL) 25 MG 24 hr tablet Take 1 tablet by mouth once daily (Patient taking differently: Take 25 mg by mouth daily.)   . omeprazole (PRILOSEC) 40 MG capsule Take 1 capsule (40 mg total) by mouth daily.   Marland Kitchen telmisartan (MICARDIS) 40 MG tablet Take 1 tablet by mouth once daily   . triamterene-hydrochlorothiazide (MAXZIDE-25) 37.5-25 MG tablet Take 1 tablet by mouth daily.   . vitamin B-12 (CYANOCOBALAMIN) 1000 MCG tablet Take 1,000 mcg by mouth daily.   Marland Kitchen warfarin (COUMADIN) 5 MG tablet TAKE AS DIRECTED BY ANTICOGULATION CLINIC    No facility-administered encounter medications on file as of 02/07/2021.   Current Documented Medications Acetaminophen 500 mg Alum Hydroxide-Mag Trisilicate 35-32.9 mg VITAMIN D3 50 MCG  Dicyclomine 20 mg Diltiazem 360 mg- 90 DS last filled 09/30/20 Docusate sodium 100 mg Ferrous Sulfate 325 mg Levothyroxine 100 mg- 90 DS last filled 12/02/20 Lovastatin 10 mg-  Metoprolol succinate 25 mg - 90 DS last filled 01/25/21 Omeprazole 40 mg- 90 DS last filled 12/11/20 Telmisartan 40 mg - 90 DS last filled 12/02/20 triamterene-hydrochlorothiazide 37.5-25 MG- 90 DS last filled 12/11/20 Vitamin B-12 1000 mg Warfarin 5 mg- 90 DS last filled 12/02/20  Have you seen any other providers since your last visit?    Any changes in your medications or health?    Any side effects from any medications?    Do you have an symptoms or problems not managed by your  medications?    Any concerns about your health right now?   Has your provider asked that you check blood pressure, blood sugar, or follow special diet at home?    Do you get any type of exercise on a regular basis?    Can you think of a goal you would like to reach for your health?    Do you have any problems getting your medications?   Is there anything that you would like to discuss during the appointment?    Patient declined initial CPP visit, CPP notified   Wilford Sports CPA, [place]

## 2021-02-10 NOTE — Progress Notes (Deleted)
Chronic Care Management Pharmacy Note  02/10/2021 Name:  Erin Kirk MRN:  [id number] DOB:  [birth year]  Subjective: Erin Kirk is an 65 y.o. year old female who is a primary patient of Hunter, Brayton Mars, MD.  The CCM team was consulted for assistance with disease management and care coordination needs.    {CCMTELEPHONEFACETOFACE:21091510} for {CCMINITIALFOLLOWUPCHOICE:21091511} in response to provider referral for pharmacy case management and/or care coordination services.   Consent to Services:  {CCMCONSENTOPTIONS:25074}  Patient Care Team: Marin Olp, MD as PCP - General (Family Medicine) Gatha Mayer, MD as Consulting Physician (Gastroenterology) Mansouraty, Telford Nab., MD as Consulting Physician (Gastroenterology) Ladell Pier, MD as Consulting Physician (Oncology) Harriett Sine, MD as Consulting Physician (Dermatology) Madelin Rear, [facility] as Pharmacist (Pharmacist)  Recent office visits: ***  Recent consult visits: [facility] visits: {Hospital DC Yes/No:21091515}  Objective:  Lab Results  Component Value Date   CREATININE 1.16 (H) 08/19/2020   CREATININE 1.15 03/15/2020   CREATININE 1.45 (H) 02/14/2020    Lab Results  Component Value Date   HGBA1C 6.0 (H) 08/19/2020   Last diabetic Eye exam:  Lab Results  Component Value Date/Time   HMDIABEYEEXA No Retinopathy 12/03/2015 12:00 AM    Last diabetic Foot exam: No results found for: HMDIABFOOTEX      Component Value Date/Time   CHOL 159 08/19/2020 1201   TRIG 98 08/19/2020 1201   TRIG 73 07/22/2006 1114   HDL 63 08/19/2020 1201   CHOLHDL 2.5 08/19/2020 1201   VLDL 27.8 02/14/2020 1146   LDLCALC 78 08/19/2020 1201   LDLDIRECT 39.0 03/10/2019 1038    Hepatic Function Latest Ref Rng & Units 08/19/2020 02/14/2020 07/27/2019  Total Protein 6.1 - 8.1 g/dL 6.9 7.2 6.7  Albumin 3.5 - 5.2 g/dL - 4.8 4.4  AST 10 - 35 U/L $Remo'15 18 11  'IYibB$ ALT 6 - 29 U/L $Remo'17 18 15  'pUVgZ$ Alk Phosphatase 39 - 117  U/L - 69 71  Total Bilirubin 0.2 - 1.2 mg/dL 0.5 0.8 0.6  Bilirubin, Direct 0.0 - 0.3 mg/dL - - -    Lab Results  Component Value Date/Time   TSH 1.70 08/19/2020 12:01 PM   TSH 2.01 02/14/2020 11:46 AM   FREET4 0.83 11/28/2010 11:07 AM   FREET4 0.78 09/29/2010 09:58 AM    CBC Latest Ref Rng & Units 11/05/2020 08/19/2020 06/27/2020  WBC 4.0 - 10.5 K/uL 7.0 7.0 6.7  Hemoglobin 12.0 - 15.0 g/dL 13.1 13.2 12.6  Hematocrit 36.0 - 46.0 % 38.4 39.0 38.0  Platelets 150 - 400 K/uL 188 210 210    Lab Results  Component Value Date/Time   VD25OH 57.86 02/14/2020 11:46 AM   VD25OH 57.12 07/27/2019 08:37 AM    Clinical ASCVD: {YES/NO:21197} The 10-year ASCVD risk score Mikey Bussing DC Jr., et al., 2013) is: 13.2%   Values used to calculate the score:     Age: 11 years     Sex: Female     Is Non-Hispanic African American: No     Diabetic: Yes     Tobacco smoker: No     Systolic Blood Pressure: 330 mmHg     Is BP treated: Yes     HDL Cholesterol: 63 mg/dL     Total Cholesterol: 159 mg/dL    Other: (CHADS2VASc if Afib, PHQ9 if depression, MMRC or CAT for COPD, ACT, DEXA)  Social History   Tobacco Use  Smoking Status Never Smoker  Smokeless Tobacco Never Used  BP Readings from Last 3 Encounters:  11/15/20 128/82  08/19/20 116/74  06/27/20 131/76   Pulse Readings from Last 3 Encounters:  11/15/20 73  08/19/20 72  06/27/20 70   Wt Readings from Last 3 Encounters:  11/15/20 194 lb 3.2 oz (88.1 kg)  08/19/20 197 lb (89.4 kg)  06/27/20 194 lb 8 oz (88.2 kg)    Assessment: Review of patient past medical history, allergies, medications, health status, including review of consultants reports, laboratory and other test data, was performed as part of comprehensive evaluation and provision of chronic care management services.   SDOH:  (Social Determinants of Health) assessments and interventions performed:    CCM Care Plan  Allergies  Allergen Reactions  . Guaifenesin & Derivatives  Nausea And Vomiting    Hallucinations  . Iohexol Anaphylaxis, Hives and Shortness Of Breath    Incident 1990, premedicated prior to obtaining IV contrast since.  . Amoxicillin-Pot Clavulanate Swelling and Other (See Comments)    Facial swelling Did it involve swelling of the face/tongue/throat, SOB, or low BP? Yes Did it involve sudden or severe rash/hives, skin peeling, or any reaction on the inside of your mouth or nose? No Did you need to seek medical attention at a hospital or doctor's office? No When did it last happen?~7-8 years ago If all above answers are "NO", may proceed with cephalosporin use.   . Other     Contrast dye    Medications Reviewed Today    Reviewed by Ladell Pier, MD (Physician) on 11/15/20 at [place] List Status: <None>  Medication Order Taking? Sig Documenting Provider Last Dose Status Informant  acetaminophen (TYLENOL) 500 MG tablet [id number] Yes Take 500 mg by mouth every 6 (six) hours as needed (for pain.).  [provider] Taking Active Self  Alum Hydroxide-Mag Trisilicate (GAVISCON) 29-92.4 MG CHEW [id number] Yes Chew 1-2 tablets by mouth 2 (two) times daily as needed (indigestion). [provider] Taking Active Self        Discontinued 11/15/20 1020 (No longer needed (for PRN medications))   Cholecalciferol (VITAMIN D3) 50 MCG (2000 UT) TABS [id number] Yes Take 2,000 Units by mouth daily. [provider] Taking Active Self  dicyclomine (BENTYL) 20 MG tablet [id number] Yes Take 1 tablet (20 mg total) by mouth every 6 (six) hours as needed for spasms (abdominal cramps, pain). Gatha Mayer, MD Taking Active Self           Med Note Michaelle Copas, Mauri Reading   Fri Nov 15, 2020 10:21 AM) ~ 3/month  diltiazem (CARDIZEM CD) 360 MG 24 hr capsule [id number] Yes Take 1 capsule by mouth at bedtime Marin Olp, MD Taking Active   docusate sodium (COLACE) 100 MG capsule [id number] Yes Take 200 mg by mouth at bedtime. [provider]  Taking Active Self  ferrous sulfate 325 (65 FE) MG tablet [id number] Yes Take 1 tablet (325 mg total) by mouth daily with breakfast. Lunch and supper Mansouraty, Telford Nab., MD Taking Active            Med Note Michaelle Copas, Mauri Reading   Fri Nov 15, 2020 10:21 AM) Dewaine Conger 1/day  levothyroxine (SYNTHROID) 100 MCG tablet [id number] Yes Take 1 tablet by mouth once daily Marin Olp, MD Taking Active   lovastatin (MEVACOR) 10 MG tablet [id number] Yes Take 1 tablet (10 mg total) by mouth once a week. Marin Olp, MD Taking Active Self  metoprolol succinate (TOPROL-XL) 25 MG 24 hr tablet [id number] Yes  Take 1 tablet by mouth once daily  Patient taking differently: Take 25 mg by mouth daily.   Marin Olp, MD Taking Active Self  omeprazole (PRILOSEC) 40 MG capsule [id number] Yes Take 1 capsule (40 mg total) by mouth daily. Mansouraty, Telford Nab., MD Taking Active   telmisartan (MICARDIS) 40 MG tablet [id number] Yes Take 1 tablet (40 mg total) by mouth daily.  Patient taking differently: Take 40 mg by mouth at bedtime.   Marin Olp, MD Taking Active Self  triamterene-hydrochlorothiazide [facility]) 37.5-25 MG tablet [id number] Yes Take 1 tablet by mouth daily. Marin Olp, MD Taking Active   vitamin B-12 (CYANOCOBALAMIN) 1000 MCG tablet [id number] Yes Take 1,000 mcg by mouth daily. [provider] Taking Active Self  warfarin (COUMADIN) 5 MG tablet [id number] Yes TAKE AS DIRECTED BY ANTICOAGULATION CLINIC Marin Olp, MD Taking Active           Patient Active Problem List   Diagnosis Date Noted  . Pancreatic lesion 04/21/2020  . Abnormal finding on radiology exam 04/21/2020  . Intestinal metaplasia of gastric mucosa 04/21/2020  . Osteopenia 02/26/2020  . Acquired thrombophilia ([place]) 02/14/2020  . Myalgia due to statin 02/14/2020  . Genetic testing 08/15/2019  . Family history of breast cancer   . Family history of ovarian cancer   . Family history of uterine  cancer   . Family history of lung cancer   . Family history of bone cancer   . Family history of testicular cancer   . Vitamin D deficiency 07/20/2019  . 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 Abnormal findings on esophagogastroduodenoscopy (EGD) 07/02/2019  . Elevated Chromogranin 07/02/2019  . 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 Multiple gastric polyps 07/02/2019  . 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 Iron deficiency anemia 03/30/2019  . Hyperlipidemia associated with type 2 diabetes mellitus ([place]) 03/09/2019  . Cough variant asthma 10/14/2018  . Neuropathy due to medical condition ([place]) 11/09/2017  . Long term (current) use of anticoagulants - warfarin 06/02/2017  . Low vitamin B12 level 09/07/2016  . Hx of adenomatous polyp of colon 04/10/2016  . Disability examination 01/17/2015  . CKD (chronic kidney disease), stage III ([place]) 09/05/2014  . Encounter for therapeutic drug monitoring 10/26/2013  . Left facial pain 09/24/2012  . Oral mucosal lesion 09/24/2012  . Diabetes mellitus type 2 in obese ([place]) 11/28/2010  . PAROXYSMAL ATRIAL FIBRILLATION 07/02/2010  . TRANSIENT DISORDER INITIATING/MAINTAINING SLEEP 04/02/2010  . Overweight(278.02) 11/29/2009  . LOW BACK PAIN 06/15/2008  . Primary hypercoagulable state ([place]) 11/30/2007  . Hypothyroidism 03/09/2007  . Hypertension associated with diabetes ([place]) 03/09/2007  . ALLERGIC RHINITIS 03/09/2007  . GERD 03/09/2007  . History of Melanoma 03/09/2007    Immunization History  Administered Date(s) Administered  . Influenza Split 06/30/2011, 06/29/2012  . Influenza Whole 07/26/2007, 06/15/2008, 07/25/2009  . Influenza, High Dose Seasonal PF 06/23/2019  . Influenza,inj,Quad PF,6+ Mos 07/10/2013, 06/13/2014, 06/14/2015, 05/14/2016, 05/27/2017, 07/19/2018  . Influenza-Unspecified 05/23/2020  . PFIZER(Purple Top)SARS-COV-2 Vaccination 10/14/2019, 11/04/2019, 06/28/2020  .  Pneumococcal Polysaccharide-23 07/26/2014, 02/14/2020  . Td 09/22/2003  . Tdap 03/18/2018    Conditions to be addressed/monitored: {CCM ASSESSMENT DISEASE OPTIONS:25047}  There are no care plans that you recently modified to display for this patient.   Medication Assistance: {MEDASSISTANCEINFO:25044}  Patient's preferred pharmacy is:  [facility] [address], [place] - [address] [place] [place] [address] Phone: [phone] Fax: [phone]  Uses pill box? {  Yes or If no, why not?:20788} Pt endorses ***% compliance  Follow Up:  {FOLLOWUP:24991}  Plan: {CM FOLLOW UP PLAN:25073}  SIG***

## 2021-02-11 ENCOUNTER — Telehealth: Payer: Medicare Other

## 2021-02-13 NOTE — Patient Instructions (Addendum)
Health Maintenance Due  Topic Date Due  . Zoster Vaccines- Shingrix (1 of 2)  Please check with your pharmacy to see if they have the shingrix vaccine. If they do- please get this immunization and update [place] by phone call or mychart with dates you receive the vaccine  Never done  . OPHTHALMOLOGY EXAM - Sign release of information at the check out desk for 12/02/2016  . COVID-19 Vaccine (4 - Booster for [employer] series)- had covid in February- I am ok waiting 6 months on this 09/28/2020   Please stop by lab before you go If you have mychart- we will send your results within 3 business days of [place] receiving them.  If you do not have mychart- we will call you about results within 5 business days of [place] receiving them.  *please also note that you will see labs on mychart as soon as they post. I will later go in and write notes on them- will say "notes from Dr. Yong Channel"

## 2021-02-13 NOTE — Progress Notes (Signed)
Phone [phone]   Subjective:  Patient presents today for their annual physical. Chief complaint-noted.   See problem oriented charting- ROS- full  review of systems was completed and negative except for: rare heartburn as long as takes prilosec- if misses can fill it, myalgias, some knee pain, easy bruising,   The following were reviewed and entered/updated in epic: Past Medical History:  Diagnosis Date  . Allergy   . Anxiety   . Arthritis   . Asthma    many years ago  . Blood transfusion without reported diagnosis   . Cataract   . Clotting disorder ([place])   . Depression    Husband died June 18, 2019  . Diabetes ([place])    not on medication  . Dysrhythmia   . Family history of bone cancer   . Family history of breast cancer   . Family history of lung cancer   . Family history of ovarian cancer   . Family history of testicular cancer   . Family history of uterine cancer   . GERD (gastroesophageal reflux disease)   . Hypertension   . Hypothyroidism   . Iron deficiency anemia 03/30/2019  . Irregular heart beat    tachycardia  . Lupus anticoagulant with hypercoagulable state ([place])   . Melanoma ([place])    2005- face   . Pneumonia 2019  . Primary hypercoagulable state ([place])   . 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 Pulmonary embolism ([place])   . Thyroid disease    hypothyroidism   Patient Active Problem List   Diagnosis Date Noted  . Neuroendocrine carcinoma of stomach ([place]) 07/12/2019    Priority: High  . Elevated Chromogranin 07/02/2019    Priority: High  . 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 Low vitamin B12 level 09/07/2016    Priority: High  . Disability examination 01/17/2015    Priority: High  . Diabetes mellitus type 2 in obese ([place]) 11/28/2010    Priority: High  . PAROXYSMAL ATRIAL FIBRILLATION 07/02/2010    Priority: High  . Primary hypercoagulable  state ([place]) 11/30/2007    Priority: High  . Osteopenia 02/26/2020    Priority: Medium  . Vitamin D deficiency 07/20/2019    Priority: Medium  . Iron deficiency anemia 03/30/2019    Priority: Medium  . Hyperlipidemia associated with type 2 diabetes mellitus ([place]) 03/09/2019    Priority: Medium  . Cough variant asthma 10/14/2018    Priority: Medium  . Neuropathy due to medical condition ([place]) 11/09/2017    Priority: Medium  . Hx of adenomatous polyp of colon 04/10/2016    Priority: Medium  . CKD (chronic kidney disease), stage III ([place]) 09/05/2014    Priority: Medium  . Hypothyroidism 03/09/2007    Priority: Medium  . Hypertension associated with diabetes ([place]) 03/09/2007    Priority: Medium  . History of Melanoma 03/09/2007    Priority: Medium  . Abnormal findings on esophagogastroduodenoscopy (EGD) 07/02/2019    Priority: Low  . 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    Priority: Low  . Multiple gastric polyps 07/02/2019    Priority: Low  . Long term (current) use of anticoagulants - warfarin 06/02/2017    Priority: Low  . Encounter for therapeutic drug monitoring 10/26/2013    Priority: Low  . Left facial pain 09/24/2012    Priority: Low  . Oral mucosal lesion 09/24/2012    Priority: Low  . TRANSIENT DISORDER INITIATING/MAINTAINING SLEEP 04/02/2010  Priority: Low  . Overweight(278.02) 11/29/2009    Priority: Low  . LOW BACK PAIN 06/15/2008    Priority: Low  . ALLERGIC RHINITIS 03/09/2007    Priority: Low  . GERD 03/09/2007    Priority: Low  . Pancreatic lesion 04/21/2020  . Abnormal finding on radiology exam 04/21/2020  . Intestinal metaplasia of gastric mucosa 04/21/2020  . Acquired thrombophilia ([place]) 02/14/2020  . Myalgia due to statin 02/14/2020  . Genetic testing 08/15/2019  . Family history of breast cancer   . Family history of ovarian cancer   . Family history of uterine cancer   . Family history of lung cancer   . Family history of bone cancer   . Family  history of testicular cancer    Past Surgical History:  Procedure Laterality Date  . ABDOMINAL HYSTERECTOMY     noncancerous  . BACK SURGERY  1983  . BIOPSY  06/19/2020   Procedure: BIOPSY;  Surgeon: Rush Landmark Telford Nab., MD;  Location: [facility];  Service: Gastroenterology;;  . CHOLECYSTECTOMY    . COLONOSCOPY    . ENDOSCOPIC MUCOSAL RESECTION N/A 07/12/2019   Procedure: ENDOSCOPIC MUCOSAL RESECTION;  Surgeon: Rush Landmark Telford Nab., MD;  Location: [facility];  Service: Gastroenterology;  Laterality: N/A;  . ENDOSCOPIC MUCOSAL RESECTION  06/19/2020   Procedure: ENDOSCOPIC MUCOSAL RESECTION;  Surgeon: Rush Landmark Telford Nab., MD;  [facility]: [facility];  Service: Gastroenterology;;  . ESOPHAGOGASTRODUODENOSCOPY N/A 06/19/2020   Procedure: ESOPHAGOGASTRODUODENOSCOPY (EGD);  Surgeon: Irving Copas., MD;  Location: [facility];  Service: Gastroenterology;  Laterality: N/A;  . ESOPHAGOGASTRODUODENOSCOPY (EGD) WITH PROPOFOL N/A 07/12/2019   Procedure: ESOPHAGOGASTRODUODENOSCOPY (EGD) WITH PROPOFOL;  Surgeon: Rush Landmark Telford Nab., MD;  Location: [facility];  Service: Gastroenterology;  Laterality: N/A;  . ESOPHAGOGASTRODUODENOSCOPY (EGD) WITH PROPOFOL N/A 10/09/2019   Procedure: ESOPHAGOGASTRODUODENOSCOPY (EGD) WITH PROPOFOL;  Surgeon: Rush Landmark Telford Nab., MD;  Location: [facility];  Service: Gastroenterology;  Laterality: N/A;  . EUS N/A 07/12/2019   Procedure: UPPER ENDOSCOPIC ULTRASOUND (EUS) RADIAL;  Surgeon: Rush Landmark Telford Nab., MD;  Location: [facility];  Service: Gastroenterology;  Laterality: N/A;  . EUS N/A 10/09/2019   Procedure: UPPER ENDOSCOPIC ULTRASOUND (EUS) RADIAL;  Surgeon: Rush Landmark Telford Nab., MD;  Location: [facility];  Service: Gastroenterology;  Laterality: N/A;  . fatty tumor breast Right    removed  . FINE NEEDLE ASPIRATION  06/19/2020   Procedure: FINE NEEDLE ASPIRATION (FNA) LINEAR;  Surgeon: Irving Copas., MD;  Location:  [facility];  Service: Gastroenterology;;  . FOREIGN BODY REMOVAL  10/09/2019   Procedure: FOREIGN BODY REMOVAL;  Surgeon: Rush Landmark Telford Nab., MD;  Location: [facility];  Service: Gastroenterology;;  . FOREIGN BODY REMOVAL  06/19/2020   Procedure: FOREIGN BODY REMOVAL;  Surgeon: Irving Copas., MD;  [facility];  Service: Gastroenterology;;  polyp removal with net   . HEMOSTASIS CLIP PLACEMENT  07/12/2019   Procedure: HEMOSTASIS CLIP PLACEMENT;  Surgeon: Irving Copas., MD;  Location: [facility];  Service: Gastroenterology;;  . HEMOSTASIS CLIP PLACEMENT  10/09/2019   Procedure: HEMOSTASIS CLIP PLACEMENT;  Surgeon: Irving Copas., MD;  Location: [facility];  Service: Gastroenterology;;  . HEMOSTASIS CLIP PLACEMENT  06/19/2020   Procedure: HEMOSTASIS CLIP PLACEMENT;  Surgeon: Irving Copas., MD;  Location: [facility];  Service: Gastroenterology;;  . HEMOSTASIS CONTROL  07/12/2019   Procedure: HEMOSTASIS CONTROL;  Surgeon: Irving Copas., MD;  Location: [facility];  Service: Gastroenterology;;  . [name]    . KNEE ARTHROSCOPY Left   . Lymp nodes  Right 08/2004  . MELANOMA EXCISION Right 2005   face   . OOPHORECTOMY Bilateral   . POLYPECTOMY  07/12/2019   Procedure: POLYPECTOMY;  Surgeon: Rush Landmark Telford Nab., MD;  Location: [facility];  Service: Gastroenterology;;  . POLYPECTOMY  10/09/2019   Procedure: POLYPECTOMY;  Surgeon: Irving Copas., MD;  Location: [facility];  Service: Gastroenterology;;  . POLYPECTOMY  06/19/2020   Procedure: POLYPECTOMY;  Surgeon: Irving Copas., MD;  Location[facility];  Service: Gastroenterology;;  . removal lymphnodes in right neck    . SUBMUCOSAL LIFTING INJECTION  07/12/2019   Procedure: SUBMUCOSAL LIFTING INJECTION;  Surgeon: Rush Landmark Telford Nab., MD;  Location: [facility];  Service: Gastroenterology;;  . Lia Foyer LIFTING INJECTION  10/09/2019    Procedure: SUBMUCOSAL LIFTING INJECTION;  Surgeon: Irving Copas., MD;  Location: [facility];  Service: Gastroenterology;;  . UPPER ESOPHAGEAL ENDOSCOPIC ULTRASOUND (EUS) N/A 06/19/2020   Procedure: UPPER ESOPHAGEAL ENDOSCOPIC ULTRASOUND (EUS);  Surgeon: Irving Copas., MD;  Location: [facility];  Service: Gastroenterology;  Laterality: N/A;  . UPPER GASTROINTESTINAL ENDOSCOPY      Family History  Problem Relation Age of Onset  . Coronary artery disease Mother        Mom 37, Dad 57-smoker  . Diabetes Mother   . Ovarian cancer Mother 55       not officially diagnosed  . Hypertension Father   . Diabetes Father   . Mitral valve prolapse Sister   . Diabetes Sister   . Breast cancer Maternal Aunt 84  . Breast cancer Half-Sister 51  . Breast cancer Half-Sister 43  . Uterine cancer Niece 76  . Breast cancer Niece        diagnosed mid-60s  . Lung cancer Nephew   . Bone cancer Nephew 64  . Cancer Niece 96       unknown type  . Alzheimer's disease Maternal Aunt   . Breast cancer Other        maternal great-aunt, diagnosed in her 61s  . Breast cancer Other        4th degree maternal relative, diagnosed in her 23s  . Colon cancer Neg Hx   . Esophageal cancer Neg Hx   . Pancreatic cancer Neg Hx   . Rectal cancer Neg Hx   . Stomach cancer Neg Hx   . Inflammatory bowel disease Neg Hx   . Liver disease Neg Hx     Medications- reviewed and updated Current Outpatient Medications  Medication Sig Dispense Refill  . acetaminophen (TYLENOL) 500 MG tablet Take 500 mg by mouth every 6 (six) hours as needed (for pain.).     Marland Kitchen Alum Hydroxide-Mag Trisilicate (GAVISCON) 17-51.0 MG CHEW Chew 1-2 tablets by mouth 2 (two) times daily as needed (indigestion).    . Cholecalciferol (VITAMIN D3) 50 MCG (2000 UT) TABS Take 2,000 Units by mouth daily.    Marland Kitchen dicyclomine (BENTYL) 20 MG tablet Take 1 tablet (20 mg total) by mouth every 6 (six) hours as needed for spasms (abdominal cramps,  pain). 60 tablet 0  . diltiazem (CARDIZEM CD) 360 MG 24 hr capsule Take 1 capsule by mouth at bedtime 90 capsule 0  . docusate sodium (COLACE) 100 MG capsule Take 200 mg by mouth at bedtime.    . ferrous sulfate 325 (65 FE) MG tablet Take 1 tablet (325 mg total) by mouth daily with breakfast. Lunch and supper 30 tablet 3  . levothyroxine (SYNTHROID) 100 MCG tablet Take 1 tablet by mouth once daily  90 tablet 0  . lovastatin (MEVACOR) 10 MG tablet Take 1 tablet (10 mg total) by mouth once a week. 13 tablet 3  . metoprolol succinate (TOPROL-XL) 25 MG 24 hr tablet Take 1 tablet by mouth once daily (Patient taking differently: Take 25 mg by mouth daily.) 90 tablet 3  . telmisartan (MICARDIS) 40 MG tablet Take 1 tablet by mouth once daily 90 tablet 0  . triamterene-hydrochlorothiazide (MAXZIDE-25) 37.5-25 MG tablet Take 1 tablet by mouth daily. 90 tablet 3  . vitamin B-12 (CYANOCOBALAMIN) 1000 MCG tablet Take 1,000 mcg by mouth daily.    Marland Kitchen warfarin (COUMADIN) 5 MG tablet TAKE AS DIRECTED BY ANTICOGULATION CLINIC 105 tablet 0  . omeprazole (PRILOSEC) 40 MG capsule Take 1 capsule (40 mg total) by mouth daily. 90 capsule 2   No current facility-administered medications for this visit.    Allergies-reviewed and updated Allergies  Allergen Reactions  . Guaifenesin & Derivatives Nausea And Vomiting    Hallucinations  . Iohexol Anaphylaxis, Hives and Shortness Of Breath    Incident 1990, premedicated prior to obtaining IV contrast since.  . Amoxicillin-Pot Clavulanate Swelling and Other (See Comments)    Facial swelling Did it involve swelling of the face/tongue/throat, SOB, or low BP? Yes Did it involve sudden or severe rash/hives, skin peeling, or any reaction on the inside of your mouth or nose? No Did you need to seek medical attention at a hospital or doctor's office? No When did it last happen?~7-8 years ago If all above answers are "NO", may proceed with cephalosporin use.   . Other      Contrast dye    Social History   Social History Narrative   Married 1971. 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    Objective  Objective:  BP 116/70 (BP Location: Left Arm, Patient Position: Sitting, Cuff Size: Large)   Pulse 68   Temp 97.9 F (36.6 C) (Temporal)   Ht 5\' 4"  (1.626 m)   Wt 192 lb 8 oz (87.3 kg)   LMP  (LMP Unknown)   SpO2 97%   BMI 33.04 kg/m  Gen: NAD, resting comfortably HEENT: Mucous membranes are moist. Oropharynx normal Neck: no thyromegaly CV: RRR no murmurs rubs or gallops Lungs: CTAB no crackles, wheeze, rhonchi Abdomen: soft/nontender/nondistended/normal bowel sounds. No rebound or guarding.  Ext: no edema Skin: warm, dry Neuro: grossly normal, moves all extremities, PERRLA    Diabetic Foot Exam - Simple   Simple Foot Form Diabetic Foot exam was performed with the following findings: Yes 02/14/2021 12:26 PM  Visual Inspection No deformities, no ulcerations, no other skin breakdown bilaterally: Yes Sensation Testing Intact to touch and monofilament testing bilaterally: Yes Pulse Check Posterior Tibialis and Dorsalis pulse intact bilaterally: Yes Comments       Assessment and Plan   65 y.o. female presenting for annual physical.  Health Maintenance counseling: 1. Anticipatory guidance: Patient counseled regarding regular dental exams -q6 months, eye exams - yearly (trying to get records),  avoiding smoking and second hand smoke- never , limiting alcohol to 1 beverage per day- does not drink .   2. Risk factor reduction:  Advised patient of need for regular exercise and diet rich and fruits and vegetables to reduce risk of heart attack and  stroke. Exercise- staying active around the home- 1-1.5 hours recumbent bike daily. Diet-some struggles- still some  stress/grief eating. Feels she could do more protein and more veggies. Thinks could cut on snacking. Has lost some weight- discussed gradual weigh tloss Wt Readings from Last 3 Encounters:  02/14/21 192 lb 8 oz (87.3 kg)  11/15/20 194 lb 3.2 oz (88.1 kg)  08/19/20 197 lb (89.4 kg)  3. Immunizations/screenings/ancillary studies- see after visit summary Immunization History  Administered Date(s) Administered  . Influenza Split 06/30/2011, 06/29/2012  . Influenza Whole 07/26/2007, 06/15/2008, 07/25/2009  . Influenza, High Dose Seasonal PF 06/23/2019  . Influenza,inj,Quad PF,6+ Mos 07/10/2013, 06/13/2014, 06/14/2015, 05/14/2016, 05/27/2017, 07/19/2018  . Influenza-Unspecified 05/23/2020  . PFIZER(Purple Top)SARS-COV-2 Vaccination 10/14/2019, 11/04/2019, 06/28/2020  . Pneumococcal Polysaccharide-23 07/26/2014, 02/14/2020  . Td 09/22/2003  . Tdap 03/18/2018   4. Cervical cancer screening- sees Dr. Ronita Hipps- but no pap smears due to hysteretomy for benign resons 5. Breast cancer screening-  breast exam Dr. Ronita Hipps and mammogram - needs to get this set up through Dr. Edwyna Ready for repeat 6. Colon cancer screening -05/05/2019- 3 year repeat but possibly sooner.  7. Skin cancer screening-  History of melanoma- sees derm yearly Dr. Elvera Lennox. advised regular sunscreen use. Denies worrisome, changing, or new skin lesions.  8. Birth control/STD check- hysterectomy and not active 9. Osteoporosis screening at 26- done 2021 osteopenia- recheck in a few years. 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 Rush Landmark . follow chromogranin levels (down last visit- has to be off omeprazole prior to levels )and PET scans. EGD q6-8 months typically.  - last visit 11/15/20 with Dr. Benay Spice- has remained asymptomatic - EGD planned with Dr. Rush Landmark and  additional biopsies/partial resections - also repeat PET scan planned - Pancreatic lesion has not changed  # primary hypercoagulable state with history of multiple DVT and PE #acquired thrombophilia on coumadin S:  patient remains on  Coumadin long term -requires lovenox bridge for procedures.  No leg swelling or SOB A/P: Stable. Continue current medications.    # Atrial fibrillation S: Rate controlled with  diltiazem 360mg  ER Anticoagulated with  warfarin. Also requires lovenox bridge for procedures A/P: Stable. Continue current medications.    # Iron deficiency anemia- iron levels have been low- currently taking daily. Also on high dose PPI through GI. Hopefully improved- update levels today Lab Results  Component Value Date   FERRITIN 9.5 (L) 05/28/2020   #hypertension S: medication:  diltiazem 360mg  XR, metoprolol 25 mg XR, telmisartan 40mg , triamterne hctz 37.5-25 mg Home readings #s: reports "good " numbers A/P: Stable. Continue current medications.   #hyperlipidemia- she prefers to stay on lowest dose possible S: Medication: lovastatin 10 mg once a week. Still tolerating but gets muscle aches Lab Results  Component Value Date   CHOL 159 08/19/2020   HDL 63 08/19/2020   LDLCALC 78 08/19/2020   LDLDIRECT 39.0 03/10/2019   TRIG 98 08/19/2020   CHOLHDL 2.5 08/19/2020   A/P: considering myalgias max tolerable dose- continue current medicine  # Diabetes S: Medication: diet controlled Lab Results  Component Value Date   HGBA1C 6.0 (H) 08/19/2020   A/P: hopefully stable- update a1c today  #hypothyroidism S: compliant On thyroid medication- levothyroxine 100 mcg Lab Results  Component Value Date   TSH 1.70 08/19/2020  A/P:hopefully stable- update TSH today. Continue  current meds   # B12 deficiency S: history of neuropathy with this. Metformin likely contributed. Doing well now on oral medication after time on injectoins.   Lab Results  Component Value Date    VITAMINB12 >1526 (H) 02/14/2020  A/P: hopefully stable- update b12 today. Continue current meds   #Vitamin D deficiency S: Medication: 2000 units daily Last vitamin D Lab Results  Component Value Date   VD25OH 57.86 02/14/2020  A/P:  hopefully stable- update vitamin D today. Continue current meds   Recommended follow up: Return in about 6 months (around 08/17/2021) for follow up- or sooner if needed. Future Appointments  Date Time Provider [place]  03/03/2021 10:00 AM [facility] None  03/10/2021 10:20 AM Ladell Pier, MD [facility] None  03/19/2021 10:15 AM [facility]    Lab/Order associations:   ICD-10-CM   1. Preventative health care  Z00.00   2. Hypertension associated with diabetes ([place])  E11.59    I15.2   3. Hypothyroidism, unspecified type  E03.9 TSH  4. Diabetes mellitus type 2 in obese (H[facility])  E11.69 CBC with Differential/Platelet   E66.9 Comprehensive metabolic panel    Hemoglobin A1c  5. Hyperlipidemia associated with type 2 diabetes mellitus ([facility])  E11.69    E78.5   6. Stage 3 chronic kidney disease, unspecified whether stage 3a or 3b CKD (H[facility])  N18.30   7. Vitamin D deficiency  E55.9 VITAMIN D 25 Hydroxy (Vit-D Deficiency, Fractures)  8. Low vitamin B12 level  E53.8 Vitamin B12  9. Iron deficiency anemia, unspecified iron deficiency anemia type  D50.9 IBC + Ferritin   Return precautions advised.  Garret Reddish, MD

## 2021-02-14 ENCOUNTER — Ambulatory Visit (INDEPENDENT_AMBULATORY_CARE_PROVIDER_SITE_OTHER): Payer: Medicare Other | Admitting: Family Medicine

## 2021-02-14 ENCOUNTER — Encounter: Payer: Self-pay | Admitting: Family Medicine

## 2021-02-14 ENCOUNTER — Other Ambulatory Visit: Payer: Self-pay

## 2021-02-14 VITALS — BP 116/70 | HR 68 | Temp 97.9°F | Ht 64.0 in | Wt 192.5 lb

## 2021-02-14 DIAGNOSIS — E1159 Type 2 diabetes mellitus with other circulatory complications: Secondary | ICD-10-CM

## 2021-02-14 DIAGNOSIS — E785 Hyperlipidemia, unspecified: Secondary | ICD-10-CM

## 2021-02-14 DIAGNOSIS — E669 Obesity, unspecified: Secondary | ICD-10-CM

## 2021-02-14 DIAGNOSIS — E538 Deficiency of other specified B group vitamins: Secondary | ICD-10-CM

## 2021-02-14 DIAGNOSIS — E1169 Type 2 diabetes mellitus with other specified complication: Secondary | ICD-10-CM

## 2021-02-14 DIAGNOSIS — E559 Vitamin D deficiency, unspecified: Secondary | ICD-10-CM | POA: Diagnosis not present

## 2021-02-14 DIAGNOSIS — N183 Chronic kidney disease, stage 3 unspecified: Secondary | ICD-10-CM

## 2021-02-14 DIAGNOSIS — Z0000 Encounter for general adult medical examination without abnormal findings: Secondary | ICD-10-CM

## 2021-02-14 DIAGNOSIS — I152 Hypertension secondary to endocrine disorders: Secondary | ICD-10-CM | POA: Diagnosis not present

## 2021-02-14 DIAGNOSIS — D509 Iron deficiency anemia, unspecified: Secondary | ICD-10-CM

## 2021-02-14 DIAGNOSIS — E039 Hypothyroidism, unspecified: Secondary | ICD-10-CM | POA: Diagnosis not present

## 2021-02-14 LAB — CBC WITH DIFFERENTIAL/PLATELET
Basophils Absolute: 0.1 10*3/uL (ref 0.0–0.1)
Basophils Relative: 1.1 % (ref 0.0–3.0)
Eosinophils Absolute: 0.4 10*3/uL (ref 0.0–0.7)
Eosinophils Relative: 6.9 % — ABNORMAL HIGH (ref 0.0–5.0)
HCT: 39.2 % (ref 36.0–46.0)
Hemoglobin: 13.6 g/dL (ref 12.0–15.0)
Lymphocytes Relative: 30.7 % (ref 12.0–46.0)
Lymphs Abs: 2 10*3/uL (ref 0.7–4.0)
MCHC: 34.8 g/dL (ref 30.0–36.0)
MCV: 87.9 fl (ref 78.0–100.0)
Monocytes Absolute: 0.5 10*3/uL (ref 0.1–1.0)
Monocytes Relative: 7 % (ref 3.0–12.0)
Neutro Abs: 3.6 10*3/uL (ref 1.4–7.7)
Neutrophils Relative %: 54.3 % (ref 43.0–77.0)
Platelets: 192 10*3/uL (ref 150.0–400.0)
RBC: 4.46 Mil/uL (ref 3.87–5.11)
RDW: 13.1 % (ref 11.5–15.5)
WBC: 6.5 10*3/uL (ref 4.0–10.5)

## 2021-02-14 LAB — COMPREHENSIVE METABOLIC PANEL
ALT: 17 U/L (ref 0–35)
AST: 15 U/L (ref 0–37)
Albumin: 4.5 g/dL (ref 3.5–5.2)
Alkaline Phosphatase: 59 U/L (ref 39–117)
BUN: 16 mg/dL (ref 6–23)
CO2: 30 mEq/L (ref 19–32)
Calcium: 9.4 mg/dL (ref 8.4–10.5)
Chloride: 99 mEq/L (ref 96–112)
Creatinine, Ser: 1.11 mg/dL (ref 0.40–1.20)
GFR: 51.62 mL/min — ABNORMAL LOW (ref >60.00)
Glucose, Bld: 100 mg/dL — ABNORMAL HIGH (ref 70–99)
Potassium: 3.6 mEq/L (ref 3.5–5.1)
Sodium: 137 mEq/L (ref 135–145)
Total Bilirubin: 0.8 mg/dL (ref 0.2–1.2)
Total Protein: 7.1 g/dL (ref 6.0–8.3)

## 2021-02-14 LAB — IBC + FERRITIN
Ferritin: 47.5 ng/mL (ref 10.0–291.0)
Iron: 64 ug/dL (ref 42–145)
Saturation Ratios: 16.3 % — ABNORMAL LOW (ref 20.0–50.0)
Transferrin: 281 mg/dL (ref 212.0–360.0)

## 2021-02-14 LAB — VITAMIN D 25 HYDROXY (VIT D DEFICIENCY, FRACTURES): VITD: 42.45 ng/mL (ref 30.00–100.00)

## 2021-02-14 LAB — TSH: TSH: 1.33 u[IU]/mL (ref 0.35–4.50)

## 2021-02-14 LAB — VITAMIN B12: Vitamin B-12: 1122 pg/mL — ABNORMAL HIGH (ref 211–911)

## 2021-02-14 LAB — HEMOGLOBIN A1C: Hgb A1c MFr Bld: 6.1 % (ref 4.6–6.5)

## 2021-02-27 ENCOUNTER — Encounter: Payer: Self-pay | Admitting: Family Medicine

## 2021-03-01 ENCOUNTER — Other Ambulatory Visit: Payer: Self-pay | Admitting: Family Medicine

## 2021-03-03 ENCOUNTER — Inpatient Hospital Stay: Payer: Medicare Other | Attending: Oncology

## 2021-03-03 ENCOUNTER — Inpatient Hospital Stay: Payer: Medicare Other

## 2021-03-03 ENCOUNTER — Other Ambulatory Visit: Payer: Self-pay

## 2021-03-03 DIAGNOSIS — G893 Neoplasm related pain (acute) (chronic): Secondary | ICD-10-CM | POA: Diagnosis not present

## 2021-03-03 DIAGNOSIS — C7A092 Malignant carcinoid tumor of the stomach: Secondary | ICD-10-CM | POA: Diagnosis not present

## 2021-03-03 DIAGNOSIS — E119 Type 2 diabetes mellitus without complications: Secondary | ICD-10-CM | POA: Diagnosis not present

## 2021-03-03 DIAGNOSIS — Z8049 Family history of malignant neoplasm of other genital organs: Secondary | ICD-10-CM | POA: Diagnosis not present

## 2021-03-03 DIAGNOSIS — Z803 Family history of malignant neoplasm of breast: Secondary | ICD-10-CM | POA: Diagnosis not present

## 2021-03-03 DIAGNOSIS — Z85820 Personal history of malignant melanoma of skin: Secondary | ICD-10-CM | POA: Diagnosis not present

## 2021-03-03 DIAGNOSIS — E039 Hypothyroidism, unspecified: Secondary | ICD-10-CM | POA: Insufficient documentation

## 2021-03-03 LAB — CBC WITH DIFFERENTIAL (CANCER CENTER ONLY)
Abs Immature Granulocytes: 0.02 10*3/uL (ref 0.00–0.07)
Basophils Absolute: 0.1 10*3/uL (ref 0.0–0.1)
Basophils Relative: 1 %
Eosinophils Absolute: 0.4 10*3/uL (ref 0.0–0.5)
Eosinophils Relative: 6 %
HCT: 41.9 % (ref 36.0–46.0)
Hemoglobin: 14.1 g/dL (ref 12.0–15.0)
Immature Granulocytes: 0 %
Lymphocytes Relative: 29 %
Lymphs Abs: 1.8 10*3/uL (ref 0.7–4.0)
MCH: 30.1 pg (ref 26.0–34.0)
MCHC: 33.7 g/dL (ref 30.0–36.0)
MCV: 89.5 fL (ref 80.0–100.0)
Monocytes Absolute: 0.4 10*3/uL (ref 0.1–1.0)
Monocytes Relative: 6 %
Neutro Abs: 3.6 10*3/uL (ref 1.7–7.7)
Neutrophils Relative %: 58 %
Platelet Count: 193 10*3/uL (ref 150–400)
RBC: 4.68 MIL/uL (ref 3.87–5.11)
RDW: 12.2 % (ref 11.5–15.5)
WBC Count: 6.3 10*3/uL (ref 4.0–10.5)
nRBC: 0 % (ref 0.0–0.2)

## 2021-03-04 LAB — CHROMOGRANIN A: Chromogranin A (ng/mL): 935.2 ng/mL — ABNORMAL HIGH (ref 0.0–101.8)

## 2021-03-10 ENCOUNTER — Other Ambulatory Visit: Payer: Self-pay

## 2021-03-10 ENCOUNTER — Inpatient Hospital Stay: Payer: Medicare Other | Admitting: Oncology

## 2021-03-10 ENCOUNTER — Ambulatory Visit: Payer: Medicare Other | Admitting: Oncology

## 2021-03-10 VITALS — BP 112/88 | HR 82 | Temp 98.0°F | Resp 18 | Ht 64.0 in | Wt 196.0 lb

## 2021-03-10 DIAGNOSIS — Z85820 Personal history of malignant melanoma of skin: Secondary | ICD-10-CM | POA: Diagnosis not present

## 2021-03-10 DIAGNOSIS — G893 Neoplasm related pain (acute) (chronic): Secondary | ICD-10-CM | POA: Diagnosis not present

## 2021-03-10 DIAGNOSIS — E039 Hypothyroidism, unspecified: Secondary | ICD-10-CM | POA: Diagnosis not present

## 2021-03-10 DIAGNOSIS — C7A092 Malignant carcinoid tumor of the stomach: Secondary | ICD-10-CM | POA: Diagnosis not present

## 2021-03-10 DIAGNOSIS — Z803 Family history of malignant neoplasm of breast: Secondary | ICD-10-CM | POA: Diagnosis not present

## 2021-03-10 DIAGNOSIS — E119 Type 2 diabetes mellitus without complications: Secondary | ICD-10-CM | POA: Diagnosis not present

## 2021-03-10 NOTE — Progress Notes (Signed)
[facility] OFFICE PROGRESS NOTE   Diagnosis: Gastric carcinoid tumor  INTERVAL HISTORY:   Erin Kirk returns for a scheduled visit.  She is taking iron once daily.  She has an occasional episode of diarrhea.  No fever or flushing.  No nausea.  She held Prilosec for 1 week prior to the chromogranin level on 03/03/2021.  Objective:  Vital signs in last 24 hours:  Blood pressure 112/88, pulse 82, temperature 98 F (36.7 C), temperature source Oral, resp. rate 18, height 5\' 4"  (1.626 m), weight 196 lb (88.9 kg), SpO2 98 %.    Lymphatics: No cervical, supraclavicular, axillary, or inguinal nodes Resp: Lungs clear bilaterally Cardio: Regular rate and rhythm GI: No hepatosplenomegaly, no mass, mild tenderness in the bilateral upper abdomen Vascular: No leg edema     Lab Results:  Lab Results  Component Value Date   WBC 6.3 03/03/2021   HGB 14.1 03/03/2021   HCT 41.9 03/03/2021   MCV 89.5 03/03/2021   PLT 193 03/03/2021   NEUTROABS 3.6 03/03/2021    CMP  Lab Results  Component Value Date   NA 137 02/14/2021   K 3.6 02/14/2021   CL 99 02/14/2021   CO2 30 02/14/2021   GLUCOSE 100 (H) 02/14/2021   BUN 16 02/14/2021   CREATININE 1.11 02/14/2021   CALCIUM 9.4 02/14/2021   PROT 7.1 02/14/2021   ALBUMIN 4.5 02/14/2021   AST 15 02/14/2021   ALT 17 02/14/2021   ALKPHOS 59 02/14/2021   BILITOT 0.8 02/14/2021   GFRNONAA 49 (L) 08/19/2020   GFRAA 57 (L) 08/19/2020    No results found for: CEA1  Lab Results  Component Value Date   INR 2.7 02/05/2021    Imaging:  No results found.  Medications: I have reviewed the patient's current medications.   Assessment/Plan: Multiple gastric carcinoid tumors Upper endoscopy 05/05/2019 with resection of multiple gastric carcinoid tumors EUS 07/12/2019-resection of multiple gastric carcinoid tumors, positive resection margin at the gastric cardia polypectomy site, tubular adenomas and hyperplastic polyps removed  from the lesser curvature Elevated chromogranin A level CT abdomen/pelvis 05/18/2019-no evidence of a gastric mass or metastatic disease Netspot 08/11/2019-focal activity corresponding to a 2-3 mm lesion in the mid pancreas, diffuse activity in the stomach EGD 10/09/2019- 3 areas of scar/clip/polypoid tissue removed with negative pathology EUS 10/09/2019-4 mm pancreas body lesion, FNA biopsy negative, no adenopathy EUS 06/19/2020-no evidence of recurrent tumor in areas of gastric body scars, nodularity was noted underneath 2 endoclips in the greater curvature, this area was biopsied and polyps were removed from the middle gastric body and proximal gastric antrum, biopsies from erosions and erythema were taken at the greater curvature, lesser curvature, and antrum.  An irregular lesion was identified in the pancreas body measuring 3.4 x 4.6 mm, the pancreas lesion was biopsied.  The pathology from the pancreas FNA was negative, the pathology from multiple biopsies including the gastric body scar, antrum polyp, greater curvature biopsy, lesser curvature biopsy, and gastric cardia biopsy revealed low-grade neuroendocrine tumor History of iron deficiency anemia secondary bleeding from #1 and maintained on anticoagulation therapy Hypercoagulation syndrome, antiphospholipid syndrome-maintained on Coumadin Diabetes Abdominal pain-potentially related to #1 6.   Hypothyroidism 7.  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  Family history of uterine and breast cancer 9.  Gastric and colonic adenomas 10.  Hypodense pancreas lesion 05/18/2019 -positive Netspot uptake 08/11/2019, negative FNA biopsy     Disposition: [name] appears stable.  There is no  clinical evidence for progression of the carcinoid tumor.  The chromogranin a level has not changed significantly over the past year.  She plans to follow-up with Dr. Rush Landmark for a repeat upper endoscopy/EUS within the next few months.  She will return for  a chromogranin a level and restaging Netspot in 4 months.  Betsy Coder, MD  03/10/2021  11:48 AM

## 2021-03-11 ENCOUNTER — Other Ambulatory Visit: Payer: Self-pay | Admitting: Family Medicine

## 2021-03-19 ENCOUNTER — Ambulatory Visit (INDEPENDENT_AMBULATORY_CARE_PROVIDER_SITE_OTHER): Payer: Medicare Other | Admitting: General Practice

## 2021-03-19 ENCOUNTER — Other Ambulatory Visit: Payer: Self-pay

## 2021-03-19 DIAGNOSIS — Z7901 Long term (current) use of anticoagulants: Secondary | ICD-10-CM

## 2021-03-19 DIAGNOSIS — D6859 Other primary thrombophilia: Secondary | ICD-10-CM

## 2021-03-19 LAB — POCT INR: INR: 2.7 (ref 2.0–3.0)

## 2021-03-19 NOTE — Patient Instructions (Signed)
Pre visit review using our clinic review tool, if applicable. No additional management support is needed unless otherwise documented below in the visit note. 

## 2021-03-19 NOTE — Progress Notes (Signed)
I have reviewed and agree with note, evaluation, plan.   Erin Penaloza, MD  

## 2021-04-01 ENCOUNTER — Other Ambulatory Visit: Payer: Self-pay | Admitting: Family Medicine

## 2021-04-01 NOTE — Telephone Encounter (Signed)
Pharmacy comment: This flagged a possible interaction with diltiazem.  Is it ok to fill?

## 2021-04-03 ENCOUNTER — Telehealth: Payer: Self-pay | Admitting: *Deleted

## 2021-04-03 NOTE — Telephone Encounter (Signed)
NETSPOT scheduled for 7/25 and MD preferred to wait till 10/25 for repeat scan. Called patient and left VM noting MD preference and for her to call to discuss timing of the scan.

## 2021-04-03 NOTE — Telephone Encounter (Signed)
Patient returned call and scheduled it early thinking Dr. Rush Landmark wanted it done before her endoscopy/colonoscopy. Expresses concern due to needing to work around her vacation week of 8/14 and keeping grandchildren 11/11-20th. Staff message to Dr. Donneta Romberg nurse requesting they reach out to patient to discuss.

## 2021-04-04 ENCOUNTER — Telehealth: Payer: Self-pay

## 2021-04-04 NOTE — Telephone Encounter (Signed)
Left message for pt about upcoming EGD/colonoscopy and PET scan

## 2021-04-04 NOTE — Telephone Encounter (Signed)
plan for repeat EGD/Colonoscopy in 05/2021.  Ok to porceed with PET scan on 04/14/2021

## 2021-04-09 ENCOUNTER — Telehealth: Payer: Self-pay | Admitting: Gastroenterology

## 2021-04-09 DIAGNOSIS — Z86010 Personal history of colonic polyps: Secondary | ICD-10-CM

## 2021-04-09 DIAGNOSIS — C7A8 Other malignant neuroendocrine tumors: Secondary | ICD-10-CM

## 2021-04-09 DIAGNOSIS — Z7901 Long term (current) use of anticoagulants: Secondary | ICD-10-CM

## 2021-04-09 DIAGNOSIS — K31A0 Gastric intestinal metaplasia, unspecified: Secondary | ICD-10-CM

## 2021-04-09 DIAGNOSIS — K317 Polyp of stomach and duodenum: Secondary | ICD-10-CM

## 2021-04-09 DIAGNOSIS — D131 Benign neoplasm of stomach: Secondary | ICD-10-CM

## 2021-04-09 DIAGNOSIS — K869 Disease of pancreas, unspecified: Secondary | ICD-10-CM

## 2021-04-09 NOTE — Telephone Encounter (Signed)
Inbound call from pt requesting a call back stating she has some questions and wanted to inform Dr. Rush Landmark on some different things. Pt requested to speak with Rovanda. Please advise. Thanks.

## 2021-04-10 ENCOUNTER — Other Ambulatory Visit: Payer: Self-pay

## 2021-04-10 DIAGNOSIS — Z86010 Personal history of colonic polyps: Secondary | ICD-10-CM

## 2021-04-10 DIAGNOSIS — K869 Disease of pancreas, unspecified: Secondary | ICD-10-CM

## 2021-04-10 DIAGNOSIS — Z860101 Personal history of adenomatous and serrated colon polyps: Secondary | ICD-10-CM

## 2021-04-10 DIAGNOSIS — K31A0 Gastric intestinal metaplasia, unspecified: Secondary | ICD-10-CM

## 2021-04-10 DIAGNOSIS — C7A8 Other malignant neuroendocrine tumors: Secondary | ICD-10-CM

## 2021-04-10 DIAGNOSIS — K317 Polyp of stomach and duodenum: Secondary | ICD-10-CM

## 2021-04-10 DIAGNOSIS — D131 Benign neoplasm of stomach: Secondary | ICD-10-CM

## 2021-04-10 NOTE — Telephone Encounter (Signed)
-  Dr. Rush Landmark  Returned patient call- Pt called to get her EGD scheduled for Sept. Pt also wanted to have Colonoscopy done at the same time. Pt has been scheduled for 06/12/21 [facility] @ 7:30am. Clearance has been sent to Dr. Yong Channel asking for clearance to hold Coumadin and Bridging instructions.   Dr. Rush Landmark are you okay with me using Miralax prep or would you prefer Suprep or Plenuv?

## 2021-04-10 NOTE — Telephone Encounter (Signed)
Suprep/Plenvu is OK. Thanks. GM

## 2021-04-14 ENCOUNTER — Other Ambulatory Visit: Payer: Self-pay

## 2021-04-14 ENCOUNTER — Ambulatory Visit (HOSPITAL_COMMUNITY)
Admission: RE | Admit: 2021-04-14 | Discharge: 2021-04-14 | Disposition: A | Discharge status: Home / Self Care | Payer: Medicare Other | Source: Ambulatory Visit | Attending: Oncology | Admitting: Oncology

## 2021-04-14 ENCOUNTER — Telehealth: Payer: Self-pay

## 2021-04-14 DIAGNOSIS — C7A092 Malignant carcinoid tumor of the stomach: Secondary | ICD-10-CM | POA: Diagnosis not present

## 2021-04-14 DIAGNOSIS — I700 Atherosclerosis of aorta: Secondary | ICD-10-CM | POA: Diagnosis not present

## 2021-04-14 DIAGNOSIS — C7A00 Malignant carcinoid tumor of unspecified site: Secondary | ICD-10-CM | POA: Diagnosis not present

## 2021-04-14 MED ORDER — GALLIUM GA 68 DOTATATE IV KIT
4.9400 | PACK | Freq: Once | INTRAVENOUS | Status: AC | PRN
  Administered 2021-04-14: 4.94 via INTRAVENOUS

## 2021-04-14 MED ORDER — PLENVU 140 G PO SOLR: 1.0000 | ORAL | 0 refills | Status: DC

## 2021-04-14 NOTE — Telephone Encounter (Signed)
Yes thanks- but needs a lovenox bridge set up. CIndy with our coumadin clinic is amazing at this and I will forward to her for help.

## 2021-04-14 NOTE — Telephone Encounter (Signed)
Instructions for Plenvu have been sent to patient via mychart and by mail. Rx for Plenvu has been sent to pt's pharmacy. Pt has been informed and knows to call with any questions.

## 2021-04-14 NOTE — Addendum Note (Signed)
Addended byDebbe Mounts on: 04/14/2021 10:18 AM   Modules accepted: Orders

## 2021-04-14 NOTE — Telephone Encounter (Signed)
   Erin Kirk 10-13-1955 [id number]  Dear Dr. Yong Channel  We have scheduled the above named patient for a(n) Colon/EGD procedure. Our records show that (s)he is on anticoagulation therapy.  Please advise as to whether the patient may come off their therapy of Coumadin 5 days prior to their procedure which is scheduled for 06/12/21.  Please route your response to [employer] or fax response to [phone]  Sincerely,  Tashea Othman-CMA/Dr.Mansouraty   [facility]

## 2021-04-19 ENCOUNTER — Other Ambulatory Visit: Payer: Self-pay | Admitting: Family Medicine

## 2021-04-21 NOTE — Telephone Encounter (Signed)
Thank you Dr. Yong Channel.

## 2021-04-24 ENCOUNTER — Telehealth: Payer: Self-pay | Admitting: Gastroenterology

## 2021-04-24 NOTE — Telephone Encounter (Signed)
Thanks for update.  She always gets a bridge. GM

## 2021-04-24 NOTE — Telephone Encounter (Signed)
Per the patient she been contacted by Dr. Ansel Bong and given instructions on Lovenox bridging.

## 2021-04-24 NOTE — Telephone Encounter (Signed)
Returned call to patient. Sample of prep placed upfront at front desk . Pt states that she will come by office and pick up this afternoon.

## 2021-04-24 NOTE — Telephone Encounter (Signed)
FYI:  Dr Mansouraty  

## 2021-04-28 ENCOUNTER — Telehealth: Payer: Self-pay

## 2021-04-28 NOTE — Telephone Encounter (Signed)
-----   Message from Tania Ade, RN sent at 04/24/2021  1:06 PM EDT -----  ----- Message ----- From: Ladell Pier, MD Sent: 04/22/2021   4:39 PM EDT To: [facility]  Please call patient, PET scan reveals resolution of uptake in the stomach and improvement in the uptake involving the pancreas lesion, follow-up as scheduled

## 2021-04-28 NOTE — Telephone Encounter (Signed)
TC to Pt relayed Pet scan results to Pt. Pt verbalized understanding. No further problem or concern noted.

## 2021-04-29 ENCOUNTER — Telehealth: Payer: Self-pay | Admitting: General Practice

## 2021-04-29 ENCOUNTER — Other Ambulatory Visit: Payer: Self-pay | Admitting: General Practice

## 2021-04-29 DIAGNOSIS — Z7901 Long term (current) use of anticoagulants: Secondary | ICD-10-CM

## 2021-04-29 MED ORDER — ENOXAPARIN SODIUM 120 MG/0.8ML IJ SOSY: 120.0000 mg | PREFILLED_SYRINGE | INTRAMUSCULAR | 0 refills | Status: DC

## 2021-04-29 NOTE — Telephone Encounter (Signed)
Instructions for coumadin and Lovenox pre and post procedure on 9/22.  9/17 - Last dose of coumadin until after procedure 9/18 - Nothing (No coumadin or Lovenox) 9/19 - Lovenox X 1 in the AM 9/20 - Lovenox X 1 in the AM 9/21 - Lovenox X 1 in the AM (Please take by 7am) 9/22 - Procedure (Do not take Lovenox today!) 9/23 - Lovenox X 1 in the AM AND 1 1/2 tablets of coumadin 9/24 - Lovenox X 1 in the AM AND 1 1/2 tablets of coumadin 9/25 - Lovenox X 1 in the AM AND 1 1/2 tablets of coumadin 9/26 - Lovenox X 1 in the AM AND 1 1/2 tablets of coumadin 9/27 - Lovenox X 1 in the AM AND take 1 tablet of coumadin 9/28 - Re-check INR

## 2021-04-30 ENCOUNTER — Other Ambulatory Visit: Payer: Self-pay

## 2021-04-30 ENCOUNTER — Ambulatory Visit (INDEPENDENT_AMBULATORY_CARE_PROVIDER_SITE_OTHER): Payer: Medicare Other

## 2021-04-30 DIAGNOSIS — Z7901 Long term (current) use of anticoagulants: Secondary | ICD-10-CM | POA: Diagnosis not present

## 2021-04-30 LAB — POCT INR: INR: 3.1 — AB (ref 2.0–3.0)

## 2021-04-30 NOTE — Progress Notes (Signed)
I have reviewed and agree with note, evaluation, plan.   Luetta Piazza, MD  

## 2021-04-30 NOTE — Patient Instructions (Signed)
Pre visit review using our clinic review tool, if applicable. No additional management support is needed unless otherwise documented below in the visit note.  9/17 - Last dose of coumadin until after procedure 9/18 - Nothing (No coumadin or Lovenox) 9/19 - Lovenox X 1 in the AM 9/20 - Lovenox X 1 in the AM 9/21 - Lovenox X 1 in the AM (Please take by 7am) 9/22 - Procedure (Do not take Lovenox today!) 9/23 - Lovenox X 1 in the AM AND 1 1/2 tablets of coumadin 9/24 - Lovenox X 1 in the AM AND 1 1/2 tablets of coumadin 9/25 - Lovenox X 1 in the AM AND 1 1/2 tablets of coumadin 9/26 - Lovenox X 1 in the AM AND 1 1/2 tablets of coumadin 9/27 - Lovenox X 1 in the AM AND take 1 tablet of coumadin 9/28 - Re-check INR

## 2021-05-19 ENCOUNTER — Ambulatory Visit: Payer: Medicare Other | Admitting: Podiatry

## 2021-05-28 ENCOUNTER — Other Ambulatory Visit: Payer: Self-pay | Admitting: Family Medicine

## 2021-06-10 ENCOUNTER — Other Ambulatory Visit: Payer: Self-pay

## 2021-06-10 ENCOUNTER — Encounter (HOSPITAL_COMMUNITY): Payer: Self-pay | Admitting: Gastroenterology

## 2021-06-10 NOTE — Progress Notes (Addendum)
Mrs. Lockyer denies chest pain or shortness of breath. Patient denies having any s/s of Covid in her household.  Patient denies any known exposure to Covid.  Mrs Philipp has not had palpations in years.  Mrs. Friedhoff has diabetes, does not take medications or check CBG.  Mrs Therese Sarah has no questions regarding prep.    I instructed patient to shower with antibiotic soap, if it is available.  Dry off with a clean towel. Do not put lotion, powder, cologne or deodorant or makeup.No jewelry or piercings. Men may shave their face and neck. Woman should not shave. No nail polish, artificial or acrylic nails. Wear clean clothes, brush your teeth. Glasses, contact lens,dentures or partials may not be worn in the OR. If you need to wear them, please bring a case for glasses, do not wear contacts or bring a case, the hospital does not have contact cases, dentures or partials will have to be removed , make sure they are clean, we will provide a denture cup to put them in. You will need some one to drive you home and a responsible person over the age of 22 to stay with you for the first 24 hours after surgery.

## 2021-06-12 ENCOUNTER — Ambulatory Visit (HOSPITAL_COMMUNITY)
Admission: RE | Admit: 2021-06-12 | Discharge: 2021-06-12 | Disposition: A | Discharge status: Home / Self Care | Payer: Medicare Other | Attending: Gastroenterology | Admitting: Gastroenterology

## 2021-06-12 ENCOUNTER — Encounter (HOSPITAL_COMMUNITY): Payer: Self-pay | Admitting: Gastroenterology

## 2021-06-12 ENCOUNTER — Encounter (HOSPITAL_COMMUNITY)
Admission: RE | Disposition: A | Discharge status: Home / Self Care | Payer: Self-pay | Source: Home / Self Care | Attending: Gastroenterology

## 2021-06-12 ENCOUNTER — Other Ambulatory Visit: Payer: Self-pay

## 2021-06-12 ENCOUNTER — Ambulatory Visit (HOSPITAL_COMMUNITY): Payer: Medicare Other | Admitting: Certified Registered Nurse Anesthetist

## 2021-06-12 DIAGNOSIS — K31A29 Gastric intestinal metaplasia with dysplasia, unspecified: Secondary | ICD-10-CM | POA: Diagnosis not present

## 2021-06-12 DIAGNOSIS — D3A8 Other benign neuroendocrine tumors: Secondary | ICD-10-CM | POA: Insufficient documentation

## 2021-06-12 DIAGNOSIS — K319 Disease of stomach and duodenum, unspecified: Secondary | ICD-10-CM | POA: Diagnosis not present

## 2021-06-12 DIAGNOSIS — K869 Disease of pancreas, unspecified: Secondary | ICD-10-CM

## 2021-06-12 DIAGNOSIS — Z86010 Personal history of colonic polyps: Secondary | ICD-10-CM | POA: Diagnosis not present

## 2021-06-12 DIAGNOSIS — D126 Benign neoplasm of colon, unspecified: Secondary | ICD-10-CM | POA: Diagnosis not present

## 2021-06-12 DIAGNOSIS — K219 Gastro-esophageal reflux disease without esophagitis: Secondary | ICD-10-CM | POA: Insufficient documentation

## 2021-06-12 DIAGNOSIS — Z7901 Long term (current) use of anticoagulants: Secondary | ICD-10-CM

## 2021-06-12 DIAGNOSIS — K635 Polyp of colon: Secondary | ICD-10-CM | POA: Diagnosis not present

## 2021-06-12 DIAGNOSIS — D509 Iron deficiency anemia, unspecified: Secondary | ICD-10-CM | POA: Insufficient documentation

## 2021-06-12 DIAGNOSIS — K31A19 Gastric intestinal metaplasia without dysplasia, unspecified site: Secondary | ICD-10-CM | POA: Diagnosis not present

## 2021-06-12 DIAGNOSIS — K317 Polyp of stomach and duodenum: Secondary | ICD-10-CM | POA: Insufficient documentation

## 2021-06-12 DIAGNOSIS — C7A8 Other malignant neuroendocrine tumors: Secondary | ICD-10-CM

## 2021-06-12 DIAGNOSIS — K641 Second degree hemorrhoids: Secondary | ICD-10-CM | POA: Diagnosis not present

## 2021-06-12 DIAGNOSIS — K449 Diaphragmatic hernia without obstruction or gangrene: Secondary | ICD-10-CM | POA: Insufficient documentation

## 2021-06-12 DIAGNOSIS — K31A0 Gastric intestinal metaplasia, unspecified: Secondary | ICD-10-CM

## 2021-06-12 DIAGNOSIS — Z85028 Personal history of other malignant neoplasm of stomach: Secondary | ICD-10-CM | POA: Diagnosis not present

## 2021-06-12 DIAGNOSIS — K3189 Other diseases of stomach and duodenum: Secondary | ICD-10-CM | POA: Diagnosis not present

## 2021-06-12 DIAGNOSIS — D131 Benign neoplasm of stomach: Secondary | ICD-10-CM

## 2021-06-12 HISTORY — PX: POLYPECTOMY: SHX5525

## 2021-06-12 HISTORY — DX: Plantar fascial fibromatosis: M72.2

## 2021-06-12 HISTORY — DX: Headache, unspecified: R51.9

## 2021-06-12 HISTORY — PX: BIOPSY: SHX5522

## 2021-06-12 HISTORY — DX: Acute embolism and thrombosis of unspecified deep veins of unspecified lower extremity: I82.409

## 2021-06-12 HISTORY — PX: ESOPHAGOGASTRODUODENOSCOPY (EGD) WITH PROPOFOL: SHX5813

## 2021-06-12 HISTORY — PX: SUBMUCOSAL LIFTING INJECTION: SHX6855

## 2021-06-12 HISTORY — PX: COLONOSCOPY WITH PROPOFOL: SHX5780

## 2021-06-12 HISTORY — DX: Chronic kidney disease, unspecified: N18.9

## 2021-06-12 HISTORY — PX: HEMOSTASIS CLIP PLACEMENT: SHX6857

## 2021-06-12 SURGERY — COLONOSCOPY WITH PROPOFOL
Anesthesia: Monitor Anesthesia Care

## 2021-06-12 MED ORDER — PROPOFOL 500 MG/50ML IV EMUL
INTRAVENOUS | Status: DC | PRN
  Administered 2021-06-12: 125 ug/kg/min via INTRAVENOUS
  Administered 2021-06-12: 100 ug/kg/min via INTRAVENOUS

## 2021-06-12 MED ORDER — PROPOFOL 10 MG/ML IV BOLUS
INTRAVENOUS | Status: DC | PRN
Start: 2021-06-12 — End: 2021-06-12
  Administered 2021-06-12 (×3): 30 mg via INTRAVENOUS

## 2021-06-12 MED ORDER — LACTATED RINGERS IV SOLN: INTRAVENOUS | Status: DC | PRN

## 2021-06-12 MED ORDER — EPHEDRINE SULFATE-NACL 50-0.9 MG/10ML-% IV SOSY
PREFILLED_SYRINGE | INTRAVENOUS | Status: DC | PRN
  Administered 2021-06-12: 5 mg via INTRAVENOUS

## 2021-06-12 MED ORDER — OMEPRAZOLE 40 MG PO CPDR: 40.0000 mg | DELAYED_RELEASE_CAPSULE | Freq: Two times a day (BID) | ORAL | 5 refills | Status: DC

## 2021-06-12 MED ORDER — LIDOCAINE 2% (20 MG/ML) 5 ML SYRINGE
INTRAMUSCULAR | Status: DC | PRN
  Administered 2021-06-12: 40 mg via INTRAVENOUS

## 2021-06-12 MED ORDER — WARFARIN SODIUM 5 MG PO TABS: ORAL_TABLET | ORAL | 0 refills | Status: DC

## 2021-06-12 MED ORDER — ENOXAPARIN SODIUM 120 MG/0.8ML IJ SOSY: 120.0000 mg | PREFILLED_SYRINGE | INTRAMUSCULAR | 0 refills | Status: DC

## 2021-06-12 MED ORDER — PHENYLEPHRINE 40 MCG/ML (10ML) SYRINGE FOR IV PUSH (FOR BLOOD PRESSURE SUPPORT)
PREFILLED_SYRINGE | INTRAVENOUS | Status: DC | PRN
  Administered 2021-06-12: 80 ug via INTRAVENOUS

## 2021-06-12 MED ORDER — SODIUM CHLORIDE 0.9 % IV SOLN: INTRAVENOUS | Status: DC

## 2021-06-12 SURGICAL SUPPLY — 25 items

## 2021-06-12 NOTE — Op Note (Signed)
[facility] Patient Name: Erin Kirk Procedure Date : 06/12/2021 MRN: [id number] Attending MD: Justice Britain , MD Date of Birth: 1955-10-25 CSN: [id number] Age: 65 Admit Type: Inpatient Procedure:                Upper GI endoscopy Indications:              Iron deficiency anemia, Neuroendocrine Tumor Providers:                Justice Britain, MD, Janee Morn,                            Technician, Jeanella Cara, RN Referring MD:             Gatha Mayer, MD, Izola Price. Rosanne Ashing                            Hunter MD Medicines:                Monitored Anesthesia Care Complications:            No immediate complications. Estimated Blood Loss:     Estimated blood loss was minimal. Procedure:                Pre-Anesthesia Assessment:                           - Prior to the procedure, a History and Physical                            was performed, and patient medications and                            allergies were reviewed. The patient's tolerance of                            previous anesthesia was also reviewed. The risks                            and benefits of the procedure and the sedation                            options and risks were discussed with the patient.                            All questions were answered, and informed consent                            was obtained. Prior Anticoagulants: The patient has                            taken Coumadin (warfarin), last dose was 5 days                            prior to procedure. ASA Grade Assessment: III - A  patient with severe systemic disease. After                            reviewing the risks and benefits, the patient was                            deemed in satisfactory condition to undergo the                            procedure.                           After obtaining informed consent, the endoscope was                             passed under direct vision. Throughout the                            procedure, the patient's blood pressure, pulse, and                            oxygen saturations were monitored continuously. The                            [id number]-[id number] ([id number]) Olympus endoscope was                            introduced through the mouth, and advanced to the                            second part of duodenum. The upper GI endoscopy was                            accomplished without difficulty. The patient                            tolerated the procedure. Scope In: Scope Out: Findings:      No gross lesions were noted in the entire esophagus.      The Z-line was irregular and was found 35 cm from the incisors.      A 2 cm hiatal hernia was present.      Five 5 to 8 mm sessile polyps with no bleeding and one that had stigmata       of recent bleeding were found on the greater curvature of the stomach.       Preparations were made for mucosal resections. NBI imaging and       White-light endoscopy was done to demarcate the borders of the lesions.       Saline was injected to raise each lesion. Snare mucosal resection was       performed. Resection and retrieval were complete. To prevent bleeding       after mucosal resection, five hemostatic clips were successfully placed       (MR conditional) on each of the resection sites. There was no bleeding       at the end of the procedure.  Patchy mildly erythematous mucosa was found in the entire examined       stomach and due to history of previous intestinal metaplasia and       neuroendocrine dysplasia, gastric mapping biopsies were performed.       Biopsies were taken with a cold forceps for histology from the antrum.       Biopsies were taken with a cold forceps for histology from the incisura.       Biopsies were taken with a cold forceps for histology from the greater       curve. Biopsies were taken with a cold forceps for histology from the        lesser curve. Biopsies were taken with a cold forceps for histology from       the cardia/fundus.      No gross lesions were noted in the duodenal bulb, in the first portion       of the duodenum and in the second portion of the duodenum. Impression:               - No gross lesions in esophagus. Z-line irregular,                            35 cm from the incisors.                           - 2 cm hiatal hernia.                           - Five gastric polyps. Resected and retrieved.                            Clips (MR conditional) were placed on each                            resection.                           - Erythematous mucosa in the stomach. Biopsied.                           - No gross lesions in the duodenal bulb, in the                            first portion of the duodenum and in the second                            portion of the duodenum. Recommendation:           - Proceed to scheduled colonoscopy.                           - Coumadin restart as per Colonoscopy report.                           - Observe patient's clinical course.                           - Observe patient's clinical  course.                           - Await pathology results.                           - Continue PPI BID.                           - The findings and recommendations were discussed                            with the patient.                           - The findings and recommendations were discussed                            with the patient's family. Procedure Code(s):        --- Professional ---                           [phone], Esophagogastroduodenoscopy, flexible,                            transoral; with endoscopic mucosal resection Diagnosis Code(s):        --- Professional ---                           K22.8, Other specified diseases of esophagus                           K44.9, Diaphragmatic hernia without obstruction or                            gangrene                            K31.7, Polyp of stomach and duodenum                           K31.89, Other diseases of stomach and duodenum                           D50.9, Iron deficiency anemia, unspecified CPT copyright 2019 American Medical Association. All rights reserved. The codes documented in this report are preliminary and upon coder review may  be revised to meet current compliance requirements. Justice Britain, MD 06/12/2021 9:05:43 AM Number of Addenda: 0

## 2021-06-12 NOTE — Anesthesia Postprocedure Evaluation (Signed)
Anesthesia Post Note  Patient: Erin Kirk  Procedure(s) Performed: COLONOSCOPY WITH PROPOFOL ESOPHAGOGASTRODUODENOSCOPY (EGD) WITH PROPOFOL SUBMUCOSAL LIFTING INJECTION POLYPECTOMY HEMOSTASIS CLIP PLACEMENT BIOPSY     Patient location during evaluation: PACU Anesthesia Type: MAC Level of consciousness: awake and alert Pain management: pain level controlled Vital Signs Assessment: post-procedure vital signs reviewed and stable Respiratory status: spontaneous breathing, nonlabored ventilation, respiratory function stable and patient connected to nasal cannula oxygen Cardiovascular status: stable and blood pressure returned to baseline Postop Assessment: no apparent nausea or vomiting Anesthetic complications: no   No notable events documented.  Last Vitals:  Vitals:   06/12/21 0927 06/12/21 0930  BP: 129/75   Pulse: 66 70  Resp: 14 13  Temp:  36.7 C  SpO2: 99% 98%    Last Pain:  Vitals:   06/12/21 0930  TempSrc:   PainSc: 0-No pain                 Tiajuana Amass

## 2021-06-12 NOTE — H&P (Signed)
GASTROENTEROLOGY PROCEDURE H&P NOTE   Primary Care Physician: Marin Olp, MD  HPI: Erin Kirk is a 65 y.o. female who presents for EGD/Colonoscopy for follow up Gastric NET and as well IDA in past.  Past Medical History:  Diagnosis Date   Allergy    Anxiety    Arthritis    Asthma    many years ago   Blood transfusion without reported diagnosis    Cataract    Chronic kidney disease    Clotting disorder ([place])    Depression    Husband died 07/07/19   Diabetes ([place])    not on medication   DVT (deep venous thrombosis) ([place])    left arm   Dysrhythmia    Family history of bone cancer    Family history of breast cancer    Family history of lung cancer    Family history of ovarian cancer    Family history of testicular cancer    Family history of uterine cancer    GERD (gastroesophageal reflux disease)    Headache    Hypertension    Hypothyroidism    Iron deficiency anemia 03/30/2019   Irregular heart beat    tachycardia   Lupus anticoagulant with hypercoagulable state ([place])    Melanoma ([place])    2005- face    Melanoma ([place])    face   Plantar fasciitis    Pneumonia 2019   Primary hypercoagulable state ([place])    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   Pulmonary embolism ([place])    Thyroid disease    hypothyroidism   Past Surgical History:  Procedure Laterality Date   ABDOMINAL HYSTERECTOMY     noncancerous   BACK SURGERY  1983   BIOPSY  06/19/2020   Procedure: BIOPSY;  Surgeon: Irving Copas., MD;  Location: [facility];  Service: Gastroenterology;;   CHOLECYSTECTOMY     COLONOSCOPY     ENDOSCOPIC MUCOSAL RESECTION N/A 07/12/2019   Procedure: ENDOSCOPIC MUCOSAL RESECTION;  Surgeon: Irving Copas., MD;  Location: [facility] END[facility]COPY;  Service: Gastroenterology;  Laterality: N/A;   ENDOSCOPIC MUCOSAL RESECTION  06/19/2020   Procedure: ENDOSCOPIC MUCOSAL RESECTION;  Surgeon: Rush Landmark Telford Nab.,  MD;  Location: [facility];  Service: Gastroenterology;;   ESOPHAGOGASTRODUODENOSCOPY N/A 06/19/2020   Procedure: ESOPHAGOGASTRODUODENOSCOPY (EGD);  Surgeon: Irving Copas., MD;  Location: [facility];  Service: Gastroenterology;  Laterality: N/A;   ESOPHAGOGASTRODUODENOSCOPY (EGD) WITH PROPOFOL N/A 07/12/2019   Procedure: ESOPHAGOGASTRODUODENOSCOPY (EGD) WITH PROPOFOL;  Surgeon: Rush Landmark Telford Nab., MD;  Location: [place];  Service: Gastroenterology;  Laterality: N/A;   ESOPHAGOGASTRODUODENOSCOPY (EGD) WITH PROPOFOL N/A 10/09/2019   Procedure: ESOPHAGOGASTRODUODENOSCOPY (EGD) WITH PROPOFOL;  Surgeon: Rush Landmark Telford Nab., MD;  Location: [place];  Service: Gastroenterology;  Laterality: N/A;   EUS N/A 07/12/2019   Procedure: UPPER ENDOSCOPIC ULTRASOUND (EUS) RADIAL;  Surgeon: Irving Copas., MD;  Location: [place];  Service: Gastroenterology;  Laterality: N/A;   EUS N/A 10/09/2019   Procedure: UPPER ENDOSCOPIC ULTRASOUND (EUS) RADIAL;  Surgeon: Irving Copas., MD;  Location: [place];  Service: Gastroenterology;  Laterality: N/A;   fatty tumor breast Right    removed   FINE NEEDLE ASPIRATION  06/19/2020   Procedure: FINE NEEDLE ASPIRATION (FNA) LINEAR;  Surgeon: Irving Copas., MD;  Location: [facility];  Service: Gastroenterology;;   FOREIGN BODY REMOVAL  10/09/2019   Procedure: FOREIGN BODY REMOVAL;  Surgeon: Irving Copas., MD;  Location: [place];  Service: Gastroenterology;;  FOREIGN BODY REMOVAL  06/19/2020   Procedure: FOREIGN BODY REMOVAL;  Surgeon: Rush Landmark Telford Nab., MD;  Location: [facility];  Service: Gastroenterology;;  polyp removal with net    HEMOSTASIS CLIP PLACEMENT  07/12/2019   Procedure: HEMOSTASIS CLIP PLACEMENT;  Surgeon: Irving Copas., MD;  Location: [place];  Service: Gastroenterology;;   HEMOSTASIS CLIP PLACEMENT  10/09/2019   Procedure: HEMOSTASIS CLIP PLACEMENT;  Surgeon:  Irving Copas., MD;  Location: [place];  Service: Gastroenterology;;   HEMOSTASIS CLIP PLACEMENT  06/19/2020   Procedure: HEMOSTASIS CLIP PLACEMENT;  Surgeon: Irving Copas., MD;  Location: [facility];  Service: Gastroenterology;;   HEMOSTASIS CONTROL  07/12/2019   Procedure: HEMOSTASIS CONTROL;  Surgeon: Irving Copas., MD;  Location: [place];  Service: Gastroenterology;;   HYSTEROTOMY     KNEE ARTHROSCOPY Left    Lymp nodes Right 08/2004   MELANOMA EXCISION Right 2005   face    OOPHORECTOMY Bilateral    POLYPECTOMY  07/12/2019   Procedure: POLYPECTOMY;  Surgeon: Irving Copas., MD;  Location: [place];  Service: Gastroenterology;;   POLYPECTOMY  10/09/2019   Procedure: POLYPECTOMY;  Surgeon: Irving Copas., MD;  Location: [place];  Service: Gastroenterology;;   POLYPECTOMY  06/19/2020   Procedure: POLYPECTOMY;  Surgeon: Irving Copas., MD;  Location: [facility];  Service: Gastroenterology;;   removal lymphnodes in right neck     SUBMUCOSAL LIFTING INJECTION  07/12/2019   Procedure: SUBMUCOSAL LIFTING INJECTION;  Surgeon: Irving Copas., MD;  Location: [place];  Service: Gastroenterology;;   SUBMUCOSAL LIFTING INJECTION  10/09/2019   Procedure: SUBMUCOSAL LIFTING INJECTION;  Surgeon: Irving Copas., MD;  Location: [place];  Service: Gastroenterology;;   UPPER ESOPHAGEAL ENDOSCOPIC ULTRASOUND (EUS) N/A 06/19/2020   Procedure: UPPER ESOPHAGEAL ENDOSCOPIC ULTRASOUND (EUS);  Surgeon: Irving Copas., MD;  Location: [facility];  Service: Gastroenterology;  Laterality: N/A;   UPPER GASTROINTESTINAL ENDOSCOPY     Current Facility-Administered Medications  Medication Dose Route Frequency Provider Last Rate Last Admin   0.9 %  sodium chloride infusion   Intravenous Continuous Mansouraty, Telford Nab., MD        Current Facility-Administered Medications:    0.9 %  sodium chloride  infusion, , Intravenous, Continuous, Mansouraty, Telford Nab., MD Allergies  Allergen Reactions   Guaifenesin & Derivatives Nausea And Vomiting    Hallucinations   Iohexol Anaphylaxis, Hives and Shortness Of Breath    Incident 1990, premedicated prior to obtaining IV contrast since.   Amoxicillin-Pot Clavulanate Swelling and Other (See Comments)    Facial swelling Did it involve swelling of the face/tongue/throat, SOB, or low BP? Yes Did it involve sudden or severe rash/hives, skin peeling, or any reaction on the inside of your mouth or nose? No Did you need to seek medical attention at a hospital or doctor's office? No When did it last happen?~7-8 years ago   If all above answers are "NO", may proceed with cephalosporin use.    Other     Contrast dye   Family History  Problem Relation Age of Onset   Coronary artery disease Mother        Mom 22, Dad 57-smoker   Diabetes Mother    Ovarian cancer Mother 39       not officially diagnosed   Hypertension Father    Diabetes Father    Mitral valve prolapse Sister    Diabetes Sister    Breast cancer Maternal Aunt 84   Breast cancer Half-Sister 39  Breast cancer Half-Sister 72   Uterine cancer Niece 27   Breast cancer Niece        diagnosed mid-60s   Lung cancer Nephew    Bone cancer Nephew 3   Cancer Niece 29       unknown type   Alzheimer's disease Maternal Aunt    Breast cancer Other        maternal great-aunt, diagnosed in her 90s   Breast cancer Other        4th degree maternal relative, diagnosed in her 21s   Colon cancer Neg Hx    Esophageal cancer Neg Hx    Pancreatic cancer Neg Hx    Rectal cancer Neg Hx    Stomach cancer Neg Hx    Inflammatory bowel disease Neg Hx    Liver disease Neg Hx    Social History   Socioeconomic History   Marital status: Widowed    Spouse name: Not on file   Number of children: Not on file   Years of education: Not on file   Highest education level: Not on file  Occupational  History   Not on file  Tobacco Use   Smoking status: Never   Smokeless tobacco: Never  Vaping Use   Vaping Use: Never used  Substance and Sexual Activity   Alcohol use: Never    Comment: no    Drug use: Never   Sexual activity: Yes  Other Topics Concern   Not on file  Social History Narrative   Married 1971. 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    Social Determinants of Health   Financial Resource Strain: Not on file  Food Insecurity: Not on file  Transportation Needs: Not on file  Physical Activity: Not on file  Stress: Not on file  Social Connections: Not on file  Intimate Partner Violence: Not on file    Physical Exam: Today's Vitals   06/12/21 0647  BP: 125/69  Pulse: 76  Resp: 18  Temp: 97.9 F (36.6 C)  TempSrc: Temporal  SpO2: 98%  Weight: 93 kg  Height: 5\' 4"  (1.626 m)  PainSc: 0-No pain   Body mass index is 35.19 kg/m. GEN: NAD EYE: Sclerae anicteric ENT: MMM CV: Non-tachycardic GI: Soft, NT/ND NEURO:  Alert & Oriented x 3  Lab Results: No results for input(s): WBC, HGB, HCT, PLT in the last 72 hours. BMET No results for input(s): NA, K, CL, CO2, GLUCOSE, BUN, CREATININE, CALCIUM in the last 72 hours. LFT No results for input(s): PROT, ALBUMIN, AST, ALT, ALKPHOS, BILITOT, BILIDIR, IBILI in the last 72 hours. PT/INR No results for input(s): LABPROT, INR in the last 72 hours.   Impression / Plan: This is a 65 y.o.female who presents for EGD/Colonoscopy for follow up Gastric NET and as well IDA in past.  The risks and benefits of endoscopic evaluation/treatment were discussed with the patient and/or family; these include but are not limited to the risk of perforation, infection, bleeding, missed lesions, lack of diagnosis, severe illness requiring  hospitalization, as well as anesthesia and sedation related illnesses.  The patient's history has been reviewed, patient examined, no change in status, and deemed stable for procedure.  The patient and/or family is agreeable to proceed.    [employer],  MD [facility] Advanced Endoscopy Office # [phone]

## 2021-06-12 NOTE — Transfer of Care (Signed)
Immediate Anesthesia Transfer of Care Note  Patient: AHMAYA OSTERMILLER  Procedure(s) Performed: COLONOSCOPY WITH PROPOFOL ESOPHAGOGASTRODUODENOSCOPY (EGD) WITH PROPOFOL SUBMUCOSAL LIFTING INJECTION POLYPECTOMY HEMOSTASIS CLIP PLACEMENT BIOPSY  Patient Location: PACU  Anesthesia Type:MAC  Level of Consciousness: awake and alert   Airway & Oxygen Therapy: Patient Spontanous Breathing  Post-op Assessment: Report given to RN and Post -op Vital signs reviewed and stable  Post vital signs: Reviewed and stable  Last Vitals:  Vitals Value Taken Time  BP 114/68 06/12/21 0857  Temp    Pulse 72 06/12/21 0858  Resp 16 06/12/21 0858  SpO2 100 % 06/12/21 0858  Vitals shown include unvalidated device data.  Last Pain:  Vitals:   06/12/21 0647  TempSrc: Temporal  PainSc: 0-No pain         Complications: No notable events documented.

## 2021-06-12 NOTE — Anesthesia Preprocedure Evaluation (Signed)
Anesthesia Evaluation  Patient identified by MRN, date of birth, ID band Patient awake    Reviewed: Allergy & Precautions, NPO status , Patient's Chart, lab work & pertinent test results, reviewed documented beta blocker date and time   History of Anesthesia Complications Negative for: history of anesthetic complications  Airway Mallampati: II  TM Distance: >3 FB Neck ROM: Full    Dental  (+) Dental Advisory Given   Pulmonary PE 06/15/2020 SARS coronavirus NEG   breath sounds clear to auscultation       Cardiovascular hypertension, Pt. on medications and Pt. on home beta blockers (-) angina Rhythm:Regular Rate:Normal     Neuro/Psych Anxiety Depression negative neurological ROS     GI/Hepatic Neg liver ROS, GERD  Controlled,Neuroendocrine tumor of stomach   Endo/Other  diabetesHypothyroidism lupus  Renal/GU Renal InsufficiencyRenal disease     Musculoskeletal  (+) Arthritis ,   Abdominal (+) + obese,   Peds  Hematology coumadin   Anesthesia Other Findings   Reproductive/Obstetrics                             Anesthesia Physical  Anesthesia Plan  ASA: 2  Anesthesia Plan: MAC   Post-op Pain Management:    Induction:   PONV Risk Score and Plan: 2 and Treatment may vary due to age or medical condition and Ondansetron  Airway Management Planned: Natural Airway and Nasal Cannula  Additional Equipment: None  Intra-op Plan:   Post-operative Plan:   Informed Consent: I have reviewed the patients History and Physical, chart, labs and discussed the procedure including the risks, benefits and alternatives for the proposed anesthesia with the patient or authorized representative who has indicated his/her understanding and acceptance.     Dental advisory given  Plan Discussed with: CRNA and Surgeon  Anesthesia Plan Comments:         Anesthesia Quick Evaluation

## 2021-06-12 NOTE — Anesthesia Procedure Notes (Signed)
Procedure Name: MAC Date/Time: 06/12/2021 7:38 AM Performed by: Reece Agar, CRNA Pre-anesthesia Checklist: Patient identified, Emergency Drugs available, Suction available and Patient being monitored Patient Re-evaluated:Patient Re-evaluated prior to induction Oxygen Delivery Method: Nasal cannula

## 2021-06-12 NOTE — Op Note (Signed)
Erin Kirk Patient Name: Erin Kirk Procedure Date : 06/12/2021 MRN: [id number] Attending MD: Justice Britain , MD Date of Birth: [birth year] CSN: [id number] Age: 65 Admit Type: Inpatient Procedure:                Colonoscopy Indications:              Surveillance: Personal history of adenomatous                            polyps on last colonoscopy 3 years ago, Incidental                            - Iron deficiency anemia Providers:                Justice Britain, MD, Jeanella Cara, RN,                            Fransico Setters Mbumina, Technician Referring MD:             Gatha Mayer, MD, Izola Price. Rosanne Ashing                            Hunter MD Medicines:                Monitored Anesthesia Care Complications:            No immediate complications. Estimated Blood Loss:     Estimated blood loss was minimal. Procedure:                Pre-Anesthesia Assessment:                           - Prior to the procedure, a History and Physical                            was performed, and patient medications and                            allergies were reviewed. The patient's tolerance of                            previous anesthesia was also reviewed. The risks                            and benefits of the procedure and the sedation                            options and risks were discussed with the patient.                            All questions were answered, and informed consent                            was obtained. Prior Anticoagulants: The patient has  taken Coumadin (warfarin), last dose was 5 days                            prior to procedure. ASA Grade Assessment: III - A                            patient with severe systemic disease. After                            reviewing the risks and benefits, the patient was                            deemed in satisfactory condition to undergo the                             procedure.                           After obtaining informed consent, the colonoscope                            was passed under direct vision. Throughout the                            procedure, the patient's blood pressure, pulse, and                            oxygen saturations were monitored continuously. The                            PCF-HQ190L ([id number]) Olympus colonoscope was                            introduced through the anus and advanced to the 5                            cm into the ileum. The colonoscopy was performed                            without difficulty. The patient tolerated the                            procedure. The quality of the bowel preparation was                            adequate. The terminal ileum, ileocecal valve,                            appendiceal orifice, and rectum were photographed. Scope In: 8:18:47 AM Scope Out: 8:43:45 AM Scope Withdrawal Time: 0 hours 21 minutes 56 seconds  Total Procedure Duration: 0 hours 24 minutes 58 seconds  Findings:      The digital rectal exam findings include hemorrhoids. Pertinent       negatives include no palpable rectal lesions.  The terminal ileum and ileocecal valve appeared normal.      Three sessile polyps were found in the ascending colon and cecum. The       polyps were 3 to 6 mm in size. These polyps were removed with a cold       snare. Resection and retrieval were complete.      A 15 mm polyp was found in the descending colon. The polyp was       pedunculated. The polyp was removed with a lift and cut technique using       a cold snare. Resection and retrieval were complete. To prevent bleeding       after the polypectomy, two hemostatic clips were successfully placed (MR       conditional). There was no bleeding at the end of the procedure.      Normal mucosa was found in the entire colon otherwise.      Non-bleeding non-thrombosed external and internal hemorrhoids were found        during retroflexion, during perianal exam and during digital exam. The       hemorrhoids were Grade II (internal hemorrhoids that prolapse but reduce       spontaneously). Impression:               - Hemorrhoids found on digital rectal exam.                           - The examined portion of the ileum was normal.                           - Three 3 to 6 mm polyps in the ascending colon and                            in the cecum, removed with a cold snare. Resected                            and retrieved.                           - One 15 mm polyp in the descending colon, removed                            using lift and cut and a cold snare. Resected and                            retrieved. Clips (MR conditional) were placed.                           - Normal mucosa in the entire examined colon                            otherwise.                           - Non-bleeding non-thrombosed external and internal                            hemorrhoids. Recommendation:           -  The patient will be observed post-procedure,                            until all discharge criteria are met.                           - Discharge patient to home.                           - Patient has a contact number available for                            emergencies. The signs and symptoms of potential                            delayed complications were discussed with the                            patient. Return to normal activities tomorrow.                            Written discharge instructions were provided to the                            patient.                           - High fiber diet.                           - Lovenox restart on 9/23. May restart Coumadin at                            dosing on 9/23 as per Coumadin clinic.                           - Await pathology results.                           - Continue present medications.                           - Repeat colonoscopy in 3  - 5 years for                            surveillance based on pathology results.                           - If IDA persists, consider potential role of VCE,                            though with the finding of the a recent stigmata of                            bleeding gastric polyp that likely is cause.  Continue Oral Iron +/- IV Iron as able.                           - The findings and recommendations were discussed                            with the patient.                           - The findings and recommendations were discussed                            with the patient's family. Procedure Code(s):        --- Professional ---                           [phone], Colonoscopy, flexible; with removal of                            tumor(s), polyp(s), or other lesion(s) by snare                            technique Diagnosis Code(s):        --- Professional ---                           K63.5, Polyp of colon                           Z86.010, Personal history of colonic polyps                           K64.1, Second degree hemorrhoids CPT copyright 2019 [employer]. All rights reserved. The codes documented in this report are preliminary and upon coder review may  be revised to meet current compliance requirements. Justice Britain, MD 06/12/2021 9:13:05 AM Number of Addenda: 0

## 2021-06-13 ENCOUNTER — Encounter (HOSPITAL_COMMUNITY): Payer: Self-pay | Admitting: Gastroenterology

## 2021-06-14 ENCOUNTER — Other Ambulatory Visit: Payer: Self-pay | Admitting: Family Medicine

## 2021-06-16 LAB — SURGICAL PATHOLOGY

## 2021-06-17 ENCOUNTER — Encounter: Payer: Self-pay | Admitting: Gastroenterology

## 2021-06-18 ENCOUNTER — Other Ambulatory Visit: Payer: Self-pay

## 2021-06-18 ENCOUNTER — Ambulatory Visit (INDEPENDENT_AMBULATORY_CARE_PROVIDER_SITE_OTHER): Payer: Medicare Other

## 2021-06-18 ENCOUNTER — Ambulatory Visit: Payer: Medicare Other

## 2021-06-18 DIAGNOSIS — Z7901 Long term (current) use of anticoagulants: Secondary | ICD-10-CM

## 2021-06-18 LAB — POCT INR: INR: 2.7 (ref 2.0–3.0)

## 2021-06-18 NOTE — Patient Instructions (Addendum)
Pre visit review using our clinic review tool, if applicable. No additional management support is needed unless otherwise documented below in the visit note.  Continue taking 1 tablet daily except take 1/2 tablet on Mondays. Recheck in 6 wks. 

## 2021-06-18 NOTE — Progress Notes (Signed)
I have reviewed and agree with note, evaluation, plan.   Joee Iovine, MD  

## 2021-06-19 ENCOUNTER — Ambulatory Visit: Payer: Medicare Other | Admitting: Podiatry

## 2021-06-19 ENCOUNTER — Ambulatory Visit (INDEPENDENT_AMBULATORY_CARE_PROVIDER_SITE_OTHER): Payer: Medicare Other

## 2021-06-19 DIAGNOSIS — M7751 Other enthesopathy of right foot: Secondary | ICD-10-CM

## 2021-06-19 DIAGNOSIS — M722 Plantar fascial fibromatosis: Secondary | ICD-10-CM

## 2021-06-19 DIAGNOSIS — M7750 Other enthesopathy of unspecified foot: Secondary | ICD-10-CM

## 2021-06-19 MED ORDER — METHYLPREDNISOLONE 4 MG PO TBPK: ORAL_TABLET | ORAL | 0 refills | Status: DC

## 2021-06-19 NOTE — Patient Instructions (Signed)

## 2021-06-19 NOTE — Progress Notes (Signed)
  Subjective:  Patient ID: Erin Kirk, female    DOB: [birth year],  MRN: [id number]  Chief Complaint  Patient presents with   Foot Pain    Right foot pain    65 y.o. female presents with the above complaint. History confirmed with patient.  She has a history of plantar fasciitis on both sides and was doing quite well until about a month ago when she was at the beach and started hurting.  Most the pain is on the outside of the lateral right foot.  Objective:  Physical Exam: warm, good capillary refill, no trophic changes or ulcerative lesions, normal DP and PT pulses, and normal sensory exam. Left Foot: normal exam, no swelling, tenderness, instability; ligaments intact, full range of motion of all ankle/foot joints Right Foot: Sharp pain on the plantar fascia and calcaneal insertion of the plantar calcaneal tubercle mostly lateral  No images are attached to the encounter.  Radiographs: Multiple views x-ray of the right foot: no fracture, dislocation, swelling or degenerative changes noted and plantar calcaneal spur worse laterally on the oblique view and possible small piece broken off Assessment:   1. Plantar fasciitis of right foot      Plan:  Patient was evaluated and treated and all questions answered.  Discussed the etiology and treatment options for plantar fasciitis including stretching, formal physical therapy, supportive shoegears such as a running shoe or sneaker, pre fabricated orthoses, injection therapy, and oral medications. We also discussed the role of surgical treatment of this for patients who do not improve after exhausting non-surgical treatment options.   -XR reviewed with patient -Educated patient on stretching and icing of the affected limb -Injection delivered to the plantar fascia of the right foot. -Rx for medrol pack. Educated on use, risks, and benefits of the medication  After sterile prep with povidone-iodine solution and alcohol, the right heel  was injected with 0.5cc 2% xylocaine plain, 0.5cc 0.5% marcaine plain, 5mg  triamcinolone acetonide, and 2mg  dexamethasone was injected along the lateral plantar fascia at the insertion on the plantar calcaneus. The patient tolerated the procedure well without complication.  Return in about 1 month (around 07/19/2021) for recheck plantar fasciitis.

## 2021-07-10 ENCOUNTER — Inpatient Hospital Stay: Payer: Medicare Other | Attending: Oncology

## 2021-07-10 ENCOUNTER — Other Ambulatory Visit: Payer: Self-pay

## 2021-07-10 DIAGNOSIS — D3A092 Benign carcinoid tumor of the stomach: Secondary | ICD-10-CM | POA: Insufficient documentation

## 2021-07-10 DIAGNOSIS — E039 Hypothyroidism, unspecified: Secondary | ICD-10-CM | POA: Insufficient documentation

## 2021-07-10 DIAGNOSIS — Z7901 Long term (current) use of anticoagulants: Secondary | ICD-10-CM | POA: Diagnosis not present

## 2021-07-10 DIAGNOSIS — D6859 Other primary thrombophilia: Secondary | ICD-10-CM | POA: Diagnosis not present

## 2021-07-10 DIAGNOSIS — C7A092 Malignant carcinoid tumor of the stomach: Secondary | ICD-10-CM

## 2021-07-10 LAB — CBC WITH DIFFERENTIAL (CANCER CENTER ONLY)
Abs Immature Granulocytes: 0.02 10*3/uL (ref 0.00–0.07)
Basophils Absolute: 0.1 10*3/uL (ref 0.0–0.1)
Basophils Relative: 1 %
Eosinophils Absolute: 0.3 10*3/uL (ref 0.0–0.5)
Eosinophils Relative: 5 %
HCT: 40.5 % (ref 36.0–46.0)
Hemoglobin: 13.8 g/dL (ref 12.0–15.0)
Immature Granulocytes: 0 %
Lymphocytes Relative: 31 %
Lymphs Abs: 2 10*3/uL (ref 0.7–4.0)
MCH: 30.3 pg (ref 26.0–34.0)
MCHC: 34.1 g/dL (ref 30.0–36.0)
MCV: 88.8 fL (ref 80.0–100.0)
Monocytes Absolute: 0.5 10*3/uL (ref 0.1–1.0)
Monocytes Relative: 7 %
Neutro Abs: 3.5 10*3/uL (ref 1.7–7.7)
Neutrophils Relative %: 56 %
Platelet Count: 177 10*3/uL (ref 150–400)
RBC: 4.56 MIL/uL (ref 3.87–5.11)
RDW: 12.1 % (ref 11.5–15.5)
WBC Count: 6.3 10*3/uL (ref 4.0–10.5)
nRBC: 0 % (ref 0.0–0.2)

## 2021-07-11 LAB — CHROMOGRANIN A: Chromogranin A (ng/mL): 725.9 ng/mL — ABNORMAL HIGH (ref 0.0–101.8)

## 2021-07-17 ENCOUNTER — Inpatient Hospital Stay: Payer: Medicare Other | Admitting: Oncology

## 2021-07-17 ENCOUNTER — Other Ambulatory Visit: Payer: Self-pay

## 2021-07-17 VITALS — BP 110/60 | HR 99 | Temp 97.8°F | Resp 18 | Ht 64.0 in | Wt 196.2 lb

## 2021-07-17 DIAGNOSIS — C7A092 Malignant carcinoid tumor of the stomach: Secondary | ICD-10-CM

## 2021-07-17 DIAGNOSIS — D6859 Other primary thrombophilia: Secondary | ICD-10-CM | POA: Diagnosis not present

## 2021-07-17 DIAGNOSIS — Z7901 Long term (current) use of anticoagulants: Secondary | ICD-10-CM | POA: Diagnosis not present

## 2021-07-17 DIAGNOSIS — D3A092 Benign carcinoid tumor of the stomach: Secondary | ICD-10-CM | POA: Diagnosis not present

## 2021-07-17 DIAGNOSIS — E039 Hypothyroidism, unspecified: Secondary | ICD-10-CM | POA: Diagnosis not present

## 2021-07-17 NOTE — Progress Notes (Signed)
[facility] OFFICE PROGRESS NOTE   Diagnosis: Carcinoid tumor  INTERVAL HISTORY:   [name] returns as scheduled.  She does not have consistent abdominal pain, nausea, or diarrhea.  She underwent an upper endoscopy and colonoscopy by Dr. Rush Landmark last month.  She was confirmed to have multiple gastric polyps with carcinoid tumors.  Objective:  Vital signs in last 24 hours:  Blood pressure 110/60, pulse 99, temperature 97.8 F (36.6 C), temperature source Oral, resp. rate 18, height 5\' 4"  (1.626 m), weight 196 lb 3.2 oz (89 kg), SpO2 100 %.    Lymphatics: No cervical, supraclavicular, axillary, or inguinal nodes Resp: Lungs clear bilaterally Cardio: Irregular GI: Nontender, no mass, no hepatosplenomegaly Vascular: No leg edema   Lab Results:  Lab Results  Component Value Date   WBC 6.3 07/10/2021   HGB 13.8 07/10/2021   HCT 40.5 07/10/2021   MCV 88.8 07/10/2021   PLT 177 07/10/2021   NEUTROABS 3.5 07/10/2021    CMP  Lab Results  Component Value Date   NA 137 02/14/2021   K 3.6 02/14/2021   CL 99 02/14/2021   CO2 30 02/14/2021   GLUCOSE 100 (H) 02/14/2021   BUN 16 02/14/2021   CREATININE 1.11 02/14/2021   CALCIUM 9.4 02/14/2021   PROT 7.1 02/14/2021   ALBUMIN 4.5 02/14/2021   AST 15 02/14/2021   ALT 17 02/14/2021   ALKPHOS 59 02/14/2021   BILITOT 0.8 02/14/2021   GFRNONAA 49 (L) 08/19/2020   GFRAA 57 (L) 08/19/2020     Medications: I have reviewed the patient's current medications.   Assessment/Plan: Multiple gastric carcinoid tumors Upper endoscopy 05/05/2019 with resection of multiple gastric carcinoid tumors EUS 07/12/2019-resection of multiple gastric carcinoid tumors, positive resection margin at the gastric cardia polypectomy site, tubular adenomas and hyperplastic polyps removed from the lesser curvature Elevated chromogranin A level CT abdomen/pelvis 05/18/2019-no evidence of a gastric mass or metastatic disease Netspot  08/11/2019-focal activity corresponding to a 2-3 mm lesion in the mid pancreas, diffuse activity in the stomach EGD 10/09/2019- 3 areas of scar/clip/polypoid tissue removed with negative pathology EUS 10/09/2019-4 mm pancreas body lesion, FNA biopsy negative, no adenopathy EUS 06/19/2020-no evidence of recurrent tumor in areas of gastric body scars, nodularity was noted underneath 2 endoclips in the greater curvature, this area was biopsied and polyps were removed from the middle gastric body and proximal gastric antrum, biopsies from erosions and erythema were taken at the greater curvature, lesser curvature, and antrum.  An irregular lesion was identified in the pancreas body measuring 3.4 x 4.6 mm, the pancreas lesion was biopsied.  The pathology from the pancreas FNA was negative, the pathology from multiple biopsies including the gastric body scar, antrum polyp, greater curvature biopsy, lesser curvature biopsy, and gastric cardia biopsy revealed low-grade neuroendocrine tumor Netspot 04/14/2021-no residual avid tumor in the stomach, no evidence of tracer avid metastatic disease, decrease in tracer activity associated with the pancreas lesion Upper endoscopy 922 22-5 gastric polyps resected and retrieved-hyperplastic polyp with well differentiated neuroendocrine tumor, stomach cardia and fundus biopsy-reactive gastropathy with neuroendocrine dysplasia, lesser curvature biopsy-reactive gastropathy with neuroendocrine hyperplasia History of iron deficiency anemia secondary bleeding from #1 and maintained on anticoagulation therapy Hypercoagulation syndrome, antiphospholipid syndrome-maintained on Coumadin Diabetes Abdominal pain-potentially related to #1 6.   Hypothyroidism 7.  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  Family history of uterine and breast cancer 9.  Gastric and colonic adenomas Colonoscopy 06/12/2021-polyps removed from the cecum, ascending colon, and  descending colon-tubular  adenomas and hyperplastic polyp 10.  Hypodense pancreas lesion 05/18/2019 -positive Netspot uptake 08/11/2019, negative FNA biopsy    Disposition: Erin Kirk appears stable.  The chromogranin a level has not changed significantly over the past year.  She underwent resection of multiple gastric carcinoid tumors last month.  The plan is to continue observation.  She will return for an office visit and chromogranin a level in 6 months.  She will continue endoscopic surveillance with Dr. Rush Landmark.  Betsy Coder, MD  07/17/2021  10:41 AM

## 2021-07-22 ENCOUNTER — Ambulatory Visit: Payer: Medicare Other | Admitting: Podiatry

## 2021-07-22 ENCOUNTER — Other Ambulatory Visit: Payer: Self-pay | Admitting: Family Medicine

## 2021-07-30 ENCOUNTER — Other Ambulatory Visit: Payer: Self-pay

## 2021-07-30 ENCOUNTER — Ambulatory Visit (INDEPENDENT_AMBULATORY_CARE_PROVIDER_SITE_OTHER): Payer: Medicare Other

## 2021-07-30 DIAGNOSIS — Z7901 Long term (current) use of anticoagulants: Secondary | ICD-10-CM | POA: Diagnosis not present

## 2021-07-30 LAB — POCT INR: INR: 3 (ref 2.0–3.0)

## 2021-07-30 NOTE — Progress Notes (Signed)
Continue taking 1 tablet daily except take 1/2 tablet on Mondays. Recheck in 5 wks.

## 2021-07-30 NOTE — Progress Notes (Signed)
I have reviewed and agree with note, evaluation, plan.   Loma Dubu[place], MD  

## 2021-07-30 NOTE — Patient Instructions (Addendum)
Pre visit review using our clinic review tool, if applicable. No additional management support is needed unless otherwise documented below in the visit note.  Continue taking 1 tablet daily except take 1/2 tablet on Mondays. Recheck in 5 wks.

## 2021-08-13 NOTE — Progress Notes (Incomplete)
Phone [phone] In person visit   Subjective:   Erin Kirk is a 65 y.o. year old very pleasant female patient who presents for/with See problem oriented charting No chief complaint on file.   This visit occurred during the SARS-CoV-2 public health emergency.  Safety protocols were in place, including screening questions prior to the visit, additional usage of staff PPE, and extensive cleaning of exam room while observing appropriate contact time as indicated for disinfecting solutions.   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[place]) 07/12/2019   Abnormal findings on esophagogastroduodenoscopy (EGD) 07/02/2019   Elevated Chromogranin 07/02/2019   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   Multiple gastric polyps 07/02/2019   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[place]) 05/12/2019   Iron deficiency anemia 03/30/2019   Hyperlipidemia associated with type 2 diabetes mellitus ([place]) 03/09/2019   Cough variant asthma 10/14/2018   Neuropathy due to medical condition ([place]) 11/09/2017   Long term (current) use of anticoagulants - warfarin 06/02/2017   Low vitamin B12 level 09/07/2016   Hx of adenomatous polyp of colon 04/10/2016   Disability examination 01/17/2015   CKD (chronic kidney disease), stage III ([place]) 09/05/2014   Encounter for therapeutic drug monitoring 10/26/2013   Left facial pain  09/24/2012   Oral mucosal lesion 09/24/2012   Diabetes mellitus type 2 in obese ([place]) 11/28/2010   PAROXYSMAL ATRIAL FIBRILLATION 07/02/2010   TRANSIENT DISORDER INITIATING/MAINTAINING SLEEP 04/02/2010   Overweight(278.02) 11/29/2009   LOW BACK PAIN 06/15/2008   Primary hypercoagulable state ([place]) 11/30/2007   Hypothyroidism 03/09/2007   Hypertension associated with diabetes ([place]) 03/09/2007   ALLERGIC RHINITIS 03/09/2007   GERD 03/09/2007   History of Melanoma 03/09/2007    Medications- reviewed and updated Current Outpatient Medications  Medication Sig Dispense Refill   acetaminophen (TYLENOL) 500 MG tablet Take 500 mg by mouth every 6 (six) hours as needed (for pain.).      Alum Hydroxide-Mag Trisilicate (GAVISCON) 89-37.3 MG CHEW Chew 1-2 tablets by mouth 2 (two) times daily as needed (indigestion).     Cholecalciferol (VITAMIN D3) 50 MCG (2000 UT) TABS Take 2,000 Units by mouth daily.     dicyclomine (BENTYL) 20 MG tablet Take 1 tablet (20 mg total) by mouth every 6 (six) hours as needed for spasms (abdominal cramps, pain). 60 tablet 0   diltiazem (CARDIZEM CD) 360 MG 24 hr capsule Take 1 capsule by mouth at bedtime 90 capsule 0   docusate sodium (COLACE) 100 MG capsule Take 100 mg by mouth at bedtime. Stool softner     enoxaparin (LOVENOX) 120 MG/0.8ML injection Inject 0.8 mLs (120 mg total) into the skin daily for 8 doses. 6.4 mL 0   ferrous sulfate 325 (65 FE) MG tablet Take 1 tablet (325 mg total) by mouth daily with breakfast. Lunch and supper (Patient taking differently: Take 325 mg by mouth  daily with breakfast.) 30 tablet 3   levothyroxine (SYNTHROID) 100 MCG tablet Take 1 tablet by mouth once daily 90 tablet 0   lovastatin (MEVACOR) 10 MG tablet TAKE 1 TABLET BY MOUTH AT BEDTIME (Patient taking differently: Take 10 mg by mouth once a week.) 90 tablet 0   methylPREDNISolone (MEDROL DOSEPAK) 4 MG TBPK tablet 6 day dose pack - take as directed 21 tablet 0   metoprolol  succinate (TOPROL-XL) 25 MG 24 hr tablet Take 1 tablet by mouth once daily 90 tablet 0   omeprazole (PRILOSEC) 40 MG capsule Take 1 capsule (40 mg total) by mouth in the morning and at bedtime. Twice daily for 1 month and then back to once daily. 60 capsule 5   telmisartan (MICARDIS) 40 MG tablet Take 1 tablet by mouth once daily 90 tablet 0   triamterene-hydrochlorothiazide (MAXZIDE-25) 37.5-25 MG tablet Take 1 tablet by mouth daily. 90 tablet 3   vitamin B-12 (CYANOCOBALAMIN) 1000 MCG tablet Take 1,000 mcg by mouth daily.     warfarin (COUMADIN) 5 MG tablet TAKE AS DIRECTED BY ANTICOGULATION CLINIC 105 tablet 0   No current facility-administered medications for this visit.     Objective:  LMP  (LMP Unknown)  Gen: NAD, resting comfortably CV: RRR no murmurs rubs or gallops Lungs: CTAB no crackles, wheeze, rhonchi Abdomen: soft/nontender/nondistended/normal bowel sounds. No rebound or guarding.  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 In the setting of her neuroendocrine tumors and her prior tubular adenomas this cyst fits the picture of still a type I gastric neuroendocrine type of situation. The biopsies of the stomach were not showing evidence of overt significant neuroendocrine tumors which was good. The consensus from radiology and pathology and oncology as well as GI and surgery was that we should monitor the patient periodically and remove polyps if they occur. However a total gastrectomy  or partial gastrectomy would not be in the recommendation of everyone at this time. Thus surveillance was helpful. In regards to the small subcentimeter lesion in the pancreas that had been biopsied twice we certainly can continue to monitor that as well.- Dr. 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 Rush Landmark . follow chromogranin levels and PET scans. EGD q6-8 months.   # primary hypercoagulable state with history of multiple DVT and PE #acquired thrombophilia on coumadin S: patient remains on Coumadin long term -requires lovenox bridge for procedures.  A/P: ***   # Atrial fibrillation S: Rate controlled with  diltiazem 360mg  ER Anticoagulated with  warfarin. Also requires lovenox bridge for procedures Chadsvasc score of *** A/P: ***   # Iron deficiency anemia- iron levels have been low- currently taking . Also on high dose PPI through GI.  Lab Results Component Value Date FERRITIN 9.5 (L) 05/28/2020  #hypertension S: medication:  diltiazem 360mg  XR, metoprolol 25 mg XR, telmisartan 40mg , triamterne hctz 37.5-25 mg Home readings #s: *** BP Readings from Last 3  Encounters:  07/17/21 110/60  06/12/21 129/75  03/10/21 112/88  A/P: ***  #hyperlipidemia- she prefers to stay on lowest dose possible S: Medication: lovastatin 10 mg once a week Lab Results  Component Value Date   CHOL 159 08/19/2020   HDL 63 08/19/2020   LDLCALC 78 08/19/2020   LDLDIRECT 39.0 03/10/2019   TRIG 98 08/19/2020   CHOLHDL 2.5 08/19/2020   A/P: ***  # Diabetes S: Medication: diet controlled CBGs- *** Exercise and diet- *** Lab Results  Component Value Date   HGBA1C 6.1 02/14/2021   HGBA1C 6.0 (H) 08/19/2020   HGBA1C 6.3 02/14/2020    A/P: ***  #hypothyroidism S: compliant On thyroid medication- levothyroxine 100 mcg daily Lab Results  Component Value Date   TSH 1.33 02/14/2021    A/P:***   # B12  deficiency S: history of neuropathy with this. Metformin likely contributed. Doing well now on oral medication after time on injectoins.  Lab Results  Component Value Date   VITAMINB12 1,122 (H) 02/14/2021   A/P: ***   #Vitamin D deficiency S: Medication: 2000 units daily Last vitamin D Lab Results  Component Value Date   VD25OH 42.45 02/14/2021   A/P: ***    Health Maintenance Due  Topic Date Due   Zoster Vaccines- Shingrix (1 of 2) Never done   Pneumonia Vaccine 39+ Years old (3 - PCV) 02/13/2021   COVID-19 Vaccine (5 - Booster for Pfizer series) 06/18/2021   Recommended follow up: No follow-ups on file. Future Appointments  Date Time Provider [place]  08/22/2021 11:00 AM Marin Olp, MD [facility]  08/27/2021 10:30 AM Gatha Mayer, MD [facility]  09/03/2021 10:00 AM [facility]  01/08/2022 10:00 AM [facility] PHLEBOTOMIST [facility] None  01/15/2022 11:20 AM Ladell Pier, MD [facility] None    Lab/Order associations: No diagnosis found.  No orders of the defined types were placed in this encounter.   I,Jada Bradford,acting as a scribe for Garret Reddish, MD.,have documented all relevant documentation on the behalf of Garret Reddish, MD,as directed by  Garret Reddish, MD while in the presence of Garret Reddish, MD.  *** Return precautions advised.  Burnett Corrente

## 2021-08-22 ENCOUNTER — Ambulatory Visit: Payer: Medicare Other | Admitting: Family Medicine

## 2021-08-22 DIAGNOSIS — E1159 Type 2 diabetes mellitus with other circulatory complications: Secondary | ICD-10-CM

## 2021-08-22 DIAGNOSIS — D509 Iron deficiency anemia, unspecified: Secondary | ICD-10-CM

## 2021-08-22 DIAGNOSIS — I480 Paroxysmal atrial fibrillation: Secondary | ICD-10-CM

## 2021-08-22 DIAGNOSIS — E039 Hypothyroidism, unspecified: Secondary | ICD-10-CM

## 2021-08-22 DIAGNOSIS — E1169 Type 2 diabetes mellitus with other specified complication: Secondary | ICD-10-CM

## 2021-08-26 ENCOUNTER — Other Ambulatory Visit: Payer: Self-pay | Admitting: Family Medicine

## 2021-08-27 ENCOUNTER — Ambulatory Visit: Payer: Medicare Other | Admitting: Internal Medicine

## 2021-08-27 ENCOUNTER — Encounter: Payer: Self-pay | Admitting: Internal Medicine

## 2021-08-27 VITALS — BP 122/68 | HR 70 | Ht 64.0 in | Wt 194.0 lb

## 2021-08-27 DIAGNOSIS — K869 Disease of pancreas, unspecified: Secondary | ICD-10-CM

## 2021-08-27 DIAGNOSIS — K317 Polyp of stomach and duodenum: Secondary | ICD-10-CM | POA: Diagnosis not present

## 2021-08-27 DIAGNOSIS — K31A0 Gastric intestinal metaplasia, unspecified: Secondary | ICD-10-CM | POA: Diagnosis not present

## 2021-08-27 DIAGNOSIS — Z7901 Long term (current) use of anticoagulants: Secondary | ICD-10-CM | POA: Diagnosis not present

## 2021-08-27 DIAGNOSIS — Z86010 Personal history of colonic polyps: Secondary | ICD-10-CM

## 2021-08-27 DIAGNOSIS — C7A8 Other malignant neuroendocrine tumors: Secondary | ICD-10-CM

## 2021-08-27 DIAGNOSIS — R899 Unspecified abnormal finding in specimens from other organs, systems and tissues: Secondary | ICD-10-CM | POA: Diagnosis not present

## 2021-08-27 DIAGNOSIS — Z860101 Personal history of adenomatous and serrated colon polyps: Secondary | ICD-10-CM

## 2021-08-27 NOTE — Patient Instructions (Signed)
Glad you are feeling ok.  As we discussed - next time for an upper endoscopy is around 05/2022.  If you get stomach or bowel problems that last - contact me before then.  I appreciate the opportunity to care for you. Gatha Mayer, MD, [facility]

## 2021-08-27 NOTE — Progress Notes (Signed)
Erin Kirk 65 y.o. [birth year] [id number]  Assessment & Plan:   Encounter Diagnoses  Name Primary?   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  Plan for a routine EGD i at the hospital a year from last (September 2023).  I will sample her lesions then and be prepared for any type of snare polypectomy etc.  We discussed [facility] versus hospital but thought that the hospital allowed [place] better capabilities and protection.  She will need to hold her anticoagulation prior to that as well.  She has used Lovenox bridging in the past.  She will continue her iron supplementation.   Repeat colonoscopy September 2025.  She struggles with the prep we will try a low volume prep if possible. Subjective:   Chief Complaint:  HPI [name] returns for follow-up, she has been under the expert care of Dr. 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   She is followed by Dr. Benay Kirk for her carcinoid.  She has a persistently elevated chromogranin level.  Lab Results  Component Value Date   WBC 6.3 07/10/2021   HGB 13.8 07/10/2021   HCT 40.5 07/10/2021   MCV 88.8 07/10/2021   PLT 177 07/10/2021   She feels okay at this time.  No specific GI complaints.  Not sure that she is ever really had much of those.  She is here to reestablish care with me after having multiple procedures under Dr. Rush Kirk  EGD 05/05/2019  multiple gastric polyps  FINAL DIAGNOSIS Diagnosis 1. Surgical [P], proximal gastric polyp  (large) - WELL DIFFERENTIATED NEUROENDOCRINE NEOPLASM - SEE COMMENT 2. Surgical [P], distal gastric polyps (multiple) - WELL DIFFERENTIATED NEUROENDOCRINE NEOPLASM - GASTRIC ADENOMA - SEE COMMENT   EUS 07/12/2019 EGD Impression: - No gross lesions in esophagus. - A single gastric polyp. Resected and retrieved. Injected. Treated with a hot snare. Clips (MR conditional) were placed. - Two gastric polyps. Resected and retrieved. Clips (MR conditional) were placed. - Three gastric polyps. Resected and retrieved. Clips (MR conditional) were placed. - One gastric polyp. Resected and retrieved. Clips (MR conditional) were placed. - Normal mucosa was found in the antrum. - No gross lesions in the duodenal bulb, in the first portion of the duodenum and in the second portion of the duodenum. EUS Impression: - Mucosal lesions were found in the cardia of the stomach and in the body of the stomach. The lesions appeared to originate from within the luminal interface/superficial mucosa (Layer 1) and deep mucosa (Layer 2) without involvement of the muscularis propria. These are consistent with the previous NETs and Gastric Adenoma noted on prior procedure. - No malignant-appearing lymph nodes were visualized in the left gastric region (level 17), gastrohepatic ligament (level 18), celiac region (level 20) and perigastric region.  A. STOMACH, LESSER CURVE, POLYPECTOMY:  - Tubular adenoma(s).  - Hyperplastic polyp(s).  - Goblet cell metaplasia.  - High-grade dysplasia is not identified.   B. STOMACH, BODY, POLYPECTOMY:  -  Low-grade neuroendocrine tumor(s) (carcinoid tumor(s)).  - Goblet cell metaplasia.   C. STOMACH, POSTERIOR WALL, POLYPECTOMY:  - Low-grade neuroendocrine tumor(s) (carcinoid tumor(s)).   D. STOMACH, CARDIA, POLYPECTOMY:  - Low-grade neuroendocrine tumor (carcinoid tumor).  - Tumor involves the cauterized tissue edge.  - See comment EUS 10/09/2019   EGD Impression: - No  gross lesions in esophagus. Z-line irregular, 40 cm from the incisors. - Scar in the gastric body (greater curvature). Removed clips and polypoid tissue. Clips (MR conditional) were placed. - Scar in the gastric body (greater curvature). Removed clips and polypoid tissue. Clips (MR conditional) were placed. - Scar in the gastric body (greater curvature). Removed clips and polypoid tissue. Clips (MR conditional) were placed. - Erosive gastropathy with no bleeding and no stigmata of recent bleeding. - No gross lesions in the duodenal bulb, in the first portion of the duodenum and in the second portion of the duodenum. EUS Impression: - A lesion was identified in the pancreatic body. Tissue was obtained from this exam, and results are pending. However, the endosonographic appearance is suggestive of a neuroendocrine tumor (DOTATATE suggested this). Fine needle biopsy performed, though difficult in region to get steady sampling and due to the overall size of the reported lesion.. - There was no sign of significant pathology in the common bile duct and in the common hepatic duct. - No malignant-appearing lymph nodes were visualized in the celiac region (level 20), peripancreatic region and porta hepatis region. Marland Kitchen STOMACH, LESSER CURVATURE SCAR, BIOPSY:  - Mildly active chronic gastritis with focal erosion.  - No intestinal metaplasia, dysplasia or carcinoma.   B. STOMACH, GREATER CURVE SCAR SITE, BIOPSY:  - Mildly active chronic gastritis with focal intestinal metaplasia.  - No dysplasia or carcinoma.   C. STOMACH, PROXIMAL GREATER CURVE SCAR, BIOPSY:  - Mildly active chronic gastritis with focal erosion and intestinal  metaplasia.  - No dysplasia or carcinoma.  EUS 06/19/2020 EGD Impression: - No gross lesions in esophagus. Z-line regular, 36 cm from the incisors. - 2 cm hiatal hernia. - Scars noted in the gastric body. - Endoclips were found in the stomach. Removed. 1 clip placed in  area to decrease bleeding risk. - Two gastric polyps. Resected and retrieved via mucosal resection. Clips (MR conditional) were placed to close defects and decrease risk of bleeding. - Gastritis with history of intestinal metaplasia - Gastric mapping biopsies performed. - No gross lesions in the duodenal bulb, in the first portion of the duodenum and in the second portion of the duodenum. EUS Impression: - A lesion was identified in the pancreatic body. Cytology results are pending. However, the endosonographic appearance is suggestive of a possible neuroendocrine tumor (as evidenced by prior DOTATATE that had some uptake in this region. - There was dilation in the common bile duct and in the common hepatic duct. - No malignant-appearing lymph nodes were visualized in the celiac region (level 20), perigastric region and peripancreatic region.  A. GASTRIC ANTRUM POLYP, ENDOSCOPIC MUCOSAL RESECTION:  - Hyperplastic polyp with neuroendocrine dysplasia.  - Negative for goblet cell metaplasia.   B. GASTRIC BODY SCAR, RESECTION:  - Neuroendocrine dysplasia.  - Goblet cell metaplasia, negative for dysplasia.   C. GASTRIC LESSER CURVE, ENDOSCOPIC MUCOSAL RESECTION:  - Goblet cell metaplasia, focal, negative for dysplasia.   D. GASTRIC ANTRUM, BIOPSY:  - Reactive gastropathy.  - Negative for goblet cell metaplasia.   E. GASTRIC GREATER CURVE, BIOPSY:  - Neuroendocrine dysplasia.  - Goblet cell metaplasia, focal, negative for  dysplasia.   F. GASTRIC LESSER CURVE, BIOPSY:  - Neuroendocrine dysplasia.  - Goblet cell metaplasia, negative for dysplasia.   G. GASTRIC CARDIA FUNDUS, BIOPSY:  - Neuroendocrine dysplasia.  - Goblet cell metaplasia, negative for dysplasia.   H. GASTRIC INCISURA, BIOSPY:  - Reactive gastropathy.  - Negative for goblet cell metaplasia.  EGD 06/12/2021   No gross lesions in esophagus. Z-line irregular, 35 cm from the incisors. - 2 cm hiatal hernia. - Five  gastric polyps. Resected and retrieved. Clips (MR conditional) were placed on each resection. - Erythematous mucosa in the stomach. Biopsied. - No gross lesions in the duodenal    A. STOMACH, NODULES, EXCISION:  - Hyperplastic polyp with well differentiated neuroendocrine tumor.  - Focal intestinal metaplasia.   B. STOMACH, ANTRUM, BIOPSY:  - Reactive gastropathy.  - No intestinal metaplasia, dysplasia, or malignancy.   C. STOMACH, INCISURA, BIOPSY:  - Reactive gastropathy.  - No intestinal metaplasia, dysplasia, or malignancy.   D. STOMACH, GREATER CURVE, BIOPSY:  - Reactive gastropathy with focal intestinal metaplasia.  - No dysplasia or malignancy.   E. STOMACH, LESSER CURVE, BIOPSY:  - Reactive gastropathy with neuroendocrine hyperplasia.  - No intestinal metaplasia.   F. STOMACH, CARDIA AND FUNDUS, BIOPSY:  - Reactive gastropathy with neuroendocrine dysplasia and focal  intestinal metaplasia.    Allergies  Allergen Reactions   Guaifenesin & Derivatives Nausea And Vomiting    Hallucinations   Iohexol Anaphylaxis, Hives and Shortness Of Breath    Incident 1990, premedicated prior to obtaining IV contrast since.   Amoxicillin-Pot Clavulanate Swelling and Other (See Comments)    Facial swelling Did it involve swelling of the face/tongue/throat, SOB, or low BP? Yes Did it involve sudden or severe rash/hives, skin peeling, or any reaction on the inside of your mouth or nose? No Did you need to seek medical attention at a hospital or doctor's office? No When did it last happen?~7-8 years ago   If all above answers are "NO", may proceed with cephalosporin use.    Other     Contrast dye   No outpatient medications have been marked as taking for the 08/27/21 encounter (Office Visit) with Gatha Mayer, MD.   Past Medical History:  Diagnosis Date   Allergy    Anxiety    Arthritis    Asthma    many years ago   Blood transfusion without reported diagnosis     Cataract    Chronic kidney disease    Clotting disorder ([place])    Depression    Husband died 2019/06/29   Diabetes ([place])    not on medication   DVT (deep venous thrombosis) ([place])    left arm   Dysrhythmia    Family history of bone cancer    Family history of breast cancer    Family history of lung cancer    Family history of ovarian cancer    Family history of testicular cancer    Family history of uterine cancer    GERD (gastroesophageal reflux disease)    Headache    Hypertension    Hypothyroidism    Iron deficiency anemia 03/30/2019   Irregular heart beat    tachycardia   Lupus anticoagulant with hypercoagulable state ([place])    Melanoma ([place])    2005- face    Melanoma ([place])    face   Plantar fasciitis    Pneumonia 2019   Primary hypercoagulable state ([place])    Primary malignant neuroendocrine tumor of stomach (HCC)-well  differentiated 05/12/2019   Pulmonary embolism ([place])    Thyroid disease    hypothyroidism   Past Surgical History:  Procedure Laterality Date   ABDOMINAL HYSTERECTOMY     noncancerous   BACK SURGERY  1983   BIOPSY  06/19/2020   Procedure: BIOPSY;  Surgeon: Irving Copas., MD;  Location: [facility];  Service: Gastroenterology;;   BIOPSY  06/12/2021   Procedure: BIOPSY;  Surgeon: Irving Copas., MD;  Location: [facility];  Service: Gastroenterology;;   CHOLECYSTECTOMY     COLONOSCOPY     COLONOSCOPY WITH PROPOFOL N/A 06/12/2021   Procedure: COLONOSCOPY WITH PROPOFOL;  Surgeon: Irving Copas., MD;  Location: [facility];  Service: Gastroenterology;  Laterality: N/A;   ENDOSCOPIC MUCOSAL RESECTION N/A 07/12/2019   Procedure: ENDOSCOPIC MUCOSAL RESECTION;  Surgeon: Erin Kirk Telford Nab., MD;  Location: [facility];  Service: Gastroenterology;  Laterality: N/A;   ENDOSCOPIC MUCOSAL RESECTION  06/19/2020   Procedure: ENDOSCOPIC MUCOSAL RESECTION;  Surgeon: Erin Kirk Telford Nab., MD;  Location: [facility];  Service:  Gastroenterology;;   ESOPHAGOGASTRODUODENOSCOPY N/A 06/19/2020   Procedure: ESOPHAGOGASTRODUODENOSCOPY (EGD);  Surgeon: Irving Copas., MD;  Location: [facility];  Service: Gastroenterology;  Laterality: N/A;   ESOPHAGOGASTRODUODENOSCOPY (EGD) WITH PROPOFOL N/A 07/12/2019   Procedure: ESOPHAGOGASTRODUODENOSCOPY (EGD) WITH PROPOFOL;  Surgeon: Erin Kirk Telford Nab., MD;  Location: [facility];  Service: Gastroenterology;  Laterality: N/A;   ESOPHAGOGASTRODUODENOSCOPY (EGD) WITH PROPOFOL N/A 10/09/2019   Procedure: ESOPHAGOGASTRODUODENOSCOPY (EGD) WITH PROPOFOL;  Surgeon: Erin Kirk Telford Nab., MD;  Location: [facility];  Service: Gastroenterology;  Laterality: N/A;   ESOPHAGOGASTRODUODENOSCOPY (EGD) WITH PROPOFOL N/A 06/12/2021   Procedure: ESOPHAGOGASTRODUODENOSCOPY (EGD) WITH PROPOFOL;  Surgeon: Erin Kirk Telford Nab., MD;  Location: [facility];  Service: Gastroenterology;  Laterality: N/A;   EUS N/A 07/12/2019   Procedure: UPPER ENDOSCOPIC ULTRASOUND (EUS) RADIAL;  Surgeon: Irving Copas., MD;  Location: [facility];  Service: Gastroenterology;  Laterality: N/A;   EUS N/A 10/09/2019   Procedure: UPPER ENDOSCOPIC ULTRASOUND (EUS) RADIAL;  Surgeon: Irving Copas., MD;  Location: [facility];  Service: Gastroenterology;  Laterality: N/A;   fatty tumor breast Right    removed   FINE NEEDLE ASPIRATION  06/19/2020   Procedure: FINE NEEDLE ASPIRATION (FNA) LINEAR;  Surgeon: Irving Copas., MD;  Location: [facility];  Service: Gastroenterology;;   FOREIGN BODY REMOVAL  10/09/2019   Procedure: FOREIGN BODY REMOVAL;  Surgeon: Irving Copas., MD;  Location: [facility];  Service: Gastroenterology;;   FOREIGN BODY REMOVAL  06/19/2020   Procedure: FOREIGN BODY REMOVAL;  Surgeon: Irving Copas., MD;  Location: [facility];  Service: Gastroenterology;;  polyp removal with net    HEMOSTASIS CLIP PLACEMENT  07/12/2019   Procedure: HEMOSTASIS CLIP  PLACEMENT;  Surgeon: Irving Copas., MD;  Location: [facility];  Service: Gastroenterology;;   HEMOSTASIS CLIP PLACEMENT  10/09/2019   Procedure: HEMOSTASIS CLIP PLACEMENT;  Surgeon: Irving Copas., MD;  Location: [facility];  Service: Gastroenterology;;   HEMOSTASIS CLIP PLACEMENT  06/19/2020   Procedure: HEMOSTASIS CLIP PLACEMENT;  Surgeon: Irving Copas., MD;  Location: [facility];  Service: Gastroenterology;;   HEMOSTASIS CLIP PLACEMENT  06/12/2021   Procedure: HEMOSTASIS CLIP PLACEMENT;  Surgeon: Irving Copas., MD;  Location: [facility];  Service: Gastroenterology;;   HEMOSTASIS CONTROL  07/12/2019   Procedure: HEMOSTASIS CONTROL;  Surgeon: Irving Copas., MD;  Location: [place] [facility];  Service: Gastroenterology;;   HYSTEROTOMY     KNEE ARTHROSCOPY Left    Lymp nodes Right 08/2004  MELANOMA EXCISION Right 2005   face    OOPHORECTOMY Bilateral    POLYPECTOMY  07/12/2019   Procedure: POLYPECTOMY;  Surgeon: Mansouraty, Telford Nab., MD;  Location: [facility];  Service: Gastroenterology;;   POLYPECTOMY  10/09/2019   Procedure: POLYPECTOMY;  Surgeon: Irving Copas., MD;  Location: [facility];  Service: Gastroenterology;;   POLYPECTOMY  06/19/2020   Procedure: POLYPECTOMY;  Surgeon: Irving Copas., MD;  Location: [facility];  Service: Gastroenterology;;   POLYPECTOMY  06/12/2021   Procedure: POLYPECTOMY;  Surgeon: Irving Copas., MD;  Location: [facility];  Service: Gastroenterology;;   removal lymphnodes in right neck     SUBMUCOSAL LIFTING INJECTION  07/12/2019   Procedure: SUBMUCOSAL LIFTING INJECTION;  Surgeon: Irving Copas., MD;  Location: [facility];  Service: Gastroenterology;;   SUBMUCOSAL LIFTING INJECTION  10/09/2019   Procedure: SUBMUCOSAL LIFTING INJECTION;  Surgeon: Irving Copas., MD;  Location: [facility];  Service: Gastroenterology;;   Maryagnes Amos INJECTION   06/12/2021   Procedure: SUBMUCOSAL LIFTING INJECTION;  Surgeon: Irving Copas., MD;  Location: [facility];  Service: Gastroenterology;;   UPPER ESOPHAGEAL ENDOSCOPIC ULTRASOUND (EUS) N/A 06/19/2020   Procedure: UPPER ESOPHAGEAL ENDOSCOPIC ULTRASOUND (EUS);  Surgeon: Irving Copas., MD;  Location: [facility];  Service: Gastroenterology;  Laterality: N/A;   UPPER GASTROINTESTINAL ENDOSCOPY     Social History   Social History Narrative   Married 1971. 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    family history includes Alzheimer's disease in her maternal aunt; Bone cancer (age of onset: 60) in her nephew; Breast cancer in her niece and other family members; Breast cancer (age of onset: 16) in her half-sister and half-sister; Breast cancer (age of onset: 63) in her maternal aunt; Cancer (age of onset: 9) in her niece; Coronary artery disease in her mother; Diabetes in her father, mother, and sister; Hypertension in her father; Lung cancer in her nephew; Mitral valve prolapse in her sister; Ovarian cancer (age of onset: 69) in her mother; Uterine cancer (age of onset: 14) in her niece.   Review of Systems   Objective:   Physical Exam

## 2021-09-03 ENCOUNTER — Other Ambulatory Visit: Payer: Self-pay

## 2021-09-03 ENCOUNTER — Other Ambulatory Visit: Payer: Self-pay | Admitting: Family Medicine

## 2021-09-03 ENCOUNTER — Ambulatory Visit (INDEPENDENT_AMBULATORY_CARE_PROVIDER_SITE_OTHER): Payer: Medicare Other

## 2021-09-03 DIAGNOSIS — Z7901 Long term (current) use of anticoagulants: Secondary | ICD-10-CM | POA: Diagnosis not present

## 2021-09-03 LAB — POCT INR: INR: 3.3 — AB (ref 2.0–3.0)

## 2021-09-03 NOTE — Patient Instructions (Addendum)
Pre visit review using our clinic review tool, if applicable. No additional management support is needed unless otherwise documented below in the visit note.  Continue taking 1 tablet daily except take 1/2 tablet on Mondays. Recheck in 6 wks. 

## 2021-09-03 NOTE — Progress Notes (Signed)
Continue taking 1 tablet daily except take 1/2 tablet on Mondays. Recheck in 6 wks. 

## 2021-09-04 DIAGNOSIS — Z85828 Personal history of other malignant neoplasm of skin: Secondary | ICD-10-CM | POA: Diagnosis not present

## 2021-09-04 DIAGNOSIS — D2271 Melanocytic nevi of right lower limb, including hip: Secondary | ICD-10-CM | POA: Diagnosis not present

## 2021-09-04 DIAGNOSIS — D485 Neoplasm of uncertain behavior of skin: Secondary | ICD-10-CM | POA: Diagnosis not present

## 2021-09-04 DIAGNOSIS — C44612 Basal cell carcinoma of skin of right upper limb, including shoulder: Secondary | ICD-10-CM | POA: Diagnosis not present

## 2021-09-04 DIAGNOSIS — D1801 Hemangioma of skin and subcutaneous tissue: Secondary | ICD-10-CM | POA: Diagnosis not present

## 2021-09-04 DIAGNOSIS — D225 Melanocytic nevi of trunk: Secondary | ICD-10-CM | POA: Diagnosis not present

## 2021-09-04 DIAGNOSIS — Z85820 Personal history of malignant melanoma of skin: Secondary | ICD-10-CM | POA: Diagnosis not present

## 2021-09-04 DIAGNOSIS — D2262 Melanocytic nevi of left upper limb, including shoulder: Secondary | ICD-10-CM | POA: Diagnosis not present

## 2021-09-04 DIAGNOSIS — D2272 Melanocytic nevi of left lower limb, including hip: Secondary | ICD-10-CM | POA: Diagnosis not present

## 2021-09-09 ENCOUNTER — Encounter: Payer: Self-pay | Admitting: Family Medicine

## 2021-09-09 ENCOUNTER — Ambulatory Visit (INDEPENDENT_AMBULATORY_CARE_PROVIDER_SITE_OTHER): Payer: Medicare Other | Admitting: Family Medicine

## 2021-09-09 VITALS — BP 112/64 | HR 67 | Temp 98.0°F | Ht 64.0 in | Wt 194.2 lb

## 2021-09-09 DIAGNOSIS — E1169 Type 2 diabetes mellitus with other specified complication: Secondary | ICD-10-CM | POA: Diagnosis not present

## 2021-09-09 DIAGNOSIS — C7A8 Other malignant neuroendocrine tumors: Secondary | ICD-10-CM | POA: Diagnosis not present

## 2021-09-09 DIAGNOSIS — R7989 Other specified abnormal findings of blood chemistry: Secondary | ICD-10-CM

## 2021-09-09 DIAGNOSIS — G63 Polyneuropathy in diseases classified elsewhere: Secondary | ICD-10-CM

## 2021-09-09 DIAGNOSIS — E538 Deficiency of other specified B group vitamins: Secondary | ICD-10-CM | POA: Diagnosis not present

## 2021-09-09 DIAGNOSIS — E669 Obesity, unspecified: Secondary | ICD-10-CM

## 2021-09-09 DIAGNOSIS — E785 Hyperlipidemia, unspecified: Secondary | ICD-10-CM

## 2021-09-09 DIAGNOSIS — D6859 Other primary thrombophilia: Secondary | ICD-10-CM

## 2021-09-09 DIAGNOSIS — I10 Essential (primary) hypertension: Secondary | ICD-10-CM | POA: Diagnosis not present

## 2021-09-09 DIAGNOSIS — I480 Paroxysmal atrial fibrillation: Secondary | ICD-10-CM | POA: Diagnosis not present

## 2021-09-09 DIAGNOSIS — E559 Vitamin D deficiency, unspecified: Secondary | ICD-10-CM

## 2021-09-09 DIAGNOSIS — E039 Hypothyroidism, unspecified: Secondary | ICD-10-CM

## 2021-09-09 LAB — COMPREHENSIVE METABOLIC PANEL
ALT: 15 U/L (ref 0–35)
AST: 15 U/L (ref 0–37)
Albumin: 4.5 g/dL (ref 3.5–5.2)
Alkaline Phosphatase: 60 U/L (ref 39–117)
BUN: 16 mg/dL (ref 6–23)
CO2: 29 mEq/L (ref 19–32)
Calcium: 9.4 mg/dL (ref 8.4–10.5)
Chloride: 99 mEq/L (ref 96–112)
Creatinine, Ser: 1.18 mg/dL (ref 0.40–1.20)
GFR: 47.78 mL/min — ABNORMAL LOW (ref >60.00)
Glucose, Bld: 84 mg/dL (ref 70–99)
Potassium: 3.8 mEq/L (ref 3.5–5.1)
Sodium: 137 mEq/L (ref 135–145)
Total Bilirubin: 0.8 mg/dL (ref 0.2–1.2)
Total Protein: 7 g/dL (ref 6.0–8.3)

## 2021-09-09 LAB — CBC WITH DIFFERENTIAL/PLATELET
Basophils Absolute: 0.1 10*3/uL (ref 0.0–0.1)
Basophils Relative: 1.1 % (ref 0.0–3.0)
Eosinophils Absolute: 0.4 10*3/uL (ref 0.0–0.7)
Eosinophils Relative: 5.8 % — ABNORMAL HIGH (ref 0.0–5.0)
HCT: 39.8 % (ref 36.0–46.0)
Hemoglobin: 13.3 g/dL (ref 12.0–15.0)
Lymphocytes Relative: 30.6 % (ref 12.0–46.0)
Lymphs Abs: 2.2 10*3/uL (ref 0.7–4.0)
MCHC: 33.5 g/dL (ref 30.0–36.0)
MCV: 88.9 fl (ref 78.0–100.0)
Monocytes Absolute: 0.5 10*3/uL (ref 0.1–1.0)
Monocytes Relative: 6.9 % (ref 3.0–12.0)
Neutro Abs: 4 10*3/uL (ref 1.4–7.7)
Neutrophils Relative %: 55.6 % (ref 43.0–77.0)
Platelets: 193 10*3/uL (ref 150.0–400.0)
RBC: 4.47 Mil/uL (ref 3.87–5.11)
RDW: 13 % (ref 11.5–15.5)
WBC: 7.2 10*3/uL (ref 4.0–10.5)

## 2021-09-09 LAB — LIPID PANEL
Cholesterol: 156 mg/dL (ref 0–200)
HDL: 49.4 mg/dL (ref >39.00)
LDL Cholesterol: 86 mg/dL (ref 0–99)
NonHDL: 106.97
Total CHOL/HDL Ratio: 3
Triglycerides: 104 mg/dL (ref 0.0–149.0)
VLDL: 20.8 mg/dL (ref 0.0–40.0)

## 2021-09-09 LAB — TSH: TSH: 1.27 u[IU]/mL (ref 0.35–5.50)

## 2021-09-09 LAB — VITAMIN B12: Vitamin B-12: 1416 pg/mL — ABNORMAL HIGH (ref 211–911)

## 2021-09-09 LAB — HEMOGLOBIN A1C: Hgb A1c MFr Bld: 6.3 % (ref 4.6–6.5)

## 2021-09-09 NOTE — Patient Instructions (Addendum)
Health Maintenance Due  Topic Date Due   Zoster Vaccines- Shingrix (1 of 2) - Patient will plan to have this completed next year.  Never done   Pneumonia Vaccine 62+ Years old (3 - PCV) - Hold off on Pneumonia shot for now.  02/13/2021   COVID-19 Vaccine (5 - Booster for [employer] series) - Team please log date of Bivalent booster on 07/04/2021.  06/18/2021   Please stop by lab before you go If you have mychart- we will send your results within 3 business days of [place] receiving them.  If you do not have mychart- we will call you about results within 5 business days of [place] receiving them.  *please also note that you will see labs on mychart as soon as they post. I will later go in and write notes on them- will say "notes from Dr. Yong Channel"  With poor control of your plantar fascia, I advise you schedule a follow-up with podiatry to discuss next steps.  Recommended follow up: Return in about 6 months (around 03/10/2022) for a physical.

## 2021-09-09 NOTE — Progress Notes (Signed)
Phone [phone] In person visit   Subjective:   Erin Kirk is a 65 y.o. year old very pleasant female patient who presents for/with See problem oriented charting Chief Complaint  Patient presents with   Follow-up    6 month follow up, would like plantar fascitis checked in right foot, very painful x few weeks. Some low BP readings.     This visit occurred during the SARS-CoV-2 public health emergency.  Safety protocols were in place, including screening questions prior to the visit, additional usage of staff PPE, and extensive cleaning of exam room while observing appropriate contact time as indicated for disinfecting solutions.   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[place]) 05/12/2019    Priority: High   Low vitamin B12 level 09/07/2016    Priority: High   Disability examination 01/17/2015    Priority: High   Diabetes mellitus type 2 in obese ([place]) 11/28/2010    Priority: High   PAROXYSMAL ATRIAL FIBRILLATION 07/02/2010    Priority: High   Primary hypercoagulable state ([place]) 11/30/2007    Priority: High   Osteopenia 02/26/2020    Priority: Medium    Vitamin D deficiency 07/20/2019    Priority: Medium    Iron deficiency anemia 03/30/2019    Priority: Medium    Hyperlipidemia associated with type 2 diabetes mellitus ([place]) 03/09/2019    Priority: Medium    Cough variant asthma 10/14/2018    Priority: Medium    Neuropathy due to medical condition ([place]) 11/09/2017    Priority: Medium    Hx of adenomatous polyp of colon 04/10/2016    Priority: Medium    CKD (chronic kidney disease), stage III ([place]) 09/05/2014    Priority: Medium    Hypothyroidism 03/09/2007    Priority: Medium    Essential hypertension 03/09/2007    Priority: Medium    History  of Melanoma 03/09/2007    Priority: Medium    Abnormal findings on esophagogastroduodenoscopy (EGD) 07/02/2019    Priority: Low   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    Priority: Low   Multiple gastric polyps 07/02/2019    Priority: Low   Long term (current) use of anticoagulants - warfarin 06/02/2017    Priority: Low   Encounter for therapeutic drug monitoring 10/26/2013    Priority: Low   Left facial pain 09/24/2012    Priority: Low   Oral mucosal lesion 09/24/2012    Priority: Low   TRANSIENT DISORDER INITIATING/MAINTAINING SLEEP 04/02/2010    Priority: Low   Overweight(278.02) 11/29/2009    Priority: Low   LOW BACK PAIN 06/15/2008    Priority: Low   ALLERGIC RHINITIS 03/09/2007    Priority: Low   GERD 03/09/2007    Priority: Low   Pancreatic lesion 04/21/2020   Abnormal finding on radiology exam 04/21/2020   Intestinal metaplasia of gastric mucosa 04/21/2020   Acquired thrombophilia ([place]) 02/14/2020   Myalgia due to statin 02/14/2020   Genetic testing 08/15/2019   Family history of breast cancer    Family history of ovarian cancer    Family history of uterine cancer    Family history of lung cancer    Family history of bone cancer    Family history of testicular cancer     Medications- reviewed and updated Current Outpatient  Medications  Medication Sig Dispense Refill   acetaminophen (TYLENOL) 500 MG tablet Take 500 mg by mouth every 6 (six) hours as needed (for pain.).      Alum Hydroxide-Mag Trisilicate (GAVISCON) 10-93.2 MG CHEW Chew 1-2 tablets by mouth 2 (two) times daily as needed (indigestion).     Cholecalciferol (VITAMIN D3) 50 MCG (2000 UT) TABS Take 2,000 Units by mouth daily.     dicyclomine (BENTYL) 20 MG tablet Take 1 tablet (20 mg total) by mouth every 6 (six) hours as needed for spasms (abdominal cramps, pain). 60 tablet 0   diltiazem (CARDIZEM CD) 360 MG 24 hr capsule Take 1 capsule by mouth at bedtime 90 capsule 0   docusate sodium (COLACE) 100 MG capsule  Take 100 mg by mouth at bedtime. Stool softner     ferrous sulfate 325 (65 FE) MG tablet Take 1 tablet (325 mg total) by mouth daily with breakfast. Lunch and supper (Patient taking differently: Take 325 mg by mouth daily with breakfast.) 30 tablet 3   levothyroxine (SYNTHROID) 100 MCG tablet Take 1 tablet by mouth once daily 90 tablet 0   lovastatin (MEVACOR) 10 MG tablet TAKE 1 TABLET BY MOUTH AT BEDTIME (Patient taking differently: Take 10 mg by mouth once a week.) 90 tablet 0   metoprolol succinate (TOPROL-XL) 25 MG 24 hr tablet Take 1 tablet by mouth once daily 90 tablet 0   omeprazole (PRILOSEC) 40 MG capsule Take 1 capsule (40 mg total) by mouth in the morning and at bedtime. Twice daily for 1 month and then back to once daily. 60 capsule 5   telmisartan (MICARDIS) 40 MG tablet Take 1 tablet by mouth once daily 90 tablet 0   triamterene-hydrochlorothiazide (MAXZIDE-25) 37.5-25 MG tablet Take 1 tablet by mouth once daily 90 tablet 0   vitamin B-12 (CYANOCOBALAMIN) 1000 MCG tablet Take 1,000 mcg by mouth daily.     warfarin (COUMADIN) 5 MG tablet TAKE AS DIRECTED BY ANTICOGULATION CLINIC 105 tablet 0   No current facility-administered medications for this visit.     Objective:  BP 112/64 (BP Location: Left Arm, Patient Position: Sitting, Cuff Size: Normal)    Pulse 67    Temp 98 F (36.7 C) (Temporal)    Ht 5\' 4"  (1.626 m)    Wt 194 lb 3.2 oz (88.1 kg)    LMP  (LMP Unknown)    SpO2 98%    BMI 33.33 kg/m  Gen: NAD, resting comfortably CV: RRR no murmurs rubs or gallops Lungs: CTAB no crackles, wheeze, rhonchi Ext: no edema Skin: warm, dry    Assessment and Plan   #Plantar fasciitis S: Patient reports very painful the last several weeks in her right heel- she states actually even further back started bothering her perhaps august after going to the beach. Went to Dr. Sherryle Lis of podiatry  in September- had a shot in plantar fascia- had mild improvement then worsened again about a month  ago.  Trying to wear good supportive shoes. Has tried to do the exercise.  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 Rush Landmark Carlean Purl. follow chromogranin levels and PET scans. 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  Continue close follow-up with GI and oncology - not yet due for chromogranin A levels  # primary hypercoagulable state with history of multiple DVT and PE #acquired thrombophilia on coumadin S: patient remains on Coumadin long term- at our clinic for pt/inr -requires lovenox bridge for procedures.  -Denies leg swelling or shortness of breath  A/P: Overall this appears stable-no evidence of recurrence of DVT or PE-continue long-term Coumadin  #Atrial fibrillation S: Rate controlled with  diltiazem 360mg  ER, metoprolol 25 mg XR Anticoagulated with  warfarin. Also requires lovenox bridge for procedures A/P: Appropriately anticoagulated and rate controlled-continue current medication  # Iron deficiency anemia- iron levels have been low- currently taking iron daily. Also on high dose PPI through GI.  Last ferritin levels were adequate Lab Results  Component Value Date   IRON 64 02/14/2021   FERRITIN 47.5 02/14/2021   #hypertension S: medication:  diltiazem 360mg  XR, metoprolol 25 mg XR, telmisartan 40mg , triamterene hctz 37.5-25 mg BP Readings from Last 3 Encounters:  09/09/21 112/64  08/27/21 122/68  07/17/21 110/60  A/P:  Controlled. Continue current medications.    #hyperlipidemia- she prefers to stay on lowest dose possible S: Medication: lovastatin 10 mg once a week - still tolerating but gets myalgias Lab  Results  Component Value Date   CHOL 159 08/19/2020   HDL 63 08/19/2020   LDLCALC 78 08/19/2020   LDLDIRECT 39.0 03/10/2019   TRIG 98 08/19/2020   CHOLHDL 2.5 08/19/2020  A/P: LDL reasonably well controlled on lovastatin once a week-she still gets myalgias even on this dose-continue current medication and update full lipid panel  # Diabetes S: Medication: diet controlled. Of note did have steroid shot which could raise #s short term Lab Results  Component Value Date   HGBA1C 6.1 02/14/2021   HGBA1C 6.0 (H) 08/19/2020   HGBA1C 6.3 02/14/2020  A/P: Hopefully stable without medication-update A1c and likely continue without medication  #hypothyroidism S: compliant On thyroid medication- levothyroxine 100 mcg Lab Results  Component Value Date   TSH 1.33 02/14/2021  A/P:  hopefully stable- update TSH today. Continue current meds for now   # B12 deficiency/neuropathy history S: history of neuropathy with this. Metformin likely contributed. Doing well now on oral medication after time on injections.   Lab Results  Component Value Date   VITAMINB12 1,122 (H) 02/14/2021   A/P: Suspect stable-update B12 and continue current medication  #Vitamin D deficiency S: Medication: 2000 units daily Last vitamin D Lab Results  Component Value Date   VD25OH 42.45 02/14/2021  A/P: Has been well controlled-plan to recheck at next visit  - had covid shot October 14th with [employer]  Recommended follow up: Return in about 6 months (around 03/10/2022) for a physical. Future Appointments  Date Time Provider [place]  10/15/2021 10:00 AM [facility]ADIN [facility]IC [facility]  01/08/2022 10:00 AM [facility] PHLEBOTOMIST [facility] None  01/15/2022 11:20 AM Ladell Pier, MD [facility] None   Lab/Order associations:   ICD-10-CM   1. Hyperlipidemia associated with type 2 diabetes mellitus ([place])  E11.69    E78.5     2. Paroxysmal atrial fibrillation ([facility])  I48.0     3. Diabetes mellitus type  2 in obese ([facility])  E11.69 CBC with Differential/Platelet   E66.9 Comprehensive metabolic panel    Lipid panel    Hemoglobin A1c    4. 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 Essential hypertension  I10  6. Neuropathy due to medical condition ([place]) Chronic G63     7. Primary hypercoagulable state ([place]) Chronic D68.59     8. Vitamin D deficiency  E55.9     9. Low vitamin B12 level  E53.8 B12    10. Hypothyroidism, unspecified type  E03.9 TSH      No orders of the defined types were placed in this encounter.  I,Harris Phan,acting as a Education administrator for Garret Reddish, MD.,have documented all relevant documentation on the behalf of Garret Reddish, MD,as directed by  Garret Reddish, MD while in the presence of Garret Reddish, MD.   I, Garret Reddish, MD, have reviewed all documentation for this visit. The documentation on 09/09/21 for the exam, diagnosis, procedures, and orders are all accurate and complete.   Return precautions advised.  Garret Reddish, MD

## 2021-09-11 ENCOUNTER — Ambulatory Visit: Payer: Medicare Other | Admitting: Podiatry

## 2021-09-11 ENCOUNTER — Other Ambulatory Visit: Payer: Self-pay

## 2021-09-11 DIAGNOSIS — M722 Plantar fascial fibromatosis: Secondary | ICD-10-CM

## 2021-09-11 NOTE — Progress Notes (Signed)
°  Subjective:  Patient ID: Erin Kirk, female    DOB: [birth year],  MRN: [id number]  Chief Complaint  Patient presents with   Plantar Fasciitis    Right foot    65 y.o. female presents with the above complaint. History confirmed with patient.  Was doing very well until recently when she began to do more Christmas shopping and thinks this may have flared it  Objective:  Physical Exam: warm, good capillary refill, no trophic changes or ulcerative lesions, normal DP and PT pulses, and normal sensory exam. Left Foot: normal exam, no swelling, tenderness, instability; ligaments intact, full range of motion of all ankle/foot joints Right Foot: Sharp pain on the plantar fascia and calcaneal insertion of the plantar calcaneal tubercle mostly lateral  No images are attached to the encounter.  Radiographs: Multiple views x-ray of the right foot: no fracture, dislocation, swelling or degenerative changes noted and plantar calcaneal spur worse laterally on the oblique view and possible small piece broken off Assessment:   1. Plantar fasciitis of right foot      Plan:  Patient was evaluated and treated and all questions answered.  Discussed the etiology and treatment options for plantar fasciitis including stretching, formal physical therapy, supportive shoegears such as a running shoe or sneaker, pre fabricated orthoses, injection therapy, and oral medications. We also discussed the role of surgical treatment of this for patients who do not improve after exhausting non-surgical treatment options.   -Advised to continue stretching and icing of the affected limb -Repeat injection delivered to the plantar fascia of the right foot. -Discussed with her if not improved in 4 weeks then I would send her a Rx for medrol pack. Educated on use, risks, and benefits of the medication  After sterile prep with povidone-iodine solution and alcohol, the right heel was injected with 0.5cc 2% xylocaine  plain, 0.5cc 0.5% marcaine plain, 5mg  triamcinolone acetonide, and 2mg  dexamethasone was injected along the lateral plantar fascia at the insertion on the plantar calcaneus. The patient tolerated the procedure well without complication.  Return if symptoms worsen or fail to improve.

## 2021-09-16 DIAGNOSIS — Z1231 Encounter for screening mammogram for malignant neoplasm of breast: Secondary | ICD-10-CM | POA: Diagnosis not present

## 2021-09-16 LAB — HM MAMMOGRAPHY

## 2021-09-19 DIAGNOSIS — L905 Scar conditions and fibrosis of skin: Secondary | ICD-10-CM | POA: Diagnosis not present

## 2021-09-19 DIAGNOSIS — D485 Neoplasm of uncertain behavior of skin: Secondary | ICD-10-CM | POA: Diagnosis not present

## 2021-10-15 ENCOUNTER — Ambulatory Visit (INDEPENDENT_AMBULATORY_CARE_PROVIDER_SITE_OTHER): Payer: Medicare Other

## 2021-10-15 ENCOUNTER — Other Ambulatory Visit: Payer: Self-pay

## 2021-10-15 DIAGNOSIS — Z7901 Long term (current) use of anticoagulants: Secondary | ICD-10-CM | POA: Diagnosis not present

## 2021-10-15 LAB — POCT INR: INR: 2.7 (ref 2.0–3.0)

## 2021-10-15 NOTE — Progress Notes (Signed)
I have reviewed and agree with note, evaluation, plan.   Rhodesia Stanger, MD  

## 2021-10-15 NOTE — Progress Notes (Signed)
Continue taking 1 tablet daily except take 1/2 tablet on Mondays. Recheck in 6 wks. 

## 2021-10-15 NOTE — Patient Instructions (Addendum)
Pre visit review using our clinic review tool, if applicable. No additional management support is needed unless otherwise documented below in the visit note.  Continue taking 1 tablet daily except take 1/2 tablet on Mondays. Recheck in 6 wks. 

## 2021-10-26 ENCOUNTER — Other Ambulatory Visit: Payer: Self-pay | Admitting: Family Medicine

## 2021-11-10 ENCOUNTER — Telehealth: Payer: Self-pay | Admitting: Family Medicine

## 2021-11-10 NOTE — Telephone Encounter (Signed)
..  Type of form received: Disability Parking App  Additional comments:   Received by: Adonis Brook  Form should be Faxed to:  Form should be mailed to:    Is patient requesting call for pickup: Yes   Form placed:  In provider's box  Attach charge sheet. yes  Individual made aware of 3-5 business day turn around (Y/N)?

## 2021-11-10 NOTE — Telephone Encounter (Signed)
Will be on the look out for form. °

## 2021-11-16 ENCOUNTER — Other Ambulatory Visit: Payer: Self-pay | Admitting: Family Medicine

## 2021-11-24 ENCOUNTER — Other Ambulatory Visit: Payer: Self-pay | Admitting: Family Medicine

## 2021-11-26 ENCOUNTER — Ambulatory Visit (INDEPENDENT_AMBULATORY_CARE_PROVIDER_SITE_OTHER): Payer: Medicare Other

## 2021-11-26 DIAGNOSIS — Z7901 Long term (current) use of anticoagulants: Secondary | ICD-10-CM | POA: Diagnosis not present

## 2021-11-26 LAB — POCT INR: INR: 3 (ref 2.0–3.0)

## 2021-11-26 NOTE — Progress Notes (Signed)
I have reviewed and agree with note, evaluation, plan.   Danaysha Kirn, MD  

## 2021-11-26 NOTE — Patient Instructions (Addendum)
Pre visit review using our clinic review tool, if applicable. No additional management support is needed unless otherwise documented below in the visit note.  Continue taking 1 tablet daily except take 1/2 tablet on Mondays. Recheck in 6 wks. 

## 2021-11-26 NOTE — Progress Notes (Signed)
Continue taking 1 tablet daily except take 1/2 tablet on Mondays. Recheck in 6 wks. 

## 2021-12-05 ENCOUNTER — Other Ambulatory Visit: Payer: Self-pay | Admitting: Family Medicine

## 2021-12-10 ENCOUNTER — Other Ambulatory Visit: Payer: Self-pay | Admitting: Family Medicine

## 2021-12-17 ENCOUNTER — Telehealth: Payer: Self-pay | Admitting: Family Medicine

## 2021-12-17 ENCOUNTER — Ambulatory Visit (INDEPENDENT_AMBULATORY_CARE_PROVIDER_SITE_OTHER): Payer: Medicare Other | Admitting: Family Medicine

## 2021-12-17 ENCOUNTER — Encounter: Payer: Self-pay | Admitting: Family Medicine

## 2021-12-17 VITALS — BP 120/68 | HR 62 | Temp 98.8°F | Ht 64.0 in | Wt 194.4 lb

## 2021-12-17 DIAGNOSIS — M545 Low back pain, unspecified: Secondary | ICD-10-CM

## 2021-12-17 DIAGNOSIS — R319 Hematuria, unspecified: Secondary | ICD-10-CM | POA: Diagnosis not present

## 2021-12-17 LAB — POCT URINALYSIS DIPSTICK
Bilirubin, UA: NEGATIVE
Blood, UA: NEGATIVE
Glucose, UA: NEGATIVE
Ketones, UA: NEGATIVE
Leukocytes, UA: NEGATIVE
Nitrite, UA: NEGATIVE
Protein, UA: NEGATIVE
Spec Grav, UA: 1.015 (ref 1.010–1.025)
Urobilinogen, UA: 0.2 E.U./dL
pH, UA: 7 (ref 5.0–8.0)

## 2021-12-17 MED ORDER — NITROFURANTOIN MONOHYD MACRO 100 MG PO CAPS: 100.0000 mg | ORAL_CAPSULE | Freq: Two times a day (BID) | ORAL | 0 refills | Status: DC

## 2021-12-17 NOTE — Telephone Encounter (Signed)
Pt has an apt with kulik at 1130am -  ? ?Patient ?Name: ?Erin Kirk HI ?CKS ?Gender: Female ?DOB: [birth year] ?Age: 66 Y 48 M 29 D ?Return ?Phone ?Number: ?[phone] ?(Primary) ?Address: ?City/ ?State/ ?Zip: [place] ? [address] ?Client [facility] - ?Client ?Presenter, [facility] at [facility] ?Provider Garret Reddish- MD ?Contact Type Call ?Who Is Calling Patient / Member / Family / Caregiver ?Call Type Triage / Clinical ?Relationship To Patient Self ?Return Phone Number [phone] (Primary) ?Chief Complaint SEVERE ABDOMINAL PAIN - Severe pain in ?abdomen ?Reason for Call Symptomatic / Request for Health Information ?Initial Comment Caller is calling because she is having severe ?abdominal pain 8 out of 10, pain goes to the side ?and lower back, blood and mucus in the urine. ?Caller was transferred over by the office and the ?patient is needing to schedule an appt to be seen ?today. ?Translation No ?Nurse Assessment ?Nurse: Sheppard Plumber, RN, Estill Bamberg Date/Time (Eastern Time): 12/17/2021 9:04:33 AM ?Confirm and document reason for call. If ?symptomatic, describe symptoms. ?---caller states she has severe pain in lower back and ?wraps around to both sides. started 2 nights ago thru ?the night. last night she voided and when she wiped ?there was a little red and mucus. ?Does the patient have any new or worsening ?symptoms? ---Yes ?Will a triage be completed? ---Yes ?Related visit to physician within the last 2 weeks? ---No ?Does the PT have any chronic conditions? (i.e. ?diabetes, asthma, this includes High risk factors for ?pregnancy, etc.) ?---Yes ?List chronic conditions. ---gastro CA, clotting disorder, HTN ?Is this a behavioral health or substance abuse call? ---No ?Guidelines ?Guideline Title Affirmed Question Affirmed Notes Nurse Date/Time (Eastern ?Time) ?Back Pain [1] SEVERE ?back pain (e.g., ?excruciating, unable ?[place]et, RN, ?Estill Bamberg ?12/17/2021  9:07:03 ?AM ?PLEASE NOTE: All timestamps contained within this report are represented as [place] Standard Time. ?CONFIDENTIALTY NOTICE: This fax transmission is intended only for the addressee. It contains information that is legally privileged, confidential or ?otherwise protected from use or disclosure. If you are not the intended recipient, you are strictly prohibited from reviewing, disclosing, copying using ?or disseminating any of this information or taking any action in reliance on or regarding this information. If you have received this fax in error, please ?notify us immediately by telephone so that we can arrange for its return to [place]. Phone: [phone], Toll-Free: [phone], Fax: [phone] ?Page: 2 of 2 ?Call Id: [id number] ?Guidelines ?Guideline Title Affirmed Question Affirmed Notes Nurse Date/Time (Eastern ?Time) ?to do any normal ?activities) AND [2] ?not improved 2 hours ?after pain medicine ?Disp. Time (Eastern ?Time) Disposition Final User ?12/17/2021 9:03:16 AM Send to Urgent Carlye Grippe, Heriberto [place]ua ?12/17/2021 9:10:18 AM See HCP within 4 Hours (or ?PCP triage) ?Yes [place], RN, Estill Bamberg ?Caller Disagree/Comply Comply ?Caller Understands Yes ?PreDisposition Call Doctor ?Care Advice Given Per Guideline ?SEE HCP (OR PCP TRIAGE) WITHIN 4 HOURS: * IF OFFICE WILL BE OPEN: You need to be seen within the next 3 or 4 ?hours. Call your doctor (or NP/PA) now or as soon as the office opens. CARE ADVICE given per Back Pain (Adult) guideline. * ?You become worse CALL BACK IF: ?Comments ?User: Rozelle Logan, RN Date/Time [name] Time): 12/17/2021 9:13:14 AM ?patient was appointment this morning. if not then was going to ask for appointment tomorrow morning. ?User: Rozelle Logan, RN Date/Time [name] Time): 12/17/2021 9:14:15 AM ?appointment today at 1130 ?Referrals ?REFERRED TO PCP OFFICE ?

## 2021-12-17 NOTE — Telephone Encounter (Signed)
FYI

## 2021-12-17 NOTE — Patient Instructions (Signed)
Macrobid antibiotics ? ?Icey hot, heat, tylenol for pain.   Ice ok ? ?If worse, ER ? ?Otherwise let us know Monday-poss x-ray, etc ?

## 2021-12-17 NOTE — Progress Notes (Signed)
? ?Subjective:  ? ? ? Patient ID: Erin Kirk, female    DOB: [birth year], 66 y.o.   MRN: [id number] ? ?Chief Complaint  ?Patient presents with  ? Back Pain  ?  Lower back pain that started Monday night that radiates to both sides at times, causing difficulty walking   ? Hematuria  ?  Noticed last night after using bathroom, blood was on tissue and discharge looked "slimy"  ? Abdominal Pain  ?  Having pressure as if she need to urinate, but when she go, it's just a little bit  ? ? ?HPI ? LBP (both SI areas)since 3/27 pm.  Rad to both sides.  Pain to walk.  Intermitt but can rad to LLQ-it woke her up w/sharp pain 2 nights ago-heat and motion helped.  Can be RLQ as well. Some better, but still there.  This am, pain moving foot from gas to brake.  Feels better after urination.  Has been more active and taking down wallpaper, etc. No injury. Doing yard work as well.   ?After urination last pm, slimy blood on tissue about 8pm. Doesn't have uterus.  ?Abd pain-pressure as if needs to urinate but only goes a small amt.  When urinates, L side hurts.  No dysuria.  More nocturia.  No f/c/n/v/d/new constipation. ? 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   ? ?Health Maintenance Due  ?Topic Date Due  ? Zoster Vaccines- Shingrix (1 of 2) Never done  ? Pneumonia Vaccine 63+ Years old (2 - PCV) 02/13/2021  ? ? ?Past Medical History:  ?Diagnosis Date  ? Allergy   ? Anxiety   ? Arthritis   ? Asthma   ? many years ago  ? Blood transfusion without reported diagnosis   ? Cataract   ? Chronic kidney disease   ? Clotting disorder ([place])   ? Depression   ? Husband died 06/14/2019  ? Diabetes ([place])   ? not on medication  ? DVT (deep venous thrombosis) ([place])   ? left arm  ? Dysrhythmia   ? Family history of bone cancer   ? Family history of breast cancer   ? Family history of lung cancer   ? Family history of ovarian cancer   ? Family history of testicular cancer   ? Family history of uterine cancer   ? GERD (gastroesophageal reflux disease)   ?  Headache   ? Hypertension   ? Hypothyroidism   ? Iron deficiency anemia 03/30/2019  ? Irregular heart beat   ? tachycardia  ? Lupus anticoagulant with hypercoagulable state ([place])   ? Melanoma ([place])   ? 2005- face   ? Melanoma ([place])   ? face  ? Plantar fasciitis   ? Pneumonia 2019  ? Primary hypercoagulable state ([place])   ? 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 Pulmonary embolism ([place])   ? Thyroid disease   ? hypothyroidism  ? ? ?Past Surgical History:  ?Procedure Laterality Date  ? ABDOMINAL HYSTERECTOMY    ? noncancerous  ? [place]  ? BIOPSY  06/19/2020  ? Procedure: BIOPSY;  Surgeon: Irving Copas., MD;  Location: [facility];  Service: Gastroenterology;;  ? BIOPSY  06/12/2021  ? Procedure: BIOPSY;  Surgeon: Irving Copas., MD;  Location: [place];  Service: Gastroenterology;;  ? CHOLECYSTECTOMY    ? COLONOSCOPY    ? COLONOSCOPY WITH PROPOFOL N/A 06/12/2021  ? Procedure: COLONOSCOPY WITH PROPOFOL;  Surgeon: Rush Landmark Telford Nab., MD;  Location: [facility];  Service: Gastroenterology;  Laterality: N/A;  ? ENDOSCOPIC MUCOSAL RESECTION N/A 07/12/2019  ? Procedure: ENDOSCOPIC MUCOSAL RESECTION;  Surgeon: Rush Landmark Telford Nab., MD;  Location: [place];  Service: Gastroenterology;  Laterality: N/A;  ? ENDOSCOPIC MUCOSAL RESECTION  06/19/2020  ? Procedure: ENDOSCOPIC MUCOSAL RESECTION;  Surgeon: Rush Landmark Telford Nab., MD;  Location: [facility];  Service: Gastroenterology;;  ? ESOPHAGOGASTRODUODENOSCOPY N/A 06/19/2020  ? Procedure: ESOPHAGOGASTRODUODENOSCOPY (EGD);  Surgeon: Irving Copas., MD;  Location: [facility];  Service: Gastroenterology;  Laterality: N/A;  ? ESOPHAGOGASTRODUODENOSCOPY (EGD) WITH PROPOFOL N/A 07/12/2019  ? Procedure: ESOPHAGOGASTRODUODENOSCOPY (EGD) WITH PROPOFOL;  Surgeon: Rush Landmark Telford Nab., MD;  Location: [place];  Service: Gastroenterology;  Laterality: N/A;  ?  ESOPHAGOGASTRODUODENOSCOPY (EGD) WITH PROPOFOL N/A 10/09/2019  ? Procedure: ESOPHAGOGASTRODUODENOSCOPY (EGD) WITH PROPOFOL;  Surgeon: Rush Landmark Telford Nab., MD;  Location: [place];  Service: Gastroenterology;  Laterality: N/A;  ? ESOPHAGOGASTRODUODENOSCOPY (EGD) WITH PROPOFOL N/A 06/12/2021  ? Procedure: ESOPHAGOGASTRODUODENOSCOPY (EGD) WITH PROPOFOL;  Surgeon: Rush Landmark Telford Nab., MD;  Location: [place];  Service: Gastroenterology;  Laterality: N/A;  ? EUS N/A 07/12/2019  ? Procedure: UPPER ENDOSCOPIC ULTRASOUND (EUS) RADIAL;  Surgeon: Rush Landmark Telford Nab., MD;  Location: [place];  Service: Gastroenterology;  Laterality: N/A;  ? EUS N/A 10/09/2019  ? Procedure: UPPER ENDOSCOPIC ULTRASOUND (EUS) RADIAL;  Surgeon: Rush Landmark Telford Nab., MD;  Location: [place];  Service: Gastroenterology;  Laterality: N/A;  ? fatty tumor breast Right   ? removed  ? FINE NEEDLE ASPIRATION  06/19/2020  ? Procedure: FINE NEEDLE ASPIRATION (FNA) LINEAR;  Surgeon: Irving Copas., MD;  Location: [facility];  Service: Gastroenterology;;  ? FOREIGN BODY REMOVAL  10/09/2019  ? Procedure: FOREIGN BODY REMOVAL;  Surgeon: Rush Landmark Telford Nab., MD;  Location: [place];  Service: Gastroenterology;;  ? FOREIGN BODY REMOVAL  06/19/2020  ? Procedure: FOREIGN BODY REMOVAL;  Surgeon: Rush Landmark Telford Nab., MD;  Location: [facility];  Service: Gastroenterology;;  polyp removal with net   ? HEMOSTASIS CLIP PLACEMENT  07/12/2019  ? Procedure: HEMOSTASIS CLIP PLACEMENT;  Surgeon: Irving Copas., MD;  Location: [place];  Service: Gastroenterology;;  ? HEMOSTASIS CLIP PLACEMENT  10/09/2019  ? Procedure: HEMOSTASIS CLIP PLACEMENT;  Surgeon: Irving Copas., MD;  Location: [place];  Service: Gastroenterology;;  ? HEMOSTASIS CLIP PLACEMENT  06/19/2020  ? Procedure: HEMOSTASIS CLIP PLACEMENT;  Surgeon: Irving Copas., MD;  Location: [facility];  Service: Gastroenterology;;  ?  HEMOSTASIS CLIP PLACEMENT  06/12/2021  ? Procedure: HEMOSTASIS CLIP PLACEMENT;  Surgeon: Irving Copas., MD;  Location: [place];  Service: Gastroenterology;;  ? HEMOSTASIS CONTROL  07/12/2019  ? Procedure: HEMOSTASIS CONTROL;  Surgeon: Rush Landmark Telford Nab., MD;  Location: [place];  Service: Gastroenterology;;  ? HYSTEROTOMY    ? KNEE ARTHROSCOPY Left   ? Lymp nodes Right 08/2004  ? MELANOMA EXCISION Right 2005  ? face   ? OOPHORECTOMY Bilateral   ? POLYPECTOMY  07/12/2019  ? Procedure: POLYPECTOMY;  Surgeon: Irving Copas., MD;  Location: [place];  Service: Gastroenterology;;  ? POLYPECTOMY  10/09/2019  ? Procedure: POLYPECTOMY;  Surgeon: Irving Copas., MD;  Location: [place];  Service: Gastroenterology;;  ? POLYPECTOMY  06/19/2020  ? Procedure: POLYPECTOMY;  Surgeon: Irving Copas., MD;  Location: [facility];  Service: Gastroenterology;;  ? POLYPECTOMY  06/12/2021  ? Procedure: POLYPECTOMY;  Surgeon: Mansouraty, Telford Nab., MD;  Location: [place];  Service: Gastroenterology;;  ? removal lymphnodes in right neck    ? SUBMUCOSAL LIFTING  INJECTION  07/12/2019  ? Procedure: SUBMUCOSAL LIFTING INJECTION;  Surgeon: Rush Landmark Telford Nab., MD;  Location: [place];  Service: Gastroenterology;;  ? [place] INJECTION  10/09/2019  ? Procedure: SUBMUCOSAL LIFTING INJECTION;  Surgeon: Rush Landmark Telford Nab., MD;  Location: [place];  Service: Gastroenterology;;  ? [place] INJECTION  06/12/2021  ? Procedure: SUBMUCOSAL LIFTING INJECTION;  Surgeon: Rush Landmark Telford Nab., MD;  Location: [place];  Service: Gastroenterology;;  ? UPPER ESOPHAGEAL ENDOSCOPIC ULTRASOUND (EUS) N/A 06/19/2020  ? Procedure: UPPER ESOPHAGEAL ENDOSCOPIC ULTRASOUND (EUS);  Surgeon: Irving Copas., MD;  Location: [facility];  Service: Gastroenterology;  Laterality: N/A;  ? UPPER GASTROINTESTINAL ENDOSCOPY    ? ? ?Outpatient Medications Prior to Visit   ?Medication Sig Dispense Refill  ? acetaminophen (TYLENOL) 500 MG tablet Take 500 mg by mouth every 6 (six) hours as needed (for pain.).     ? Alum Hydroxide-Mag Trisilicate (GAVISCON) 56-21.3 MG CHEW Chew 1-2 tablets by mouth 2 (t

## 2021-12-17 NOTE — Progress Notes (Signed)
Erin Kirk ?

## 2021-12-18 LAB — URINE CULTURE
MICRO NUMBER:: 13196024
SPECIMEN QUALITY:: ADEQUATE

## 2022-01-07 ENCOUNTER — Ambulatory Visit (INDEPENDENT_AMBULATORY_CARE_PROVIDER_SITE_OTHER): Payer: Medicare Other

## 2022-01-07 DIAGNOSIS — Z7901 Long term (current) use of anticoagulants: Secondary | ICD-10-CM | POA: Diagnosis not present

## 2022-01-07 LAB — POCT INR: INR: 3.1 — AB (ref 2.0–3.0)

## 2022-01-07 NOTE — Patient Instructions (Addendum)
Pre visit review using our clinic review tool, if applicable. No additional management support is needed unless otherwise documented below in the visit note.  Continue taking 1 tablet daily except take 1/2 tablet on Mondays. Recheck in 6 wks. 

## 2022-01-07 NOTE — Progress Notes (Signed)
Continue taking 1 tablet daily except take 1/2 tablet on Mondays. Recheck in 6 wks. 

## 2022-01-08 ENCOUNTER — Inpatient Hospital Stay: Payer: Medicare Other | Attending: Oncology

## 2022-01-08 DIAGNOSIS — Z8049 Family history of malignant neoplasm of other genital organs: Secondary | ICD-10-CM | POA: Insufficient documentation

## 2022-01-08 DIAGNOSIS — E039 Hypothyroidism, unspecified: Secondary | ICD-10-CM | POA: Diagnosis not present

## 2022-01-08 DIAGNOSIS — Z85828 Personal history of other malignant neoplasm of skin: Secondary | ICD-10-CM | POA: Diagnosis not present

## 2022-01-08 DIAGNOSIS — D6861 Antiphospholipid syndrome: Secondary | ICD-10-CM | POA: Insufficient documentation

## 2022-01-08 DIAGNOSIS — R109 Unspecified abdominal pain: Secondary | ICD-10-CM | POA: Insufficient documentation

## 2022-01-08 DIAGNOSIS — D3A092 Benign carcinoid tumor of the stomach: Secondary | ICD-10-CM | POA: Insufficient documentation

## 2022-01-08 DIAGNOSIS — E119 Type 2 diabetes mellitus without complications: Secondary | ICD-10-CM | POA: Diagnosis not present

## 2022-01-08 DIAGNOSIS — Z803 Family history of malignant neoplasm of breast: Secondary | ICD-10-CM | POA: Diagnosis not present

## 2022-01-08 DIAGNOSIS — C7A092 Malignant carcinoid tumor of the stomach: Secondary | ICD-10-CM

## 2022-01-08 DIAGNOSIS — Z7901 Long term (current) use of anticoagulants: Secondary | ICD-10-CM | POA: Insufficient documentation

## 2022-01-09 LAB — CHROMOGRANIN A: Chromogranin A (ng/mL): 763.7 ng/mL — ABNORMAL HIGH (ref 0.0–101.8)

## 2022-01-15 ENCOUNTER — Inpatient Hospital Stay: Payer: Medicare Other | Admitting: Oncology

## 2022-01-15 VITALS — BP 130/80 | HR 73 | Temp 98.2°F | Resp 18 | Ht 64.0 in | Wt 195.2 lb

## 2022-01-15 DIAGNOSIS — E119 Type 2 diabetes mellitus without complications: Secondary | ICD-10-CM | POA: Diagnosis not present

## 2022-01-15 DIAGNOSIS — R109 Unspecified abdominal pain: Secondary | ICD-10-CM | POA: Diagnosis not present

## 2022-01-15 DIAGNOSIS — D6861 Antiphospholipid syndrome: Secondary | ICD-10-CM | POA: Diagnosis not present

## 2022-01-15 DIAGNOSIS — C7A092 Malignant carcinoid tumor of the stomach: Secondary | ICD-10-CM | POA: Diagnosis not present

## 2022-01-15 DIAGNOSIS — Z7901 Long term (current) use of anticoagulants: Secondary | ICD-10-CM | POA: Diagnosis not present

## 2022-01-15 DIAGNOSIS — E039 Hypothyroidism, unspecified: Secondary | ICD-10-CM | POA: Diagnosis not present

## 2022-01-15 DIAGNOSIS — Z85828 Personal history of other malignant neoplasm of skin: Secondary | ICD-10-CM | POA: Diagnosis not present

## 2022-01-15 DIAGNOSIS — D3A092 Benign carcinoid tumor of the stomach: Secondary | ICD-10-CM | POA: Diagnosis not present

## 2022-01-15 DIAGNOSIS — Z803 Family history of malignant neoplasm of breast: Secondary | ICD-10-CM | POA: Diagnosis not present

## 2022-01-15 NOTE — Progress Notes (Deleted)
?[place] ?OFFICE PROGRESS NOTE ? ? ?Diagnosis:  ? ?INTERVAL HISTORY:  ? ?*** ? ?Objective: ? ?Vital signs in last 24 hours: ? ?Blood pressure 130/80, pulse 73, temperature 98.2 ?F (36.8 ?C), resp. rate 18, height '5\' 4"'$  (1.626 m), weight 195 lb 3.2 oz (88.5 kg), SpO2 100 %. ?  ? ?HEENT: *** ?Lymphatics: *** ?Resp: *** ?Cardio: *** ?GI: *** ?Vascular: *** ?Neuro:***  ?Skin:***  ? ?Portacath/PICC-without erythema ? ?Lab Results: ? ?Lab Results  ?Component Value Date  ? WBC 7.2 09/09/2021  ? HGB 13.3 09/09/2021  ? HCT 39.8 09/09/2021  ? MCV 88.9 09/09/2021  ? PLT 193.0 09/09/2021  ? NEUTROABS 4.0 09/09/2021  ? ? ?CMP  ?Lab Results  ?Component Value Date  ? NA 137 09/09/2021  ? K 3.8 09/09/2021  ? CL 99 09/09/2021  ? CO2 29 09/09/2021  ? GLUCOSE 84 09/09/2021  ? BUN 16 09/09/2021  ? CREATININE 1.18 09/09/2021  ? CALCIUM 9.4 09/09/2021  ? PROT 7.0 09/09/2021  ? ALBUMIN 4.5 09/09/2021  ? AST 15 09/09/2021  ? ALT 15 09/09/2021  ? ALKPHOS 60 09/09/2021  ? BILITOT 0.8 09/09/2021  ? GFRNONAA 49 (L) 08/19/2020  ? GFRAA 57 (L) 08/19/2020  ? ? ?No results found for: CEA1, CEA, [id number], CA125 ? ?Lab Results  ?Component Value Date  ? INR 3.1 (A) 01/07/2022  ? LABPROT 27.5 (H) 08/02/2013  ? ? ?Imaging: ? ?No results found. ? ?Medications: I have reviewed the patient's current medications. ? ? ?Assessment/Plan: ?Multiple gastric carcinoid tumors ?Upper endoscopy 05/05/2019 with resection of multiple gastric carcinoid tumors ?EUS 07/12/2019-resection of multiple gastric carcinoid tumors, positive resection margin at the gastric cardia polypectomy site, tubular adenomas and hyperplastic polyps removed from the lesser curvature ?Elevated chromogranin A level ?CT abdomen/pelvis 05/18/2019-no evidence of a gastric mass or metastatic disease ?Netspot 08/11/2019-focal activity corresponding to a 2-3 mm lesion in the mid pancreas, diffuse activity in the stomach ?EGD 10/09/2019- 3 areas of scar/clip/polypoid tissue removed  with negative pathology ?EUS 10/09/2019-4 mm pancreas body lesion, FNA biopsy negative, no adenopathy ?EUS 06/19/2020-no evidence of recurrent tumor in areas of gastric body scars, nodularity was noted underneath 2 endoclips in the greater curvature, this area was biopsied and polyps were removed from the middle gastric body and proximal gastric antrum, biopsies from erosions and erythema were taken at the greater curvature, lesser curvature, and antrum.  An irregular lesion was identified in the pancreas body measuring 3.4 x 4.6 mm, the pancreas lesion was biopsied.  The pathology from the pancreas FNA was negative, the pathology from multiple biopsies including the gastric body scar, antrum polyp, greater curvature biopsy, lesser curvature biopsy, and gastric cardia biopsy revealed low-grade neuroendocrine tumor ?Netspot 04/14/2021-no residual avid tumor in the stomach, no evidence of tracer avid metastatic disease, decrease in tracer activity associated with the pancreas lesion ?Upper endoscopy 922 22-5 gastric polyps resected and retrieved-hyperplastic polyp with well differentiated neuroendocrine tumor, stomach cardia and fundus biopsy-reactive gastropathy with neuroendocrine dysplasia, lesser curvature biopsy-reactive gastropathy with neuroendocrine hyperplasia ?History of iron deficiency anemia secondary bleeding from #1 and maintained on anticoagulation therapy ?Hypercoagulation syndrome, 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   Hypothyroidism ?7.  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  Family history of uterine and breast cancer ?9.  Gastric and colonic adenomas ?Colonoscopy 06/12/2021-polyps removed from the cecum, ascending colon, and descending colon-tubular adenomas and hyperplastic polyp ?10.  Hypodense pancreas lesion 05/18/2019 -positive Netspot uptake 08/11/2019,  negative FNA biopsy ? ? ? ? ?Disposition: ?*** ? ?Betsy Coder,  MD ? ?01/15/2022  ?11:43 AM ? ? ?

## 2022-01-15 NOTE — Progress Notes (Signed)
?[place] ?OFFICE PROGRESS NOTE ? ? ?Diagnosis: Gastric carcinoid tumors ? ?INTERVAL HISTORY:  ? ?Erin Kirk returns as scheduled.  No nausea, abdominal pain, or consistent diarrhea.  She had an episode of left lower back pain last month.  This lasted for approximately 1 week and resolved.  She had 1 episode of blood on toilet tissue after urinating.  She felt she may have had a kidney stone.  No recurrent bleeding. ? ?Objective: ? ?Vital signs in last 24 hours: ? ?Blood pressure 130/80, pulse 73, temperature 98.2 ?F (36.8 ?C), resp. rate 18, height '5\' 4"'$  (1.626 m), weight 195 lb 3.2 oz (88.5 kg), SpO2 100 %. ?  ? ?Lymphatics: No cervical, supraclavicular, axillary, or inguinal nodes ?Resp: Lungs clear bilaterally ?Cardio: Regular rate and rhythm ?GI: No hepatosplenomegaly, no mass, mild tenderness at the bilateral costal margin ?Vascular: No leg edema ? ? ?Lab Results: ? ?Lab Results  ?Component Value Date  ? WBC 7.2 09/09/2021  ? HGB 13.3 09/09/2021  ? HCT 39.8 09/09/2021  ? MCV 88.9 09/09/2021  ? PLT 193.0 09/09/2021  ? NEUTROABS 4.0 09/09/2021  ? ? ?CMP  ?Lab Results  ?Component Value Date  ? NA 137 09/09/2021  ? K 3.8 09/09/2021  ? CL 99 09/09/2021  ? CO2 29 09/09/2021  ? GLUCOSE 84 09/09/2021  ? BUN 16 09/09/2021  ? CREATININE 1.18 09/09/2021  ? CALCIUM 9.4 09/09/2021  ? PROT 7.0 09/09/2021  ? ALBUMIN 4.5 09/09/2021  ? AST 15 09/09/2021  ? ALT 15 09/09/2021  ? ALKPHOS 60 09/09/2021  ? BILITOT 0.8 09/09/2021  ? GFRNONAA 49 (L) 08/19/2020  ? GFRAA 57 (L) 08/19/2020  ? ?Chromogranin A level on 01/08/2022   763.7 ? ?Medications: I have reviewed the patient's current medications. ? ? ?Assessment/Plan: ?Multiple gastric carcinoid tumors ?Upper endoscopy 05/05/2019 with resection of multiple gastric carcinoid tumors ?EUS 07/12/2019-resection of multiple gastric carcinoid tumors, positive resection margin at the gastric cardia polypectomy site, tubular adenomas and hyperplastic polyps removed from the  lesser curvature ?Elevated chromogranin A level ?CT abdomen/pelvis 05/18/2019-no evidence of a gastric mass or metastatic disease ?Netspot 08/11/2019-focal activity corresponding to a 2-3 mm lesion in the mid pancreas, diffuse activity in the stomach ?EGD 10/09/2019- 3 areas of scar/clip/polypoid tissue removed with negative pathology ?EUS 10/09/2019-4 mm pancreas body lesion, FNA biopsy negative, no adenopathy ?EUS 06/19/2020-no evidence of recurrent tumor in areas of gastric body scars, nodularity was noted underneath 2 endoclips in the greater curvature, this area was biopsied and polyps were removed from the middle gastric body and proximal gastric antrum, biopsies from erosions and erythema were taken at the greater curvature, lesser curvature, and antrum.  An irregular lesion was identified in the pancreas body measuring 3.4 x 4.6 mm, the pancreas lesion was biopsied.  The pathology from the pancreas FNA was negative, the pathology from multiple biopsies including the gastric body scar, antrum polyp, greater curvature biopsy, lesser curvature biopsy, and gastric cardia biopsy revealed low-grade neuroendocrine tumor ?Netspot 04/14/2021-no residual avid tumor in the stomach, no evidence of tracer avid metastatic disease, decrease in tracer activity associated with the pancreas lesion ?Upper endoscopy 06/12/2021-5 gastric polyps resected and retrieved-hyperplastic polyp with well differentiated neuroendocrine tumor, stomach cardia and fundus biopsy-reactive gastropathy with neuroendocrine dysplasia, lesser curvature biopsy-reactive gastropathy with neuroendocrine hyperplasia ?History of iron deficiency anemia secondary bleeding from #1 and maintained on anticoagulation therapy ?Hypercoagulation syndrome, 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   Hypothyroidism ?7.  Stage  III melanoma of the right face 2005 ?8.  Family history of uterine and breast  cancer ?9.  Gastric and colonic adenomas ?Colonoscopy 06/12/2021-polyps removed from the cecum, ascending colon, and descending colon-tubular adenomas and hyperplastic polyp ?10.  Hypodense pancreas lesion 05/18/2019 -positive Netspot uptake 08/11/2019, negative FNA biopsy ? ? ? ? ? ?Disposition: ?Erin Kirk appears stable.  She does not appear to have symptoms related to the gastric carcinoid tumors.  She will continue endoscopic surveillance with Dr. Carlean Purl.  The chromogranin a level is stable.  She will return for an office visit and chromogranin A in 6 months.  We will consider obtaining a repeat Netspot in 2024. ? ?Betsy Coder, MD ? ?01/15/2022  ?11:55 AM ? ? ? ?

## 2022-01-23 ENCOUNTER — Other Ambulatory Visit: Payer: Self-pay | Admitting: Family Medicine

## 2022-01-23 ENCOUNTER — Telehealth: Payer: Self-pay

## 2022-01-23 NOTE — Telephone Encounter (Signed)
-----   Message from Gatha Mayer, MD sent at 01/23/2022  8:24 AM EDT ----- ?Regarding: next EGD ?Brad, ? ?We plan repeat EGD around Sept 2024 ? ? ?Remo Lipps, please call her and arrange a late July appointment so we can discuss scheduling of EGD at hospital Fall 2024 ? ?Thanks ? ?Glendell Docker ?----- Message ----- ?From: Ladell Pier, MD ?Sent: 01/15/2022  12:01 PM EDT ?To: Gatha Mayer, MD ? ?I saw her today, she appears stable and the chromogranin a level is unchanged.  I will see her in 6 months ? ?When you plan a follow-up endoscopy?  She would like your office to call her as she does not have MyChart ? ?Thanks, ? ?Brad ? ? ?

## 2022-01-23 NOTE — Telephone Encounter (Signed)
Pt was notified of Dr. Carlean Purl recommendations:  ?Pt was scheduled for 04/09/2022 at 9:50 AM: Pt made aware ?Pt verbalized understanding with all questions answered.  ? ?

## 2022-02-03 ENCOUNTER — Ambulatory Visit (INDEPENDENT_AMBULATORY_CARE_PROVIDER_SITE_OTHER): Payer: Medicare Other

## 2022-02-03 DIAGNOSIS — Z0000 Encounter for general adult medical examination without abnormal findings: Secondary | ICD-10-CM | POA: Diagnosis not present

## 2022-02-03 NOTE — Progress Notes (Signed)
Virtual Visit via Telephone Note ? ?I connected with  Erin Kirk on 02/03/22 at 10:00 AM EDT by telephone and verified that I am speaking with the correct person using two identifiers. ? ?Medicare Annual Wellness visit completed telephonically due to Covid-19 pandemic.  ? ?Persons participating in this call: This Health Coach and this patient.  ? ?Location: ?Patient: home ?Provider: office ?  ?I discussed the limitations, risks, security and privacy concerns of performing an evaluation and management service by telephone and the availability of in person appointments. The patient expressed understanding and agreed to proceed. ? ?Unable to perform video visit due to video visit attempted and failed and/or patient does not have video capability.  ? ?Some vital signs may be absent or patient reported.  ? ?Willette Brace, LPN ? ? ?Subjective:  ? Erin Kirk is a 66 y.o. female who presents for Medicare Annual (Subsequent) preventive examination. ? ?Review of Systems    ? ?Cardiac Risk Factors include: advanced age (>81mn, >>44women);diabetes mellitus;hypertension;dyslipidemia;obesity (BMI >30kg/m2) ? ?   ?Objective:  ?  ?There were no vitals filed for this visit. ?There is no height or weight on file to calculate BMI. ? ? ?  02/03/2022  ? 10:10 AM 06/12/2021  ?  6:46 AM 11/15/2020  ? 10:23 AM 06/19/2020  ?  7:05 AM 11/14/2019  ? 12:10 PM 10/09/2019  ?  8:02 AM 07/12/2019  ?  9:04 AM  ?Advanced Directives  ?Does Patient Have a Medical Advance Directive? Yes Yes Yes No Yes Yes No  ?Type of Advance Directive Living will Living will;Healthcare Power of [place]inegar [place] will  [place]   ?Does patient want to make changes to medical advance directive?   No - Patient declined  No - Patient declined    ?Copy of [place] Chart?  No - copy requested No - copy requested  No - copy requested No - copy  requested   ?Would patient like information on creating a medical advance directive?       No - Patient declined  ? ? ?Current Medications (verified) ?Outpatient Encounter Medications as of 02/03/2022  ?Medication Sig  ? acetaminophen (TYLENOL) 500 MG tablet Take 500 mg by mouth every 6 (six) hours as needed (for pain.).   ? Alum Hydroxide-Mag Trisilicate (GAVISCON) 844-81.8MG CHEW Chew 1-2 tablets by mouth 2 (two) times daily as needed (indigestion).  ? Cholecalciferol (VITAMIN D3) 50 MCG (2000 UT) TABS Take 2,000 Units by mouth daily.  ? diltiazem (CARDIZEM CD) 360 MG 24 hr capsule Take 1 capsule by mouth at bedtime  ? docusate sodium (COLACE) 100 MG capsule Take 100 mg by mouth at bedtime. Stool softner  ? ferrous sulfate 325 (65 FE) MG tablet Take 1 tablet (325 mg total) by mouth daily with breakfast. Lunch and supper (Patient taking differently: Take 325 mg by mouth daily with breakfast.)  ? levothyroxine (SYNTHROID) 100 MCG tablet Take 1 tablet by mouth once daily  ? lovastatin (MEVACOR) 10 MG tablet TAKE 1 TABLET BY MOUTH AT BEDTIME (Patient taking differently: Take 10 mg by mouth once a week.)  ? metoprolol succinate (TOPROL-XL) 25 MG 24 hr tablet Take 1 tablet by mouth once daily  ? telmisartan (MICARDIS) 40 MG tablet Take 1 tablet by mouth once daily  ? triamterene-hydrochlorothiazide (MAXZIDE-25) 37.5-25 MG tablet Take 1 tablet by mouth once daily  ? vitamin B-12 (CYANOCOBALAMIN) 1000  MCG tablet Take 1,000 mcg by mouth daily.  ? warfarin (COUMADIN) 5 MG tablet TAKE AS DIRECTED BY ANTICOGULATION CLINIC  ? dicyclomine (BENTYL) 20 MG tablet Take 1 tablet (20 mg total) by mouth every 6 (six) hours as needed for spasms (abdominal cramps, pain). (Patient not taking: Reported on 02/03/2022)  ? omeprazole (PRILOSEC) 40 MG capsule Take 1 capsule (40 mg total) by mouth in the morning and at bedtime. Twice daily for 1 month and then back to once daily.  ? ?No facility-administered encounter medications on file as of  02/03/2022.  ? ? ?Allergies (verified) ?Guaifenesin & derivatives, Iohexol, Amoxicillin-pot clavulanate, and Other  ? ?History: ?Past Medical History:  ?Diagnosis Date  ? Allergy   ? Anxiety   ? Arthritis   ? Asthma   ? many years ago  ? Blood transfusion without reported diagnosis   ? Cataract   ? Chronic kidney disease   ? Clotting disorder ([place])   ? Depression   ? Husband died 06/24/19  ? Diabetes ([place])   ? not on medication  ? DVT (deep venous thrombosis) ([place])   ? left arm  ? Dysrhythmia   ? Family history of bone cancer   ? Family history of breast cancer   ? Family history of lung cancer   ? Family history of ovarian cancer   ? Family history of testicular cancer   ? Family history of uterine cancer   ? GERD (gastroesophageal reflux disease)   ? Headache   ? Hypertension   ? Hypothyroidism   ? Iron deficiency anemia 03/30/2019  ? Irregular heart beat   ? tachycardia  ? Lupus anticoagulant with hypercoagulable state ([place])   ? Melanoma ([place])   ? 2005- face   ? Melanoma ([place])   ? face  ? Plantar fasciitis   ? Pneumonia 2019  ? Primary hypercoagulable state ([place])   ? 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 Pulmonary embolism ([place])   ? Thyroid disease   ? hypothyroidism  ? ?Past Surgical History:  ?Procedure Laterality Date  ? ABDOMINAL HYSTERECTOMY    ? noncancerous  ? [place]  ? BIOPSY  06/19/2020  ? Procedure: BIOPSY;  Surgeon: Irving Copas., MD;  Location: [facility];  Service: Gastroenterology;;  ? BIOPSY  06/12/2021  ? Procedure: BIOPSY;  Surgeon: Irving Copas., MD;  Location: [place];  Service: Gastroenterology;;  ? CHOLECYSTECTOMY    ? COLONOSCOPY    ? COLONOSCOPY WITH PROPOFOL N/A 06/12/2021  ? Procedure: COLONOSCOPY WITH PROPOFOL;  Surgeon: Mansouraty, Telford Nab., MD;  Location: [place];  Service: Gastroenterology;  Laterality: N/A;  ? ENDOSCOPIC MUCOSAL RESECTION N/A 07/12/2019  ? Procedure: ENDOSCOPIC MUCOSAL  RESECTION;  Surgeon: Rush Landmark Telford Nab., MD;  Location: [place];  Service: Gastroenterology;  Laterality: N/A;  ? ENDOSCOPIC MUCOSAL RESECTION  06/19/2020  ? Procedure: ENDOSCOPIC MUCOSAL RESECTION;  Surgeon: Rush Landmark Telford Nab., MD;  Location: [facility];  Service: Gastroenterology;;  ? ESOPHAGOGASTRODUODENOSCOPY N/A 06/19/2020  ? Procedure: ESOPHAGOGASTRODUODENOSCOPY (EGD);  Surgeon: Irving Copas., MD;  Location: [facility];  Service: Gastroenterology;  Laterality: N/A;  ? ESOPHAGOGASTRODUODENOSCOPY (EGD) WITH PROPOFOL N/A 07/12/2019  ? Procedure: ESOPHAGOGASTRODUODENOSCOPY (EGD) WITH PROPOFOL;  Surgeon: Rush Landmark Telford Nab., MD;  Location: [place];  Service: Gastroenterology;  Laterality: N/A;  ? ESOPHAGOGASTRODUODENOSCOPY (EGD) WITH PROPOFOL N/A 10/09/2019  ? Procedure: ESOPHAGOGASTRODUODENOSCOPY (EGD) WITH PROPOFOL;  Surgeon: Rush Landmark Telford Nab., MD;  Location: [place];  Service: Gastroenterology;  Laterality: N/A;  ?  ESOPHAGOGASTRODUODENOSCOPY (EGD) WITH PROPOFOL N/A 06/12/2021  ? Procedure: ESOPHAGOGASTRODUODENOSCOPY (EGD) WITH PROPOFOL;  Surgeon: Rush Landmark Telford Nab., MD;  Location: [facility];  Service: Gastroenterology;  Laterality: N/A;  ? EUS N/A 07/12/2019  ? Procedure: UPPER ENDOSCOPIC ULTRASOUND (EUS) RADIAL;  Surgeon: Rush Landmark Telford Nab., MD;  Location: [place] [facility];  Service: Gastroenterology;  Laterality: N/A;  ? EUS N/A 10/09/2019  ? Procedure: UPPER ENDOSCOPIC ULTRASOUND (EUS) RADIAL;  Surgeon: Rush Landmark Telford Nab., MD;  Location: [place] [facility];  Service: Gastroenterology;  Laterality: N/A;  ? fatty tumor breast Right   ? removed  ? FINE NEEDLE ASPIRATION  06/19/2020  ? Procedure: FINE NEEDLE ASPIRATION (FNA) LINEAR;  Surgeon: Irving Copas., MD;  Location: [facility];  Service: Gastroenterology;;  ? FOREIGN BODY REMOVAL  10/09/2019  ? Procedure: FOREIGN BODY REMOVAL;  Surgeon: Rush Landmark Telford Nab., MD;  Location: [facility];   Service: Gastroenterology;;  ? FOREIGN BODY REMOVAL  06/19/2020  ? Procedure: FOREIGN BODY REMOVAL;  Surgeon: Rush Landmark Telford Nab., MD;  Location: [facility];  Service: Gastroenterology;;  polyp removal with net

## 2022-02-03 NOTE — Patient Instructions (Signed)
Ms. Onorato , ?Thank you for taking time to come for your Medicare Wellness Visit. I appreciate your ongoing commitment to your health goals. Please review the following plan we discussed and let me know if I can assist you in the future.  ? ?Screening recommendations/referrals: ?Colonoscopy: Done 06/12/21 repeat every 3 years  ?Mammogram: done 09/16/21 repeat every year  ?Bone Density: Done 02/23/20 repeat every 2 years  ?Recommended yearly ophthalmology/optometry visit for glaucoma screening and checkup ?Recommended yearly dental visit for hygiene and checkup ? ?Vaccinations: ?Influenza vaccine: Done 06/17/21 repeat every year ?Pneumococcal vaccine: due ?Tdap vaccine: Done 03/18/18 repeat every 10 years  ?Shingles vaccine: Shingrix discussed. Please contact your pharmacy for coverage information.    ?Covid-19:Completed 1/23, 2/13, 06/28/20 & 8/3, 06/24/21 ? ?Advanced directives: Please bring a copy of your health care power of attorney and living will to the office at your convenience. ? ?Conditions/risks identified: stay healthy and active ? ?Next appointment: Follow up in one year for your annual wellness visit  ? ? ?Preventive Care 68 Years and Older, Female ?Preventive care refers to lifestyle choices and visits with your health care provider that can promote health and wellness. ?What does preventive care include? ?A yearly physical exam. This is also called an annual well check. ?Dental exams once or twice a year. ?Routine eye exams. Ask your health care provider how often you should have your eyes checked. ?Personal lifestyle choices, including: ?Daily care of your teeth and gums. ?Regular physical activity. ?Eating a healthy diet. ?Avoiding tobacco and drug use. ?Limiting alcohol use. ?Practicing safe sex. ?Taking low-dose aspirin every day. ?Taking vitamin and mineral supplements as recommended by your health care provider. ?What happens during an annual well check? ?The services and screenings done by your health  care provider during your annual well check will depend on your age, overall health, lifestyle risk factors, and family history of disease. ?Counseling  ?Your health care provider may ask you questions about your: ?Alcohol use. ?Tobacco use. ?Drug use. ?Emotional well-being. ?Home and relationship well-being. ?Sexual activity. ?Eating habits. ?History of falls. ?Memory and ability to understand (cognition). ?Work and work Statistician. ?Reproductive health. ?Screening  ?You may have the following tests or measurements: ?Height, weight, and BMI. ?Blood pressure. ?Lipid and cholesterol levels. These may be checked every 5 years, or more frequently if you are over 68 years old ?Skin check. ?Lung cancer screening. You may have this screening every year starting at age 68 if you have a 30-pack-year history of smoking and currently smoke or have quit within the past 68 years ?Fecal occult blood test (FOBT) of the stool. You may have this test every year starting at age 68. ?Flexible sigmoidoscopy or colonoscopy. You may have a sigmoidoscopy every 5 years or a colonoscopy every 10 years starting at age 68. ?Hepatitis C blood test. ?Hepatitis B blood test. ?Sexually transmitted disease (STD) testing. ?Diabetes screening. This is done by checking your blood sugar (glucose) after you have not eaten for a while (fasting). You may have this done every 1-3 years. ?Bone density scan. This is done to screen for osteoporosis. You may have this done starting at age 68. ?Mammogram. This may be done every 1-2 years. Talk to your health care provider about how often you should have regular mammograms. ?Talk with your health care provider about your test results, treatment options, and if necessary, the need for more tests. ?Vaccines  ?Your health care provider may recommend certain vaccines, such as: ?Influenza vaccine. This is  recommended every year. ?Tetanus, diphtheria, and acellular pertussis (Tdap, Td) vaccine. You may need a Td  booster every 10 years. ?Zoster vaccine. You may need this after age 68. ?Pneumococcal 13-valent conjugate (PCV13) vaccine. One dose is recommended after age 68. ?Pneumococcal polysaccharide (PPSV23) vaccine. One dose is recommended after age 68. ?Talk to your health care provider about which screenings and vaccines you need and how often you need them. ?This information is not intended to replace advice given to you by your health care provider. Make sure you discuss any questions you have with your health care provider. ?Document Released: 10/04/2015 Document Revised: 05/27/2016 Document Reviewed: 07/09/2015 ?Elsevier Interactive Patient Education ? 2017 [employer]. ? ?Fall Prevention in the Home ?Falls can cause injuries. They can happen to people of all ages. There are many things you can do to make your home safe and to help prevent falls. ?What can I do on the outside of my home? ?Regularly fix the edges of walkways and driveways and fix any cracks. ?Remove anything that might make you trip as you walk through a door, such as a raised step or threshold. ?Trim any bushes or trees on the path to your home. ?Use bright outdoor lighting. ?Clear any walking paths of anything that might make someone trip, such as rocks or tools. ?Regularly check to see if handrails are loose or broken. Make sure that both sides of any steps have handrails. ?Any raised decks and porches should have guardrails on the edges. ?Have any leaves, snow, or ice cleared regularly. ?Use sand or salt on walking paths during winter. ?Clean up any spills in your garage right away. This includes oil or grease spills. ?What can I do in the bathroom? ?Use night lights. ?Install grab bars by the toilet and in the tub and shower. Do not use towel bars as grab bars. ?Use non-skid mats or decals in the tub or shower. ?If you need to sit down in the shower, use a plastic, non-slip stool. ?Keep the floor dry. Clean up any water that spills on the floor  as soon as it happens. ?Remove soap buildup in the tub or shower regularly. ?Attach bath mats securely with double-sided non-slip rug tape. ?Do not have throw rugs and other things on the floor that can make you trip. ?What can I do in the bedroom? ?Use night lights. ?Make sure that you have a light by your bed that is easy to reach. ?Do not use any sheets or blankets that are too big for your bed. They should not hang down onto the floor. ?Have a firm chair that has side arms. You can use this for support while you get dressed. ?Do not have throw rugs and other things on the floor that can make you trip. ?What can I do in the kitchen? ?Clean up any spills right away. ?Avoid walking on wet floors. ?Keep items that you use a lot in easy-to-reach places. ?If you need to reach something above you, use a strong step stool that has a grab bar. ?Keep electrical cords out of the way. ?Do not use floor polish or wax that makes floors slippery. If you must use wax, use non-skid floor wax. ?Do not have throw rugs and other things on the floor that can make you trip. ?What can I do with my stairs? ?Do not leave any items on the stairs. ?Make sure that there are handrails on both sides of the stairs and use them. Fix handrails that are  broken or loose. Make sure that handrails are as long as the stairways. ?Check any carpeting to make sure that it is firmly attached to the stairs. Fix any carpet that is loose or worn. ?Avoid having throw rugs at the top or bottom of the stairs. If you do have throw rugs, attach them to the floor with carpet tape. ?Make sure that you have a light switch at the top of the stairs and the bottom of the stairs. If you do not have them, ask someone to add them for you. ?What else can I do to help prevent falls? ?Wear shoes that: ?Do not have high heels. ?Have rubber bottoms. ?Are comfortable and fit you well. ?Are closed at the toe. Do not wear sandals. ?If you use a stepladder: ?Make sure that it is  fully opened. Do not climb a closed stepladder. ?Make sure that both sides of the stepladder are locked into place. ?Ask someone to hold it for you, if possible. ?Clearly mark and make sure that you

## 2022-02-18 ENCOUNTER — Ambulatory Visit (INDEPENDENT_AMBULATORY_CARE_PROVIDER_SITE_OTHER): Payer: Medicare Other

## 2022-02-18 DIAGNOSIS — Z7901 Long term (current) use of anticoagulants: Secondary | ICD-10-CM

## 2022-02-18 LAB — POCT INR: INR: 2.7 (ref 2.0–3.0)

## 2022-02-18 NOTE — Progress Notes (Signed)
I have reviewed and agree with note, evaluation, plan.   Geana Walts, MD  

## 2022-02-18 NOTE — Progress Notes (Signed)
Continue taking 1 tablet daily except take 1/2 tablet on Mondays. Recheck in 6 wks. 

## 2022-02-18 NOTE — Patient Instructions (Addendum)
Pre visit review using our clinic review tool, if applicable. No additional management support is needed unless otherwise documented below in the visit note.  Continue taking 1 tablet daily except take 1/2 tablet on Mondays. Recheck in 6 wks. 

## 2022-02-22 ENCOUNTER — Other Ambulatory Visit: Payer: Self-pay | Admitting: Family Medicine

## 2022-03-09 ENCOUNTER — Other Ambulatory Visit: Payer: Self-pay | Admitting: Family Medicine

## 2022-03-13 DIAGNOSIS — E119 Type 2 diabetes mellitus without complications: Secondary | ICD-10-CM | POA: Diagnosis not present

## 2022-03-17 ENCOUNTER — Ambulatory Visit (INDEPENDENT_AMBULATORY_CARE_PROVIDER_SITE_OTHER): Payer: Medicare Other | Admitting: Family Medicine

## 2022-03-17 ENCOUNTER — Encounter: Payer: Self-pay | Admitting: Family Medicine

## 2022-03-17 VITALS — BP 110/64 | HR 67 | Temp 98.2°F | Ht 64.0 in | Wt 190.4 lb

## 2022-03-17 DIAGNOSIS — I10 Essential (primary) hypertension: Secondary | ICD-10-CM

## 2022-03-17 DIAGNOSIS — D509 Iron deficiency anemia, unspecified: Secondary | ICD-10-CM | POA: Diagnosis not present

## 2022-03-17 DIAGNOSIS — Z23 Encounter for immunization: Secondary | ICD-10-CM | POA: Diagnosis not present

## 2022-03-17 DIAGNOSIS — E669 Obesity, unspecified: Secondary | ICD-10-CM

## 2022-03-17 DIAGNOSIS — E039 Hypothyroidism, unspecified: Secondary | ICD-10-CM

## 2022-03-17 DIAGNOSIS — I480 Paroxysmal atrial fibrillation: Secondary | ICD-10-CM | POA: Diagnosis not present

## 2022-03-17 DIAGNOSIS — E785 Hyperlipidemia, unspecified: Secondary | ICD-10-CM

## 2022-03-17 DIAGNOSIS — G63 Polyneuropathy in diseases classified elsewhere: Secondary | ICD-10-CM | POA: Diagnosis not present

## 2022-03-17 DIAGNOSIS — E1169 Type 2 diabetes mellitus with other specified complication: Secondary | ICD-10-CM

## 2022-03-17 DIAGNOSIS — Z0000 Encounter for general adult medical examination without abnormal findings: Secondary | ICD-10-CM | POA: Diagnosis not present

## 2022-03-17 DIAGNOSIS — E538 Deficiency of other specified B group vitamins: Secondary | ICD-10-CM | POA: Diagnosis not present

## 2022-03-17 DIAGNOSIS — E559 Vitamin D deficiency, unspecified: Secondary | ICD-10-CM | POA: Diagnosis not present

## 2022-03-17 DIAGNOSIS — N183 Chronic kidney disease, stage 3 unspecified: Secondary | ICD-10-CM

## 2022-03-17 DIAGNOSIS — D6859 Other primary thrombophilia: Secondary | ICD-10-CM

## 2022-03-17 DIAGNOSIS — C7A8 Other malignant neuroendocrine tumors: Secondary | ICD-10-CM | POA: Diagnosis not present

## 2022-03-17 LAB — IBC + FERRITIN
Ferritin: 93.4 ng/mL (ref 10.0–291.0)
Iron: 127 ug/dL (ref 42–145)
Saturation Ratios: 31.5 % (ref 20.0–50.0)
TIBC: 403.2 ug/dL (ref 250.0–450.0)
Transferrin: 288 mg/dL (ref 212.0–360.0)

## 2022-03-17 LAB — CBC WITH DIFFERENTIAL/PLATELET
Basophils Absolute: 0.1 10*3/uL (ref 0.0–0.1)
Basophils Relative: 1.3 % (ref 0.0–3.0)
Eosinophils Absolute: 0.3 10*3/uL (ref 0.0–0.7)
Eosinophils Relative: 5 % (ref 0.0–5.0)
HCT: 40.6 % (ref 36.0–46.0)
Hemoglobin: 13.8 g/dL (ref 12.0–15.0)
Lymphocytes Relative: 26.6 % (ref 12.0–46.0)
Lymphs Abs: 1.5 10*3/uL (ref 0.7–4.0)
MCHC: 34 g/dL (ref 30.0–36.0)
MCV: 89.1 fl (ref 78.0–100.0)
Monocytes Absolute: 0.4 10*3/uL (ref 0.1–1.0)
Monocytes Relative: 7.5 % (ref 3.0–12.0)
Neutro Abs: 3.3 10*3/uL (ref 1.4–7.7)
Neutrophils Relative %: 59.6 % (ref 43.0–77.0)
Platelets: 185 10*3/uL (ref 150.0–400.0)
RBC: 4.55 Mil/uL (ref 3.87–5.11)
RDW: 13.1 % (ref 11.5–15.5)
WBC: 5.5 10*3/uL (ref 4.0–10.5)

## 2022-03-17 LAB — VITAMIN B12: Vitamin B-12: >1504 pg/mL — ABNORMAL HIGH (ref 211–911)

## 2022-03-17 LAB — COMPREHENSIVE METABOLIC PANEL
ALT: 17 U/L (ref 0–35)
AST: 13 U/L (ref 0–37)
Albumin: 4.7 g/dL (ref 3.5–5.2)
Alkaline Phosphatase: 59 U/L (ref 39–117)
BUN: 22 mg/dL (ref 6–23)
CO2: 30 mEq/L (ref 19–32)
Calcium: 9.7 mg/dL (ref 8.4–10.5)
Chloride: 100 mEq/L (ref 96–112)
Creatinine, Ser: 1.21 mg/dL — ABNORMAL HIGH (ref 0.40–1.20)
GFR: 46.19 mL/min — ABNORMAL LOW (ref >60.00)
Glucose, Bld: 117 mg/dL — ABNORMAL HIGH (ref 70–99)
Potassium: 4 mEq/L (ref 3.5–5.1)
Sodium: 137 mEq/L (ref 135–145)
Total Bilirubin: 0.9 mg/dL (ref 0.2–1.2)
Total Protein: 7.2 g/dL (ref 6.0–8.3)

## 2022-03-17 LAB — VITAMIN D 25 HYDROXY (VIT D DEFICIENCY, FRACTURES): VITD: 66.68 ng/mL (ref 30.00–100.00)

## 2022-03-17 LAB — LIPID PANEL
Cholesterol: 180 mg/dL (ref 0–200)
HDL: 57.8 mg/dL (ref >39.00)
LDL Cholesterol: 94 mg/dL (ref 0–99)
NonHDL: 121.84
Total CHOL/HDL Ratio: 3
Triglycerides: 139 mg/dL (ref 0.0–149.0)
VLDL: 27.8 mg/dL (ref 0.0–40.0)

## 2022-03-17 LAB — MICROALBUMIN / CREATININE URINE RATIO
Creatinine,U: 104.6 mg/dL
Microalb Creat Ratio: 0.8 mg/g (ref 0.0–30.0)
Microalb, Ur: 0.8 mg/dL (ref 0.0–1.9)

## 2022-03-17 LAB — TSH: TSH: 2.77 u[IU]/mL (ref 0.35–5.50)

## 2022-03-17 LAB — HEMOGLOBIN A1C: Hgb A1c MFr Bld: 6.2 % (ref 4.6–6.5)

## 2022-03-17 NOTE — Progress Notes (Signed)
Phone [phone]   Subjective:  Patient presents today for their annual physical. Chief complaint-noted.   See problem oriented charting- ROS- full  review of systems was completed and negative except for: diarrhea vomiting first weekend in June- has recovered fully- thinks had food poisoning after eating at [employer]  The following were reviewed and entered/updated in epic: Past Medical History:  Diagnosis Date   Allergy    Anxiety    Arthritis    Asthma    many years ago   Blood transfusion without reported diagnosis    Cataract    Chronic kidney disease    Clotting disorder (HCC)    Depression    Husband died 06/24/2019   Diabetes (HCC)    not on medication   DVT (deep venous thrombosis) (HCC)    left arm   Dysrhythmia    Family history of bone cancer    Family history of breast cancer    Family history of lung cancer    Family history of ovarian cancer    Family history of testicular cancer    Family history of uterine cancer    GERD (gastroesophageal reflux disease)    Headache    Hypertension    Hypothyroidism    Iron deficiency anemia 03/30/2019   Irregular heart beat    tachycardia   Lupus anticoagulant with hypercoagulable state (HCC)    Melanoma (HCC)    2005- face    Melanoma (HCC)    face   Plantar fasciitis    Pneumonia 2019   Primary hypercoagulable state (HCC)    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   Pulmonary embolism (HCC)    Thyroid disease    hypothyroidism   Patient Active Problem List   Diagnosis Date Noted   Neuroendocrine carcinoma of stomach (HCC) 07/12/2019    Priority: High   Elevated Chromogranin 07/02/2019    Priority: High   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) 05/12/2019    Priority: High   Low vitamin B12 level 09/07/2016    Priority: High   Disability examination 01/17/2015    Priority: High   Diabetes mellitus type 2 in obese (HCC) 11/28/2010     Priority: High   PAROXYSMAL ATRIAL FIBRILLATION 07/02/2010    Priority: High   Primary hypercoagulable state (HCC) 11/30/2007    Priority: High   Osteopenia 02/26/2020    Priority: Medium    Vitamin D deficiency 07/20/2019    Priority: Medium    Iron deficiency anemia 03/30/2019    Priority: Medium    Hyperlipidemia associated with type 2 diabetes mellitus (HCC) 03/09/2019    Priority: Medium    Cough variant asthma 10/14/2018    Priority: Medium    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) 11/09/2017    Priority: Medium    Hx of adenomatous polyp of colon 04/10/2016    Priority: Medium    CKD (chronic kidney disease), stage III (HCC) 09/05/2014    Priority: Medium    Hypothyroidism 03/09/2007    Priority: Medium    Essential hypertension 03/09/2007    Priority: Medium    History of Melanoma 03/09/2007    Priority: Medium    Abnormal findings on esophagogastroduodenoscopy (EGD) 07/02/2019    Priority: Low   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    Priority: Low   Multiple gastric polyps 07/02/2019    Priority: Low   Long term (current) use of anticoagulants - warfarin 06/02/2017    Priority: Low  Encounter for therapeutic drug monitoring 10/26/2013    Priority: Low   Left facial pain 09/24/2012    Priority: Low   Oral mucosal lesion 09/24/2012    Priority: Low   TRANSIENT DISORDER INITIATING/MAINTAINING SLEEP 04/02/2010    Priority: Low   Overweight(278.02) 11/29/2009    Priority: Low   LOW BACK PAIN 06/15/2008    Priority: Low   ALLERGIC RHINITIS 03/09/2007    Priority: Low   GERD 03/09/2007    Priority: Low   Pancreatic lesion 04/21/2020   Abnormal finding on radiology exam 04/21/2020   Intestinal metaplasia of gastric mucosa 04/21/2020   Acquired thrombophilia (HCC) 02/14/2020   Myalgia due to statin 02/14/2020   Genetic testing 08/15/2019   Family history of breast cancer    Family history of ovarian cancer    Family history of uterine cancer    Family  history of lung cancer    Family history of bone cancer    Family history of testicular cancer    Past Surgical History:  Procedure Laterality Date   ABDOMINAL HYSTERECTOMY     noncancerous   BACK SURGERY  1983   BIOPSY  06/19/2020   Procedure: BIOPSY;  Surgeon: Lemar Lofty., MD;  Location: [facility];  Service: Gastroenterology;;   BIOPSY  06/12/2021   Procedure: BIOPSY;  Surgeon: Lemar Lofty., MD;  Location: [facility] ENDOSCOPY;  Service: Gastroenterology;;   CHOLECYSTECTOMY     COLONOSCOPY     COLONOSCOPY WITH PROPOFOL N/A 06/12/2021   Procedure: COLONOSCOPY WITH PROPOFOL;  Surgeon: Lemar Lofty., MD;  Location: [facility] ENDOSCOPY;  Service: Gastroenterology;  Laterality: N/A;   ENDOSCOPIC MUCOSAL RESECTION N/A 07/12/2019   Procedure: ENDOSCOPIC MUCOSAL RESECTION;  Surgeon: Meridee Score Netty Starring., MD;  Location: [facility] ENDOSCOPY;  Service: Gastroenterology;  Laterality: N/A;   ENDOSCOPIC MUCOSAL RESECTION  06/19/2020   Procedure: ENDOSCOPIC MUCOSAL RESECTION;  Surgeon: Meridee Score Netty Starring., MD;  Location: [facility] ENDOSCOPY;  Service: Gastroenterology;;   ESOPHAGOGASTRODUODENOSCOPY N/A 06/19/2020   Procedure: ESOPHAGOGASTRODUODENOSCOPY (EGD);  Surgeon: Lemar Lofty., MD;  Location: [facility]COPY;  Service: Gastroenterology;  Laterality: N/A;   ESOPHAGOGASTRODUODENOSCOPY (EGD) WITH PROPOFOL N/A 07/12/2019   Procedure: ESOPHAGOGASTRODUODENOSCOPY (EGD) WITH PROPOFOL;  Surgeon: Meridee Score Netty Starring., MD;  Location: [facility] ENDOSCOPY;  Service: Gastroenterology;  Laterality: N/A;   ESOPHAGOGASTRODUODENOSCOPY (EGD) WITH PROPOFOL N/A 10/09/2019   Procedure: ESOPHAGOGASTRODUODENOSCOPY (EGD) WITH PROPOFOL;  Surgeon: Meridee Score Netty Starring., MD;  Location: [facility] ENDOSCOPY;  Service: Gastroenterology;  Laterality: N/A;   ESOPHAGOGASTRODUODENOSCOPY (EGD) WITH PROPOFOL N/A 06/12/2021   Procedure: ESOPHAGOGASTRODUODENOSCOPY (EGD) WITH PROPOFOL;  Surgeon: Meridee Score Netty Starring., MD;   Location: [facility]COPY;  Service: Gastroenterology;  Laterality: N/A;   EUS N/A 07/12/2019   Procedure: UPPER ENDOSCOPIC ULTRASOUND (EUS) RADIAL;  Surgeon: Lemar Lofty., MD;  Location: [facility] ENDOSCOPY;  Service: Gastroenterology;  Laterality: N/A;   EUS N/A 10/09/2019   Procedure: UPPER ENDOSCOPIC ULTRASOUND (EUS) RADIAL;  Surgeon: Lemar Lofty., MD;  Location: [facility] ENDOSCOPY;  Service: Gastroenterology;  Laterality: N/A;   fatty tumor breast Right    removed   FINE NEEDLE ASPIRATION  06/19/2020   Procedure: FINE NEEDLE ASPIRATION (FNA) LINEAR;  Surgeon: Lemar Lofty., MD;  Location: [facility]OSCOPY;  Service: Gastroenterology;;   FOREIGN BODY REMOVAL  10/09/2019   Procedure: FOREIGN BODY REMOVAL;  Surgeon: Lemar Lofty., MD;  Location: [facility] ENDOSCOPY;  Service: Gastroenterology;;   FOREIGN BODY REMOVAL  06/19/2020   Procedure: FOREIGN BODY REMOVAL;  Surgeon: Lemar Lofty., MD;  Location: [facility] ENDOSCOPY;  Service: Gastroenterology;;  polyp removal with net    HEMOSTASIS CLIP PLACEMENT  07/12/2019   Procedure: HEMOSTASIS CLIP PLACEMENT;  Surgeon: Lemar Lofty., MD;  Location: [facility] ENDOSCOPY;  Service: Gastroenterology;;   HEMOSTASIS CLIP PLACEMENT  10/09/2019   Procedure: HEMOSTASIS CLIP PLACEMENT;  Surgeon: Lemar Lofty., MD;  Location: [facility] ENDOSCOPY;  Service: Gastroenterology;;   HEMOSTASIS CLIP PLACEMENT  06/19/2020   Procedure: HEMOSTASIS CLIP PLACEMENT;  Surgeon: Lemar Lofty., MD;  Location: [facility] ENDOSCOPY;  Service: Gastroenterology;;   HEMOSTASIS CLIP PLACEMENT  06/12/2021   Procedure: HEMOSTASIS CLIP PLACEMENT;  Surgeon: Lemar Lofty., MD;  Location: [facility] ENDOSCOPY;  Service: Gastroenterology;;   HEMOSTASIS CONTROL  07/12/2019   Procedure: HEMOSTASIS CONTROL;  Surgeon: Lemar Lofty., MD;  Location: [facility] ENDOSCOPY;  Service: Gastroenterology;;   HYSTEROTOMY     KNEE ARTHROSCOPY Left    Lymp nodes Right 08/2004    MELANOMA EXCISION Right 2005   face    OOPHORECTOMY Bilateral    POLYPECTOMY  07/12/2019   Procedure: POLYPECTOMY;  Surgeon: Lemar Lofty., MD;  Location: [facility] ENDOSCOPY;  Service: Gastroenterology;;   POLYPECTOMY  10/09/2019   Procedure: POLYPECTOMY;  Surgeon: Lemar Lofty., MD;  Location: [facility] ENDOSCOPY;  Service: Gastroenterology;;   POLYPECTOMY  06/19/2020   Procedure: POLYPECTOMY;  Surgeon: Lemar Lofty., MD;  Location: [facility] ENDOSCOPY;  Service: Gastroenterology;;   POLYPECTOMY  06/12/2021   Procedure: POLYPECTOMY;  Surgeon: Lemar Lofty., MD;  Location: [facility] ENDOSCOPY;  Service: Gastroenterology;;   removal lymphnodes in right neck     SUBMUCOSAL LIFTING INJECTION  07/12/2019   Procedure: SUBMUCOSAL LIFTING INJECTION;  Surgeon: Lemar Lofty., MD;  Location: [facility] ENDOSCOPY;  Service: Gastroenterology;;   SUBMUCOSAL LIFTING INJECTION  10/09/2019   Procedure: SUBMUCOSAL LIFTING INJECTION;  Surgeon: Lemar Lofty., MD;  Location: [facility] ENDOSCOPY;  Service: Gastroenterology;;   Brooke Dare INJECTION  06/12/2021   Procedure: SUBMUCOSAL LIFTING INJECTION;  Surgeon: Lemar Lofty., MD;  Location: [facility] ENDOSCOPY;  Service: Gastroenterology;;   UPPER ESOPHAGEAL ENDOSCOPIC ULTRASOUND (EUS) N/A 06/19/2020   Procedure: UPPER ESOPHAGEAL ENDOSCOPIC ULTRASOUND (EUS);  Surgeon: Lemar Lofty., MD;  Location: [facility] ENDOSCOPY;  Service: Gastroenterology;  Laterality: N/A;   UPPER GASTROINTESTINAL ENDOSCOPY      Family History  Problem Relation Age of Onset   Coronary artery disease Mother        Mom 57, Dad 57-smoker   Diabetes Mother    Ovarian cancer Mother 19       not officially diagnosed   Hypertension Father    Diabetes Father    Mitral valve prolapse Sister    Diabetes Sister    Breast cancer Maternal Aunt 73   Breast cancer Half-Sister 69   Breast cancer Half-Sister 80   Uterine cancer Niece 75   Breast cancer Niece         diagnosed mid-60s   Lung cancer Nephew    Bone cancer Nephew 73   Cancer Niece 27       unknown type   Alzheimer's disease Maternal Aunt    Breast cancer Other        maternal great-aunt, diagnosed in her 76s   Breast cancer Other        4th degree maternal relative, diagnosed in her 47s   Colon cancer Neg Hx    Esophageal cancer Neg Hx    Pancreatic cancer Neg Hx    Rectal cancer Neg Hx    Stomach cancer Neg Hx  Inflammatory bowel disease Neg Hx    Liver disease Neg Hx     Medications- reviewed and updated Current Outpatient Medications  Medication Sig Dispense Refill   acetaminophen (TYLENOL) 500 MG tablet Take 500 mg by mouth every 6 (six) hours as needed (for pain.).      Alum Hydroxide-Mag Trisilicate (GAVISCON) 80-14.2 MG CHEW Chew 1-2 tablets by mouth 2 (two) times daily as needed (indigestion).     Cholecalciferol (VITAMIN D3) 50 MCG (2000 UT) TABS Take 2,000 Units by mouth daily.     dicyclomine (BENTYL) 20 MG tablet Take 1 tablet (20 mg total) by mouth every 6 (six) hours as needed for spasms (abdominal cramps, pain). 60 tablet 0   diltiazem (CARDIZEM CD) 360 MG 24 hr capsule Take 1 capsule by mouth at bedtime 90 capsule 0   docusate sodium (COLACE) 100 MG capsule Take 100 mg by mouth at bedtime. Stool softner     ferrous sulfate 325 (65 FE) MG tablet Take 1 tablet (325 mg total) by mouth daily with breakfast. Lunch and supper (Patient taking differently: Take 325 mg by mouth daily with breakfast.) 30 tablet 3   levothyroxine (SYNTHROID) 100 MCG tablet Take 1 tablet by mouth once daily 90 tablet 0   lovastatin (MEVACOR) 10 MG tablet TAKE 1 TABLET BY MOUTH AT BEDTIME (Patient taking differently: Take 10 mg by mouth once a week.) 90 tablet 0   metoprolol succinate (TOPROL-XL) 25 MG 24 hr tablet Take 1 tablet by mouth once daily 90 tablet 0   telmisartan (MICARDIS) 40 MG tablet Take 1 tablet by mouth once daily 90 tablet 0   triamterene-hydrochlorothiazide (MAXZIDE-25)  37.5-25 MG tablet Take 1 tablet by mouth once daily 90 tablet 0   vitamin B-12 (CYANOCOBALAMIN) 1000 MCG tablet Take 1,000 mcg by mouth daily.     warfarin (COUMADIN) 5 MG tablet TAKE AS DIRECTED BY  ANTICOGULATION  CLINIC 105 tablet 0   omeprazole (PRILOSEC) 40 MG capsule Take 1 capsule (40 mg total) by mouth in the morning and at bedtime. Twice daily for 1 month and then back to once daily. 60 capsule 5   No current facility-administered medications for this visit.    Allergies-reviewed and updated Allergies  Allergen Reactions   Guaifenesin & Derivatives Nausea And Vomiting    Hallucinations   Iohexol Anaphylaxis, Hives and Shortness Of Breath    Incident 1990, premedicated prior to obtaining IV contrast since.   Amoxicillin-Pot Clavulanate Swelling and Other (See Comments)    Facial swelling Did it involve swelling of the face/tongue/throat, SOB, or low BP? Yes Did it involve sudden or severe rash/hives, skin peeling, or any reaction on the inside of your mouth or nose? No Did you need to seek medical attention at a hospital or doctor's office? No When did it last happen?~7-8 years ago   If all above answers are "NO", may proceed with cephalosporin use.    Other     Contrast dye    Social History   Social History Narrative   Married 1971. 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    Objective  Objective:  BP 110/64   Pulse 67   Temp 98.2 F (36.8 C)   Ht 5\' 4"  (1.626 m)   Wt 190  lb 6.4 oz (86.4 kg)   LMP  (LMP Unknown)   SpO2 100%   BMI 32.68 kg/m  Gen: NAD, resting comfortably HEENT: Mucous membranes are moist. Oropharynx normal Neck: no thyromegaly CV: RRR no murmurs rubs or gallops Lungs: CTAB no crackles, wheeze, rhonchi Abdomen: soft/nontender/nondistended/normal bowel  sounds. No rebound or guarding.  Ext: no edema Skin: warm, dry Neuro: grossly normal, moves all extremities, PERRLA  Diabetic Foot Exam - Simple   Simple Foot Form Diabetic Foot exam was performed with the following findings: Yes 03/17/2022 10:00 AM  Visual Inspection No deformities, no ulcerations, no other skin breakdown bilaterally: Yes Sensation Testing Intact to touch and monofilament testing bilaterally: Yes Pulse Check Posterior Tibialis and Dorsalis pulse intact bilaterally: Yes Comments Bunions noted- worse on left foot      Assessment and Plan   66 y.o. female presenting for annual physical.  Health Maintenance counseling: 1. Anticipatory guidance: Patient counseled regarding regular dental exams - q6 months, eye exams - yearly- we are waiting on records,  avoiding smoking and second hand smoke , limiting alcohol to 1 beverage per day- doesn't drink , no illicit drugs .   2. Risk factor reduction:  Advised patient of need for regular exercise and diet rich and fruits and vegetables to reduce risk of heart attack and stroke.  Exercise- recumbent bike  1-1.5 hours a day.  Diet/weight management-still deals with some stress/grief eating after loss of husband- thinks could increase protein to help with satiety.  Wt Readings from Last 3 Encounters:  03/17/22 190 lb 6.4 oz (86.4 kg)  01/15/22 195 lb 3.2 oz (88.5 kg)  12/17/21 194 lb 6 oz (88.2 kg)  3. Immunizations/screenings/ancillary studies- prevnar 20 today, will send dates of first shingrix Immunization History  Administered Date(s) Administered   Influenza Split 06/30/2011, 06/29/2012   Influenza Whole 07/26/2007, 06/15/2008, 07/25/2009   Influenza, High Dose Seasonal PF 06/23/2019   Influenza,inj,Quad PF,6+ Mos 07/10/2013, 06/13/2014, 06/14/2015, 05/14/2016, 05/27/2017, 07/19/2018   Influenza-Unspecified 05/23/2020, 06/17/2021   PFIZER Comirnaty(Gray Top)Covid-19 Tri-Sucrose Vaccine 04/23/2021   PFIZER(Purple  Top)SARS-COV-2 Vaccination 10/14/2019, 11/04/2019, 06/28/2020   Pfizer Covid-19 Vaccine Bivalent Booster 62yrs & up 07/04/2021   Pneumococcal Polysaccharide-23 07/26/2014, 02/14/2020   Td 09/22/2003   Tdap 03/18/2018  4. Cervical cancer screening- still sees Dr. Billy Coast- seen in december 5. Breast cancer screening-  breast exam with DR. Taavon and mammogram 09/16/21 6. Colon cancer screening - 06/12/21 - gets regularly with DR. 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 Skin cancer screening- Dr. Doreen Beam [place] derm in decemberadvised regular sunscreen use. Denies worrisome, changing, or new skin lesions.  8. Birth control/STD check- not sexually active 9. Osteoporosis screening at 65- mild osteopenia- prefers to wait until next year for next bone density 10. 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 Waynard Edwards. follow chromogranin levels and PET scans.  -has been stable/reassuring results  # primary hypercoagulable state with history of multiple DVT and PE #acquired thrombophilia on coumadin S: patient remains on  Coumadin long term -requires lovenox bridge for procedures.  A/P: Controlled without recurrence Continue current medications.     # Atrial fibrillation S: Rate controlled with  diltiazem 360mg  ER and metoprolol Anticoagulated with  warfarin. Also requires lovenox bridge for procedures A/P: appropriately anticoagulated and rate controlled-continue current medication  # Iron deficiency anemia- iron levels have been low-  currently taking iron. Also on high dose PPI through GI.  Lab Results  Component Value Date   FERRITIN 47.5 02/14/2021   #hypertension S: medication:  diltiazem 360mg  XR, metoprolol 25 mg XR, telmisartan 40mg , triamterene hctz 37.5-25 mg A/P: Controlled. Continue current medications.    #hyperlipidemia- she prefers to stay on lowest dose possible S: Medication: lovastatin 10 mg once a week Lab Results  Component Value Date   CHOL 156 09/09/2021   HDL 49.40 09/09/2021   LDLCALC 86 09/09/2021   LDLDIRECT 39.0 03/10/2019   TRIG 104.0 09/09/2021   CHOLHDL 3 09/09/2021   A/P: reasonable control- continue current meds  # Diabetes S: Medication: diet controlled- some increased sweets lately with birthdays  A/P: hopefully stable- update a1c today. Continue without  meds for now  #hypothyroidism S: compliant On thyroid medication- levothyroxine 100 mcg A/P:hopefully stable- update tsh today. Continue current meds for now   # B12 deficiency S: history of neuropathy with this. Metformin likely contributed. Doing well now on oral medication after time on injectoins.   A/P: hopefully stable- update b12 today. Continue current meds for now   #Vitamin D deficiency S: Medication: 2000 units daily A/P:  hopefully stable- update vitamin D today. Continue current meds for now   Recommended follow up: Return in about 6 months (around 09/16/2022) for followup or sooner if needed.Schedule b4 you leave. Future Appointments  Date Time Provider Department Center  04/01/2022 10:00 AM [facility] COUMADIN CLINIC [facility]  04/09/2022  9:50 AM Iva Boop, MD [facility]  07/10/2022 11:00 AM [facility] PHLEBOTOMIST [facility] None  07/17/2022 11:20 AM Ladene Artist, MD [facility] None  02/11/2023 10:00 AM [facility] HEALTH COACH [facility]   Lab/Order associations:NOT fasting   ICD-10-CM   1. Preventative health care  Z00.00     2. Neuroendocrine carcinoma of stomach (HCC)  C7A.8     3. Diabetes mellitus type 2 in obese (HCC)  E11.69    E66.9     4. Paroxysmal atrial fibrillation (HCC)  I48.0     5. Primary hypercoagulable state (HCC)  D68.59     6. Vitamin D deficiency  E55.9     7. Hyperlipidemia associated with type 2 diabetes mellitus (HCC)  E11.69    E78.5      8. 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 Stage 3 chronic kidney disease, unspecified whether stage 3a or 3b CKD (HCC)  N18.30     10. Essential hypertension  I10     11. Hypothyroidism, unspecified type  E03.9     12. Low vitamin B12 level  E53.8     13. Iron deficiency anemia, unspecified iron deficiency anemia type  D50.9       No orders of the defined types were placed in this encounter.   Return precautions advised.  Tana Conch, MD

## 2022-04-01 ENCOUNTER — Ambulatory Visit (INDEPENDENT_AMBULATORY_CARE_PROVIDER_SITE_OTHER): Payer: Medicare Other

## 2022-04-01 DIAGNOSIS — Z7901 Long term (current) use of anticoagulants: Secondary | ICD-10-CM | POA: Diagnosis not present

## 2022-04-01 LAB — POCT INR: INR: 3.1 — AB (ref 2.0–3.0)

## 2022-04-01 NOTE — Progress Notes (Signed)
Continue taking 1 tablet daily except take 1/2 tablet on Mondays. Recheck in 6 wks.

## 2022-04-01 NOTE — Patient Instructions (Addendum)
Pre visit review using our clinic review tool, if applicable. No additional management support is needed unless otherwise documented below in the visit note.  Continue taking 1 tablet daily except take 1/2 tablet on Mondays. Recheck in 6 wks.

## 2022-04-09 ENCOUNTER — Telehealth: Payer: Self-pay

## 2022-04-09 ENCOUNTER — Encounter: Payer: Self-pay | Admitting: Internal Medicine

## 2022-04-09 ENCOUNTER — Ambulatory Visit: Payer: Medicare Other | Admitting: Internal Medicine

## 2022-04-09 VITALS — BP 90/60 | HR 93 | Ht 64.5 in | Wt 193.0 lb

## 2022-04-09 DIAGNOSIS — Z86010 Personal history of colonic polyps: Secondary | ICD-10-CM | POA: Diagnosis not present

## 2022-04-09 DIAGNOSIS — D131 Benign neoplasm of stomach: Secondary | ICD-10-CM | POA: Diagnosis not present

## 2022-04-09 DIAGNOSIS — D6861 Antiphospholipid syndrome: Secondary | ICD-10-CM | POA: Diagnosis not present

## 2022-04-09 DIAGNOSIS — Z7901 Long term (current) use of anticoagulants: Secondary | ICD-10-CM | POA: Diagnosis not present

## 2022-04-09 DIAGNOSIS — K317 Polyp of stomach and duodenum: Secondary | ICD-10-CM

## 2022-04-09 DIAGNOSIS — C7A8 Other malignant neuroendocrine tumors: Secondary | ICD-10-CM

## 2022-04-09 NOTE — Patient Instructions (Signed)
You have been scheduled for an endoscopy. Please follow written instructions given to you at your visit today. If you use inhalers (even only as needed), please bring them with you on the day of your procedure.  You will be contaced by our office prior to your procedure for directions on holding your Coumadin/Warfarin.  If you do not hear from our office 1 week prior to your scheduled procedure, please call [phone] to discuss.  I appreciate the opportunity to care for you. Silvano Rusk, MD, [facility]

## 2022-04-09 NOTE — Telephone Encounter (Signed)
Anti-Coag letter faxed to Dr Garret Reddish her PCP for advice.

## 2022-04-09 NOTE — Progress Notes (Signed)
Erin Kirk 66 y.o. 1956-06-21 [id number]  Assessment & Plan:   Encounter Diagnoses  Name Primary?   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  Scheduling for May 28, 2022.  [place] [facility].  Think best to be done at the hospital given likelihood of polypectomy and need for hospital only equipment, possibly.   We will need to hold warfarin 5 days prior and in the past she has done a Lovenox bridge.  Coordinate with primary care provider.        Multiple gastric carcinoid tumors Upper endoscopy 05/05/2019 with resection of multiple gastric carcinoid tumors EUS 07/12/2019-resection of multiple gastric carcinoid tumors, positive resection margin at the gastric cardia polypectomy site, tubular adenomas and hyperplastic polyps removed from the lesser curvature Elevated chromogranin A level CT abdomen/pelvis 05/18/2019-no evidence of a gastric mass or metastatic disease Netspot 08/11/2019-focal activity corresponding to a 2-3 mm lesion in the mid pancreas, diffuse activity in the stomach EGD 10/09/2019- 3 areas of scar/clip/polypoid tissue removed with negative pathology EUS 10/09/2019-4 mm pancreas body lesion, FNA biopsy negative, no adenopathy EUS 06/19/2020-no evidence of recurrent tumor in areas of gastric body scars, nodularity was noted underneath 2 endoclips in the greater curvature, this area was biopsied and polyps were removed from the middle gastric body and proximal gastric antrum, biopsies from erosions and erythema were taken at the greater curvature, lesser curvature, and antrum.  An irregular lesion was identified in the pancreas body  measuring 3.4 x 4.6 mm, the pancreas lesion was biopsied.  The pathology from the pancreas FNA was negative, the pathology from multiple biopsies including the gastric body scar, antrum polyp, greater curvature biopsy, lesser curvature biopsy, and gastric cardia biopsy revealed low-grade neuroendocrine tumor Netspot 04/14/2021-no residual avid tumor in the stomach, no evidence of tracer avid metastatic disease, decrease in tracer activity associated with the pancreas lesion Upper endoscopy 06/12/2021-5 gastric polyps resected and retrieved-hyperplastic polyp with well differentiated neuroendocrine tumor, stomach cardia and fundus biopsy-reactive gastropathy with neuroendocrine dysplasia, lesser curvature biopsy-reactive gastropathy with neuroendocrine hyperplasia  Additionalproblems History of iron deficiency anemia secondary bleeding from #1 and maintained on anticoagulation therapy Hypercoagulation syndrome, antiphospholipid syndrome-maintained on Coumadin Diabetes Abdominal pain-potentially related to #1 6.   Hypothyroidism 7.  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  Family history of uterine and breast cancer 9.  Gastric and colonic adenomas Colonoscopy 06/12/2021-polyps removed from the cecum, ascending colon, and descending colon-tubular adenomas and hyperplastic polyp 10.  Hypodense pancreas lesion 05/18/2019 -positive Netspot uptake 08/11/2019, negative FNA biopsy Subjective:   Chief Complaint: Follow-up of gastric polyps/tumors  HPI Erin Kirk is a 66 year old woman with a complicated history around multiple gastric polyps that include adenoma, neuroendocrine tumors, hyperplastic polyps and intestinal metaplasia of the stomach.  She is followed by Dr. Julieanne Manson and Dr. Rush Landmark has performed multiple polypectomy procedures on her and has determined it appropriate for her to resume surveillance under my care.  She reports she is doing well.  No abdominal pain nausea or vomiting or  eating difficulties.  She has been on long-term iron supplementation with a history of iron deficiency.  A repeat  routine colonoscopy is due in September 2025.  She returns because we anticipate repeating upper GI endoscopy to perform surveillance of the stomach again, later this year.  She is getting ready to go on a family beach trip and is looking forward to that.  Several months ago she had an episode of back pain that she thinks might have been a kidney stone.  That has not recurred.  She saw Dr. Benay Spice in April.  He plans to see her again in October and consider repeating a Netspot in 2024.  Lab Results  Component Value Date   IRON 127 03/17/2022   TIBC 403.2 03/17/2022   FERRITIN 93.4 03/17/2022   Lab Results  Component Value Date   WBC 5.5 03/17/2022   HGB 13.8 03/17/2022   HCT 40.6 03/17/2022   MCV 89.1 03/17/2022   PLT 185.0 03/17/2022   Component Ref Range & Units 3 mo ago (01/08/22) 9 mo ago (07/10/21) 1 yr ago (03/03/21) 1 yr ago (11/05/20) 1 yr ago (06/27/20) 1 yr ago (05/06/20) 2 yr ago (11/14/19)  Chromogranin A (ng/mL) 0.0 - 101.8 ng/mL 763.7 High   725.9 High  CM  935.2 High  CM  511.8 High  CM  833.1 High  CM  905.8 High  CM  946.6 High  CM   Comment: (NOTE)     Allergies  Allergen Reactions   Guaifenesin & Derivatives Nausea And Vomiting    Hallucinations   Iohexol Anaphylaxis, Hives and Shortness Of Breath    Incident 1990, premedicated prior to obtaining IV contrast since.   Amoxicillin-Pot Clavulanate Swelling and Other (See Comments)    Facial swelling Did it involve swelling of the face/tongue/throat, SOB, or low BP? Yes Did it involve sudden or severe rash/hives, skin peeling, or any reaction on the inside of your mouth or nose? No Did you need to seek medical attention at a hospital or doctor's office? No When did it last happen?~7-8 years ago   If all above answers are "NO", may proceed with cephalosporin use.    Other     Contrast dye   Current  Meds  Medication Sig   acetaminophen (TYLENOL) 500 MG tablet Take 500 mg by mouth every 6 (six) hours as needed (for pain.).    Alum Hydroxide-Mag Trisilicate (GAVISCON) 60-73.7 MG CHEW Chew 1-2 tablets by mouth 2 (two) times daily as needed (indigestion).   Cholecalciferol (VITAMIN D3) 50 MCG (2000 UT) TABS Take 2,000 Units by mouth daily.   dicyclomine (BENTYL) 20 MG tablet Take 1 tablet (20 mg total) by mouth every 6 (six) hours as needed for spasms (abdominal cramps, pain).   diltiazem (CARDIZEM CD) 360 MG 24 hr capsule Take 1 capsule by mouth at bedtime   docusate sodium (COLACE) 100 MG capsule Take 100 mg by mouth at bedtime. Stool softner   ferrous sulfate 325 (65 FE) MG tablet Take 1 tablet (325 mg total) by mouth daily with breakfast. Lunch and supper (Patient taking differently: Take 325 mg by mouth daily with breakfast.)   levothyroxine (SYNTHROID) 100 MCG tablet Take 1 tablet by mouth once daily   lovastatin (MEVACOR) 10 MG tablet TAKE 1 TABLET BY MOUTH AT BEDTIME (Patient taking differently: Take 10 mg by mouth once a week.)   metoprolol succinate (TOPROL-XL) 25 MG 24 hr tablet Take 1 tablet by mouth once daily   telmisartan (MICARDIS) 40 MG tablet Take 1 tablet by mouth once daily   triamterene-hydrochlorothiazide (MAXZIDE-25) 37.5-25 MG tablet Take 1 tablet  by mouth once daily   vitamin B-12 (CYANOCOBALAMIN) 1000 MCG tablet Take 1,000 mcg by mouth daily.   warfarin (COUMADIN) 5 MG tablet TAKE AS DIRECTED BY  ANTICOGULATION  CLINIC   Past Medical History:  Diagnosis Date   Allergy    Anxiety    Arthritis    Asthma    many years ago   Blood transfusion without reported diagnosis    Cataract    Chronic kidney disease    Clotting disorder ([place])    Depression    Husband died 2019/06/08   Diabetes ([place])    not on medication   DVT (deep venous thrombosis) (HCC)    left arm   Dysrhythmia    Family history of bone cancer    Family history of breast cancer    Family history of  lung cancer    Family history of ovarian cancer    Family history of testicular cancer    Family history of uterine cancer    GERD (gastroesophageal reflux disease)    Headache    Hypertension    Hypothyroidism    Iron deficiency anemia 03/30/2019   Irregular heart beat    tachycardia   Lupus anticoagulant with hypercoagulable state ([place])    Melanoma ([place])    2005- face    Melanoma ([place])    face   Plantar fasciitis    Pneumonia 2019   Primary hypercoagulable state ([place])    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   Pulmonary embolism ([place])    Thyroid disease    hypothyroidism   Past Surgical History:  Procedure Laterality Date   ABDOMINAL HYSTERECTOMY     noncancerous   BACK SURGERY  1983   BIOPSY  06/19/2020   Procedure: BIOPSY;  Surgeon: Irving Copas., MD;  Location: [facility];  Service: Gastroenterology;;   BIOPSY  06/12/2021   Procedure: BIOPSY;  Surgeon: Irving Copas., MD;  Location: [facility] END[facility]COPY;  Service: Gastroenterology;;   CHOLECYSTECTOMY     COLONOSCOPY     COLONOSCOPY WITH PROPOFOL N/A 06/12/2021   Procedure: COLONOSCOPY WITH PROPOFOL;  Surgeon: Irving Copas., MD;  Location[facility];  Service: Gastroenterology;  Laterality: N/A;   ENDOSCOPIC MUCOSAL RESECTION N/A 07/12/2019   Procedure: ENDOSCOPIC MUCOSAL RESECTION;  Surgeon: Rush Landmark Telford Nab., MD;  Location: [place];  Service: Gastroenterology;  Laterality: N/A;   ENDOSCOPIC MUCOSAL RESECTION  06/19/2020   Procedure: ENDOSCOPIC MUCOSAL RESECTION;  Surgeon: Rush Landmark Telford Nab., MD;  Location: [facility] ENDOSCOPY;  Service: Gastroenterology;;   ESOPHAGOGASTRODUODENOSCOPY N/A 06/19/2020   Procedure: ESOPHAGOGASTRODUODENOSCOPY (EGD);  Surgeon: Irving Copas., MD;  Location: [facility];  Service: Gastroenterology;  Laterality: N/A;   ESOPHAGOGASTRODUODENOSCOPY (EGD) WITH PROPOFOL N/A 07/12/2019   Procedure:  ESOPHAGOGASTRODUODENOSCOPY (EGD) WITH PROPOFOL;  Surgeon: Rush Landmark Telford Nab., MD;  Location: [place];  Service: Gastroenterology;  Laterality: N/A;   ESOPHAGOGASTRODUODENOSCOPY (EGD) WITH PROPOFOL N/A 10/09/2019   Procedure: ESOPHAGOGASTRODUODENOSCOPY (EGD) WITH PROPOFOL;  Surgeon: Rush Landmark Telford Nab., MD;  Location: [place];  Service: Gastroenterology;  Laterality: N/A;   ESOPHAGOGASTRODUODENOSCOPY (EGD) WITH PROPOFOL N/A 06/12/2021   Procedure: ESOPHAGOGASTRODUODENOSCOPY (EGD) WITH PROPOFOL;  Surgeon: Rush Landmark Telford Nab., MD;  Location: [place];  Service: Gastroenterology;  Laterality: N/A;   EUS N/A 07/12/2019   Procedure: UPPER ENDOSCOPIC ULTRASOUND (EUS) RADIAL;  Surgeon: Irving Copas., MD;  Location: [place];  Service: Gastroenterology;  Laterality: N/A;   EUS N/A 10/09/2019   Procedure: UPPER ENDOSCOPIC ULTRASOUND (EUS) RADIAL;  Surgeon: Justice Britain  Brooke Bonito., MD;  Location: [place];  Service: Gastroenterology;  Laterality: N/A;   fatty tumor breast Right    removed   FINE NEEDLE ASPIRATION  06/19/2020   Procedure: FINE NEEDLE ASPIRATION (FNA) LINEAR;  Surgeon: Irving Copas., MD;  Location: [facility];  Service: Gastroenterology;;   FOREIGN BODY REMOVAL  10/09/2019   Procedure: FOREIGN BODY REMOVAL;  Surgeon: Irving Copas., MD;  Location: [place];  Service: Gastroenterology;;   FOREIGN BODY REMOVAL  06/19/2020   Procedure: FOREIGN BODY REMOVAL;  Surgeon: Irving Copas., MD;  Location: [facility];  Service: Gastroenterology;;  polyp removal with net    HEMOSTASIS CLIP PLACEMENT  07/12/2019   Procedure: HEMOSTASIS CLIP PLACEMENT;  Surgeon: Irving Copas., MD;  Location: [place];  Service: Gastroenterology;;   HEMOSTASIS CLIP PLACEMENT  10/09/2019   Procedure: HEMOSTASIS CLIP PLACEMENT;  Surgeon: Irving Copas., MD;  Location: [place];  Service: Gastroenterology;;   HEMOSTASIS CLIP  PLACEMENT  06/19/2020   Procedure: HEMOSTASIS CLIP PLACEMENT;  Surgeon: Irving Copas., MD;  Location: [facility];  Service: Gastroenterology;;   HEMOSTASIS CLIP PLACEMENT  06/12/2021   Procedure: HEMOSTASIS CLIP PLACEMENT;  Surgeon: Irving Copas., MD;  Location: [place];  Service: Gastroenterology;;   HEMOSTASIS CONTROL  07/12/2019   Procedure: HEMOSTASIS CONTROL;  Surgeon: Irving Copas., MD;  Location: [place];  Service: Gastroenterology;;   HYSTEROTOMY     KNEE ARTHROSCOPY Left    Lymp nodes Right 08/2004   MELANOMA EXCISION Right 2005   face    OOPHORECTOMY Bilateral    POLYPECTOMY  07/12/2019   Procedure: POLYPECTOMY;  Surgeon: Irving Copas., MD;  Location: [place];  Service: Gastroenterology;;   POLYPECTOMY  10/09/2019   Procedure: POLYPECTOMY;  Surgeon: Irving Copas., MD;  Location: [place];  Service: Gastroenterology;;   POLYPECTOMY  06/19/2020   Procedure: POLYPECTOMY;  Surgeon: Irving Copas., MD;  Location: [facility];  Service: Gastroenterology;;   POLYPECTOMY  06/12/2021   Procedure: POLYPECTOMY;  Surgeon: Irving Copas., MD;  Location: [place];  Service: Gastroenterology;;   removal lymphnodes in right neck     SUBMUCOSAL LIFTING INJECTION  07/12/2019   Procedure: SUBMUCOSAL LIFTING INJECTION;  Surgeon: Irving Copas., MD;  Location: [place];  Service: Gastroenterology;;   SUBMUCOSAL LIFTING INJECTION  10/09/2019   Procedure: SUBMUCOSAL LIFTING INJECTION;  Surgeon: Irving Copas., MD;  Location: [place];  Service: Gastroenterology;;   Maryagnes Amos INJECTION  06/12/2021   Procedure: SUBMUCOSAL LIFTING INJECTION;  Surgeon: Irving Copas., MD;  Location: [place];  Service: Gastroenterology;;   UPPER ESOPHAGEAL ENDOSCOPIC ULTRASOUND (EUS) N/A 06/19/2020   Procedure: UPPER ESOPHAGEAL ENDOSCOPIC ULTRASOUND (EUS);  Surgeon: Irving Copas.,  MD;  Location: [facility];  Service: Gastroenterology;  Laterality: N/A;   UPPER GASTROINTESTINAL ENDOSCOPY     Social History   Social History Narrative   Married 1971. 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    family history includes Alzheimer's disease in her maternal aunt; Bone cancer (age of onset: 47) in her nephew; Breast cancer in her niece and other family members; Breast cancer (age of onset: 21) in her half-sister and half-sister; Breast cancer (age of onset: 95) in her maternal aunt; Cancer (age of onset: 30)  in her niece; Coronary artery disease in her mother; Diabetes in her father, mother, and sister; Hypertension in her father; Lung cancer in her nephew; Mitral valve prolapse in her sister; Ovarian cancer (age of onset: 79) in her mother; Uterine cancer (age of onset: 64) in her niece.   Review of Systems As above  Objective:   Physical Exam BP 90/60   Pulse 93   Ht 5' 4.5" (1.638 m)   Wt 193 lb (87.5 kg)   LMP  (LMP Unknown)   BMI 32.62 kg/m @  General:  NAD Eyes:   anicteric Lungs:  clear Heart::  S1S2 no rubs, murmurs or gallops Abdomen:  soft and nontender, BS+ Ext:   no edema, cyanosis or clubbing    Data Reviewed: See HPI

## 2022-04-10 NOTE — Telephone Encounter (Signed)
Procedure: EGD, 05/28/22 CrCl: 61.82 mL/min Current Wt: 88 kg Lovenox; 120 mg, QD, 8 syringes   9/2: Take last dose of coumadin 9/3: No coumadin, no Lovenox 9/4: No coumadin, Lovenox once in AM 9/5: No coumadin, Lovenox once in AM 9/6: No coumadin, Lovenox once in AM (BEFORE 7 AM)  9/7: Surgery; NO COUMADIN, NO LOVENOX  9/8: Take 1 1/2 tablets (7.5 mg) coumadin, Lovenox once in AM 9/9:  Take 1 1/2 tablets (7.5 mg) coumadin, Lovenox once in AM 9/10: Take 1 1/2 tablets (7.5 mg) coumadin, Lovenox once in AM 9/11: Take 1 tablets (5 mg) coumadin, Lovenox once in AM 9/12: Take 1 tablet (5 mg) coumadin, Lovenox once in AM 9/13: Recheck INR; No coumadin or Lovenox until after INR check

## 2022-04-10 NOTE — Telephone Encounter (Signed)
Looks great thanks

## 2022-04-13 NOTE — Telephone Encounter (Signed)
Advised pt a lovenox bridge has been completed and she will receive instructions at her coumadin clinic apt on 8/23. Advised if anything is needed before apt to contact office. Pt verbalized understanding.

## 2022-04-13 NOTE — Telephone Encounter (Signed)
LVM

## 2022-04-16 ENCOUNTER — Other Ambulatory Visit: Payer: Self-pay | Admitting: Family Medicine

## 2022-04-25 ENCOUNTER — Other Ambulatory Visit: Payer: Self-pay | Admitting: Family Medicine

## 2022-04-27 ENCOUNTER — Other Ambulatory Visit: Payer: Self-pay | Admitting: Family Medicine

## 2022-04-27 ENCOUNTER — Telehealth: Payer: Self-pay | Admitting: Family Medicine

## 2022-04-27 MED ORDER — WARFARIN SODIUM 5 MG PO TABS: ORAL_TABLET | ORAL | 0 refills | Status: DC

## 2022-04-27 NOTE — Telephone Encounter (Signed)
LVM for pt to verify she wanted script sent to [facility] in [facility].  Contacted pharmacy and spoke with Gerald Stabs who reports pt requested the prescription last week while she is out of town. They could not get clarification to directions of the prescription so they gave pt enough warfarin for 1 week until they could get clarification. Advised a new prescription will be sent. Gerald Stabs reports if the pt wants it sent to her pharmacy at home they can transfer it.   Sent in new script with new sig per her last coumadin clinic apt.

## 2022-04-27 NOTE — Telephone Encounter (Signed)
Hi Shannon, can you clarify the directions on how this should be taken with the pharmacy please?

## 2022-04-27 NOTE — Telephone Encounter (Signed)
Pt is requesting refill on meds she left at home and is out of town.  Patient Name: Erin Kirk 25 Gender: Female DOB: [birth year] Age: 66 Y 2 M 6 D Return Phone Number: [phone] (Primary) Address: City/ State/ Zip: [place] [facility] [place] Client Site [place] [facility] [place] Night Provider Garret Reddish- MD Contact Type Call Who Is Calling Patient / Member / Family / Caregiver Call Type Triage / Clinical Relationship To Patient Self Return Phone Number [phone] (Primary) Chief Complaint Leg Swelling And Edema Reason for Call Medication Question / Request Initial Comment Pt is on Coumadin. She is at [facility] and forgot her Coumadin. She has no refills left on the bottle. She had just gotten it refilled. Since she doesn't have a refill the pharmacy cannot give her a partial. She needs to get it authorized. She is on Coumadin for blood clotting disorder. She cannot go a whole week without her medication. She hasn't taken it for today. Doctor has to ok it. She reports leg swelling from the ride. [facility] [address], [place], [place] [address] [phone]. It will need to go to [facility]. [place], [place], [place] [address]. [phone]. Closes at 7 pm. Ask for Debbie. Translation No Guidelines Guideline Title Affirmed Question Affirmed Notes Nurse Date/Time [name] Time) Leg Swelling and Edema [1] MODERATE leg swelling (e.g., swelling extends up to knees) AND [2] new-onset or worsening Hendricks Limes, RN, Kennyth Arnold 04/25/2022 6:24:20 PM Disp. Time [name] Time) Disposition Final User 04/25/2022 6:31:15 PM Called On-Call Provider Hendricks Limes, RN, Kennyth Arnold 04/25/2022 6:31:55 PM See PCP within 24 Hours Yes Hendricks Limes, RN, Kennyth Arnold Final Disposition 04/25/2022 6:31:55 PM See PCP within 24 Hours Yes Hendricks Limes, RN,  Kennyth Arnold Caller Disagree/Comply Comply Caller Understands Yes PreDisposition Did not know what to do Care Advice Given Per Guideline SEE PCP WITHIN 24 HOURS: CARE ADVICE given per Leg Swelling and Edema (Adult) guideline. CALL BACK IF: * You become worse Comments User: Shireen Quan Date/Time (Eastern Time): 04/25/2022 6:16:00 PM [employer]. [place], [place], [place] [address]. [phone]. Closes at 7 pm. Ask for Debbie. User: Cristopher Peru, RN Date/Time [name] Time): 04/25/2022 6:33:46 PM RN notified caller that on call advised that they will call [place] at [phone]. Caller verbalized understanding. Referrals REFERRED TO PCP OFFICE Paging DoctorName Phone DateTime Result/ Outcome Message Type Notes Caryl Bis [phone] 04/25/2022 6:31:15 PM Called On Call Provider - Reached Doctor Paged Caryl Bis 04/25/2022 6:32:44 PM Spoke with On Call - General Message Result RN notified by on call that they will call [place] at [phone]. No further instructions.

## 2022-04-27 NOTE — Telephone Encounter (Signed)
Refill sent to [facility].

## 2022-05-04 ENCOUNTER — Other Ambulatory Visit: Payer: Self-pay | Admitting: *Deleted

## 2022-05-04 NOTE — Patient Outreach (Signed)
  Care Coordination   05/04/2022 Name: Erin Kirk MRN: [id number] DOB: 1955-11-03   Care Coordination Outreach Attempts:  An unsuccessful telephone outreach was attempted today to offer the patient information about available care coordination services as a benefit of their health plan.   Follow Up Plan:  Additional outreach attempts will be made to offer the patient care coordination information and services.   Encounter Outcome:  No Answer  Care Coordination Interventions Activated:  No   Care Coordination Interventions:  No, not indicated    Raina Mina, RN Care Management Coordinator [place] Office [phone]

## 2022-05-13 ENCOUNTER — Other Ambulatory Visit: Payer: Self-pay | Admitting: *Deleted

## 2022-05-13 ENCOUNTER — Ambulatory Visit (INDEPENDENT_AMBULATORY_CARE_PROVIDER_SITE_OTHER): Payer: Medicare Other

## 2022-05-13 DIAGNOSIS — Z7901 Long term (current) use of anticoagulants: Secondary | ICD-10-CM

## 2022-05-13 LAB — POCT INR: INR: 3.3 — AB (ref 2.0–3.0)

## 2022-05-13 MED ORDER — ENOXAPARIN SODIUM 120 MG/0.8ML IJ SOSY: 120.0000 mg | PREFILLED_SYRINGE | INTRAMUSCULAR | 0 refills | Status: DC

## 2022-05-13 NOTE — Progress Notes (Signed)
Follow current dosing until starting lovenox bridge instructions below. Continue taking 1 tablet daily except take 1/2 tablet on Mondays. Recheck on 9/13.  9/2: Take last dose of coumadin 9/3: No coumadin, no Lovenox 9/4: No coumadin, Lovenox once in AM 9/5: No coumadin, Lovenox once in AM 9/6: No coumadin, Lovenox once in AM (BEFORE 7 AM)   9/7: Surgery; NO COUMADIN, NO LOVENOX   9/8: Take 1 1/2 tablets (7.5 mg) coumadin, Lovenox once in AM 9/9:  Take 1 1/2 tablets (7.5 mg) coumadin, Lovenox once in AM 9/10: Take 1 1/2 tablets (7.5 mg) coumadin, Lovenox once in AM 9/11: Take 1 tablets (5 mg) coumadin, Lovenox once in AM 9/12: Take 1 tablet (5 mg) coumadin, Lovenox once in AM 9/13: Recheck INR; No coumadin or Lovenox until after INR check

## 2022-05-13 NOTE — Patient Instructions (Addendum)
Pre visit review using our clinic review tool, if applicable. No additional management support is needed unless otherwise documented below in the visit note.  Follow current dosing until starting lovenox bridge instructions below. Continue taking 1 tablet daily except take 1/2 tablet on Mondays. Recheck on 9/13.  9/2: Take last dose of coumadin 9/3: No coumadin, no Lovenox 9/4: No coumadin, Lovenox once in AM 9/5: No coumadin, Lovenox once in AM 9/6: No coumadin, Lovenox once in AM (BEFORE 7 AM)   9/7: Surgery; NO COUMADIN, NO LOVENOX   9/8: Take 1 1/2 tablets (7.5 mg) coumadin, Lovenox once in AM 9/9:  Take 1 1/2 tablets (7.5 mg) coumadin, Lovenox once in AM 9/10: Take 1 1/2 tablets (7.5 mg) coumadin, Lovenox once in AM 9/11: Take 1 tablets (5 mg) coumadin, Lovenox once in AM 9/12: Take 1 tablet (5 mg) coumadin, Lovenox once in AM 9/13: Recheck INR; No coumadin or Lovenox until after INR check

## 2022-05-13 NOTE — Patient Outreach (Signed)
  Care Coordination   05/13/2022 Name: Erin Kirk MRN: [id number] DOB: 29-Mar-1956   Care Coordination Outreach Attempts:  A second unsuccessful outreach was attempted today to offer the patient with information about available care coordination services as a benefit of their health plan.     Follow Up Plan:  Additional outreach attempts will be made to offer the patient care coordination information and services.   Encounter Outcome:  No Answer  Care Coordination Interventions Activated:  No   Care Coordination Interventions:  No, not indicated    Raina Mina, RN Care Management Coordinator [place] Office [phone]

## 2022-05-20 ENCOUNTER — Other Ambulatory Visit: Payer: Self-pay | Admitting: Family Medicine

## 2022-05-20 ENCOUNTER — Encounter (HOSPITAL_COMMUNITY): Payer: Self-pay | Admitting: Internal Medicine

## 2022-05-21 ENCOUNTER — Telehealth: Payer: Self-pay

## 2022-05-21 NOTE — Telephone Encounter (Signed)
Patient made aware that procedure was moved up 15 minutes earlier, and that she will need to arrive at [facility] by 9:00 am. Pt verbalized all understanding.

## 2022-05-22 ENCOUNTER — Ambulatory Visit: Payer: Self-pay

## 2022-05-22 NOTE — Patient Instructions (Signed)
Visit Information  Thank you for taking time to visit with me today. Please don't hesitate to contact me if I can be of assistance to you.   Following are the goals we discussed today:   Goals Addressed             This Visit's Progress    COMPLETED: Care Coordination Activities       Care Coordination Interventions: SDoH screening completed - no acute challenges identified at this time Determined the patient does not have concerns with medications costs and/or adherence at this time Performed chart review to confirm patient participated in annual wellness visit on 02/03/22 Education provided on role of care coordination team - no follow up desired at this time Instructed the patient to contact her primary care provider as needed         Please call the care guide team at [phone] if you need to schedule an appointment with our care coordination team  If you are experiencing a Mental Health or [place] or need someone to talk to, please call [phone]-TALK (toll free, 24 hour hotline)  The patient verbalized understanding of instructions, educational materials, and care plan provided today and DECLINED offer to receive copy of patient instructions, educational materials, and care plan.   No further follow up required: Please contact your primary care provider as needed  Daneen Schick, BSW, CDP Social Worker, Certified Dementia Practitioner Care Coordination [phone]

## 2022-05-22 NOTE — Patient Outreach (Signed)
  Care Coordination   Initial Visit Note   05/22/2022 Name: Erin Kirk MRN: [id number] DOB: [birth year]  Erin Kirk is a 66 y.o. year old female who sees Yong Channel, Brayton Mars, MD for primary care. I spoke with  [name] by phone today.  What matters to the patients health and wellness today?  Doing well, no concerns at this time    Goals Addressed             This Visit's Progress    COMPLETED: Care Coordination Activities       Care Coordination Interventions: SDoH screening completed - no acute challenges identified at this time Determined the patient does not have concerns with medications costs and/or adherence at this time Performed chart review to confirm patient participated in annual wellness visit on 02/03/22 Education provided on role of care coordination team - no follow up desired at this time Instructed the patient to contact her primary care provider as needed         SDOH assessments and interventions completed:  Yes  SDOH Interventions Today    Flowsheet Row Most Recent Value  SDOH Interventions   Food Insecurity Interventions Intervention Not Indicated  Housing Interventions Intervention Not Indicated  Transportation Interventions Intervention Not Indicated        Care Coordination Interventions Activated:  Yes  Care Coordination Interventions:  Yes, provided   Follow up plan: No further intervention required.   Encounter Outcome:  Pt. Visit Completed   Daneen Schick, BSW, CDP Social Worker, Certified Dementia Practitioner Care Coordination [phone]

## 2022-05-28 ENCOUNTER — Ambulatory Visit (HOSPITAL_COMMUNITY)
Admission: RE | Admit: 2022-05-28 | Discharge: 2022-05-28 | Disposition: A | Discharge status: Home / Self Care | Payer: Medicare Other | Attending: Internal Medicine | Admitting: Internal Medicine

## 2022-05-28 ENCOUNTER — Encounter (HOSPITAL_COMMUNITY): Payer: Self-pay | Admitting: Internal Medicine

## 2022-05-28 ENCOUNTER — Other Ambulatory Visit: Payer: Self-pay

## 2022-05-28 ENCOUNTER — Ambulatory Visit (HOSPITAL_BASED_OUTPATIENT_CLINIC_OR_DEPARTMENT_OTHER): Payer: Medicare Other | Admitting: Certified Registered Nurse Anesthetist

## 2022-05-28 ENCOUNTER — Encounter (HOSPITAL_COMMUNITY)
Admission: RE | Disposition: A | Discharge status: Home / Self Care | Payer: Self-pay | Source: Home / Self Care | Attending: Internal Medicine

## 2022-05-28 ENCOUNTER — Ambulatory Visit (HOSPITAL_COMMUNITY): Payer: Medicare Other | Admitting: Certified Registered Nurse Anesthetist

## 2022-05-28 ENCOUNTER — Other Ambulatory Visit: Payer: Self-pay | Admitting: Internal Medicine

## 2022-05-28 DIAGNOSIS — I4891 Unspecified atrial fibrillation: Secondary | ICD-10-CM | POA: Diagnosis not present

## 2022-05-28 DIAGNOSIS — K2950 Unspecified chronic gastritis without bleeding: Secondary | ICD-10-CM | POA: Diagnosis not present

## 2022-05-28 DIAGNOSIS — Z79899 Other long term (current) drug therapy: Secondary | ICD-10-CM | POA: Insufficient documentation

## 2022-05-28 DIAGNOSIS — E1122 Type 2 diabetes mellitus with diabetic chronic kidney disease: Secondary | ICD-10-CM | POA: Insufficient documentation

## 2022-05-28 DIAGNOSIS — F418 Other specified anxiety disorders: Secondary | ICD-10-CM | POA: Insufficient documentation

## 2022-05-28 DIAGNOSIS — K3189 Other diseases of stomach and duodenum: Secondary | ICD-10-CM | POA: Insufficient documentation

## 2022-05-28 DIAGNOSIS — K317 Polyp of stomach and duodenum: Secondary | ICD-10-CM | POA: Insufficient documentation

## 2022-05-28 DIAGNOSIS — Z86711 Personal history of pulmonary embolism: Secondary | ICD-10-CM | POA: Diagnosis not present

## 2022-05-28 DIAGNOSIS — Z7901 Long term (current) use of anticoagulants: Secondary | ICD-10-CM | POA: Diagnosis not present

## 2022-05-28 DIAGNOSIS — Z85020 Personal history of malignant carcinoid tumor of stomach: Secondary | ICD-10-CM

## 2022-05-28 DIAGNOSIS — I10 Essential (primary) hypertension: Secondary | ICD-10-CM

## 2022-05-28 DIAGNOSIS — N189 Chronic kidney disease, unspecified: Secondary | ICD-10-CM | POA: Diagnosis not present

## 2022-05-28 DIAGNOSIS — M329 Systemic lupus erythematosus, unspecified: Secondary | ICD-10-CM | POA: Diagnosis not present

## 2022-05-28 DIAGNOSIS — I129 Hypertensive chronic kidney disease with stage 1 through stage 4 chronic kidney disease, or unspecified chronic kidney disease: Secondary | ICD-10-CM | POA: Insufficient documentation

## 2022-05-28 DIAGNOSIS — K31A13 Gastric intestinal metaplasia without dysplasia, involving the fundus: Secondary | ICD-10-CM | POA: Diagnosis not present

## 2022-05-28 DIAGNOSIS — C7A092 Malignant carcinoid tumor of the stomach: Secondary | ICD-10-CM

## 2022-05-28 DIAGNOSIS — E039 Hypothyroidism, unspecified: Secondary | ICD-10-CM | POA: Diagnosis not present

## 2022-05-28 DIAGNOSIS — C7A8 Other malignant neuroendocrine tumors: Secondary | ICD-10-CM

## 2022-05-28 DIAGNOSIS — K219 Gastro-esophageal reflux disease without esophagitis: Secondary | ICD-10-CM | POA: Diagnosis not present

## 2022-05-28 DIAGNOSIS — Z86718 Personal history of other venous thrombosis and embolism: Secondary | ICD-10-CM | POA: Diagnosis not present

## 2022-05-28 DIAGNOSIS — D638 Anemia in other chronic diseases classified elsewhere: Secondary | ICD-10-CM | POA: Diagnosis not present

## 2022-05-28 DIAGNOSIS — Z09 Encounter for follow-up examination after completed treatment for conditions other than malignant neoplasm: Secondary | ICD-10-CM | POA: Diagnosis not present

## 2022-05-28 DIAGNOSIS — K319 Disease of stomach and duodenum, unspecified: Secondary | ICD-10-CM | POA: Diagnosis not present

## 2022-05-28 HISTORY — PX: SUBMUCOSAL LIFTING INJECTION: SHX6855

## 2022-05-28 HISTORY — PX: HEMOSTASIS CLIP PLACEMENT: SHX6857

## 2022-05-28 HISTORY — PX: POLYPECTOMY: SHX5525

## 2022-05-28 HISTORY — PX: BIOPSY: SHX5522

## 2022-05-28 HISTORY — PX: ESOPHAGOGASTRODUODENOSCOPY (EGD) WITH PROPOFOL: SHX5813

## 2022-05-28 SURGERY — ESOPHAGOGASTRODUODENOSCOPY (EGD) WITH PROPOFOL
Anesthesia: Monitor Anesthesia Care

## 2022-05-28 MED ORDER — PHENYLEPHRINE 80 MCG/ML (10ML) SYRINGE FOR IV PUSH (FOR BLOOD PRESSURE SUPPORT)
PREFILLED_SYRINGE | INTRAVENOUS | Status: DC | PRN
  Administered 2022-05-28: 80 ug via INTRAVENOUS

## 2022-05-28 MED ORDER — SODIUM CHLORIDE (PF) 0.9 % IJ SOLN
INTRAMUSCULAR | Status: DC | PRN
  Administered 2022-05-28: 6 mL via INTRAVENOUS

## 2022-05-28 MED ORDER — LOVASTATIN 10 MG PO TABS: 10.0000 mg | ORAL_TABLET | ORAL | Status: DC

## 2022-05-28 MED ORDER — FERROUS SULFATE 325 (65 FE) MG PO TABS: 325.0000 mg | ORAL_TABLET | Freq: Every day | ORAL | Status: AC

## 2022-05-28 MED ORDER — LACTATED RINGERS IV SOLN: INTRAVENOUS | Status: DC

## 2022-05-28 MED ORDER — OMEPRAZOLE 40 MG PO CPDR
40.0000 mg | DELAYED_RELEASE_CAPSULE | Freq: Every day | ORAL | 3 refills | Status: DC
Start: 2022-05-28 — End: 2023-05-27

## 2022-05-28 MED ORDER — LIDOCAINE 2% (20 MG/ML) 5 ML SYRINGE
INTRAMUSCULAR | Status: DC | PRN
  Administered 2022-05-28: 100 mg via INTRAVENOUS

## 2022-05-28 MED ORDER — SODIUM CHLORIDE 0.9 % IV SOLN: INTRAVENOUS | Status: DC

## 2022-05-28 MED ORDER — PROPOFOL 500 MG/50ML IV EMUL
INTRAVENOUS | Status: AC
  Filled 2022-05-28: qty 50

## 2022-05-28 MED ORDER — PROPOFOL 500 MG/50ML IV EMUL
INTRAVENOUS | Status: DC | PRN
  Administered 2022-05-28: 125 ug/kg/min via INTRAVENOUS

## 2022-05-28 MED ORDER — PROPOFOL 10 MG/ML IV BOLUS
INTRAVENOUS | Status: DC | PRN
  Administered 2022-05-28: 20 mg via INTRAVENOUS

## 2022-05-28 SURGICAL SUPPLY — 15 items

## 2022-05-28 NOTE — H&P (Signed)
[facility] History and Physical   Primary Care Physician:  Marin Olp, MD   Reason for Procedure:   F/u gastric carcinoid, gastric polyps  Plan:    EGD     HPI: Erin Kirk is a 66 y.o. female with the problems listed below - here for a surveillance EGD. She has held warfarin and is using a Lovenox bridge per PCP for anti-phospholipid antibody syndrome  Multiple gastric carcinoid tumors Upper endoscopy 05/05/2019 with resection of multiple gastric carcinoid tumors EUS 07/12/2019-resection of multiple gastric carcinoid tumors, positive resection margin at the gastric cardia polypectomy site, tubular adenomas and hyperplastic polyps removed from the lesser curvature Elevated chromogranin A level CT abdomen/pelvis 05/18/2019-no evidence of a gastric mass or metastatic disease Netspot 08/11/2019-focal activity corresponding to a 2-3 mm lesion in the mid pancreas, diffuse activity in the stomach EGD 10/09/2019- 3 areas of scar/clip/polypoid tissue removed with negative pathology EUS 10/09/2019-4 mm pancreas body lesion, FNA biopsy negative, no adenopathy EUS 06/19/2020-no evidence of recurrent tumor in areas of gastric body scars, nodularity was noted underneath 2 endoclips in the greater curvature, this area was biopsied and polyps were removed from the middle gastric body and proximal gastric antrum, biopsies from erosions and erythema were taken at the greater curvature, lesser curvature, and antrum.  An irregular lesion was identified in the pancreas body measuring 3.4 x 4.6 mm, the pancreas lesion was biopsied.  The pathology from the pancreas FNA was negative, the pathology from multiple biopsies including the gastric body scar, antrum polyp, greater curvature biopsy, lesser curvature biopsy, and gastric cardia biopsy revealed low-grade neuroendocrine tumor Netspot 04/14/2021-no residual avid tumor in the stomach, no evidence of tracer avid metastatic disease, decrease in  tracer activity associated with the pancreas lesion Upper endoscopy 06/12/2021-5 gastric polyps resected and retrieved-hyperplastic polyp with well differentiated neuroendocrine tumor, stomach cardia and fundus biopsy-reactive gastropathy with neuroendocrine dysplasia, lesser curvature biopsy-reactive gastropathy with neuroendocrine hyperplasia   Additionalproblems History of iron deficiency anemia secondary bleeding from #1 and maintained on anticoagulation therapy Hypercoagulation syndrome, antiphospholipid syndrome-maintained on Coumadin Diabetes Abdominal pain-potentially related to #1 6.   Hypothyroidism 7.  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  Family history of uterine and breast cancer 9.  Gastric and colonic adenomas Colonoscopy 06/12/2021-polyps removed from the cecum, ascending colon, and descending colon-tubular adenomas and hyperplastic polyp 10.  Hypodense pancreas lesion 05/18/2019 -positive Netspot uptake 08/11/2019, negative FNA biopsy Past Medical History:  Diagnosis Date   Allergy    Anxiety    Arthritis    Asthma    many years ago   Blood transfusion without reported diagnosis    Cataract    Chronic kidney disease    Clotting disorder ([place])    Depression    Husband died Jul 04, 2019   Diabetes ([place])    not on medication   DVT (deep venous thrombosis) ([place])    left arm   Dysrhythmia    Family history of bone cancer    Family history of breast cancer    Family history of lung cancer    Family history of ovarian cancer    Family history of testicular cancer    Family history of uterine cancer    GERD (gastroesophageal reflux disease)    Headache    Hypertension    Hypothyroidism    Iron deficiency anemia 03/30/2019   Irregular heart beat    tachycardia   Lupus anticoagulant with hypercoagulable state ([place])    Melanoma ([place])  2005- face    Melanoma [facility])    face   Plantar fasciitis    Pneumonia 2019   Primary hypercoagulable state ([place])    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 05/12/2019   Pulmonary embolism ([place])    Thyroid disease    hypothyroidism    Past Surgical History:  Procedure Laterality Date   ABDOMINAL HYSTERECTOMY     noncancerous   BACK SURGERY  1983   BIOPSY  06/19/2020   Procedure: BIOPSY;  Surgeon: Irving Copas., MD;  Location: [facility];  Service: Gastroenterology;;   BIOPSY  06/12/2021   Procedure: BIOPSY;  Surgeon: Irving Copas., MD;  Location: [facility];  Service: Gastroenterology;;   CHOLECYSTECTOMY     COLONOSCOPY     COLONOSCOPY WITH PROPOFOL N/A 06/12/2021   Procedure: COLONOSCOPY WITH PROPOFOL;  Surgeon: Irving Copas., MD;  Location: [place];  Service: Gastroenterology;  Laterality: N/A;   ENDOSCOPIC MUCOSAL RESECTION N/A 07/12/2019   Procedure: ENDOSCOPIC MUCOSAL RESECTION;  Surgeon: Rush Landmark Telford Nab., MD;  Location: [place];  Service: Gastroenterology;  Laterality: N/A;   ENDOSCOPIC MUCOSAL RESECTION  06/19/2020   Procedure: ENDOSCOPIC MUCOSAL RESECTION;  Surgeon: Rush Landmark Telford Nab., MD;  Location: [facility];  Service: Gastroenterology;;   ESOPHAGOGASTRODUODENOSCOPY N/A 06/19/2020   Procedure: ESOPHAGOGASTRODUODENOSCOPY (EGD);  Surgeon: Irving Copas., MD;  Location: [facility];  Service: Gastroenterology;  Laterality: N/A;   ESOPHAGOGASTRODUODENOSCOPY (EGD) WITH PROPOFOL N/A 07/12/2019   Procedure: ESOPHAGOGASTRODUODENOSCOPY (EGD) WITH PROPOFOL;  Surgeon: Rush Landmark Telford Nab., MD;  Location: [place];  Service: Gastroenterology;  Laterality: N/A;   ESOPHAGOGASTRODUODENOSCOPY (EGD) WITH PROPOFOL N/A 10/09/2019   Procedure: ESOPHAGOGASTRODUODENOSCOPY (EGD) WITH PROPOFOL;  Surgeon: Rush Landmark Telford Nab., MD;  Location: [place];  Service: Gastroenterology;  Laterality: N/A;   ESOPHAGOGASTRODUODENOSCOPY (EGD) WITH PROPOFOL N/A 06/12/2021   Procedure: ESOPHAGOGASTRODUODENOSCOPY (EGD) WITH  PROPOFOL;  Surgeon: Rush Landmark Telford Nab., MD;  Location: [place];  Service: Gastroenterology;  Laterality: N/A;   EUS N/A 07/12/2019   Procedure: UPPER ENDOSCOPIC ULTRASOUND (EUS) RADIAL;  Surgeon: Irving Copas., MD;  Location: [place];  Service: Gastroenterology;  Laterality: N/A;   EUS N/A 10/09/2019   Procedure: UPPER ENDOSCOPIC ULTRASOUND (EUS) RADIAL;  Surgeon: Irving Copas., MD;  Location: [place];  Service: Gastroenterology;  Laterality: N/A;   fatty tumor breast Right    removed   FINE NEEDLE ASPIRATION  06/19/2020   Procedure: FINE NEEDLE ASPIRATION (FNA) LINEAR;  Surgeon: Irving Copas., MD;  Location: [facility];  Service: Gastroenterology;;   FOREIGN BODY REMOVAL  10/09/2019   Procedure: FOREIGN BODY REMOVAL;  Surgeon: Irving Copas., MD;  Location: [place];  Service: Gastroenterology;;   FOREIGN BODY REMOVAL  06/19/2020   Procedure: FOREIGN BODY REMOVAL;  Surgeon: Irving Copas., MD;  Location: [facility];  Service: Gastroenterology;;  polyp removal with net    HEMOSTASIS CLIP PLACEMENT  07/12/2019   Procedure: HEMOSTASIS CLIP PLACEMENT;  Surgeon: Irving Copas., MD;  Location: [place];  Service: Gastroenterology;;   HEMOSTASIS CLIP PLACEMENT  10/09/2019   Procedure: HEMOSTASIS CLIP PLACEMENT;  Surgeon: Irving Copas., MD;  Location: [place];  Service: Gastroenterology;;   HEMOSTASIS CLIP PLACEMENT  06/19/2020   Procedure: HEMOSTASIS CLIP PLACEMENT;  Surgeon: Irving Copas., MD;  Location: [facility];  Service: Gastroenterology;;   HEMOSTASIS CLIP PLACEMENT  06/12/2021   Procedure: HEMOSTASIS CLIP PLACEMENT;  Surgeon: Irving Copas., MD;  Location: [place];  Service: Gastroenterology;;   HEMOSTASIS CONTROL  07/12/2019  Procedure: HEMOSTASIS CONTROL;  Surgeon: Irving Copas., MD;  Location: [place];  Service: Gastroenterology;;   HYSTEROTOMY      KNEE ARTHROSCOPY Left    Lymp nodes Right 08/2004   MELANOMA EXCISION Right 2005   face    OOPHORECTOMY Bilateral    POLYPECTOMY  07/12/2019   Procedure: POLYPECTOMY;  Surgeon: Rush Landmark Telford Nab., MD;  Location: [place];  Service: Gastroenterology;;   POLYPECTOMY  10/09/2019   Procedure: POLYPECTOMY;  Surgeon: Irving Copas., MD;  Location: [place];  Service: Gastroenterology;;   POLYPECTOMY  06/19/2020   Procedure: POLYPECTOMY;  Surgeon: Irving Copas., MD;  Location: [facility];  Service: Gastroenterology;;   POLYPECTOMY  06/12/2021   Procedure: POLYPECTOMY;  Surgeon: Irving Copas., MD;  Location: [place];  Service: Gastroenterology;;   removal lymphnodes in right neck     SUBMUCOSAL LIFTING INJECTION  07/12/2019   Procedure: SUBMUCOSAL LIFTING INJECTION;  Surgeon: Irving Copas., MD;  Location: [place];  Service: Gastroenterology;;   SUBMUCOSAL LIFTING INJECTION  10/09/2019   Procedure: SUBMUCOSAL LIFTING INJECTION;  Surgeon: Irving Copas., MD;  Location: [place];  Service: Gastroenterology;;   Maryagnes Amos INJECTION  06/12/2021   Procedure: SUBMUCOSAL LIFTING INJECTION;  Surgeon: Irving Copas., MD;  Location: [place];  Service: Gastroenterology;;   UPPER ESOPHAGEAL ENDOSCOPIC ULTRASOUND (EUS) N/A 06/19/2020   Procedure: UPPER ESOPHAGEAL ENDOSCOPIC ULTRASOUND (EUS);  Surgeon: Irving Copas., MD;  Location: [facility];  Service: Gastroenterology;  Laterality: N/A;   UPPER GASTROINTESTINAL ENDOSCOPY      Prior to Admission medications   Medication Sig Start Date End Date Taking? Authorizing Provider  acetaminophen (TYLENOL) 500 MG tablet Take 500 mg by mouth every 6 (six) hours as needed (for pain.).    Yes [provider]  Alum Hydroxide-Mag Trisilicate (GAVISCON) 30-16.0 MG CHEW Chew 1-2 tablets by mouth 2 (two) times daily as needed (indigestion).   Yes [provider]  Cholecalciferol (VITAMIN D3) 50 MCG (2000 UT) TABS Take 2,000 Units by mouth daily.   Yes [provider]  diltiazem (CARDIZEM CD) 360 MG 24 hr capsule Take 1 capsule by mouth at bedtime 03/09/22  Yes Marin Olp, MD  docusate sodium (COLACE) 100 MG capsule Take 100 mg by mouth at bedtime. Stool softner   Yes [provider]  enoxaparin (LOVENOX) 120 MG/0.8ML injection Inject 0.8 mLs (120 mg total) into the skin daily. PATIENT RECEIVED INSTRUCTIONS AT ANTICOAGULATION CLINIC 05/13/22  Yes Marin Olp, MD  ferrous sulfate 325 (65 FE) MG tablet Take 1 tablet (325 mg total) by mouth daily with breakfast. Lunch and supper Patient taking differently: Take 325 mg by mouth daily with breakfast. 06/19/20  Yes Mansouraty, Telford Nab., MD  levothyroxine (SYNTHROID) 100 MCG tablet Take 1 tablet by mouth once daily 05/21/22  Yes Marin Olp, MD  lovastatin (MEVACOR) 10 MG tablet TAKE 1 TABLET BY MOUTH AT BEDTIME Patient taking differently: Take 10 mg by mouth once a week. 04/01/21  Yes Marin Olp, MD  metoprolol succinate (TOPROL-XL) 25 MG 24 hr tablet Take 1 tablet by mouth once daily 04/16/22  Yes Marin Olp, MD  omeprazole (PRILOSEC) 40 MG capsule Take 40 mg by mouth daily.   Yes [provider]  telmisartan (MICARDIS) 40 MG tablet Take 1 tablet by mouth once daily 05/21/22  Yes Marin Olp, MD  triamterene-hydrochlorothiazide [facility]) 37.5-25 MG tablet Take 1 tablet by mouth once daily 03/09/22  Yes Garret Reddish  O, MD  vitamin B-12 (CYANOCOBALAMIN) 1000 MCG tablet Take 1,000 mcg by mouth daily.   Yes [provider]  warfarin (COUMADIN) 5 MG tablet TAKE 1 TABLET BY MOUTH DAILY EXCEPT TAKE 1/2 TABLET ON MONDAYS OR AS DIRECTED BY ANTICOAGULATION CLINIC Patient taking differently: Take 2.5-5 mg by mouth See admin instructions. TAKE 1 TABLET BY MOUTH DAILY EXCEPT TAKE 1/2 TABLET ON MONDAYS OR AS DIRECTED BY ANTICOAGULATION  CLINIC 2.5 mg on Mon, 5 mg all other days at 4p 04/27/22  Yes Hunter, Brayton Mars, MD  dicyclomine (BENTYL) 20 MG tablet Take 1 tablet (20 mg total) by mouth every 6 (six) hours as needed for spasms (abdominal cramps, pain). 05/05/19   Gatha Mayer, MD    Current Facility-Administered Medications  Medication Dose Route Frequency Provider Last Rate Last Admin   0.9 %  sodium chloride infusion   Intravenous Continuous Gatha Mayer, MD       lactated ringers infusion   Intravenous Continuous Gatha Mayer, MD        Allergies as of 04/09/2022 - Review Complete 04/09/2022  Allergen Reaction Noted   Guaifenesin & derivatives Nausea And Vomiting 08/09/2018   Iohexol Anaphylaxis, Hives, and Shortness Of Breath 09/24/2012   Amoxicillin-pot clavulanate Swelling and Other (See Comments) 03/09/2007   Other  05/29/2014    Family History  Problem Relation Age of Onset   Coronary artery disease Mother        Mom 51, Dad 57-smoker   Diabetes Mother    Ovarian cancer Mother 5       not officially diagnosed   Hypertension Father    Diabetes Father    Mitral valve prolapse Sister    Diabetes Sister    Breast cancer Maternal Aunt 59   Breast cancer Half-Sister 54   Breast cancer Half-Sister 38   Uterine cancer Niece 48   Breast cancer Niece        diagnosed mid-60s   Lung cancer Nephew    Bone cancer Nephew 79   Cancer Niece 86       unknown type   Alzheimer's disease Maternal Aunt    Breast cancer Other        maternal great-aunt, diagnosed in her 49s   Breast cancer Other        4th degree maternal relative, diagnosed in her 62s   Colon cancer Neg Hx    Esophageal cancer Neg Hx    Pancreatic cancer Neg Hx    Rectal cancer Neg Hx    Stomach cancer Neg Hx    Inflammatory bowel disease Neg Hx    Liver disease Neg Hx     Social History   Socioeconomic History   Marital status: Widowed    Spouse name: Not on file   Number of children: Not on file   Years of education: Not on  file   Highest education level: Not on file  Occupational History   Not on file  Tobacco Use   Smoking status: Never   Smokeless tobacco: Never  Vaping Use   Vaping Use: Never used  Substance and Sexual Activity   Alcohol use: Never    Comment: no    Drug use: Never   Sexual activity: Yes  Other Topics Concern   Not on file  Social History Narrative   Married 1971. 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    Social Determinants of Health   Financial Resource Strain: Low Risk  (02/03/2022)   Overall Financial Resource Strain (CARDIA)    Difficulty of Paying Living Expenses: Not hard at all  Food Insecurity: No Food Insecurity (05/22/2022)   Hunger Vital Sign    Worried About Running Out of Food in the Last Year: Never true    Ran Out of Food in the Last Year: Never true  Transportation Needs: No Transportation Needs (05/22/2022)   PRAPARE - Hydrologist (Medical): No    Lack of Transportation (Non-Medical): No  Physical Activity: Sufficiently Active (02/03/2022)   Exercise Vital Sign    Days of Exercise per Week: 5 days    Minutes of Exercise per Session: 60 min  Stress: No Stress Concern Present (02/03/2022)   [place]    Feeling of Stress : Not at all  Social Connections: Moderately Isolated (02/03/2022)   Social Connection and Isolation Panel [NHANES]    Frequency of Communication with Friends and Family: More than three times a week    Frequency of Social Gatherings with Friends and Family: More than three times a week    Attends Religious Services: 1 to 4 times per year    Active Member of [employer] or Organizations: No    Attends Archivist Meetings: Never    Marital Status: Widowed   Intimate Partner Violence: Not At Risk (02/03/2022)   Humiliation, Afraid, Rape, and Kick questionnaire    Fear of Current or Ex-Partner: No    Emotionally Abused: No    Physically Abused: No    Sexually Abused: No    Review of Systems:  All other review of systems negative except as mentioned in the HPI.  Physical Exam: Vital signs LMP  (LMP Unknown)   General:   Alert,  Well-developed, well-nourished, pleasant and cooperative in NAD Lungs:  Clear throughout to auscultation.   Heart:  Regular rate and rhythm; no murmurs, clicks, rubs,  or gallops. Abdomen:  Soft, nontender and nondistended. Normal bowel sounds.   Neuro/Psych:  Alert and cooperative. Normal mood and affect. A and O x 3   '@Cattaleya Wien'$  Simonne Maffucci, MD, [facility]astro[facility] [phone] (pager) 05/28/2022 9:45 AM@

## 2022-05-28 NOTE — Anesthesia Procedure Notes (Signed)
Procedure Name: MAC Date/Time: 05/28/2022 10:42 AM  Performed by: West Pugh, CRNAPre-anesthesia Checklist: Patient identified, Emergency Drugs available, Suction available, Patient being monitored and Timeout performed Patient Re-evaluated:Patient Re-evaluated prior to induction Oxygen Delivery Method: Simple face mask Preoxygenation: Pre-oxygenation with 100% oxygen Placement Confirmation: positive ETCO2 Dental Injury: Teeth and Oropharynx as per pre-operative assessment

## 2022-05-28 NOTE — Discharge Instructions (Addendum)
I removed 5 small polyps and took biopsies. I did not see anything alarming.  I will let you know results and plans.  Follow the plans as per Dr. Yong Channel regarding Lovenox and warfarin therapy.  I appreciate the opportunity to care for you. Gatha Mayer, MD, FACG   YOU HAD AN ENDOSCOPIC PROCEDURE TODAY: Refer to the procedure report and other information in the discharge instructions given to you for any specific questions about what was found during the examination. If this information does not answer your questions, please call Dr. Celesta Aver office at [phone] to clarify.   YOU SHOULD EXPECT: Some feelings of bloating in the abdomen. Passage of more gas than usual. Walking can help get rid of the air that was put into your GI tract during the procedure and reduce the bloating. If you had a lower endoscopy (such as a colonoscopy or flexible sigmoidoscopy) you may notice spotting of blood in your stool or on the toilet paper. Some abdominal soreness may be present for a day or two, also.  DIET: Your first meal following the procedure should be a light meal and then it is ok to progress to your normal diet. A half-sandwich or bowl of soup is an example of a good first meal. Heavy or fried foods are harder to digest and may make you feel nauseous or bloated. Drink plenty of fluids but you should avoid alcoholic beverages for 24 hours.   ACTIVITY: Your care partner should take you home directly after the procedure. You should plan to take it easy, moving slowly for the rest of the day. You can resume normal activity the day after the procedure however YOU SHOULD NOT DRIVE, use power tools, machinery or perform tasks that involve climbing or major physical exertion for 24 hours (because of the sedation medicines used during the test).   SYMPTOMS TO REPORT IMMEDIATELY: A gastroenterologist can be reached at any hour. Please call [phone]  for any of the following symptoms:   Following upper  endoscopy (EGD, EUS, ERCP, esophageal dilation) Vomiting of blood or coffee ground material  New, significant abdominal pain  New, significant chest pain or pain under the shoulder blades  Painful or persistently difficult swallowing  New shortness of breath  Black, tarry-looking or red, bloody stools  FOLLOW UP:  If any biopsies were taken you will be contacted by phone or by letter within the next 1-3 weeks. Call [phone]  if you have not heard about the biopsies in 3 weeks.  Please also call with any specific questions about appointments or follow up tests.

## 2022-05-28 NOTE — Anesthesia Preprocedure Evaluation (Addendum)
Anesthesia Evaluation  Patient identified by MRN, date of birth, ID band Patient awake    Reviewed: Allergy & Precautions, NPO status , Patient's Chart, lab work & pertinent test results, reviewed documented beta blocker date and time   History of Anesthesia Complications Negative for: history of anesthetic complications  Airway Mallampati: III  TM Distance: <3 FB Neck ROM: Full    Dental  (+) Dental Advisory Given, Teeth Intact   Pulmonary  06/15/2020 SARS coronavirus NEG   breath sounds clear to auscultation       Cardiovascular hypertension, Pt. on medications and Pt. on home beta blockers (-) angina+ dysrhythmias Atrial Fibrillation  Rhythm:Regular Rate:Normal     Neuro/Psych PSYCHIATRIC DISORDERS Anxiety Depression negative neurological ROS     GI/Hepatic Neg liver ROS, GERD  Controlled and Medicated,Neuroendocrine tumor of stomach   Endo/Other  diabetesHypothyroidism lupus  Renal/GU Renal InsufficiencyRenal disease     Musculoskeletal  (+) Arthritis ,   Abdominal (+) + obese,   Peds  Hematology  (+) Blood dyscrasia, anemia , coumadin   Anesthesia Other Findings   Reproductive/Obstetrics                            Anesthesia Physical  Anesthesia Plan  ASA: 3  Anesthesia Plan: MAC   Post-op Pain Management: Minimal or no pain anticipated   Induction: Intravenous  PONV Risk Score and Plan: 2 and Propofol infusion  Airway Management Planned: Natural Airway and Simple Face Mask  Additional Equipment: None  Intra-op Plan:   Post-operative Plan:   Informed Consent: I have reviewed the patients History and Physical, chart, labs and discussed the procedure including the risks, benefits and alternatives for the proposed anesthesia with the patient or authorized representative who has indicated his/her understanding and acceptance.     Dental advisory given  Plan Discussed  with: CRNA and Anesthesiologist  Anesthesia Plan Comments:        Anesthesia Quick Evaluation

## 2022-05-28 NOTE — Op Note (Signed)
[facility] Patient Name: Erin Kirk Procedure Date: 05/28/2022 MRN: [id number] Attending MD: Gatha Mayer , MD Date of Birth: November 24, 1955 CSN: [id number] Age: 66 Admit Type: Outpatient Procedure:                Upper GI endoscopy Indications:              Surveillance procedure, Malignant carcinoid tumor                            of the stomach, Follow-up of malignant carcinoid                            tumor of the stomach, Follow-up of gastric polyps Providers:                Gatha Mayer, MD, Benay Pillow, RN, [facility]                            Technician, Technician Referring MD:             Gatha Mayer, MD Medicines:                Monitored Anesthesia Care Complications:            No immediate complications. Estimated Blood Loss:     Estimated blood loss was minimal. Procedure:                Pre-Anesthesia Assessment:                           - Prior to the procedure, a History and Physical                            was performed, and patient medications and                            allergies were reviewed. The patient's tolerance of                            previous anesthesia was also reviewed. The risks                            and benefits of the procedure and the sedation                            options and risks were discussed with the patient.                            All questions were answered, and informed consent                            was obtained. Prior Anticoagulants: The patient                            last took Coumadin (warfarin) 5 days and Lovenox                            (  enoxaparin) 1 day prior to the procedure. ASA                            Grade Assessment: III - A patient with severe                            systemic disease. After reviewing the risks and                            benefits, the patient was deemed in satisfactory                            condition to undergo the procedure.                            After obtaining informed consent, the endoscope was                            passed under direct vision. Throughout the                            procedure, the patient's blood pressure, pulse, and                            oxygen saturations were monitored continuously. The                            GIF-H190 ([id number]) Olympus endoscope was introduced                            through the mouth, and advanced to the second part                            of duodenum. The upper GI endoscopy was                            accomplished without difficulty. The patient                            tolerated the procedure well. Scope In: Scope Out: Findings:      Three 3 to 6 mm sessile polyps were found in the gastric fundus and in       the gastric body. The polyps were removed with snare endoscopic mucosal       resection technique. Resection and retrieval were complete. Verification       of patient identification for the specimen was done. Estimated blood       loss was minimal. To prevent bleeding after the polypectomy, three       hemostatic clips were successfully placed (MR conditional).      Two 1 to 2 mm sessile polyps were found in the gastric antrum. The       polyps were removed with a cold biopsy forceps. Resection and retrieval       were complete. Verification of patient identification for the specimen  was done. Estimated blood loss was minimal.      Diffuse moderate inflammation was found in the entire examined stomach.       Erythema + mottled white color changes and some granular subtle       nodularity. Biopsies were taken with a cold forceps for histology in       gastric mapping protocol. Verification of patient identification for the       specimen was done. Estimated blood loss was minimal.      Multiple gastric body clips from prior procedures seen also      The cardia and gastric fundus were otherwise normal on retroflexion.      The exam  was otherwise without abnormality. Impression:               - Three gastric polyps. Resected and retrieved. EMR                            Clips (MR conditional) were placed.                           - Two gastric polyps 1- 2 mm. Resected and                            retrieved. Cold biopsy                           - Chronic gastritis. Biopsied. Lesser curve,                            greater curve, incisura and cardia/funud mapping                            biopsies given prior intestinal metaplasia                           - Multiple persistent clips from prior polypectomies                           - The examination was otherwise normal. Moderate Sedation:      Not Applicable - Patient had care per Anesthesia. Recommendation:           - Patient has a contact number available for                            emergencies. The signs and symptoms of potential                            delayed complications were discussed with the                            patient. Return to normal activities tomorrow.                            Written discharge instructions were provided to the  patient.                           - Resume previous diet.                           - Continue present medications.                           - Resume Coumadin (warfarin) tomorrow and Lovenox                            (enoxaparin) tomorrow at prior doses. Refer to                            primary physician plans previously made - patient                            has instructions                           - Await pathology results. Procedure Code(s):        --- Professional ---                           [phone], Esophagogastroduodenoscopy, flexible,                            transoral; with endoscopic mucosal resection                           43239, 39, Esophagogastroduodenoscopy, flexible,                            transoral; with biopsy, single or multiple Diagnosis  Code(s):        --- Professional ---                           K31.7, Polyp of stomach and duodenum                           K29.50, Unspecified chronic gastritis without                            bleeding                           C7A.092, Malignant carcinoid tumor of the stomach CPT copyright 2019 [employer]. All rights reserved. The codes documented in this report are preliminary and upon coder review may  be revised to meet current compliance requirements. Gatha Mayer, MD 05/28/2022 11:37:32 AM This report has been signed electronically. Number of Addenda: 0

## 2022-05-28 NOTE — Progress Notes (Signed)
Some upper abd pain and lower chest pain - abdomen was benign, EKG A fib NL rate no ischemia Much better after flatus

## 2022-05-28 NOTE — Progress Notes (Signed)
Spoke with Dr. Ambrose Pancoast, patient is feeling better with pain level or 3-4 mid epigastric, expressing desire to go home.  Per Dr. Ambrose Pancoast may discharge patient home.

## 2022-05-28 NOTE — Anesthesia Postprocedure Evaluation (Signed)
Anesthesia Post Note  Patient: PEARLENE TEAT  Procedure(s) Performed: ESOPHAGOGASTRODUODENOSCOPY (EGD) WITH PROPOFOL BIOPSY HEMOSTASIS CLIP PLACEMENT SUBMUCOSAL LIFTING INJECTION POLYPECTOMY     Patient location during evaluation: PACU Anesthesia Type: MAC Level of consciousness: awake and alert Pain management: pain level controlled Vital Signs Assessment: post-procedure vital signs reviewed and stable Respiratory status: spontaneous breathing, nonlabored ventilation, respiratory function stable and patient connected to nasal cannula oxygen Cardiovascular status: stable and blood pressure returned to baseline Postop Assessment: no apparent nausea or vomiting Anesthetic complications: no   No notable events documented.  Last Vitals:  Vitals:   05/28/22 1219 05/28/22 1239  BP: 137/82 (!) 146/82  Pulse: 84 86  Resp: 16 15  Temp:    SpO2: 100% 100%    Last Pain:  Vitals:   05/28/22 1239  TempSrc:   PainSc: 3                  Gabriel Conry

## 2022-05-28 NOTE — Transfer of Care (Signed)
Immediate Anesthesia Transfer of Care Note  Patient: LAQUONDA WELBY  Procedure(s) Performed: ESOPHAGOGASTRODUODENOSCOPY (EGD) WITH PROPOFOL BIOPSY HEMOSTASIS CLIP PLACEMENT SUBMUCOSAL LIFTING INJECTION POLYPECTOMY  Patient Location: PACU and Endoscopy Unit  Anesthesia Type:MAC  Level of Consciousness: awake and patient cooperative  Airway & Oxygen Therapy: Patient Spontanous Breathing and Patient connected to face mask oxygen  Post-op Assessment: Report given to RN and Post -op Vital signs reviewed and stable  Post vital signs: Reviewed and stable  Last Vitals:  Vitals Value Taken Time  BP 122/71 05/28/22 1120  Temp    Pulse 80 05/28/22 1122  Resp 14 05/28/22 1122  SpO2 100 % 05/28/22 1122  Vitals shown include unvalidated device data.  Last Pain:  Vitals:   05/28/22 0950  TempSrc: Temporal  PainSc: 0-No pain         Complications: No notable events documented.

## 2022-05-29 ENCOUNTER — Encounter (HOSPITAL_COMMUNITY): Payer: Self-pay | Admitting: Internal Medicine

## 2022-05-29 ENCOUNTER — Telehealth: Payer: Self-pay | Admitting: Internal Medicine

## 2022-05-29 LAB — SURGICAL PATHOLOGY

## 2022-05-29 NOTE — Telephone Encounter (Signed)
Results of EGD biopsies called the patient today.  She has gastritis some focal intestinal metaplasia and hyperplastic polyps but no signs of cancer or carcinoid tumor.  Plan for a repeat in about 1 year perhaps extending to 2 years.   She had upper abdominal and chest discomfort after the procedure.  She was in atrial fibrillation.  She admitted that she felt that way slightly prior to the procedure though had not disclosed it.  After expelling a large amount of flatus she felt much better.  She had a similar more severe episode in 2020 with an EUS and EGD and polypectomy procedures.   She rides an exercise bike (recumbent) 2 hours most days.  She has not been experiencing these problems but then says "I can tell when I am in atrial fibrillation".  She has not seen a cardiologist in many years.  I asked her to discuss this with her primary care provider when she sees him again.  She is wondering if she needs additional evaluation though she is in no hurry to do so.  It seems to me that she probably had symptoms directly attributable to her procedures and interventions on the stomach, as opposed to a cardiac issue.

## 2022-06-01 NOTE — Telephone Encounter (Signed)
Ok perfect- I'm saving your note for next visit

## 2022-06-03 ENCOUNTER — Ambulatory Visit (INDEPENDENT_AMBULATORY_CARE_PROVIDER_SITE_OTHER): Payer: Medicare Other

## 2022-06-03 DIAGNOSIS — Z7901 Long term (current) use of anticoagulants: Secondary | ICD-10-CM

## 2022-06-03 LAB — POCT INR: INR: 2.1 (ref 2.0–3.0)

## 2022-06-03 NOTE — Patient Instructions (Addendum)
Pre visit review using our clinic review tool, if applicable. No additional management support is needed unless otherwise documented below in the visit note.  Increase dose today to 1 1/2 tablets. Increase dose tomorrow to 1 1/2 tablets.  Then resume taking 1 tablet daily except take 1/2 tablet on Mondays.  Recheck INR in 3 weeks.

## 2022-06-03 NOTE — Progress Notes (Signed)
I have reviewed and agree with note, evaluation, plan.   Hazell Siwik, MD  

## 2022-06-03 NOTE — Progress Notes (Signed)
Increase dose today to 1 1/2 tablets. Increase dose tomorrow to 1 1/2 tablets.  Then resume taking 1 tablet daily except take 1/2 tablet on Mondays.  Recheck INR in 3 weeks.

## 2022-06-04 ENCOUNTER — Other Ambulatory Visit: Payer: Self-pay | Admitting: Family Medicine

## 2022-06-11 ENCOUNTER — Other Ambulatory Visit: Payer: Self-pay | Admitting: Family Medicine

## 2022-06-12 ENCOUNTER — Telehealth: Payer: Self-pay

## 2022-06-12 NOTE — Telephone Encounter (Signed)
Incoming fax from [place] asking if they can change Generic Manufacturer to [place]? Cathlean Sauer is unavailable with no release date.

## 2022-06-12 NOTE — Telephone Encounter (Signed)
No problems in the past with pt changing manufacturers of warfarin.  Contacted [facility] and spoke to [employer], pharmacist, and advised ok to change manufacturers. Allise verbalized understanding.  Pt has INR check in approximately 2 weeks.

## 2022-06-15 ENCOUNTER — Ambulatory Visit (INDEPENDENT_AMBULATORY_CARE_PROVIDER_SITE_OTHER): Payer: Medicare Other | Admitting: Family

## 2022-06-15 ENCOUNTER — Encounter: Payer: Self-pay | Admitting: Family

## 2022-06-15 VITALS — BP 102/64 | HR 94 | Temp 98.0°F | Ht 64.0 in | Wt 191.0 lb

## 2022-06-15 DIAGNOSIS — J069 Acute upper respiratory infection, unspecified: Secondary | ICD-10-CM | POA: Diagnosis not present

## 2022-06-15 MED ORDER — BENZONATATE 200 MG PO CAPS: 200.0000 mg | ORAL_CAPSULE | Freq: Three times a day (TID) | ORAL | 0 refills | Status: AC | PRN

## 2022-06-15 NOTE — Patient Instructions (Signed)
It was very nice to see you today!   Look for Coricidin over the counter  for your cold/sinus symptoms, should be able to find with the other cold medicines, and take as directed on the package. This is safe for patients with heart problems.  I also recommend using  a saline nasal spray several times per day to help disinfect your sinuses. Just look for the pharmacy generic spray.  Drink at least 2 liters = 8 cups of water daily to thin your mucus.       PLEASE NOTE:  If you had any lab tests please let us know if you have not heard back within a few days. You may see your results on MyChart before we have a chance to review them but we will give you a call once they are reviewed by [place]. If we ordered any referrals today, please let us know if you have not heard from their office within the next week.

## 2022-06-15 NOTE — Progress Notes (Signed)
Patient ID: Erin Kirk, female    DOB: July 30, 1956, 66 y.o.   MRN: [id number]  Chief Complaint  Patient presents with   Sinus Problem    Pt c/o sore throat, nasal congestion/ drainage,headache, dry cough and facial pressure. Present since 9/23. Has tried tylenol which did not help with her headaches.     HPI:      Upper Respiratory Infection: Symptoms include congestion, nasal congestion, post nasal drip, sore throat, and nasal drainage, denies any fever .  Onset of symptoms was 2 days ago, gradually worsening since that time. She is drinking moderate amounts of fluids. Evaluation to date: none.  Treatment to date:  Tylenol . Negative covid test at home.   Assessment & Plan:  1. Viral upper respiratory tract infection Advised pt to start OTC Coricidin for sinus sx along with saline nasal spray tid. Drink plenty of fluids. Refilling Tessalon pearles, RTO if sx are not improving.  - benzonatate (TESSALON) 200 MG capsule; Take 1 capsule (200 mg total) by mouth 3 (three) times daily as needed for up to 10 days for cough.  Dispense: 30 capsule; Refill: 0  Subjective:    Outpatient Medications Prior to Visit  Medication Sig Dispense Refill   acetaminophen (TYLENOL) 500 MG tablet Take 500 mg by mouth every 6 (six) hours as needed (for pain.).      Alum Hydroxide-Mag Trisilicate (GAVISCON) 02-40.9 MG CHEW Chew 1-2 tablets by mouth 2 (two) times daily as needed (indigestion).     Cholecalciferol (VITAMIN D3) 50 MCG (2000 UT) TABS Take 2,000 Units by mouth daily.     dicyclomine (BENTYL) 20 MG tablet Take 1 tablet (20 mg total) by mouth every 6 (six) hours as needed for spasms (abdominal cramps, pain). 60 tablet 0   diltiazem (CARDIZEM CD) 360 MG 24 hr capsule Take 1 capsule by mouth at bedtime 90 capsule 0   docusate sodium (COLACE) 100 MG capsule Take 100 mg by mouth at bedtime. Stool softner     enoxaparin (LOVENOX) 120 MG/0.8ML injection Inject 0.8 mLs (120 mg total) into the skin daily.  PATIENT RECEIVED INSTRUCTIONS AT ANTICOAGULATION CLINIC 6.4 mL 0   ferrous sulfate 325 (65 FE) MG tablet Take 1 tablet (325 mg total) by mouth daily with breakfast.     levothyroxine (SYNTHROID) 100 MCG tablet Take 1 tablet by mouth once daily 90 tablet 0   lovastatin (MEVACOR) 10 MG tablet Take 1 tablet (10 mg total) by mouth once a week.     metoprolol succinate (TOPROL-XL) 25 MG 24 hr tablet Take 1 tablet by mouth once daily 90 tablet 0   omeprazole (PRILOSEC) 40 MG capsule Take 1 capsule (40 mg total) by mouth daily. 90 capsule 3   telmisartan (MICARDIS) 40 MG tablet Take 1 tablet by mouth once daily 90 tablet 0   triamterene-hydrochlorothiazide (MAXZIDE-25) 37.5-25 MG tablet Take 1 tablet by mouth once daily 90 tablet 0   vitamin B-12 (CYANOCOBALAMIN) 1000 MCG tablet Take 1,000 mcg by mouth daily.     warfarin (COUMADIN) 5 MG tablet TAKE 1 TABLET BY MOUTH DAILY EXCEPT TAKE 1/2 TABLET ON MONDAYS OR AS DIRECTED BY ANTICOAGULATION CLINIC (Patient taking differently: Take 2.5-5 mg by mouth See admin instructions. TAKE 1 TABLET BY MOUTH DAILY EXCEPT TAKE 1/2 TABLET ON MONDAYS OR AS DIRECTED BY ANTICOAGULATION CLINIC 2.5 mg on Mon, 5 mg all other days at 4p) 105 tablet 1   No facility-administered medications prior to visit.   Past Medical History:  Diagnosis Date   Allergy    Anxiety    Arthritis    Asthma    many years ago   Blood transfusion without reported diagnosis    Cataract    Chronic kidney disease    Clotting disorder ([place])    Depression    Husband died June 14, 2019   Diabetes ([place])    not on medication   DVT (deep venous thrombosis) (HCC)    left arm   Dysrhythmia    Family history of bone cancer    Family history of breast cancer    Family history of lung cancer    Family history of ovarian cancer    Family history of testicular cancer    Family history of uterine cancer    GERD (gastroesophageal reflux disease)    Headache    Hypertension    Hypothyroidism    Iron  deficiency anemia 03/30/2019   Irregular heart beat    tachycardia   Lupus anticoagulant with hypercoagulable state ([place])    Melanoma ([place])    2005- face    Paroxysmal atrial fibrillation (HCC)    Plantar fasciitis    Pneumonia 2019   Primary hypercoagulable state ([place])    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   Pulmonary embolism ([place])    Thyroid disease    hypothyroidism   Past Surgical History:  Procedure Laterality Date   ABDOMINAL HYSTERECTOMY     noncancerous   BACK SURGERY  1983   BIOPSY  06/19/2020   Procedure: BIOPSY;  Surgeon: Irving Copas., MD;  Location: [facility];  Service: Gastroenterology;;   BIOPSY  06/12/2021   Procedure: BIOPSY;  Surgeon: Irving Copas., MD;  [facility];  Service: Gastroenterology;;   BIOPSY  05/28/2022   Procedure: BIOPSY;  Surgeon: Gatha Mayer, MD;  Location: [facility];  Service: Gastroenterology;;   CHOLECYSTECTOMY     COLONOSCOPY     COLONOSCOPY WITH PROPOFOL N/A 06/12/2021   Procedure: COLONOSCOPY WITH PROPOFOL;  Surgeon: Irving Copas., MD;  Location: [facility] ENDOSCOPY;  Service: Gastroenterology;  Laterality: N/A;   ENDOSCOPIC MUCOSAL RESECTION N/A 07/12/2019   Procedure: ENDOSCOPIC MUCOSAL RESECTION;  Surgeon: Rush Landmark Telford Nab., MD;  Location: [place];  Service: Gastroenterology;  Laterality: N/A;   ENDOSCOPIC MUCOSAL RESECTION  06/19/2020   Procedure: ENDOSCOPIC MUCOSAL RESECTION;  Surgeon: Rush Landmark Telford Nab., MD;  Location: [facility];  Service: Gastroenterology;;   ESOPHAGOGASTRODUODENOSCOPY N/A 06/19/2020   Procedure: ESOPHAGOGASTRODUODENOSCOPY (EGD);  Surgeon: Irving Copas., MD;  Location: [facility];  Service: Gastroenterology;  Laterality: N/A;   ESOPHAGOGASTRODUODENOSCOPY (EGD) WITH PROPOFOL N/A 07/12/2019   Procedure: ESOPHAGOGASTRODUODENOSCOPY (EGD) WITH PROPOFOL;  Surgeon: Rush Landmark Telford Nab., MD;  Location:  [place];  Service: Gastroenterology;  Laterality: N/A;   ESOPHAGOGASTRODUODENOSCOPY (EGD) WITH PROPOFOL N/A 10/09/2019   Procedure: ESOPHAGOGASTRODUODENOSCOPY (EGD) WITH PROPOFOL;  Surgeon: Rush Landmark Telford Nab., MD;  Location: [place];  Service: Gastroenterology;  Laterality: N/A;   ESOPHAGOGASTRODUODENOSCOPY (EGD) WITH PROPOFOL N/A 06/12/2021   Procedure: ESOPHAGOGASTRODUODENOSCOPY (EGD) WITH PROPOFOL;  Surgeon: Rush Landmark Telford Nab., MD;  Location: [place];  Service: Gastroenterology;  Laterality: N/A;   ESOPHAGOGASTRODUODENOSCOPY (EGD) WITH PROPOFOL N/A 05/28/2022   Procedure: ESOPHAGOGASTRODUODENOSCOPY (EGD) WITH PROPOFOL;  Surgeon: Gatha Mayer, MD;  [facility];  Service: Gastroenterology;  Laterality: N/A;   EUS N/A 07/12/2019   Procedure: UPPER ENDOSCOPIC ULTRASOUND (EUS) RADIAL;  Surgeon: Irving Copas., MD;  Location: [place];  Service: Gastroenterology;  Laterality: N/A;   EUS N/A  10/09/2019   Procedure: UPPER ENDOSCOPIC ULTRASOUND (EUS) RADIAL;  Surgeon: Irving Copas., MD;  Location: [place];  Service: Gastroenterology;  Laterality: N/A;   fatty tumor breast Right    removed   FINE NEEDLE ASPIRATION  06/19/2020   Procedure: FINE NEEDLE ASPIRATION (FNA) LINEAR;  Surgeon: Irving Copas., MD;  Location: [facility];  Service: Gastroenterology;;   FOREIGN BODY REMOVAL  10/09/2019   Procedure: FOREIGN BODY REMOVAL;  Surgeon: Irving Copas., MD;  Location: [place];  Service: Gastroenterology;;   FOREIGN BODY REMOVAL  06/19/2020   Procedure: FOREIGN BODY REMOVAL;  Surgeon: Irving Copas., MD;  Location: [facility];  Service: Gastroenterology;;  polyp removal with net    HEMOSTASIS CLIP PLACEMENT  07/12/2019   Procedure: HEMOSTASIS CLIP PLACEMENT;  Surgeon: Irving Copas., MD;  Location: [place];  Service: Gastroenterology;;   HEMOSTASIS CLIP PLACEMENT  10/09/2019   Procedure: HEMOSTASIS  CLIP PLACEMENT;  Surgeon: Irving Copas., MD;  Location: [place];  Service: Gastroenterology;;   HEMOSTASIS CLIP PLACEMENT  06/19/2020   Procedure: HEMOSTASIS CLIP PLACEMENT;  Surgeon: Irving Copas., MD;  Location: [facility];  Service: Gastroenterology;;   HEMOSTASIS CLIP PLACEMENT  06/12/2021   Procedure: HEMOSTASIS CLIP PLACEMENT;  Surgeon: Irving Copas., MD;  Location: [place];  Service: Gastroenterology;;   HEMOSTASIS CLIP PLACEMENT  05/28/2022   Procedure: HEMOSTASIS CLIP PLACEMENT;  Surgeon: Gatha Mayer, MD;  Location: [facility];  Service: Gastroenterology;;   HEMOSTASIS CONTROL  07/12/2019   Procedure: HEMOSTASIS CONTROL;  Surgeon: Irving Copas., MD;  Location: [place];  Service: Gastroenterology;;   HYSTEROTOMY     KNEE ARTHROSCOPY Left    Lymp nodes Right 08/2004   MELANOMA EXCISION Right 2005   face    OOPHORECTOMY Bilateral    POLYPECTOMY  07/12/2019   Procedure: POLYPECTOMY;  Surgeon: Irving Copas., MD;  Location: [place];  Service: Gastroenterology;;   POLYPECTOMY  10/09/2019   Procedure: POLYPECTOMY;  Surgeon: Irving Copas., MD;  Location: [place];  Service: Gastroenterology;;   POLYPECTOMY  06/19/2020   Procedure: POLYPECTOMY;  Surgeon: Irving Copas., MD;  Location: [facility];  Service: Gastroenterology;;   POLYPECTOMY  06/12/2021   Procedure: POLYPECTOMY;  Surgeon: Irving Copas., MD;  Location: [place];  Service: Gastroenterology;;   POLYPECTOMY  05/28/2022   Procedure: POLYPECTOMY;  Surgeon: Gatha Mayer, MD;  Location: [facility];  Service: Gastroenterology;;   removal lymphnodes in right neck     SUBMUCOSAL LIFTING INJECTION  07/12/2019   Procedure: SUBMUCOSAL LIFTING INJECTION;  Surgeon: Irving Copas., MD;  Location: [place];  Service: Gastroenterology;;   SUBMUCOSAL LIFTING INJECTION  10/09/2019   Procedure: SUBMUCOSAL LIFTING INJECTION;   Surgeon: Irving Copas., MD;  Location: [place];  Service: Gastroenterology;;   SUBMUCOSAL LIFTING INJECTION  06/12/2021   Procedure: SUBMUCOSAL LIFTING INJECTION;  Surgeon: Irving Copas., MD;  Location: [place];  Service: Gastroenterology;;   SUBMUCOSAL LIFTING INJECTION  05/28/2022   Procedure: SUBMUCOSAL LIFTING INJECTION;  Surgeon: Gatha Mayer, MD;  Location: [facility];  Service: Gastroenterology;;   UPPER ESOPHAGEAL ENDOSCOPIC ULTRASOUND (EUS) N/A 06/19/2020   Procedure: UPPER ESOPHAGEAL ENDOSCOPIC ULTRASOUND (EUS);  Surgeon: Irving Copas., MD;  Location: [facility];  Service: Gastroenterology;  Laterality: N/A;   UPPER GASTROINTESTINAL ENDOSCOPY     Allergies  Allergen Reactions   Iohexol Anaphylaxis, Hives and Shortness Of Breath    Incident 1990, premedicated prior to obtaining IV contrast since.  Amoxicillin-Pot Clavulanate Swelling and Other (See Comments)    Facial swelling Did it involve swelling of the face/tongue/throat, SOB, or low BP? Yes Did it involve sudden or severe rash/hives, skin peeling, or any reaction on the inside of your mouth or nose? No Did you need to seek medical attention at a hospital or doctor's office? No When did it last happen?~7-8 years ago   If all above answers are "NO", may proceed with cephalosporin use.    Other     Contrast dye      Objective:    Physical Exam Vitals and nursing note reviewed.  Constitutional:      Appearance: Normal appearance.  HENT:     Right Ear: Tympanic membrane and ear canal normal.     Left Ear: Tympanic membrane and ear canal normal.     Nose:     Right Sinus: No maxillary sinus tenderness or frontal sinus tenderness.     Left Sinus: No maxillary sinus tenderness or frontal sinus tenderness.     Mouth/Throat:     Mouth: Mucous membranes are moist.     Pharynx: Posterior oropharyngeal erythema (mild) present. No pharyngeal swelling, oropharyngeal exudate or uvula  swelling.  Cardiovascular:     Rate and Rhythm: Normal rate and regular rhythm.  Pulmonary:     Effort: Pulmonary effort is normal.     Breath sounds: Normal breath sounds.  Musculoskeletal:        General: Normal range of motion.  Lymphadenopathy:     Head:     Right side of head: No preauricular or posterior auricular adenopathy.     Left side of head: No preauricular or posterior auricular adenopathy.     Cervical: No cervical adenopathy.  Skin:    General: Skin is warm and dry.  Neurological:     Mental Status: She is alert.  Psychiatric:        Mood and Affect: Mood normal.        Behavior: Behavior normal.    BP 102/64 (BP Location: Left Arm, Patient Position: Sitting, Cuff Size: Large)   Pulse 94   Temp 98 F (36.7 C) (Temporal)   Ht '5\' 4"'$  (1.626 m)   Wt 191 lb (86.6 kg)   LMP  (LMP Unknown)   SpO2 93%   BMI 32.79 kg/m  Wt Readings from Last 3 Encounters:  06/15/22 191 lb (86.6 kg)  05/28/22 193 lb (87.5 kg)  04/09/22 193 lb (87.5 kg)       Jeanie Sewer, NP

## 2022-06-24 ENCOUNTER — Ambulatory Visit (INDEPENDENT_AMBULATORY_CARE_PROVIDER_SITE_OTHER): Payer: Medicare Other

## 2022-06-24 DIAGNOSIS — Z7901 Long term (current) use of anticoagulants: Secondary | ICD-10-CM | POA: Diagnosis not present

## 2022-06-24 LAB — POCT INR: INR: 4.1 — AB (ref 2.0–3.0)

## 2022-06-24 NOTE — Progress Notes (Signed)
I have reviewed and agree with note, evaluation, plan.   Euclide Granito, MD  

## 2022-06-24 NOTE — Patient Instructions (Signed)
Hold dose today, then resume taking 1 tablet daily except take 1/2 tablet on Mondays.  Recheck INR in 3 weeks.

## 2022-06-24 NOTE — Progress Notes (Signed)
Hold dose today, then resume taking 1 tablet daily except take 1/2 tablet on Mondays.  Recheck INR in 3 weeks.

## 2022-06-26 ENCOUNTER — Telehealth: Payer: Self-pay | Admitting: Family Medicine

## 2022-06-26 ENCOUNTER — Encounter: Payer: Self-pay | Admitting: Internal Medicine

## 2022-06-26 ENCOUNTER — Ambulatory Visit (INDEPENDENT_AMBULATORY_CARE_PROVIDER_SITE_OTHER): Payer: Medicare Other | Admitting: Internal Medicine

## 2022-06-26 VITALS — BP 113/74 | HR 72 | Temp 98.5°F | Ht 64.0 in | Wt 194.4 lb

## 2022-06-26 DIAGNOSIS — N390 Urinary tract infection, site not specified: Secondary | ICD-10-CM

## 2022-06-26 DIAGNOSIS — R319 Hematuria, unspecified: Secondary | ICD-10-CM | POA: Diagnosis not present

## 2022-06-26 LAB — POCT URINALYSIS DIPSTICK
Bilirubin, UA: NEGATIVE
Glucose, UA: NEGATIVE
Ketones, UA: NEGATIVE
Nitrite, UA: NEGATIVE
Protein, UA: POSITIVE — AB
Spec Grav, UA: 1.015 (ref 1.010–1.025)
Urobilinogen, UA: 0.2 E.U./dL
pH, UA: 6 (ref 5.0–8.0)

## 2022-06-26 MED ORDER — CEPHALEXIN 500 MG PO CAPS: 500.0000 mg | ORAL_CAPSULE | Freq: Two times a day (BID) | ORAL | 0 refills | Status: AC

## 2022-06-26 MED ORDER — PHENAZOPYRIDINE HCL 100 MG PO TABS: 100.0000 mg | ORAL_TABLET | Freq: Three times a day (TID) | ORAL | Status: AC

## 2022-06-26 NOTE — Addendum Note (Signed)
Addended by: Loura Back on: 06/26/2022 05:03 PM   Modules accepted: Orders

## 2022-06-26 NOTE — Patient Instructions (Addendum)
It was a pleasure seeing you today!  Today the plan is...  Urinary tract infection with hematuria, site unspecified -     Cephalexin; Take 1 capsule (500 mg total) by mouth 2 (two) times daily for 7 days.  Dispense: 14 capsule; Refill: 0 -     Phenazopyridine HCl -     DG Abd 1 View; Future -     Urinalysis, Routine w reflex microscopic -     Urine Culture  Requested fluid to try to rinse out any infection or stone that may be in your urinary tract the type of fluid does not matter. Go to the emergency room if you get a high fever worsening right-sided flank pain or difficulty urinating or change in mental status like confusion.  Loralee Pacas, MD   No follow-ups on file.   - If your condition fails to resolve or you have other questions / concerns: please contact me via phone [phone] or MyChart messaging.  - Please bring all your medicines to your next appointment. This is the best way for me to know exactly what you're taking.  - If your condition begins to worsen or become severe:  go to the ER.   IF you received an x-ray today, you will receive an invoice from [facility]. Please contact [facility] at [phone] with questions or concerns regarding your invoice.    IF you received labwork today, you will receive an invoice from [facility]. Please contact [facility] at [phone] with questions or concerns regarding your invoice.    Our billing staff will not be able to assist you with questions regarding bills from these companies.   --------------------------------------------------------------------------------------------------------------------  You will be contacted with the lab results as soon as they are available. The fastest way to get your results is to activate your My Chart account. Instructions are located on the last page of this paperwork. If you have not heard from [place] regarding the results in 2 weeks, please contact this office. For any  labs or imaging tests, we will call you if the results are significantly abnormal.  Most normal results will be posted to myChart as soon as they are available and I will comment on them there within 2-3 business days.

## 2022-06-26 NOTE — Telephone Encounter (Signed)
Patient Name: Erin Kirk Gender: Female DOB: 1956-03-03 Age: 66 Y 12 M 6 D Return Phone Number: [phone] (Primary), [phone] (Secondary) Address: City/ State/ Zip: [place]  [address] Client [place] [facility] [place] [facility] [place] [facility] Provider Garret Reddish- MD Contact Type Call Who Is Calling Patient / Member / Family / Caregiver Call Type Triage / Clinical Relationship To Patient Self Return Phone Number [phone] (Primary) Chief Complaint Urine, Blood In Reason for Call Symptomatic / Request for [place] states she is experiencing urination pressure with blood in her urine. Translation No Nurse Assessment Nurse: Alveta Heimlich, RN, Rise Paganini Date/Time (Eastern Time): 06/26/2022 10:16:20 AM Confirm and document reason for call. If symptomatic, describe symptoms. ---Caller states she is on blood thinners. Last night she went to the bathroom and urinated, but did not look in the toliet. A little later she started feeling pressure in the pelvic area. She dribbled a little and saw blood in the toliet. This went on until 2 am. She is not having it so bad this morning. It does look a little pink and still having the pressure. Sometime she is able to urinate more than a few drops. No fever Does the patient have any new or worsening symptoms? ---Yes Will a triage be completed? ---Yes Related visit to physician within the last 2 weeks? ---No Does the PT have any chronic conditions? (i.e. diabetes, asthma, this includes High risk factors for pregnancy, etc.) ---Yes List chronic conditions. ---asthma, DM , off meds since 2017 Is this a behavioral health or substance abuse call? ---No Guidelines Guideline Title Affirmed Question Affirmed Notes Nurse Date/Time [name] Time) Urination Pain - Female Diabetes mellitus or weak immune system (e.g., HIV positive,  cancer Alveta Heimlich, RN, Rise Paganini 06/26/2022 10:20:18 AM  Guidelines Guideline Title Affirmed Question Affirmed Notes Nurse Date/Time [name] Time) chemo, splenectomy, organ transplant, chronic steroids) Disp. Time [name] Time) Disposition Final User 06/26/2022 10:22:41 AM See HCP within 4 Hours (or PCP triage) Yes Alveta Heimlich, RN, Rise Paganini Final Disposition 06/26/2022 10:22:41 AM See HCP within 4 Hours (or PCP triage) Yes Alveta Heimlich, RN, Ali Lowe Disagree/Comply Comply Caller Understands No PreDisposition Call Doctor Care Advice Given Per Guideline SEE HCP (OR PCP TRIAGE) WITHIN 4 HOURS: * IF OFFICE WILL BE OPEN: You need to be seen within the next 3 or 4 hours. Call your doctor (or NP/PA) now or as soon as the office opens. CALL BACK IF: * You become worse CARE ADVICE given per Urination Pain - Female (Adult) guideline. Comments User: Debby Bud, RN Date/Time [name] Time): 06/26/2022 10:28:10 AM She states she does not want an earlier appointment, althought her triage was see within next 4 hours for the disposition. I told her I am to call the backline to see if we can get appointment earlier for her , since her appointment is at 420p.. She says no she wants to go at 420 pm as scheduled and do not call for earlier appointment. She will be there at 420 pm and will call back if any worsening of condition before then. Referrals REFERRED TO PCP OFFICE

## 2022-06-26 NOTE — Progress Notes (Signed)
[place] at [employer]:  [phone]   Routine Medical Office Visit  Patient:  Erin Kirk      Age: 66 y.o.       Sex:  female  Date:   06/26/2022  PCP:    Marin Olp, MD    [place] Provider: Loralee Pacas, MD  Assessment/Plan:   [name] was seen today for hematuria and pelvic pressure.  Urinary tract infection with hematuria, site unspecified -     Cephalexin; Take 1 capsule (500 mg total) by mouth 2 (two) times daily for 7 days.  Dispense: 14 capsule; Refill: 0 -     Phenazopyridine HCl -     DG Abd 1 View; Future -     Urinalysis, Routine w reflex microscopic -     Urine Culture  Return if symptoms worsen or fail to improve.    Medication Discussion: Common side effects, risks, benefits, and alternatives for medications and treatment plan prescribed today were discussed, and she expressed understanding of the given instructions.  Medication list was reconciled and provided to the patient in the AVS. Patient instructions and summary information was reviewed with her as documented in the AVS.   Need to go to ER Discussion: We discussed Red Flag symptoms and signs in detail. She expressed understanding regarding what to do in case of urgent or emergency type symptoms.  She was advised to call the office or go to ER if her condition worsens.   Follow Up Treatment Plan Discussion: She is encouraged to call or message via MyChart if she has any questions or concerns regarding our treatment plan.  No barriers to understanding were identified Additional information was provided in the AVS (see AVS) regarding the diagnosis/treatment plan for her to review and this note is shared on her MyChart for review.    Subjective:   Erin Kirk is a 67 y.o. female with PMH significant for: Past Medical History:  Diagnosis Date   Allergy    Anxiety    Arthritis    Asthma    many years ago   Blood transfusion without reported diagnosis     Cataract    Chronic kidney disease    Clotting disorder (HCC)    Depression    Husband died 2019/06/08   Diabetes ([place])    not on medication   DVT (deep venous thrombosis) ([place])    left arm   Dysrhythmia    Family history of bone cancer    Family history of breast cancer    Family history of lung cancer    Family history of ovarian cancer    Family history of testicular cancer    Family history of uterine cancer    GERD (gastroesophageal reflux disease)    Headache    Hypertension    Hypothyroidism    Iron deficiency anemia 03/30/2019   Irregular heart beat    tachycardia   Lupus anticoagulant with hypercoagulable state ([place])    Melanoma ([place])    2005- face    Paroxysmal atrial fibrillation ([place])    Plantar fasciitis    Pneumonia 2019   Primary hypercoagulable state ([place])    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  Is a little pink when wipes but nothing in toilet when gets up. Feels as if she goes numb in her private after peeing, is there for a few mins and then goes away.    pelvic pressure     Additional physician collected history: She reports that it started suddenly last night that she was out having dinner and suddenly had severe urinary urgency and when she went to the bathroom there was a lot of pressure in her bladder area and there is even some pain when she peed and there was blood. She has no history of kidney stones but there is a little bit of pain down in the lower back on the right side She has no theories on what would have caused this urinary tract infection Had some blood in the urine in March that was found to not be infected and so she has not really have a history of a lot of urinary tract  infections having pain more in her back then             Objective:  Physical Exam: BP 113/74 (BP Location: Left Arm, Patient Position: Sitting)   Pulse 72   Temp 98.5 F (36.9 C) (Temporal)   Ht '5\' 4"'$  (1.626 m)   Wt 194 lb 6.4 oz (88.2 kg)   LMP  (LMP Unknown)   SpO2 97%   BMI 33.37 kg/m   She  is a polite, friendly, and genuine person Constitutional: NAD, AAO, not ill-appearing  Neuro: alert, no focal deficit obvious, articulate speech Psych: normal mood, behavior, thought content   Problem specific physical exam findings:  Suprapubic tender ness R cva tendernss  No images are attached to the encounter or orders placed in the encounter.    Results:  No results found for any visits on 06/26/22.   Recent Results (from the past 2160 hour(s))  POCT INR     Status: Abnormal   Collection Time: 04/01/22 12:00 AM  Result Value Ref Range   INR 3.1 (A) 2.0 - 3.0  POCT INR     Status: Abnormal   Collection Time: 05/13/22 12:00 AM  Result Value Ref Range   INR 3.3 (A) 2.0 - 3.0  Surgical pathology     Status: None   Collection Time: 05/28/22 10:55 AM  Result Value Ref Range   SURGICAL PATHOLOGY      SURGICAL PATHOLOGY CASE: [id number] PATIENT: Dillon Bathgate Surgical Pathology Report     Clinical History: Gastric polyps, malignant tumor of stomach (crm)     FINAL MICROSCOPIC DIAGNOSIS:  A. STOMACH, ANTRAL, POLYPECTOMY: Reactive gastropathy and mild chronic gastritis with foveolar hyperplasia compatible with hyperplastic polyp Negative for H. pylori, intestinal metaplasia, dysplasia and carcinoma  B. STOMACH, FUNDAL, PROXIMAL BODY, POLYPECTOMY: Hyperplastic polyp Reactive gastropathy with minimal chronic gastritis and foveolar hyperplasia Negative for H. pylori, intestinal metaplasia, dysplasia and carcinoma  C. STOMACH, BODY/ANTRUM INCISURA, BIOPSY: Reactive gastropathy and mild chronic gastritis Negative for H. pylori, intestinal metaplasia,  dysplasia and carcinoma  D. STOMACH, GREATER CURVATURE INCISURA, BIOPSY: Reactive gastropathy and mild chronic gastritis with foveolar hyperplasia Negative for H. pylori, intestinal metaplasia, dysplasia and  carcinoma  E. STOMACH, LESSER CURVATURE INCISURA, BIOPSY: Reactive gastropathy with mild chronic gastritis with foveolar hyperplasia Negative for H. pylori, intestinal metaplasia, dysplasia and carcinoma  F. STOMACH, CARDIAC FUNDUS INCISURA, BIOPSY: Chronic gastritis with foveolar hyperplasia and focal intestinal metaplasia Negative for H. pylori, dysplasia and carcinoma   GROSS DESCRIPTION:  A: Received in formalin are  tan, soft tissue fragments that are submitted in toto. Number: 2 size: 0.5 and 0.7 cm blocks: 1  B: Received in formalin are tan, soft tissue fragments that are submitted in toto. Number: 7 size: 0.2-0.7 cm blocks: 1  C: Received in formalin are tan, soft tissue fragments that are submitted in toto. Number: 4 size: 0.2-0.5 cm blocks: 1  D: Received in formalin are tan, soft tissue fragments that are submitted in toto. Number: 5 size: 0.3-0.6 cm blocks: 1  E: Received in formalin are tan, soft tissue fragments that are submitted in toto. Numbe r: 2 size: 0.4 and 0.5 cm blocks: 1  F: Received in formalin are tan, soft tissue fragments that are submitted in toto. Number: 3 size: 0.2-0.3 cm blocks: 1 (GRP 05/28/2022)   Final Diagnosis performed by Tobin Chad, MD.   Electronically signed 05/29/2022 Technical component performed at [facility], [place] [address] [place], [place] [address].  Professional component performed at [employer]. [facility], [place] [address] [place], [place] [address].  Immunohistochemistry Technical component (if applicable) was performed at [facility]. [address], [place], [place], [place] [address].   IMMUNOHISTOCHEMISTRY DISCLAIMER (if applicable): Some of these  immunohistochemical stains may have been developed and the performance characteristics determine by [facility]. Some may not have been cleared or approved by the U.S. Food and Drug Administration. The FDA has determined that such clearance or approval is not nec essary. This test is used for clinical purposes. It should not be regarded as investigational or for research. This laboratory is certified under the [employer] (CLIA-88) as qualified to perform high complexity clinical laboratory testing.  The controls stained appropriately.   POCT INR     Status: None   Collection Time: 06/03/22 12:00 AM  Result Value Ref Range   INR 2.1 2.0 - 3.0  POCT INR     Status: Abnormal   Collection Time: 06/24/22 12:00 AM  Result Value Ref Range   INR 4.1 (A) 2.0 - 3.0

## 2022-06-26 NOTE — Telephone Encounter (Signed)
Patient states: -Last night at dinner felt pressure similar to having to pee. Pee'd and noticed nothing strange.  - Upon coming home she felt pressure gain upon sitting. Went to pee and noticed blood on tissue and in toilet  - Blood in urine and pressure lasted until about 2am.  - Woke up in morning and no blood in urine or pressure present  -  Took shower and moved around, pressure and blood came back  - Feels like could be kidney stone  Patient has been transferred to triage.

## 2022-06-26 NOTE — Telephone Encounter (Signed)
Patient was transferred back via nurse line.   Patient states: - She does not want to wait for a triage nurse, she wants to see a doctor today  - This issue is just as important and needs to be addressed by PCP or another doctor  I tried to inform patient that triage is helpful for determining if an ED visit, with more advanced equipment, is needed. Pt declined further explanation.   Upon asking PCP nurse, Bevelyn Ngo for recommendations I was informed to get patient scheduled with either PCP or another provider.   Pt has been scheduled with Dr. Randol Kern @ 4:20pm on 10/06.

## 2022-06-26 NOTE — Addendum Note (Signed)
Addended by: Loralee Pacas on: 06/26/2022 05:00 PM   Modules accepted: Orders

## 2022-06-28 LAB — UNLABELED: Test Ordered On Req: 7909

## 2022-06-29 ENCOUNTER — Ambulatory Visit (INDEPENDENT_AMBULATORY_CARE_PROVIDER_SITE_OTHER)
Admission: RE | Admit: 2022-06-29 | Discharge: 2022-06-29 | Disposition: A | Discharge status: Home / Self Care | Payer: Medicare Other | Source: Ambulatory Visit | Attending: Internal Medicine | Admitting: Internal Medicine

## 2022-06-29 DIAGNOSIS — R319 Hematuria, unspecified: Secondary | ICD-10-CM

## 2022-06-29 DIAGNOSIS — R109 Unspecified abdominal pain: Secondary | ICD-10-CM | POA: Diagnosis not present

## 2022-06-29 DIAGNOSIS — N390 Urinary tract infection, site not specified: Secondary | ICD-10-CM | POA: Diagnosis not present

## 2022-06-29 NOTE — Progress Notes (Signed)
Let patient know we have culture results and I'm not sure if keflex will do the trick.  If she still has symptoms, we should call in nitrofurtantoin for her.

## 2022-06-30 LAB — URINE CULTURE
MICRO NUMBER:: 14018700
SPECIMEN QUALITY:: ADEQUATE

## 2022-06-30 LAB — URINALYSIS, ROUTINE W REFLEX MICROSCOPIC
Bacteria, UA: NONE SEEN /HPF
Bilirubin Urine: NEGATIVE
Glucose, UA: NEGATIVE
Hyaline Cast: NONE SEEN /LPF
Ketones, ur: NEGATIVE
Nitrite: NEGATIVE
Specific Gravity, Urine: 1.016 (ref 1.001–1.035)
Squamous Epithelial / HPF: NONE SEEN /HPF (ref <=5)
pH: 6.5 (ref 5.0–8.0)

## 2022-06-30 LAB — TEST AUTHORIZATION

## 2022-06-30 LAB — PAT ID TIQ DOC: Test Affected: 7909

## 2022-06-30 LAB — MICROSCOPIC MESSAGE

## 2022-06-30 NOTE — Progress Notes (Signed)
Let patient know I reviewed her xray which saw no stones. Xray can miss kidney stones but this combined with clinical exam makes kidney stone unlikely.  If it clears up with keflex you are all better.  I suggest going to ER for CT stone protocol if you get bad pain, coming back for another urinalysis if you have persistent mild symptoms, and consider yourself cured if symptoms resolved.  Loralee Pacas, MD  06/30/2022 5:28 PM

## 2022-07-02 ENCOUNTER — Telehealth: Payer: Self-pay | Admitting: Family Medicine

## 2022-07-02 NOTE — Telephone Encounter (Signed)
Patient requests to be called at ph# [phone] to be given the results from lab work and Xray (done on 06/29/22)

## 2022-07-08 NOTE — Telephone Encounter (Signed)
Erin Kirk, can you f/u with pt please? Pt was seen by Randol Kern on 10/06.

## 2022-07-08 NOTE — Telephone Encounter (Signed)
Has Patient been notified of labs results? Please advise.

## 2022-07-10 ENCOUNTER — Inpatient Hospital Stay: Payer: Medicare Other | Attending: Oncology

## 2022-07-10 DIAGNOSIS — Z803 Family history of malignant neoplasm of breast: Secondary | ICD-10-CM | POA: Insufficient documentation

## 2022-07-10 DIAGNOSIS — D6861 Antiphospholipid syndrome: Secondary | ICD-10-CM | POA: Diagnosis not present

## 2022-07-10 DIAGNOSIS — Z7901 Long term (current) use of anticoagulants: Secondary | ICD-10-CM | POA: Insufficient documentation

## 2022-07-10 DIAGNOSIS — Z85820 Personal history of malignant melanoma of skin: Secondary | ICD-10-CM | POA: Insufficient documentation

## 2022-07-10 DIAGNOSIS — C7A092 Malignant carcinoid tumor of the stomach: Secondary | ICD-10-CM | POA: Diagnosis not present

## 2022-07-10 DIAGNOSIS — K869 Disease of pancreas, unspecified: Secondary | ICD-10-CM | POA: Insufficient documentation

## 2022-07-10 DIAGNOSIS — E119 Type 2 diabetes mellitus without complications: Secondary | ICD-10-CM | POA: Insufficient documentation

## 2022-07-10 DIAGNOSIS — Z8049 Family history of malignant neoplasm of other genital organs: Secondary | ICD-10-CM | POA: Diagnosis not present

## 2022-07-10 DIAGNOSIS — E039 Hypothyroidism, unspecified: Secondary | ICD-10-CM | POA: Diagnosis not present

## 2022-07-10 NOTE — Telephone Encounter (Signed)
LVM for pt to return call

## 2022-07-13 ENCOUNTER — Ambulatory Visit (INDEPENDENT_AMBULATORY_CARE_PROVIDER_SITE_OTHER): Payer: Medicare Other | Admitting: Internal Medicine

## 2022-07-13 ENCOUNTER — Encounter: Payer: Self-pay | Admitting: Internal Medicine

## 2022-07-13 VITALS — BP 117/72 | HR 81 | Temp 98.5°F | Resp 12 | Ht 64.0 in | Wt 196.2 lb

## 2022-07-13 DIAGNOSIS — N390 Urinary tract infection, site not specified: Secondary | ICD-10-CM | POA: Diagnosis not present

## 2022-07-13 LAB — POCT URINALYSIS DIPSTICK
Bilirubin, UA: NEGATIVE
Glucose, UA: NEGATIVE
Ketones, UA: NEGATIVE
Nitrite, UA: NEGATIVE
Protein, UA: POSITIVE — AB
Spec Grav, UA: 1.015 (ref 1.010–1.025)
Urobilinogen, UA: 0.2 E.U./dL
pH, UA: 7.5 (ref 5.0–8.0)

## 2022-07-13 LAB — CHROMOGRANIN A: Chromogranin A (ng/mL): 1120 ng/mL — ABNORMAL HIGH (ref 0.0–101.8)

## 2022-07-13 MED ORDER — NITROFURANTOIN MONOHYD MACRO 100 MG PO CAPS: 100.0000 mg | ORAL_CAPSULE | Freq: Two times a day (BID) | ORAL | 0 refills | Status: DC

## 2022-07-13 MED ORDER — PHENAZOPYRIDINE HCL 100 MG PO TABS: 100.0000 mg | ORAL_TABLET | Freq: Three times a day (TID) | ORAL | 0 refills | Status: DC | PRN

## 2022-07-13 NOTE — Patient Instructions (Addendum)
It was a pleasure seeing you today!  Today the plan is... Take the new antibiotic let me know or Dr. Yong Channel to know if it is not working right away because might have to switch to a stronger 1.  Also try to reduce any sort of rubbing washing or cleaning or drying of the area around your urethral orifice as a permanent change to your hygiene strategy.  Recurrent UTI -     Nitrofurantoin Monohyd Macro; Take 1 capsule (100 mg total) by mouth 2 (two) times daily.  Dispense: 20 capsule; Refill: 0 -     Phenazopyridine HCl; Take 1 tablet (100 mg total) by mouth 3 (three) times daily as needed for pain.  Dispense: 10 tablet; Refill: 0 -     POCT urinalysis dipstick -     Urine Culture     Erin Pacas, MD   Return if symptoms worsen or fail to improve.   - If your condition fails to resolve or you have other questions / concerns: please contact me via phone [phone] or MyChart messaging.  - Please bring all your medicines to your next appointment. This is the best way for me to know exactly what you're taking.  - If your condition begins to worsen or become severe:  go to the ER.   IF you received an x-ray today, you will receive an invoice from [facility]. Please contact [facility] at [phone] with questions or concerns regarding your invoice.    IF you received labwork today, you will receive an invoice from [facility]. Please contact [facility] at [phone] with questions or concerns regarding your invoice.    Our billing staff will not be able to assist you with questions regarding bills from these companies.   --------------------------------------------------------------------------------------------------------------------  You will be contacted with the lab results as soon as they are available. The fastest way to get your results is to activate your My Chart account. Instructions are located on the last page of this paperwork. If you have not heard from  [place] regarding the results in 2 weeks, please contact this office. For any labs or imaging tests, we will call you if the results are significantly abnormal.  Most normal results will be posted to myChart as soon as they are available and I will comment on them there within 2-3 business days.     Urinary Tract Infection, Adult  A urinary tract infection (UTI) is an infection of any part of the urinary tract. The urinary tract includes the kidneys, ureters, bladder, and urethra. These organs make, store, and get rid of urine in the body. An upper UTI affects the ureters and kidneys. A lower UTI affects the bladder and urethra. What are the causes? Most urinary tract infections are caused by bacteria in your genital area around your urethra, where urine leaves your body. These bacteria grow and cause inflammation of your urinary tract. What increases the risk? You are more likely to develop this condition if: You have a urinary catheter that stays in place. You are not able to control when you urinate or have a bowel movement (incontinence). You are female and you: Use a spermicide or diaphragm for birth control. Have low estrogen levels. Are pregnant. You have certain genes that increase your risk. You are sexually active. You take antibiotic medicines. You have a condition that causes your flow of urine to slow down, such as: An enlarged prostate, if you are female. Blockage in your urethra. A kidney  stone. A nerve condition that affects your bladder control (neurogenic bladder). Not getting enough to drink, or not urinating often. You have certain medical conditions, such as: Diabetes. A weak disease-fighting system (immunesystem). Sickle cell disease. Gout. Spinal cord injury. What are the signs or symptoms? Symptoms of this condition include: Needing to urinate right away (urgency). Frequent urination. This may include small amounts of urine each time you urinate. Pain or burning  with urination. Blood in the urine. Urine that smells bad or unusual. Trouble urinating. Cloudy urine. Vaginal discharge, if you are female. Pain in the abdomen or the lower back. You may also have: Vomiting or a decreased appetite. Confusion. Irritability or tiredness. A fever or chills. Diarrhea. The first symptom in older adults may be confusion. In some cases, they may not have any symptoms until the infection has worsened. How is this diagnosed? This condition is diagnosed based on your medical history and a physical exam. You may also have other tests, including: Urine tests. Blood tests. Tests for STIs (sexually transmitted infections). If you have had more than one UTI, a cystoscopy or imaging studies may be done to determine the cause of the infections. How is this treated? Treatment for this condition includes: Antibiotic medicine. Over-the-counter medicines to treat discomfort. Drinking enough water to stay hydrated. If you have frequent infections or have other conditions such as a kidney stone, you may need to see a health care provider who specializes in the urinary tract (urologist). In rare cases, urinary tract infections can cause sepsis. Sepsis is a life-threatening condition that occurs when the body responds to an infection. Sepsis is treated in the hospital with IV antibiotics, fluids, and other medicines. Follow these instructions at home:  Medicines Take over-the-counter and prescription medicines only as told by your health care provider. If you were prescribed an antibiotic medicine, take it as told by your health care provider. Do not stop using the antibiotic even if you start to feel better. General instructions Make sure you: Empty your bladder often and completely. Do not hold urine for long periods of time. Empty your bladder after sex. Wipe from front to back after urinating or having a bowel movement if you are female. Use each tissue only one time  when you wipe. Drink enough fluid to keep your urine pale yellow. Keep all follow-up visits. This is important. Contact a health care provider if: Your symptoms do not get better after 1-2 days. Your symptoms go away and then return. Get help right away if: You have severe pain in your back or your lower abdomen. You have a fever or chills. You have nausea or vomiting. Summary A urinary tract infection (UTI) is an infection of any part of the urinary tract, which includes the kidneys, ureters, bladder, and urethra. Most urinary tract infections are caused by bacteria in your genital area. Treatment for this condition often includes antibiotic medicines. If you were prescribed an antibiotic medicine, take it as told by your health care provider. Do not stop using the antibiotic even if you start to feel better. Keep all follow-up visits. This is important. This information is not intended to replace advice given to you by your health care provider. Make sure you discuss any questions you have with your health care provider. Document Revised: 04/19/2020 Document Reviewed: 04/19/2020 Elsevier Patient Education  [employer].

## 2022-07-13 NOTE — Telephone Encounter (Signed)
Patient returned call. Requests to be called at ph# [phone].

## 2022-07-13 NOTE — Progress Notes (Signed)
[place] at [employer]:  [phone]   Routine Medical Office Visit  Patient:  Erin Kirk      Age: 66 y.o.       Sex:  female  Date:   07/13/2022  PCP:    Marin Olp, [place] Provider: Loralee Pacas, MD  Assessment/Plan:   Aslan was seen today for hematuria, pelvic pressure and urinary frequency.  Recurrent UTI -     Nitrofurantoin Monohyd Macro; Take 1 capsule (100 mg total) by mouth 2 (two) times daily.  Dispense: 20 capsule; Refill: 0 -     Phenazopyridine HCl; Take 1 tablet (100 mg total) by mouth 3 (three) times daily as needed for pain.  Dispense: 10 tablet; Refill: 0 -     POCT urinalysis dipstick -     Urine Culture  Due to the recurrent nature of this UTI associated with diarrhea I advised her to stop scrubbing intravaginally  Today's key discussion points - also in After Visit Summary (AVS) Common side effects, risks, benefits, and alternatives for medications and treatment plan prescribed today were discussed, and she expressed understanding of the given instructions.  Medication list was reconciled and patient instructions and summary information was documented and made available for her to review in the AVS (see AVS).  This note is also available to patient for review for accuracy and understanding. She was encouraged to contact our office by phone or message via MyChart if she has any questions or concerns regarding our treatment plan (see AVS).  No barriers to understanding were identified We discussed red flag symptoms and signs in detail and when to call the office or go to ER if her condition worsens (see AFTER VISIT SUMMARY).. She expressed understanding.    Subjective:   Erin Kirk is a 66 y.o. female with PMH significant for: Past Medical History:  Diagnosis Date   Allergy    Anxiety    Arthritis    Asthma    many years ago   Blood transfusion without reported diagnosis    Cataract    Chronic kidney  disease    Clotting disorder ([place])    Depression    Husband died Jun 23, 2019   Diabetes ([place])    not on medication   DVT (deep venous thrombosis) ([place])    left arm   Dysrhythmia    Family history of bone cancer    Family history of breast cancer    Family history of lung cancer    Family history of ovarian cancer    Family history of testicular cancer    Family history of uterine cancer    GERD (gastroesophageal reflux disease)    Headache    Hypertension    Hypothyroidism    Iron deficiency anemia 03/30/2019   Irregular heart beat    tachycardia   Lupus anticoagulant with hypercoagulable state ([place])    Melanoma ([place])    2005- face    Paroxysmal atrial fibrillation ([place])    Plantar fasciitis    Pneumonia 2019   Primary hypercoagulable state ([place])    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   Pelvic pressure    Intermittent for about a week.  More so on right side than left. Was fine for a week and then the symptoms resumed. Feels numb and tingling (inside vagina) after using restroom.    Urinary Frequency     Additional physician collected history: See Assessment/Plan section for per problem updates to history (overview and a/p subsections) as reported by patient today. 3 nights ago, severe nocturia 7-8x with pinkish suspect blood on urine wipes but no blood in commode Last night it improved but still 3-5 x nocturia  The week prior she felt like her symptoms were resolved.  She had just completed a course of Keflex of 7 days and she says she felt like symptoms were mostly resolved by 3 days in She has  a lot of pressure in her bladder area Hurts a little to pee now Denies any fevers or flank pain She reports she does have a lot of  issues with diarrhea and food going through her and she does have to wash her groin more frequently as a result        Objective:  Physical Exam: BP 117/72 (BP Location: Left Arm, Patient Position: Sitting)   Pulse 81   Temp 98.5 F (36.9 C) (Temporal)   Resp 12   Ht '5\' 4"'$  (1.626 m)   Wt 196 lb 3.2 oz (89 kg)   LMP  (LMP Unknown)   SpO2 94%   BMI 33.68 kg/m   She is a polite, friendly, and genuine person Constitutional: NAD, AAO, not ill-appearing  Neuro: alert, no focal deficit obvious, articulate speech Psych: normal mood, behavior, thought content   Problem specific physical exam findings:  No cva tenderness Appears tired but is alert  Results for orders placed or performed in visit on 07/13/22  POCT urinalysis dipstick  Result Value Ref Range   Color, UA yellow    Clarity, UA clear    Glucose, UA Negative Negative   Bilirubin, UA Negative    Ketones, UA Negative    Spec Grav, UA 1.015 1.010 - 1.025   Blood, UA 3+    pH, UA 7.5 5.0 - 8.0   Protein, UA Positive (A) Negative   Urobilinogen, UA 0.2 0.2 or 1.0 E.U./dL   Nitrite, UA Negative    Leukocytes, UA Large (3+) (A) Negative   Appearance     Odor

## 2022-07-15 ENCOUNTER — Ambulatory Visit (INDEPENDENT_AMBULATORY_CARE_PROVIDER_SITE_OTHER): Payer: Medicare Other

## 2022-07-15 DIAGNOSIS — Z7901 Long term (current) use of anticoagulants: Secondary | ICD-10-CM

## 2022-07-15 LAB — POCT INR: INR: 3.2 — AB (ref 2.0–3.0)

## 2022-07-15 NOTE — Patient Instructions (Addendum)
Continue 1 tablet daily except take 1/2 tablet on Mondays.  Recheck INR in 4 weeks.

## 2022-07-15 NOTE — Progress Notes (Signed)
Pt was prescribed Macrobid 100 mg twice daily on 10/23 for recurrent UTI.    Continue 1 tablet daily except take 1/2 tablet on Mondays.  Recheck INR in 4 weeks.

## 2022-07-16 LAB — URINE CULTURE
MICRO NUMBER:: 14087012
SPECIMEN QUALITY:: ADEQUATE

## 2022-07-16 NOTE — Progress Notes (Signed)
Let her know her urine grew e.coli.  see and see how the med is working

## 2022-07-17 ENCOUNTER — Inpatient Hospital Stay: Payer: Medicare Other | Admitting: Oncology

## 2022-07-17 VITALS — BP 125/82 | HR 100 | Temp 98.0°F | Resp 18 | Ht 64.0 in | Wt 195.2 lb

## 2022-07-17 DIAGNOSIS — K869 Disease of pancreas, unspecified: Secondary | ICD-10-CM | POA: Diagnosis not present

## 2022-07-17 DIAGNOSIS — E119 Type 2 diabetes mellitus without complications: Secondary | ICD-10-CM | POA: Diagnosis not present

## 2022-07-17 DIAGNOSIS — C7A092 Malignant carcinoid tumor of the stomach: Secondary | ICD-10-CM | POA: Diagnosis not present

## 2022-07-17 DIAGNOSIS — Z803 Family history of malignant neoplasm of breast: Secondary | ICD-10-CM | POA: Diagnosis not present

## 2022-07-17 DIAGNOSIS — Z85820 Personal history of malignant melanoma of skin: Secondary | ICD-10-CM | POA: Diagnosis not present

## 2022-07-17 DIAGNOSIS — Z7901 Long term (current) use of anticoagulants: Secondary | ICD-10-CM | POA: Diagnosis not present

## 2022-07-17 DIAGNOSIS — E039 Hypothyroidism, unspecified: Secondary | ICD-10-CM | POA: Diagnosis not present

## 2022-07-17 DIAGNOSIS — D6861 Antiphospholipid syndrome: Secondary | ICD-10-CM | POA: Diagnosis not present

## 2022-07-17 NOTE — Progress Notes (Signed)
[place] OFFICE PROGRESS NOTE   Diagnosis: Carcinoid tumor  INTERVAL HISTORY:   Erin Kirk returns as scheduled.  She is currently being treated for a urinary tract infection.  Her symptoms have improved.  She reports frequency and bladder pressure.  She had a urinary tract infection earlier in October. No fever.  She has intermittent diarrhea, up to 4-5 times per day. She underwent an upper endoscopy Dr. Carlean Purl in September.  Multiple polyps were removed from the stomach.  Objective:  Vital signs in last 24 hours:  Blood pressure 125/82, pulse 100, temperature 98 F (36.7 C), resp. rate 18, height '5\' 4"'$  (1.626 m), weight 195 lb 3.2 oz (88.5 kg).    Lymphatics: No cervical, supraclavicular, axillary, or inguinal nodes Resp: Lungs clear bilaterally Cardio: Regular rate and rhythm GI: No mass, no hepatosplenomegaly, mild tenderness in the left lower abdomen Vascular: No leg edema     Lab Results:  Lab Results  Component Value Date   WBC 5.5 03/17/2022   HGB 13.8 03/17/2022   HCT 40.6 03/17/2022   MCV 89.1 03/17/2022   PLT 185.0 03/17/2022   NEUTROABS 3.3 03/17/2022    CMP  Lab Results  Component Value Date   NA 137 03/17/2022   K 4.0 03/17/2022   CL 100 03/17/2022   CO2 30 03/17/2022   GLUCOSE 117 (H) 03/17/2022   BUN 22 03/17/2022   CREATININE 1.21 (H) 03/17/2022   CALCIUM 9.7 03/17/2022   PROT 7.2 03/17/2022   ALBUMIN 4.7 03/17/2022   AST 13 03/17/2022   ALT 17 03/17/2022   ALKPHOS 59 03/17/2022   BILITOT 0.9 03/17/2022   GFRNONAA 49 (L) 08/19/2020   GFRAA 57 (L) 08/19/2020    Medications: I have reviewed the patient's current medications.   Assessment/Plan: Multiple gastric carcinoid tumors Upper endoscopy 05/05/2019 with resection of multiple gastric carcinoid tumors EUS 07/12/2019-resection of multiple gastric carcinoid tumors, positive resection margin at the gastric cardia polypectomy site, tubular adenomas and hyperplastic  polyps removed from the lesser curvature Elevated chromogranin A level CT abdomen/pelvis 05/18/2019-no evidence of a gastric mass or metastatic disease Netspot 08/11/2019-focal activity corresponding to a 2-3 mm lesion in the mid pancreas, diffuse activity in the stomach EGD 10/09/2019- 3 areas of scar/clip/polypoid tissue removed with negative pathology EUS 10/09/2019-4 mm pancreas body lesion, FNA biopsy negative, no adenopathy EUS 06/19/2020-no evidence of recurrent tumor in areas of gastric body scars, nodularity was noted underneath 2 endoclips in the greater curvature, this area was biopsied and polyps were removed from the middle gastric body and proximal gastric antrum, biopsies from erosions and erythema were taken at the greater curvature, lesser curvature, and antrum.  An irregular lesion was identified in the pancreas body measuring 3.4 x 4.6 mm, the pancreas lesion was biopsied.  The pathology from the pancreas FNA was negative, the pathology from multiple biopsies including the gastric body scar, antrum polyp, greater curvature biopsy, lesser curvature biopsy, and gastric cardia biopsy revealed low-grade neuroendocrine tumor Netspot 04/14/2021-no residual avid tumor in the stomach, no evidence of tracer avid metastatic disease, decrease in tracer activity associated with the pancreas lesion Upper endoscopy 06/12/2021-5 gastric polyps resected and retrieved-hyperplastic polyp with well differentiated neuroendocrine tumor, stomach cardia and fundus biopsy-reactive gastropathy with neuroendocrine dysplasia, lesser curvature biopsy-reactive gastropathy with neuroendocrine hyperplasia Upper endoscopy 05/28/2022-multiple polyps removed from the stomach and areas of chronic gastritis were biopsied-hyperplastic polyps, reactive gastropathy, mild chronic gastritis, negative for H. pylori and carcinoma History of iron deficiency anemia secondary  bleeding from #1 and maintained on anticoagulation  therapy Hypercoagulation syndrome, antiphospholipid syndrome-maintained on Coumadin Diabetes Abdominal pain-potentially related to #1 6.   Hypothyroidism 7.  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  Family history of uterine and breast cancer 9.  Gastric and colonic adenomas Colonoscopy 06/12/2021-polyps removed from the cecum, ascending colon, and descending colon-tubular adenomas and hyperplastic polyp 10.  Hypodense pancreas lesion 05/18/2019 -positive Netspot uptake 08/11/2019, negative FNA biopsy       Disposition: [name] appears stable.  There is no clinical evidence of progressive carcinoid tumor.  The chromogranin a was lightly higher on 07/10/2022.  The plan is to continue observation.  She will be scheduled for a Netspot PET and office visit in 6 months.  She will again hold the Prilosec prior to checking the chromogranin level.  She will continue endoscopic follow-up with gastroenterology for the gastric polyps and pancreas lesion.  Erin Kirk, Erin Kirk  07/17/2022  12:16 PM

## 2022-07-20 ENCOUNTER — Other Ambulatory Visit: Payer: Self-pay | Admitting: Family Medicine

## 2022-08-03 NOTE — Telephone Encounter (Signed)
Spoke with patient and she is feeling better. Looks like Hasna gave her lab results on 10/26.

## 2022-08-12 ENCOUNTER — Ambulatory Visit (INDEPENDENT_AMBULATORY_CARE_PROVIDER_SITE_OTHER): Payer: Medicare Other

## 2022-08-12 DIAGNOSIS — Z7901 Long term (current) use of anticoagulants: Secondary | ICD-10-CM

## 2022-08-12 LAB — POCT INR: INR: 4.4 — AB (ref 2.0–3.0)

## 2022-08-12 NOTE — Patient Instructions (Signed)
Hold warfarin today. Then continue 1 tablet daily except take 1/2 tablet on Mondays.  Recheck INR on 12/13 at 10:00.

## 2022-08-12 NOTE — Progress Notes (Signed)
Pt reports decreased appetite with nausea after eating meals. States she has been eating less over the past few weeks.  INR today is 4.4.  Hold warfarin today. Then continue 1 tablet daily except take 1/2 tablet on Mondays.  Recheck INR in 3 weeks.

## 2022-08-12 NOTE — Progress Notes (Signed)
I have reviewed and agree with note, evaluation, plan.   Rushie Brazel, MD  

## 2022-08-18 ENCOUNTER — Other Ambulatory Visit: Payer: Self-pay | Admitting: Family Medicine

## 2022-08-23 ENCOUNTER — Other Ambulatory Visit: Payer: Self-pay | Admitting: Family Medicine

## 2022-08-31 ENCOUNTER — Other Ambulatory Visit: Payer: Self-pay | Admitting: Family Medicine

## 2022-09-02 ENCOUNTER — Ambulatory Visit (INDEPENDENT_AMBULATORY_CARE_PROVIDER_SITE_OTHER): Payer: Medicare Other

## 2022-09-02 DIAGNOSIS — Z7901 Long term (current) use of anticoagulants: Secondary | ICD-10-CM

## 2022-09-02 LAB — POCT INR: INR: 2.8 (ref 2.0–3.0)

## 2022-09-02 NOTE — Patient Instructions (Addendum)
Pre visit review using our clinic review tool, if applicable. No additional management support is needed unless otherwise documented below in the visit note.  Continue 1 tablet daily except take 1/2 tablet on Mondays.  Recheck INR in 6 weeks.

## 2022-09-02 NOTE — Progress Notes (Signed)
Continue 1 tablet daily except take 1/2 tablet on Mondays.  Recheck INR in 6 weeks.

## 2022-09-04 ENCOUNTER — Other Ambulatory Visit: Payer: Self-pay | Admitting: Family Medicine

## 2022-09-09 ENCOUNTER — Encounter: Payer: Self-pay | Admitting: Family Medicine

## 2022-09-09 ENCOUNTER — Ambulatory Visit (INDEPENDENT_AMBULATORY_CARE_PROVIDER_SITE_OTHER): Payer: Medicare Other | Admitting: Family Medicine

## 2022-09-09 VITALS — BP 118/76 | HR 70 | Temp 98.4°F | Ht 64.0 in | Wt 194.4 lb

## 2022-09-09 DIAGNOSIS — E669 Obesity, unspecified: Secondary | ICD-10-CM

## 2022-09-09 DIAGNOSIS — E1169 Type 2 diabetes mellitus with other specified complication: Secondary | ICD-10-CM | POA: Diagnosis not present

## 2022-09-09 DIAGNOSIS — E039 Hypothyroidism, unspecified: Secondary | ICD-10-CM

## 2022-09-09 LAB — CBC WITH DIFFERENTIAL/PLATELET
Basophils Absolute: 0.1 10*3/uL (ref 0.0–0.1)
Basophils Relative: 1.2 % (ref 0.0–3.0)
Eosinophils Absolute: 0.3 10*3/uL (ref 0.0–0.7)
Eosinophils Relative: 4.7 % (ref 0.0–5.0)
HCT: 42.2 % (ref 36.0–46.0)
Hemoglobin: 14.5 g/dL (ref 12.0–15.0)
Lymphocytes Relative: 29.1 % (ref 12.0–46.0)
Lymphs Abs: 2 10*3/uL (ref 0.7–4.0)
MCHC: 34.3 g/dL (ref 30.0–36.0)
MCV: 88.7 fl (ref 78.0–100.0)
Monocytes Absolute: 0.5 10*3/uL (ref 0.1–1.0)
Monocytes Relative: 7 % (ref 3.0–12.0)
Neutro Abs: 4 10*3/uL (ref 1.4–7.7)
Neutrophils Relative %: 58 % (ref 43.0–77.0)
Platelets: 215 10*3/uL (ref 150.0–400.0)
RBC: 4.76 Mil/uL (ref 3.87–5.11)
RDW: 13.6 % (ref 11.5–15.5)
WBC: 6.9 10*3/uL (ref 4.0–10.5)

## 2022-09-09 LAB — COMPREHENSIVE METABOLIC PANEL
ALT: 17 U/L (ref 0–35)
AST: 15 U/L (ref 0–37)
Albumin: 4.7 g/dL (ref 3.5–5.2)
Alkaline Phosphatase: 61 U/L (ref 39–117)
BUN: 18 mg/dL (ref 6–23)
CO2: 30 mEq/L (ref 19–32)
Calcium: 9.7 mg/dL (ref 8.4–10.5)
Chloride: 103 mEq/L (ref 96–112)
Creatinine, Ser: 1.28 mg/dL — ABNORMAL HIGH (ref 0.40–1.20)
GFR: 43.03 mL/min — ABNORMAL LOW (ref >60.00)
Glucose, Bld: 104 mg/dL — ABNORMAL HIGH (ref 70–99)
Potassium: 4.2 mEq/L (ref 3.5–5.1)
Sodium: 144 mEq/L (ref 135–145)
Total Bilirubin: 0.9 mg/dL (ref 0.2–1.2)
Total Protein: 7.3 g/dL (ref 6.0–8.3)

## 2022-09-09 LAB — T4, FREE: Free T4: 0.98 ng/dL (ref 0.60–1.60)

## 2022-09-09 LAB — HEMOGLOBIN A1C: Hgb A1c MFr Bld: 6.1 % (ref 4.6–6.5)

## 2022-09-09 LAB — TSH: TSH: 2.02 u[IU]/mL (ref 0.35–5.50)

## 2022-09-09 LAB — T3, FREE: T3, Free: 3.9 pg/mL (ref 2.3–4.2)

## 2022-09-09 NOTE — Progress Notes (Signed)
Phone [phone] In person visit   Subjective:   Erin Kirk is a 66 y.o. 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[place]) 05/12/2019    Priority: High   Low vitamin B12 level 09/07/2016    Priority: High   Disability examination 01/17/2015    Priority: High   Diabetes mellitus type 2 in obese ([place]) 11/28/2010    Priority: High   PAROXYSMAL ATRIAL FIBRILLATION 07/02/2010    Priority: High   Primary hypercoagulable state ([place]) 11/30/2007    Priority: High   Osteopenia 02/26/2020    Priority: Medium    Vitamin D deficiency 07/20/2019    Priority: Medium    Iron deficiency anemia 03/30/2019    Priority: Medium    Hyperlipidemia associated with type 2 diabetes mellitus ([place]) 03/09/2019    Priority: Medium    Cough variant asthma 10/14/2018    Priority: Medium    Neuropathy due to medical condition ([place]) 11/09/2017    Priority: Medium    Hx of adenomatous polyp of colon 04/10/2016    Priority: Medium    CKD (chronic kidney disease), stage III ([place]) 09/05/2014    Priority: Medium    Hypothyroidism 03/09/2007    Priority: Medium    Essential hypertension 03/09/2007    Priority: Medium    History of Melanoma 03/09/2007    Priority: Medium    Abnormal findings on esophagogastroduodenoscopy (EGD) 07/02/2019    Priority: Low   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    Priority: Low   Multiple gastric polyps 07/02/2019    Priority: Low   Long term (current) use of anticoagulants - warfarin 06/02/2017    Priority: Low   Encounter for therapeutic drug monitoring 10/26/2013     Priority: Low   Left facial pain 09/24/2012    Priority: Low   Oral mucosal lesion 09/24/2012    Priority: Low   TRANSIENT DISORDER INITIATING/MAINTAINING SLEEP 04/02/2010    Priority: Low   Overweight(278.02) 11/29/2009    Priority: Low   LOW BACK PAIN 06/15/2008    Priority: Low   ALLERGIC RHINITIS 03/09/2007    Priority: Low   GERD 03/09/2007    Priority: Low   Pancreatic lesion on EUS and NETSPOT scan ? neuroendocrine tumor 04/21/2020   Abnormal finding on radiology exam 04/21/2020   Intestinal metaplasia of gastric mucosa 04/21/2020   Acquired thrombophilia ([place]) 02/14/2020   Myalgia due to statin 02/14/2020   Genetic testing 08/15/2019   Family history of breast cancer    Family history of ovarian cancer    Family history of uterine cancer    Family history of lung cancer    Family history of bone cancer    Family history of testicular cancer     Medications- reviewed and updated Current Outpatient Medications  Medication Sig Dispense Refill   acetaminophen (TYLENOL) 500 MG tablet Take 500 mg by mouth every 6 (six) hours as needed (for pain.).      Alum Hydroxide-Mag Trisilicate (GAVISCON) 97-98.9 MG CHEW Chew 1-2 tablets by mouth 2 (two) times daily as needed (indigestion).  Cholecalciferol (VITAMIN D3) 50 MCG (2000 UT) TABS Take 2,000 Units by mouth daily.     dicyclomine (BENTYL) 20 MG tablet Take 1 tablet (20 mg total) by mouth every 6 (six) hours as needed for spasms (abdominal cramps, pain). 60 tablet 0   diltiazem (CARDIZEM CD) 360 MG 24 hr capsule Take 1 capsule by mouth at bedtime 90 capsule 0   docusate sodium (COLACE) 100 MG capsule Take 100 mg by mouth at bedtime. Stool softner     ferrous sulfate 325 (65 FE) MG tablet Take 1 tablet (325 mg total) by mouth daily with breakfast.     levothyroxine (SYNTHROID) 100 MCG tablet Take 1 tablet by mouth once daily 90 tablet 0   lovastatin (MEVACOR) 10 MG tablet Take 1 tablet (10 mg total) by mouth once a week.      metoprolol succinate (TOPROL-XL) 25 MG 24 hr tablet Take 1 tablet by mouth once daily 90 tablet 0   nitrofurantoin, macrocrystal-monohydrate, (MACROBID) 100 MG capsule Take 1 capsule (100 mg total) by mouth 2 (two) times daily. 20 capsule 0   omeprazole (PRILOSEC) 40 MG capsule Take 1 capsule (40 mg total) by mouth daily. 90 capsule 3   phenazopyridine (PYRIDIUM) 100 MG tablet Take 1 tablet (100 mg total) by mouth 3 (three) times daily as needed for pain. 10 tablet 0   telmisartan (MICARDIS) 40 MG tablet Take 1 tablet by mouth once daily 90 tablet 0   triamterene-hydrochlorothiazide (MAXZIDE-25) 37.5-25 MG tablet Take 1 tablet by mouth once daily 90 tablet 0   vitamin B-12 (CYANOCOBALAMIN) 1000 MCG tablet Take 1,000 mcg by mouth daily.     warfarin (COUMADIN) 5 MG tablet TAKE 1 TABLET BY MOUTH DAILY EXCEPT TAKE 1/2 TABLET ON MONDAYS OR AS DIRECTED BY ANTICOAGULATION CLINIC (Patient taking differently: Take 2.5-5 mg by mouth See admin instructions. TAKE 1 TABLET BY MOUTH DAILY EXCEPT TAKE 1/2 TABLET ON MONDAYS OR AS DIRECTED BY ANTICOAGULATION CLINIC 2.5 mg on Mon, 5 mg all other days at 4p) 105 tablet 1   Current Facility-Administered Medications  Medication Dose Route Frequency Provider Last Rate Last Admin   phenazopyridine (PYRIDIUM) tablet 100 mg  100 mg Oral TID WC Loralee Pacas, MD         Objective:  BP 118/76   Pulse 70   Temp 98.4 F (36.9 C) (Temporal)   Ht '5\' 4"'$  (1.626 m)   Wt 194 lb 6.4 oz (88.2 kg)   LMP  (LMP Unknown)   SpO2 97%   BMI 33.37 kg/m  Gen: NAD, resting comfortably CV: RRR no murmurs rubs or gallops Lungs: CTAB no crackles, wheeze, rhonchi Ext: no edema Skin: warm, dry     Assessment and Plan   #UTI issues- thankfully doing better after seeing Dr. Randol Kern twice in October- she reflects back and wonders if had a stone- had passed some blood before the first UTI- if there is microscopic blood- refer to urology for their opinion.  -ended up with diarrhea  after antibiotics but doing better.   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 Rush Landmark . follow chromogranin levels and PET scans- planned in april. EGD q6-8 months- last sept endoscopy.   # primary hypercoagulable state with history of multiple DVT and PE #acquired thrombophilia on coumadin S: patient remains on  Coumadin long term -requires lovenox bridge for procedures.  - no chest pain, shortness of breath , calf swelling A/P: no  sign of recurrent dvt/PE- doing well on coumadin- continue with our coumadin clinic.    # Atrial fibrillation S: Rate controlled with  diltiazem '360mg'$  ER Anticoagulated with  warfarin. Also requires lovenox bridge for procedures Patient is not followed by cardiology  She reports morning of EGD- she was feeling some palpitations and thought she may be in a fib which they confirmed there.  She had some upper abdominal discomfort and chest discomfort after procedure- had a large amount of flatus and felt much better- thought to be gas related most likely. Still riding exercise bike recumbent 2 hours most days- no chest pain or shortness of breath with that- helps with stress.   Occasionally thinks has episodes at home of a fib but heart rate stays in normal range as far as she can tell A/P:  a fib- appropriately anticoagulated and rate controlled- continue current medicine - wants to hold off on cardiology consult for now- as thinks likely gas related  with prior chest pain issues -with a fib episodes she wants to monitor unless increases in frequency.   #hypertension S: medication:  diltiazem '360mg'$  XR, metoprolol 25 mg XR, telmisartan '40mg'$ , triamterne hctz 37.5-25 mg Home readings #s: does not check BP Readings from Last 3 Encounters:  09/09/22 118/76  07/17/22 125/82  07/13/22 117/72  A/P: stable- continue current medicines   #hyperlipidemia- she prefers to stay on lowest dose  possible S: Medication: lovastatin 10 mg once a week Lab Results  Component Value Date   CHOL 180 03/17/2022   HDL 57.80 03/17/2022   LDLCALC 94 03/17/2022   LDLDIRECT 39.0 03/10/2019   TRIG 139.0 03/17/2022   CHOLHDL 3 03/17/2022   A/P: reasonable control- continue current medications - we could push for LDL under 70 with diabetes but she feels she's on enough medicine  # Diabetes S: Medication: diet controlled Exercise and diet- exercising pretty much daily Lab Results  Component Value Date   HGBA1C 6.2 03/17/2022   HGBA1C 6.3 09/09/2021   HGBA1C 6.1 02/14/2021   A/P: hopefully stable- update a1c today. Continue current meds for now  #hypothyroidism S: compliant On thyroid medication- levothyroxine 100 mcg Lab Results  Component Value Date   TSH 2.77 03/17/2022  A/P:hopefully stable- update tsh today. Continue current meds for now -asks for t3, t4- sister apparently doesn't conevert well   # B12 deficiency S: history of neuropathy with this. Metformin likely contributed now off. Doing well now on oral medication after time on injectoins.   A/P: b12 looked good last visit- continue current medications    #Vitamin D deficiency S: Medication: 2000 units daily Last vitamin D Lab Results  Component Value Date   VD25OH 66.68 03/17/2022  A/P:  stable- continue current medicines    Recommended follow up: Return in about 6 months (around 03/11/2023) for physical or sooner if needed.Schedule b4 you leave. Future Appointments  Date Time Provider Cut Off  10/14/2022 10:00 AM [facility]ADIN [facility]  01/12/2023 10:15 AM [facility] LAB [facility] None  01/19/2023 11:40 AM Ladell Pier, MD [facility] None  02/11/2023 10:00 AM [facility] HEALTH COACH [facility]   Lab/Order associations:   ICD-10-CM   1. Diabetes mellitus type 2 in obese (H[facility])  E11.69 Comprehensive metabolic panel   J33.5 Hemoglobin A1c    CBC with Differential/Platelet    2.  Hypothyroidism, unspecified type  E03.9 TSH    T4, free    T3, free      No orders of the defined  types were placed in this encounter.   Return precautions advised.  Garret Reddish, MD

## 2022-09-09 NOTE — Patient Instructions (Addendum)
Please stop by lab before you go If you have mychart- we will send your results within 3 business days of [place] receiving them.  If you do not have mychart- we will call you about results within 5 business days of [place] receiving them.  *please also note that you will see labs on mychart as soon as they post. I will later go in and write notes on them- will say "notes from Dr. Yong Channel"   No changes today unless labs lead [place] to make changes  Recommended follow up: Return in about 6 months (around 03/11/2023) for physical or sooner if needed.Schedule b4 you leave.

## 2022-09-16 DIAGNOSIS — L814 Other melanin hyperpigmentation: Secondary | ICD-10-CM | POA: Diagnosis not present

## 2022-09-16 DIAGNOSIS — D2261 Melanocytic nevi of right upper limb, including shoulder: Secondary | ICD-10-CM | POA: Diagnosis not present

## 2022-09-16 DIAGNOSIS — D1801 Hemangioma of skin and subcutaneous tissue: Secondary | ICD-10-CM | POA: Diagnosis not present

## 2022-09-16 DIAGNOSIS — D225 Melanocytic nevi of trunk: Secondary | ICD-10-CM | POA: Diagnosis not present

## 2022-09-16 DIAGNOSIS — D2271 Melanocytic nevi of right lower limb, including hip: Secondary | ICD-10-CM | POA: Diagnosis not present

## 2022-09-16 DIAGNOSIS — L708 Other acne: Secondary | ICD-10-CM | POA: Diagnosis not present

## 2022-09-16 DIAGNOSIS — D692 Other nonthrombocytopenic purpura: Secondary | ICD-10-CM | POA: Diagnosis not present

## 2022-09-16 DIAGNOSIS — Z85828 Personal history of other malignant neoplasm of skin: Secondary | ICD-10-CM | POA: Diagnosis not present

## 2022-09-16 DIAGNOSIS — D2272 Melanocytic nevi of left lower limb, including hip: Secondary | ICD-10-CM | POA: Diagnosis not present

## 2022-09-16 DIAGNOSIS — I8391 Asymptomatic varicose veins of right lower extremity: Secondary | ICD-10-CM | POA: Diagnosis not present

## 2022-09-16 DIAGNOSIS — D485 Neoplasm of uncertain behavior of skin: Secondary | ICD-10-CM | POA: Diagnosis not present

## 2022-09-16 DIAGNOSIS — D2262 Melanocytic nevi of left upper limb, including shoulder: Secondary | ICD-10-CM | POA: Diagnosis not present

## 2022-09-16 DIAGNOSIS — Z85820 Personal history of malignant melanoma of skin: Secondary | ICD-10-CM | POA: Diagnosis not present

## 2022-09-18 DIAGNOSIS — Z1231 Encounter for screening mammogram for malignant neoplasm of breast: Secondary | ICD-10-CM | POA: Diagnosis not present

## 2022-09-18 LAB — HM MAMMOGRAPHY

## 2022-10-14 ENCOUNTER — Ambulatory Visit (INDEPENDENT_AMBULATORY_CARE_PROVIDER_SITE_OTHER): Payer: Medicare Other

## 2022-10-14 DIAGNOSIS — Z7901 Long term (current) use of anticoagulants: Secondary | ICD-10-CM | POA: Diagnosis not present

## 2022-10-14 LAB — POCT INR: INR: 3.1 — AB (ref 2.0–3.0)

## 2022-10-14 NOTE — Progress Notes (Signed)
I have reviewed and agree with note, evaluation, plan.   Natalea Sutliff, MD  

## 2022-10-14 NOTE — Progress Notes (Signed)
Continue 1 tablet daily except take 1/2 tablet on Mondays.  Recheck INR in 6 weeks.

## 2022-10-14 NOTE — Patient Instructions (Addendum)
Pre visit review using our clinic review tool, if applicable. No additional management support is needed unless otherwise documented below in the visit note.  Continue 1 tablet daily except take 1/2 tablet on Mondays.  Recheck INR in 6 weeks.

## 2022-10-18 ENCOUNTER — Other Ambulatory Visit: Payer: Self-pay | Admitting: Family Medicine

## 2022-11-01 ENCOUNTER — Other Ambulatory Visit: Payer: Self-pay | Admitting: Family Medicine

## 2022-11-15 ENCOUNTER — Other Ambulatory Visit: Payer: Self-pay | Admitting: Family Medicine

## 2022-11-22 ENCOUNTER — Other Ambulatory Visit: Payer: Self-pay | Admitting: Family Medicine

## 2022-11-25 ENCOUNTER — Ambulatory Visit (INDEPENDENT_AMBULATORY_CARE_PROVIDER_SITE_OTHER): Payer: Medicare Other

## 2022-11-25 DIAGNOSIS — Z7901 Long term (current) use of anticoagulants: Secondary | ICD-10-CM

## 2022-11-25 LAB — POCT INR: INR: 3.9 — AB (ref 2.0–3.0)

## 2022-11-25 NOTE — Patient Instructions (Addendum)
Pre visit review using our clinic review tool, if applicable. No additional management support is needed unless otherwise documented below in the visit note.  Hold dose today and then continue 1 tablet daily except take 1/2 tablet on Mondays.  Recheck INR in 4 weeks.

## 2022-11-25 NOTE — Progress Notes (Signed)
I have reviewed and agree with note, evaluation, plan.   Jahna Liebert, MD  

## 2022-11-25 NOTE — Progress Notes (Addendum)
Pt reports she haas not been eating many vitamin K rich foods. She will begin incorporating those into her diet consistently again.  Hold dose today and then continue 1 tablet daily except take 1/2 tablet on Mondays.  Recheck INR in 4 weeks per pt request.

## 2022-11-29 ENCOUNTER — Other Ambulatory Visit: Payer: Self-pay | Admitting: Family Medicine

## 2022-12-03 ENCOUNTER — Other Ambulatory Visit: Payer: Self-pay | Admitting: Oncology

## 2022-12-03 ENCOUNTER — Ambulatory Visit (HOSPITAL_COMMUNITY)
Admission: RE | Admit: 2022-12-03 | Discharge: 2022-12-03 | Disposition: A | Discharge status: Home / Self Care | Payer: Medicare Other | Source: Ambulatory Visit | Attending: Oncology | Admitting: Oncology

## 2022-12-03 DIAGNOSIS — C7A092 Malignant carcinoid tumor of the stomach: Secondary | ICD-10-CM

## 2022-12-03 MED ORDER — COPPER CU 64 DOTATATE 1 MCI/ML IV SOLN
4.0000 | Freq: Once | INTRAVENOUS | Status: AC
  Administered 2022-12-03: 4.14 via INTRAVENOUS

## 2022-12-06 ENCOUNTER — Other Ambulatory Visit: Payer: Self-pay | Admitting: Family Medicine

## 2022-12-10 ENCOUNTER — Telehealth: Payer: Self-pay

## 2022-12-10 NOTE — Telephone Encounter (Signed)
-----   Message from Ladell Pier, MD sent at 12/10/2022  7:29 AM EDT ----- Please call patient, PET is stable, focal uptake in stomach without a visible mass, no evidence of metastatic disease, f/u as scheduled

## 2022-12-10 NOTE — Telephone Encounter (Signed)
Patient gave verbal understanding and had no further questions or concerns  

## 2022-12-23 ENCOUNTER — Ambulatory Visit (INDEPENDENT_AMBULATORY_CARE_PROVIDER_SITE_OTHER): Payer: Medicare Other

## 2022-12-23 DIAGNOSIS — Z7901 Long term (current) use of anticoagulants: Secondary | ICD-10-CM

## 2022-12-23 LAB — POCT INR: INR: 4 — AB (ref 2.0–3.0)

## 2022-12-23 NOTE — Patient Instructions (Addendum)
Pre visit review using our clinic review tool, if applicable. No additional management support is needed unless otherwise documented below in the visit note.  Hold dose today and then change weekly dose to take 1 tablet daily except take 1/2 tablet on Mondays and Thursdays. Recheck INR in 3 weeks.

## 2022-12-23 NOTE — Progress Notes (Addendum)
Pt reports she is not eating as many vitamin K foods and diet in general has declined. Hold dose today and then change weekly dose to take 1 tablet daily except take 1/2 tablet on Mondays and Thursdays. Recheck INR in 3 weeks.

## 2022-12-23 NOTE — Progress Notes (Signed)
I have reviewed and agree with note, evaluation, plan.   Kalila Adkison, MD  

## 2023-01-11 ENCOUNTER — Other Ambulatory Visit: Payer: Self-pay | Admitting: *Deleted

## 2023-01-12 ENCOUNTER — Inpatient Hospital Stay: Payer: Medicare Other | Attending: Oncology

## 2023-01-12 ENCOUNTER — Other Ambulatory Visit: Payer: Self-pay

## 2023-01-12 DIAGNOSIS — K869 Disease of pancreas, unspecified: Secondary | ICD-10-CM | POA: Diagnosis not present

## 2023-01-12 DIAGNOSIS — Z803 Family history of malignant neoplasm of breast: Secondary | ICD-10-CM | POA: Diagnosis not present

## 2023-01-12 DIAGNOSIS — Z8049 Family history of malignant neoplasm of other genital organs: Secondary | ICD-10-CM | POA: Insufficient documentation

## 2023-01-12 DIAGNOSIS — Z85820 Personal history of malignant melanoma of skin: Secondary | ICD-10-CM | POA: Insufficient documentation

## 2023-01-12 DIAGNOSIS — Z862 Personal history of diseases of the blood and blood-forming organs and certain disorders involving the immune mechanism: Secondary | ICD-10-CM | POA: Insufficient documentation

## 2023-01-12 DIAGNOSIS — R109 Unspecified abdominal pain: Secondary | ICD-10-CM | POA: Insufficient documentation

## 2023-01-12 DIAGNOSIS — C7A092 Malignant carcinoid tumor of the stomach: Secondary | ICD-10-CM

## 2023-01-12 DIAGNOSIS — E119 Type 2 diabetes mellitus without complications: Secondary | ICD-10-CM | POA: Insufficient documentation

## 2023-01-12 DIAGNOSIS — E039 Hypothyroidism, unspecified: Secondary | ICD-10-CM | POA: Insufficient documentation

## 2023-01-12 DIAGNOSIS — D6861 Antiphospholipid syndrome: Secondary | ICD-10-CM | POA: Diagnosis not present

## 2023-01-12 DIAGNOSIS — Z85020 Personal history of malignant carcinoid tumor of stomach: Secondary | ICD-10-CM | POA: Diagnosis not present

## 2023-01-13 ENCOUNTER — Ambulatory Visit (INDEPENDENT_AMBULATORY_CARE_PROVIDER_SITE_OTHER): Payer: Medicare Other

## 2023-01-13 DIAGNOSIS — Z7901 Long term (current) use of anticoagulants: Secondary | ICD-10-CM

## 2023-01-13 LAB — CHROMOGRANIN A: Chromogranin A (ng/mL): 1115 ng/mL — ABNORMAL HIGH (ref 0.0–101.8)

## 2023-01-13 LAB — POCT INR: INR: 2.9 (ref 2.0–3.0)

## 2023-01-13 NOTE — Patient Instructions (Addendum)
Pre visit review using our clinic review tool, if applicable. No additional management support is needed unless otherwise documented below in the visit note.  Continue 1 tablet daily except take 1/2 tablet on Mondays and Thursdays. Recheck INR in 6 weeks.

## 2023-01-13 NOTE — Progress Notes (Signed)
Continue 1 tablet daily except take 1/2 tablet on Mondays and Thursdays. Recheck INR in 6 weeks.

## 2023-01-13 NOTE — Progress Notes (Signed)
I have reviewed and agree with note, evaluation, plan.   Terrell Shimko, MD  

## 2023-01-18 ENCOUNTER — Other Ambulatory Visit: Payer: Self-pay | Admitting: Family Medicine

## 2023-01-19 ENCOUNTER — Inpatient Hospital Stay: Payer: Medicare Other | Admitting: Oncology

## 2023-01-19 VITALS — BP 116/76 | HR 74 | Temp 98.1°F | Resp 18 | Ht 64.0 in | Wt 198.0 lb

## 2023-01-19 DIAGNOSIS — C7A092 Malignant carcinoid tumor of the stomach: Secondary | ICD-10-CM | POA: Diagnosis not present

## 2023-01-19 DIAGNOSIS — Z85020 Personal history of malignant carcinoid tumor of stomach: Secondary | ICD-10-CM | POA: Diagnosis not present

## 2023-01-19 DIAGNOSIS — Z803 Family history of malignant neoplasm of breast: Secondary | ICD-10-CM | POA: Diagnosis not present

## 2023-01-19 DIAGNOSIS — K869 Disease of pancreas, unspecified: Secondary | ICD-10-CM | POA: Diagnosis not present

## 2023-01-19 DIAGNOSIS — Z862 Personal history of diseases of the blood and blood-forming organs and certain disorders involving the immune mechanism: Secondary | ICD-10-CM | POA: Diagnosis not present

## 2023-01-19 DIAGNOSIS — E039 Hypothyroidism, unspecified: Secondary | ICD-10-CM | POA: Diagnosis not present

## 2023-01-19 DIAGNOSIS — E119 Type 2 diabetes mellitus without complications: Secondary | ICD-10-CM | POA: Diagnosis not present

## 2023-01-19 DIAGNOSIS — R109 Unspecified abdominal pain: Secondary | ICD-10-CM | POA: Diagnosis not present

## 2023-01-19 DIAGNOSIS — D6861 Antiphospholipid syndrome: Secondary | ICD-10-CM | POA: Diagnosis not present

## 2023-01-19 DIAGNOSIS — Z85820 Personal history of malignant melanoma of skin: Secondary | ICD-10-CM | POA: Diagnosis not present

## 2023-01-19 NOTE — Progress Notes (Signed)
[facility] OFFICE PROGRESS NOTE   Diagnosis: Gastric carcinoid tumor  INTERVAL HISTORY:   Ms. Blecha returns as scheduled.  She generally feels well.  She has occasional flushing at night.  She continues to have intermittent diarrhea and occasional upper abdominal discomfort.  She held Prilosec for 1 week prior to the chromogranin a level 01/12/2023.  Objective:  Vital signs in last 24 hours:  Blood pressure 116/76, pulse 74, temperature 98.1 F (36.7 C), temperature source Oral, resp. rate 18, height 5\' 4"  (1.626 m), weight 198 lb (89.8 kg), SpO2 98 %.     Lymphatics: No cervical, supraclavicular, axillary, or inguinal nodes Resp: Lungs clear bilaterally Cardio: Regular rate and rhythm GI: Mild tenderness in the right upper abdomen, no hepatosplenomegaly, no mass Vascular: No leg edema  Lab Results:  Lab Results  Component Value Date   WBC 6.9 09/09/2022   HGB 14.5 09/09/2022   HCT 42.2 09/09/2022   MCV 88.7 09/09/2022   PLT 215.0 09/09/2022   NEUTROABS 4.0 09/09/2022    CMP  Lab Results  Component Value Date   NA 144 09/09/2022   K 4.2 09/09/2022   CL 103 09/09/2022   CO2 30 09/09/2022   GLUCOSE 104 (H) 09/09/2022   BUN 18 09/09/2022   CREATININE 1.28 (H) 09/09/2022   CALCIUM 9.7 09/09/2022   PROT 7.3 09/09/2022   ALBUMIN 4.7 09/09/2022   AST 15 09/09/2022   ALT 17 09/09/2022   ALKPHOS 61 09/09/2022   BILITOT 0.9 09/09/2022   GFRNONAA 49 (L) 08/19/2020   GFRAA 57 (L) 08/19/2020    No results found for: "CEA1", "CEA", "ZOX096", "CA125"  Lab Results  Component Value Date   INR 2.9 01/13/2023   LABPROT 27.5 (H) 08/02/2013    Imaging:  No results found.  Medications: I have reviewed the patient's current medications.   Assessment/Plan: Multiple gastric carcinoid tumors Upper endoscopy 05/05/2019 with resection of multiple gastric carcinoid tumors EUS 07/12/2019-resection of multiple gastric carcinoid tumors, positive resection  margin at the gastric cardia polypectomy site, tubular adenomas and hyperplastic polyps removed from the lesser curvature Elevated chromogranin A level CT abdomen/pelvis 05/18/2019-no evidence of a gastric mass or metastatic disease Netspot 08/11/2019-focal activity corresponding to a 2-3 mm lesion in the mid pancreas, diffuse activity in the stomach EGD 10/09/2019- 3 areas of scar/clip/polypoid tissue removed with negative pathology EUS 10/09/2019-4 mm pancreas body lesion, FNA biopsy negative, no adenopathy EUS 06/19/2020-no evidence of recurrent tumor in areas of gastric body scars, nodularity was noted underneath 2 endoclips in the greater curvature, this area was biopsied and polyps were removed from the middle gastric body and proximal gastric antrum, biopsies from erosions and erythema were taken at the greater curvature, lesser curvature, and antrum.  An irregular lesion was identified in the pancreas body measuring 3.4 x 4.6 mm, the pancreas lesion was biopsied.  The pathology from the pancreas FNA was negative, the pathology from multiple biopsies including the gastric body scar, antrum polyp, greater curvature biopsy, lesser curvature biopsy, and gastric cardia biopsy revealed low-grade neuroendocrine tumor Netspot 04/14/2021-no residual avid tumor in the stomach, no evidence of tracer avid metastatic disease, decrease in tracer activity associated with the pancreas lesion Upper endoscopy 06/12/2021-5 gastric polyps resected and retrieved-hyperplastic polyp with well differentiated neuroendocrine tumor, stomach cardia and fundus biopsy-reactive gastropathy with neuroendocrine dysplasia, lesser curvature biopsy-reactive gastropathy with neuroendocrine hyperplasia Upper endoscopy 05/28/2022-multiple polyps removed from the stomach and areas of chronic gastritis were biopsied-hyperplastic polyps, reactive gastropathy, mild chronic  gastritis, negative for H. 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   Hypothyroidism 7.  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  Family history of uterine and breast cancer 9.  Gastric and colonic adenomas Colonoscopy 06/12/2021-polyps removed from the cecum, ascending colon, and descending colon-tubular adenomas and hyperplastic polyp 10.  Hypodense pancreas lesion 05/18/2019 -positive Netspot uptake 08/11/2019, negative FNA biopsy       Disposition: Erin Kirk appears stable.  The chromogranin A level is unchanged.  I reviewed the 12/03/2022 PET findings and images with her.  There is stable hypermetabolism at the gastric antrum.  A pancreas tail lesion was noted on the 04/14/2021 study.  I believe this lesion is still present.  The plan is to continue observation.  She will follow-up with gastroenterology this summer.  Ms. Grimsley will call for new symptoms.  She will return for an office visit and chromogranin a level in 6 months.  Thornton Papas, MD  01/19/2023  12:28 PM

## 2023-01-22 ENCOUNTER — Telehealth: Payer: Self-pay | Admitting: *Deleted

## 2023-01-22 NOTE — Telephone Encounter (Signed)
Informed Ms. Yuan that re-read of her PET was as Dr. Truett Perna discussed. Small tracer activity mid body of pancrease that is same as noted on CT in 04/2019. No change.

## 2023-02-16 ENCOUNTER — Other Ambulatory Visit: Payer: Self-pay | Admitting: Family Medicine

## 2023-02-23 ENCOUNTER — Ambulatory Visit (INDEPENDENT_AMBULATORY_CARE_PROVIDER_SITE_OTHER): Payer: Medicare Other

## 2023-02-23 VITALS — Wt 198.0 lb

## 2023-02-23 DIAGNOSIS — Z0000 Encounter for general adult medical examination without abnormal findings: Secondary | ICD-10-CM | POA: Diagnosis not present

## 2023-02-23 NOTE — Patient Instructions (Signed)
Erin Kirk , Thank you for taking time to come for your Medicare Wellness Visit. I appreciate your ongoing commitment to your health goals. Please review the following plan we discussed and let me know if I can assist you in the future.   These are the goals we discussed:  Goals       Patient Stated      Stay healthy and active as I can       stay as active as I can (pt-stated)      Weight (lb) < 160 lb (72.6 kg)      Will drink water more; tea less Stop snacking;  Will plan to get back on bike           This is a list of the screening recommended for you and due dates:  Health Maintenance  Topic Date Due   COVID-19 Vaccine (7 - 2023-24 season) 08/17/2022   Yearly kidney health urinalysis for diabetes  03/18/2023   Hemoglobin A1C  03/11/2023   Eye exam for diabetics  03/14/2023   Complete foot exam   03/18/2023   Flu Shot  04/22/2023   Yearly kidney function blood test for diabetes  09/10/2023   Medicare Annual Wellness Visit  02/23/2024   Colon Cancer Screening  06/12/2024   Mammogram  09/18/2024   DTaP/Tdap/Td vaccine (3 - Td or Tdap) 03/18/2028   Pneumonia Vaccine  Completed   DEXA scan (bone density measurement)  Completed   Hepatitis C Screening  Completed   Zoster (Shingles) Vaccine  Completed   HPV Vaccine  Aged Out    Advanced directives: Please bring a copy of your health care power of attorney and living will to the office at your convenience.  Conditions/risks identified: stay active   Next appointment: Follow up in one year for your annual wellness visit    Preventive Care 65 Years and Older, Female Preventive care refers to lifestyle choices and visits with your health care provider that can promote health and wellness. What does preventive care include? A yearly physical exam. This is also called an annual well check. Dental exams once or twice a year. Routine eye exams. Ask your health care provider how often you should have your eyes  checked. Personal lifestyle choices, including: Daily care of your teeth and gums. Regular physical activity. Eating a healthy diet. Avoiding tobacco and drug use. Limiting alcohol use. Practicing safe sex. Taking low-dose aspirin every day. Taking vitamin and mineral supplements as recommended by your health care provider. What happens during an annual well check? The services and screenings done by your health care provider during your annual well check will depend on your age, overall health, lifestyle risk factors, and family history of disease. Counseling  Your health care provider may ask you questions about your: Alcohol use. Tobacco use. Drug use. Emotional well-being. Home and relationship well-being. Sexual activity. Eating habits. History of falls. Memory and ability to understand (cognition). Work and work Astronomer. Reproductive health. Screening  You may have the following tests or measurements: Height, weight, and BMI. Blood pressure. Lipid and cholesterol levels. These may be checked every 5 years, or more frequently if you are over 64 years old. Skin check. Lung cancer screening. You may have this screening every year starting at age 58 if you have a 30-pack-year history of smoking and currently smoke or have quit within the past 15 years. Fecal occult blood test (FOBT) of the stool. You may have this test every year  starting at age 53. Flexible sigmoidoscopy or colonoscopy. You may have a sigmoidoscopy every 5 years or a colonoscopy every 10 years starting at age 79. Hepatitis C blood test. Hepatitis B blood test. Sexually transmitted disease (STD) testing. Diabetes screening. This is done by checking your blood sugar (glucose) after you have not eaten for a while (fasting). You may have this done every 1-3 years. Bone density scan. This is done to screen for osteoporosis. You may have this done starting at age 26. Mammogram. This may be done every 1-2  years. Talk to your health care provider about how often you should have regular mammograms. Talk with your health care provider about your test results, treatment options, and if necessary, the need for more tests. Vaccines  Your health care provider may recommend certain vaccines, such as: Influenza vaccine. This is recommended every year. Tetanus, diphtheria, and acellular pertussis (Tdap, Td) vaccine. You may need a Td booster every 10 years. Zoster vaccine. You may need this after age 17. Pneumococcal 13-valent conjugate (PCV13) vaccine. One dose is recommended after age 78. Pneumococcal polysaccharide (PPSV23) vaccine. One dose is recommended after age 52. Talk to your health care provider about which screenings and vaccines you need and how often you need them. This information is not intended to replace advice given to you by your health care provider. Make sure you discuss any questions you have with your health care provider. Document Released: 10/04/2015 Document Revised: 05/27/2016 Document Reviewed: 07/09/2015 Elsevier Interactive Patient Education  2017 [employer].  Fall Prevention in the Home Falls can cause injuries. They can happen to people of all ages. There are many things you can do to make your home safe and to help prevent falls. What can I do on the outside of my home? Regularly fix the edges of walkways and driveways and fix any cracks. Remove anything that might make you trip as you walk through a door, such as a raised step or threshold. Trim any bushes or trees on the path to your home. Use bright outdoor lighting. Clear any walking paths of anything that might make someone trip, such as rocks or tools. Regularly check to see if handrails are loose or broken. Make sure that both sides of any steps have handrails. Any raised decks and porches should have guardrails on the edges. Have any leaves, snow, or ice cleared regularly. Use sand or salt on walking paths  during winter. Clean up any spills in your garage right away. This includes oil or grease spills. What can I do in the bathroom? Use night lights. Install grab bars by the toilet and in the tub and shower. Do not use towel bars as grab bars. Use non-skid mats or decals in the tub or shower. If you need to sit down in the shower, use a plastic, non-slip stool. Keep the floor dry. Clean up any water that spills on the floor as soon as it happens. Remove soap buildup in the tub or shower regularly. Attach bath mats securely with double-sided non-slip rug tape. Do not have throw rugs and other things on the floor that can make you trip. What can I do in the bedroom? Use night lights. Make sure that you have a light by your bed that is easy to reach. Do not use any sheets or blankets that are too big for your bed. They should not hang down onto the floor. Have a firm chair that has side arms. You can use this for  support while you get dressed. Do not have throw rugs and other things on the floor that can make you trip. What can I do in the kitchen? Clean up any spills right away. Avoid walking on wet floors. Keep items that you use a lot in easy-to-reach places. If you need to reach something above you, use a strong step stool that has a grab bar. Keep electrical cords out of the way. Do not use floor polish or wax that makes floors slippery. If you must use wax, use non-skid floor wax. Do not have throw rugs and other things on the floor that can make you trip. What can I do with my stairs? Do not leave any items on the stairs. Make sure that there are handrails on both sides of the stairs and use them. Fix handrails that are broken or loose. Make sure that handrails are as long as the stairways. Check any carpeting to make sure that it is firmly attached to the stairs. Fix any carpet that is loose or worn. Avoid having throw rugs at the top or bottom of the stairs. If you do have throw  rugs, attach them to the floor with carpet tape. Make sure that you have a light switch at the top of the stairs and the bottom of the stairs. If you do not have them, ask someone to add them for you. What else can I do to help prevent falls? Wear shoes that: Do not have high heels. Have rubber bottoms. Are comfortable and fit you well. Are closed at the toe. Do not wear sandals. If you use a stepladder: Make sure that it is fully opened. Do not climb a closed stepladder. Make sure that both sides of the stepladder are locked into place. Ask someone to hold it for you, if possible. Clearly mark and make sure that you can see: Any grab bars or handrails. First and last steps. Where the edge of each step is. Use tools that help you move around (mobility aids) if they are needed. These include: Canes. Walkers. Scooters. Crutches. Turn on the lights when you go into a dark area. Replace any light bulbs as soon as they burn out. Set up your furniture so you have a clear path. Avoid moving your furniture around. If any of your floors are uneven, fix them. If there are any pets around you, be aware of where they are. Review your medicines with your doctor. Some medicines can make you feel dizzy. This can increase your chance of falling. Ask your doctor what other things that you can do to help prevent falls. This information is not intended to replace advice given to you by your health care provider. Make sure you discuss any questions you have with your health care provider. Document Released: 07/04/2009 Document Revised: 02/13/2016 Document Reviewed: 10/12/2014 Elsevier Interactive Patient Education  2017 [employer].

## 2023-02-23 NOTE — Progress Notes (Signed)
I connected with  Erin Kirk on 02/23/23 by a audio enabled telemedicine application and verified that I am speaking with the correct person using two identifiers.  Patient Location: Home  Provider Location: Office/Clinic  I discussed the limitations of evaluation and management by telemedicine. The patient expressed understanding and agreed to proceed.   Subjective:   Erin Kirk is a 67 y.o. female who presents for Medicare Annual (Subsequent) preventive examination.  Review of Systems     Cardiac Risk Factors include: advanced age (>78men, >81 women)     Objective:    Today's Vitals   02/23/23 1029  Weight: 198 lb (89.8 kg)   Body mass index is 33.99 kg/m.     02/23/2023   10:38 AM 07/17/2022   12:07 PM 05/28/2022    9:47 AM 02/03/2022   10:10 AM 06/12/2021    6:46 AM 11/15/2020   10:23 AM 06/19/2020    7:05 AM  Advanced Directives  Does Patient Have a Medical Advance Directive? Yes Yes Yes Yes Yes Yes No  Type of Estate agent of [place];Living will Healthcare Power of [place];Living will  Living will Living will;Healthcare Power of [employer] Power of [place];Living will   Does patient want to make changes to medical advance directive?  No - Patient declined    No - Patient declined   Copy of Healthcare Power of Attorney in Chart? No - copy requested No - copy requested   No - copy requested No - copy requested     Current Medications (verified) Outpatient Encounter Medications as of 02/23/2023  Medication Sig   ABRYSVO 120 MCG/0.5ML injection    acetaminophen (TYLENOL) 500 MG tablet Take 500 mg by mouth every 6 (six) hours as needed (for pain.).    Alum Hydroxide-Mag Trisilicate (GAVISCON) 80-14.2 MG CHEW Chew 1-2 tablets by mouth 2 (two) times daily as needed (indigestion).   Cholecalciferol (VITAMIN D3) 50 MCG (2000 UT) TABS Take 2,000 Units by mouth daily.   diltiazem (CARDIZEM CD) 360 MG 24 hr capsule Take 1 capsule by mouth at  bedtime   docusate sodium (COLACE) 100 MG capsule Take 100 mg by mouth at bedtime. Stool softner   ferrous sulfate 325 (65 FE) MG tablet Take 1 tablet (325 mg total) by mouth daily with breakfast.   levothyroxine (SYNTHROID) 100 MCG tablet Take 1 tablet by mouth once daily   lovastatin (MEVACOR) 10 MG tablet Take 1 tablet (10 mg total) by mouth once a week.   metoprolol succinate (TOPROL-XL) 25 MG 24 hr tablet Take 1 tablet by mouth once daily   omeprazole (PRILOSEC) 40 MG capsule Take 1 capsule (40 mg total) by mouth daily.   telmisartan (MICARDIS) 40 MG tablet Take 1 tablet by mouth once daily   triamterene-hydrochlorothiazide (MAXZIDE-25) 37.5-25 MG tablet Take 1 tablet by mouth once daily   vitamin B-12 (CYANOCOBALAMIN) 1000 MCG tablet Take 1,000 mcg by mouth daily.   warfarin (COUMADIN) 5 MG tablet AS DIRECTED BY  ANTICOAGULATION  CLINIC   Facility-Administered Encounter Medications as of 02/23/2023  Medication   phenazopyridine (PYRIDIUM) tablet 100 mg    Allergies (verified) Iohexol, Amoxicillin-pot clavulanate, and Other   History: Past Medical History:  Diagnosis Date   Allergy    Anxiety    Arthritis    Asthma    many years ago   Blood transfusion without reported diagnosis    Cataract    Chronic kidney disease    Clotting disorder (HCC)  Depression    Husband died 05/2019   Diabetes (HCC)    not on medication   DVT (deep venous thrombosis) (HCC)    left arm   Dysrhythmia    Family history of bone cancer    Family history of breast cancer    Family history of lung cancer    Family history of ovarian cancer    Family history of testicular cancer    Family history of uterine cancer    GERD (gastroesophageal reflux disease)    Headache    Hypertension    Hypothyroidism    Iron deficiency anemia 03/30/2019   Irregular heart beat    tachycardia   Lupus anticoagulant with hypercoagulable state (HCC)    Melanoma (HCC)    2005- face    Paroxysmal atrial  fibrillation (HCC)    Plantar fasciitis    Pneumonia 2019   Primary hypercoagulable state (HCC)    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   Pulmonary embolism (HCC)    Thyroid disease    hypothyroidism   Past Surgical History:  Procedure Laterality Date   ABDOMINAL HYSTERECTOMY     noncancerous   BACK SURGERY  1983   BIOPSY  06/19/2020   Procedure: BIOPSY;  Surgeon: Lemar Lofty., MD;  Location: [facility]COPY;  Service: Gastroenterology;;   BIOPSY  06/12/2021   Procedure: BIOPSY;  Surgeon: Lemar Lofty., MD;  Location[facility];  Service: Gastroenterology;;   BIOPSY  05/28/2022   Procedure: BIOPSY;  Surgeon: Iva Boop, MD;  Location: [facility] ENDOSCOPY;  Service: Gastroenterology;;   CHOLECYSTECTOMY     COLONOSCOPY     COLONOSCOPY WITH PROPOFOL N/A 06/12/2021   Procedure: COLONOSCOPY WITH PROPOFOL;  Surgeon: Lemar Lofty., MD;  Location: [facility] ENDOSCOPY;  Service: Gastroenterology;  Laterality: N/A;   ENDOSCOPIC MUCOSAL RESECTION N/A 07/12/2019   Procedure: ENDOSCOPIC MUCOSAL RESECTION;  Surgeon: Meridee Score Netty Starring., MD;  Location: [facility] ENDOSCOPY;  Service: Gastroenterology;  Laterality: N/A;   ENDOSCOPIC MUCOSAL RESECTION  06/19/2020   Procedure: ENDOSCOPIC MUCOSAL RESECTION;  Surgeon: Meridee Score Netty Starring., MD;  Location: [facility] ENDOSCOPY;  Service: Gastroenterology;;   ESOPHAGOGASTRODUODENOSCOPY N/A 06/19/2020   Procedure: ESOPHAGOGASTRODUODENOSCOPY (EGD);  Surgeon: Lemar Lofty., MD;  Location: [facility] ENDOSCOPY;  Service: Gastroenterology;  Laterality: N/A;   ESOPHAGOGASTRODUODENOSCOPY (EGD) WITH PROPOFOL N/A 07/12/2019   Procedure: ESOPHAGOGASTRODUODENOSCOPY (EGD) WITH PROPOFOL;  Surgeon: Meridee Score Netty Starring., MD;  Location: [facility] ENDOSCOPY;  Service: Gastroenterology;  Laterality: N/A;   ESOPHAGOGASTRODUODENOSCOPY (EGD) WITH PROPOFOL N/A 10/09/2019   Procedure: ESOPHAGOGASTRODUODENOSCOPY (EGD) WITH  PROPOFOL;  Surgeon: Meridee Score Netty Starring., MD;  Location: [facility] ENDOSCOPY;  Service: Gastroenterology;  Laterality: N/A;   ESOPHAGOGASTRODUODENOSCOPY (EGD) WITH PROPOFOL N/A 06/12/2021   Procedure: ESOPHAGOGASTRODUODENOSCOPY (EGD) WITH PROPOFOL;  Surgeon: Meridee Score Netty Starring., MD;  Location: [facility] ENDOSCOPY;  Service: Gastroenterology;  Laterality: N/A;   ESOPHAGOGASTRODUODENOSCOPY (EGD) WITH PROPOFOL N/A 05/28/2022   Procedure: ESOPHAGOGASTRODUODENOSCOPY (EGD) WITH PROPOFOL;  Surgeon: Iva Boop, MD;  Location: [facility] ENDOSCOPY;  Service: Gastroenterology;  Laterality: N/A;   EUS N/A 07/12/2019   Procedure: UPPER ENDOSCOPIC ULTRASOUND (EUS) RADIAL;  Surgeon: Lemar Lofty., MD;  Location: [facility] ENDOSCOPY;  Service: Gastroenterology;  Laterality: N/A;   EUS N/A 10/09/2019   Procedure: UPPER ENDOSCOPIC ULTRASOUND (EUS) RADIAL;  Surgeon: Lemar Lofty., MD;  Location: [facility] ENDOSCOPY;  Service: Gastroenterology;  Laterality: N/A;   fatty tumor breast Right    removed   FINE NEEDLE ASPIRATION  06/19/2020   Procedure: FINE NEEDLE ASPIRATION (FNA)  LINEAR;  Surgeon: Mansouraty, Netty Starring., MD;  Location: [facility] ENDOSCOPY;  Service: Gastroenterology;;   FOREIGN BODY REMOVAL  10/09/2019   Procedure: FOREIGN BODY REMOVAL;  Surgeon: Meridee Score Netty Starring., MD;  Location: [facility] ENDOSCOPY;  Service: Gastroenterology;;   FOREIGN BODY REMOVAL  06/19/2020   Procedure: FOREIGN BODY REMOVAL;  Surgeon: Lemar Lofty., MD;  Location: [facility] ENDOSCOPY;  Service: Gastroenterology;;  polyp removal with net    HEMOSTASIS CLIP PLACEMENT  07/12/2019   Procedure: HEMOSTASIS CLIP PLACEMENT;  Surgeon: Lemar Lofty., MD;  Location: [facility] ENDOSCOPY;  Service: Gastroenterology;;   HEMOSTASIS CLIP PLACEMENT  10/09/2019   Procedure: HEMOSTASIS CLIP PLACEMENT;  Surgeon: Lemar Lofty., MD;  Location: [facility] ENDOSCOPY;  Service: Gastroenterology;;   HEMOSTASIS CLIP PLACEMENT  06/19/2020   Procedure: HEMOSTASIS  CLIP PLACEMENT;  Surgeon: Lemar Lofty., MD;  Location: [facility] ENDOSCOPY;  Service: Gastroenterology;;   HEMOSTASIS CLIP PLACEMENT  06/12/2021   Procedure: HEMOSTASIS CLIP PLACEMENT;  Surgeon: Lemar Lofty., MD;  Location: [facility] ENDOSCOPY;  Service: Gastroenterology;;   HEMOSTASIS CLIP PLACEMENT  05/28/2022   Procedure: HEMOSTASIS CLIP PLACEMENT;  Surgeon: Iva Boop, MD;  Location: [facility] ENDOSCOPY;  Service: Gastroenterology;;   HEMOSTASIS CONTROL  07/12/2019   Procedure: HEMOSTASIS CONTROL;  Surgeon: Lemar Lofty., MD;  Location: [facility] ENDOSCOPY;  Service: Gastroenterology;;   HYSTEROTOMY     KNEE ARTHROSCOPY Left    Lymp nodes Right 08/2004   MELANOMA EXCISION Right 2005   face    OOPHORECTOMY Bilateral    POLYPECTOMY  07/12/2019   Procedure: POLYPECTOMY;  Surgeon: Lemar Lofty., MD;  Location: [facility] ENDOSCOPY;  Service: Gastroenterology;;   POLYPECTOMY  10/09/2019   Procedure: POLYPECTOMY;  Surgeon: Lemar Lofty., MD;  Location: [facility] ENDOSCOPY;  Service: Gastroenterology;;   POLYPECTOMY  06/19/2020   Procedure: POLYPECTOMY;  Surgeon: Lemar Lofty., MD;  Location: [facility] ENDOSCOPY;  Service: Gastroenterology;;   POLYPECTOMY  06/12/2021   Procedure: POLYPECTOMY;  Surgeon: Lemar Lofty., MD;  Location: [facility] ENDOSCOPY;  Service: Gastroenterology;;   POLYPECTOMY  05/28/2022   Procedure: POLYPECTOMY;  Surgeon: Iva Boop, MD;  Location: [facility] ENDOSCOPY;  Service: Gastroenterology;;   removal lymphnodes in right neck     SUBMUCOSAL LIFTING INJECTION  07/12/2019   Procedure: SUBMUCOSAL LIFTING INJECTION;  Surgeon: Lemar Lofty., MD;  Location: [facility] ENDOSCOPY;  Service: Gastroenterology;;   SUBMUCOSAL LIFTING INJECTION  10/09/2019   Procedure: SUBMUCOSAL LIFTING INJECTION;  Surgeon: Lemar Lofty., MD;  Location: [facility] ENDOSCOPY;  Service: Gastroenterology;;   SUBMUCOSAL LIFTING INJECTION  06/12/2021   Procedure: SUBMUCOSAL LIFTING  INJECTION;  Surgeon: Lemar Lofty., MD;  Location: [facility] ENDOSCOPY;  Service: Gastroenterology;;   SUBMUCOSAL LIFTING INJECTION  05/28/2022   Procedure: SUBMUCOSAL LIFTING INJECTION;  Surgeon: Iva Boop, MD;  Location: [facility] ENDOSCOPY;  Service: Gastroenterology;;   UPPER ESOPHAGEAL ENDOSCOPIC ULTRASOUND (EUS) N/A 06/19/2020   Procedure: UPPER ESOPHAGEAL ENDOSCOPIC ULTRASOUND (EUS);  Surgeon: Lemar Lofty., MD;  Location: [facility] ENDOSCOPY;  Service: Gastroenterology;  Laterality: N/A;   UPPER GASTROINTESTINAL ENDOSCOPY     Family History  Problem Relation Age of Onset   Coronary artery disease Mother        Mom 58, Dad 57-smoker   Diabetes Mother    Ovarian cancer Mother 86       not officially diagnosed   Hypertension Father    Diabetes Father    Mitral valve prolapse Sister    Diabetes Sister    Breast cancer Maternal Aunt 46  Breast cancer Half-Sister 71   Breast cancer Half-Sister 3   Uterine cancer Niece 71   Breast cancer Niece        diagnosed mid-60s   Lung cancer Nephew    Bone cancer Nephew 9   Cancer Niece 67       unknown type   Alzheimer's disease Maternal Aunt    Breast cancer Other        maternal great-aunt, diagnosed in her 18s   Breast cancer Other        4th degree maternal relative, diagnosed in her 49s   Colon cancer Neg Hx    Esophageal cancer Neg Hx    Pancreatic cancer Neg Hx    Rectal cancer Neg Hx    Stomach cancer Neg Hx    Inflammatory bowel disease Neg Hx    Liver disease Neg Hx    Social History   Socioeconomic History   Marital status: Widowed    Spouse name: Not on file   Number of children: Not on file   Years of education: Not on file   Highest education level: Not on file  Occupational History   Not on file  Tobacco Use   Smoking status: Never   Smokeless tobacco: Never  Vaping Use   Vaping Use: Never used  Substance and Sexual Activity   Alcohol use: Never    Comment: no    Drug use: Never   Sexual  activity: Yes  Other Topics Concern   Not on file  Social History Narrative   Married 1971. 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    Social Determinants of Health   Financial Resource Strain: Low Risk  (02/23/2023)   Overall Financial Resource Strain (CARDIA)    Difficulty of Paying Living Expenses: Not hard at all  Food Insecurity: No Food Insecurity (02/23/2023)   Hunger Vital Sign    Worried About Running Out of Food in the Last Year: Never true    Ran Out of Food in the Last Year: Never true  Transportation Needs: No Transportation Needs (02/23/2023)   PRAPARE - Administrator, Civil Service (Medical): No    Lack of Transportation (Non-Medical): No  Physical Activity: Sufficiently Active (02/23/2023)   Exercise Vital Sign    Days of Exercise per Week: 5 days    Minutes of Exercise per Session: 60 min  Stress: No Stress Concern Present (02/23/2023)   [employer] of Occupational Health - Occupational Stress Questionnaire    Feeling of Stress : Not at all  Social Connections: Moderately Isolated (02/23/2023)   Social Connection and Isolation Panel [NHANES]    Frequency of Communication with Friends and Family: More than three times a week    Frequency of Social Gatherings with Friends and Family: More than three times a week    Attends Religious Services: 1 to 4 times per year    Active Member of [employer] or Organizations: No    Attends Banker Meetings: Never    Marital Status: Widowed    Tobacco Counseling Counseling given: Not Answered   Clinical Intake:  Pre-visit preparation completed: Yes  Pain : No/denies pain     BMI - recorded: 33.99 Nutritional Status: BMI > 30  Obese Nutritional  Risks: None Diabetes: Yes CBG done?: No Did pt. bring in CBG  monitor from home?: No  How often do you need to have someone help you when you read instructions, pamphlets, or other written materials from your doctor or pharmacy?: 1 - Never  Diabetic?Nutrition Risk Assessment:  Has the patient had any N/V/D within the last 2 months?  No  Does the patient have any non-healing wounds?  No  Has the patient had any unintentional weight loss or weight gain?  No   Diabetes:  Is the patient diabetic?  Yes  If diabetic, was a CBG obtained today?  No  Did the patient bring in their glucometer from home?  No  How often do you monitor your CBG's?n/a.   Financial Strains and Diabetes Management:  Are you having any financial strains with the device, your supplies or your medication? No .  Does the patient want to be seen by Chronic Care Management for management of their diabetes?  No  Would the patient like to be referred to a Nutritionist or for Diabetic Management?  No   Diabetic Exams:  Diabetic Eye Exam: Completed 03/13/22 Diabetic Foot Exam: Completed 03/17/22   Interpreter Needed?: No  Information entered by :: Lanier Ensign, LPN   Activities of Daily Living    02/23/2023   10:40 AM  In your present state of health, do you have any difficulty performing the following activities:  Hearing? 0  Vision? 0  Difficulty concentrating or making decisions? 0  Walking or climbing stairs? 0  Dressing or bathing? 0  Doing errands, shopping? 0  Preparing Food and eating ? N  Using the Toilet? N  In the past six months, have you accidently leaked urine? N  Comment wears a mini pad at times in case  Do you have problems with loss of bowel control? Y  Managing your Medications? N  Managing your Finances? N  Housekeeping or managing your Housekeeping? N    Patient Care Team: Shelva Majestic, MD as PCP - General (Family Medicine) Iva Boop, MD as Consulting Physician (Gastroenterology) Mansouraty, Netty Starring., MD as Consulting Physician  (Gastroenterology) Ladene Artist, MD as Consulting Physician (Oncology) Aris Lot, MD as Consulting Physician (Dermatology) Dahlia Byes, [facility] (Inactive) as Pharmacist (Pharmacist)  Indicate any recent Medical Services you may have received from other than [facility] providers in the past year (date may be approximate).     Assessment:   This is a routine wellness examination for Bijan.  Hearing/Vision screen Hearing Screening - Comments:: Pt denies any hearing issues  Vision Screening - Comments:: Pt follows up with Dr Yet[facility]re [facility]   Dietary issues and exercise activities discussed: Current Exercise Habits: Home exercise routine, Type of exercise: Other - see comments, Time (Minutes): 60, Frequency (Times/Week): 5, Weekly Exercise (Minutes/Week): 300   Goals Addressed               This Visit's Progress     stay as active as I can (pt-stated)         Depression Screen    02/23/2023   10:37 AM 02/03/2022   10:07 AM 09/09/2021    1:09 PM 02/14/2021   11:34 AM 02/14/2020   10:40 AM 03/09/2019   10:34 AM 07/19/2018   10:22 AM  PHQ 2/9 Scores  PHQ - 2 Score 1 1 0 0 0 0 0  PHQ- 9 Score     1 1  Fall Risk    02/23/2023   10:39 AM 06/26/2022    4:34 PM 02/03/2022   10:11 AM 12/17/2021   11:15 AM 09/09/2021    1:09 PM  Fall Risk   Falls in the past year? 1 0 0 0 0  Number falls in past yr: 1 0 0 0 0  Injury with Fall? 1 0 0 0   Comment sore      Risk for fall due to : Impaired vision No Fall Risks Impaired vision    Follow up Falls prevention discussed Falls evaluation completed Falls prevention discussed      FALL RISK PREVENTION PERTAINING TO THE HOME:  Any stairs in or around the home? Yes  If so, are there any without handrails? Yes  Home free of loose throw rugs in walkways, pet beds, electrical cords, etc? Yes  Adequate lighting in your home to reduce risk of falls? Yes   ASSISTIVE DEVICES UTILIZED TO PREVENT FALLS:  Life  alert? No  Use of a cane, walker or w/c? No  Grab bars in the bathroom? Yes  Shower chair or bench in shower? No  Elevated toilet seat or a handicapped toilet? No   TIMED UP AND GO:  Was the test performed? No .   Cognitive Function:        02/23/2023   10:41 AM 02/03/2022   10:14 AM  6CIT Screen  What Year? 0 points 0 points  What month? 0 points 0 points  What time? 0 points 0 points  Count back from 20 0 points 0 points  Months in reverse 0 points 0 points  Repeat phrase 0 points 0 points  Total Score 0 points 0 points    Immunizations Immunization History  Administered Date(s) Administered   Influenza Split 06/30/2011, 06/29/2012, 06/08/2022   Influenza Whole 07/26/2007, 06/15/2008, 07/25/2009   Influenza, High Dose Seasonal PF 06/23/2019   Influenza,inj,Quad PF,6+ Mos 07/10/2013, 06/13/2014, 06/14/2015, 05/14/2016, 05/27/2017, 07/19/2018   Influenza-Unspecified 05/23/2020, 06/17/2021   PFIZER Comirnaty(Gray Top)Covid-19 Tri-Sucrose Vaccine 04/23/2021   PFIZER(Purple Top)SARS-COV-2 Vaccination 10/14/2019, 11/04/2019, 06/28/2020   PNEUMOCOCCAL CONJUGATE-20 03/17/2022   Pfizer Covid-19 Vaccine Bivalent Booster 50yrs & up 07/04/2021, 06/22/2022   Pneumococcal Polysaccharide-23 07/26/2014, 02/14/2020   Respiratory Syncytial Virus Vaccine,Recomb Aduvanted(Arexvy) 09/02/2022   Td 09/22/2003   Tdap 03/18/2018   Zoster Recombinat (Shingrix) 03/03/2022, 06/08/2022    TDAP status: Up to date  Flu Vaccine status: Up to date  Pneumococcal vaccine status: Up to date  Covid-19 vaccine status: Completed vaccines  Qualifies for Shingles Vaccine? Yes   Zostavax completed Yes   Shingrix Completed?: Yes  Screening Tests Health Maintenance  Topic Date Due   COVID-19 Vaccine (7 - 2023-24 season) 08/17/2022   Diabetic kidney evaluation - Urine ACR  03/18/2023   HEMOGLOBIN A1C  03/11/2023   OPHTHALMOLOGY EXAM  03/14/2023   FOOT EXAM  03/18/2023   INFLUENZA VACCINE   04/22/2023   Diabetic kidney evaluation - eGFR measurement  09/10/2023   Medicare Annual Wellness (AWV)  02/23/2024   Colonoscopy  06/12/2024   MAMMOGRAM  09/18/2024   DTaP/Tdap/Td (3 - Td or Tdap) 03/18/2028   Pneumonia Vaccine 44+ Years old  Completed   DEXA SCAN  Completed   Hepatitis C Screening  Completed   Zoster Vaccines- Shingrix  Completed   HPV VACCINES  Aged Out    Health Maintenance  Health Maintenance Due  Topic Date Due   COVID-19 Vaccine (7 - 2023-24 season) 08/17/2022   Diabetic kidney  evaluation - Urine ACR  03/18/2023    Colorectal cancer screening: Type of screening: Colonoscopy. Completed 06/12/21. Repeat every 3 years  Mammogram status: Completed 09/18/22. Repeat every year  Bone Density status: Completed 02/23/20. Results reflect: Bone density results: OSTEOPENIA. Repeat every 2 years.  Additional Screening:  Hepatitis C Screening:  Completed 12/23/16  Vision Screening: Recommended annual ophthalmology exams for early detection of glaucoma and other disorders of the eye. Is the patient up to date with their annual eye exam?  Yes  Who is the provider or what is the name of the office in which the patient attends annual eye exams? Dr Yetta Barre  If pt is not established with a provider, would they like to be referred to a provider to establish care? No .   Dental Screening: Recommended annual dental exams for proper oral hygiene  Community Resource Referral / Chronic Care Management: CRR required this visit?  No   CCM required this visit?  No      Plan:     I have personally reviewed and noted the following in the patient's chart:   Medical and social history Use of alcohol, tobacco or illicit drugs  Current medications and supplements including opioid prescriptions. Patient is not currently taking opioid prescriptions. Functional ability and status Nutritional status Physical activity Advanced directives List of other physicians Hospitalizations,  surgeries, and ER visits in previous 12 months Vitals Screenings to include cognitive, depression, and falls Referrals and appointments  In addition, I have reviewed and discussed with patient certain preventive protocols, quality metrics, and best practice recommendations. A written personalized care plan for preventive services as well as general preventive health recommendations were provided to patient.     Marzella Schlein, LPN   09/26/1094   Nurse Notes: none

## 2023-02-24 ENCOUNTER — Ambulatory Visit (INDEPENDENT_AMBULATORY_CARE_PROVIDER_SITE_OTHER): Payer: Medicare Other

## 2023-02-24 DIAGNOSIS — Z7901 Long term (current) use of anticoagulants: Secondary | ICD-10-CM | POA: Diagnosis not present

## 2023-02-24 LAB — POCT INR: INR: 3.3 — AB (ref 2.0–3.0)

## 2023-02-24 NOTE — Patient Instructions (Addendum)
Pre visit review using our clinic review tool, if applicable. No additional management support is needed unless otherwise documented below in the visit note.  Continue 1 tablet daily except take 1/2 tablet on Mondays and Thursdays. Recheck INR in 6 weeks.  

## 2023-02-24 NOTE — Progress Notes (Signed)
I have reviewed and agree with note, evaluation, plan.   Eloise Mula, MD  

## 2023-02-24 NOTE — Progress Notes (Signed)
Continue 1 tablet daily except take 1/2 tablet on Mondays and Thursdays. Recheck INR in 6 weeks.  

## 2023-02-28 ENCOUNTER — Other Ambulatory Visit: Payer: Self-pay | Admitting: Family Medicine

## 2023-03-19 ENCOUNTER — Ambulatory Visit (INDEPENDENT_AMBULATORY_CARE_PROVIDER_SITE_OTHER): Payer: Medicare Other | Admitting: Family Medicine

## 2023-03-19 ENCOUNTER — Encounter: Payer: Self-pay | Admitting: Family Medicine

## 2023-03-19 VITALS — BP 124/78 | HR 96 | Temp 97.7°F | Ht 64.0 in | Wt 196.2 lb

## 2023-03-19 DIAGNOSIS — D509 Iron deficiency anemia, unspecified: Secondary | ICD-10-CM

## 2023-03-19 DIAGNOSIS — E559 Vitamin D deficiency, unspecified: Secondary | ICD-10-CM

## 2023-03-19 DIAGNOSIS — I10 Essential (primary) hypertension: Secondary | ICD-10-CM | POA: Diagnosis not present

## 2023-03-19 DIAGNOSIS — Z0000 Encounter for general adult medical examination without abnormal findings: Secondary | ICD-10-CM

## 2023-03-19 DIAGNOSIS — N183 Chronic kidney disease, stage 3 unspecified: Secondary | ICD-10-CM

## 2023-03-19 DIAGNOSIS — E785 Hyperlipidemia, unspecified: Secondary | ICD-10-CM

## 2023-03-19 DIAGNOSIS — D6861 Antiphospholipid syndrome: Secondary | ICD-10-CM | POA: Diagnosis not present

## 2023-03-19 DIAGNOSIS — E039 Hypothyroidism, unspecified: Secondary | ICD-10-CM

## 2023-03-19 DIAGNOSIS — E1169 Type 2 diabetes mellitus with other specified complication: Secondary | ICD-10-CM

## 2023-03-19 DIAGNOSIS — I480 Paroxysmal atrial fibrillation: Secondary | ICD-10-CM | POA: Diagnosis not present

## 2023-03-19 DIAGNOSIS — G63 Polyneuropathy in diseases classified elsewhere: Secondary | ICD-10-CM

## 2023-03-19 DIAGNOSIS — R7989 Other specified abnormal findings of blood chemistry: Secondary | ICD-10-CM

## 2023-03-19 LAB — CBC WITH DIFFERENTIAL/PLATELET
Basophils Absolute: 0.1 10*3/uL (ref 0.0–0.1)
Basophils Relative: 1.4 % (ref 0.0–3.0)
Eosinophils Absolute: 0.4 10*3/uL (ref 0.0–0.7)
Eosinophils Relative: 5.1 % — ABNORMAL HIGH (ref 0.0–5.0)
HCT: 42.8 % (ref 36.0–46.0)
Hemoglobin: 14.4 g/dL (ref 12.0–15.0)
Lymphocytes Relative: 23.1 % (ref 12.0–46.0)
Lymphs Abs: 1.6 10*3/uL (ref 0.7–4.0)
MCHC: 33.7 g/dL (ref 30.0–36.0)
MCV: 89.8 fl (ref 78.0–100.0)
Monocytes Absolute: 0.5 10*3/uL (ref 0.1–1.0)
Monocytes Relative: 6.7 % (ref 3.0–12.0)
Neutro Abs: 4.5 10*3/uL (ref 1.4–7.7)
Neutrophils Relative %: 63.7 % (ref 43.0–77.0)
Platelets: 208 10*3/uL (ref 150.0–400.0)
RBC: 4.77 Mil/uL (ref 3.87–5.11)
RDW: 13.2 % (ref 11.5–15.5)
WBC: 7.1 10*3/uL (ref 4.0–10.5)

## 2023-03-19 LAB — COMPREHENSIVE METABOLIC PANEL
ALT: 17 U/L (ref 0–35)
AST: 14 U/L (ref 0–37)
Albumin: 4.7 g/dL (ref 3.5–5.2)
Alkaline Phosphatase: 61 U/L (ref 39–117)
BUN: 20 mg/dL (ref 6–23)
CO2: 30 mEq/L (ref 19–32)
Calcium: 10.1 mg/dL (ref 8.4–10.5)
Chloride: 101 mEq/L (ref 96–112)
Creatinine, Ser: 1.22 mg/dL — ABNORMAL HIGH (ref 0.40–1.20)
GFR: 45.42 mL/min — ABNORMAL LOW (ref >60.00)
Glucose, Bld: 110 mg/dL — ABNORMAL HIGH (ref 70–99)
Potassium: 4 mEq/L (ref 3.5–5.1)
Sodium: 140 mEq/L (ref 135–145)
Total Bilirubin: 0.9 mg/dL (ref 0.2–1.2)
Total Protein: 7.2 g/dL (ref 6.0–8.3)

## 2023-03-19 LAB — HEMOGLOBIN A1C: Hgb A1c MFr Bld: 6.3 % (ref 4.6–6.5)

## 2023-03-19 LAB — IBC + FERRITIN
Ferritin: 44.6 ng/mL (ref 10.0–291.0)
Iron: 92 ug/dL (ref 42–145)
Saturation Ratios: 22.9 % (ref 20.0–50.0)
TIBC: 401.8 ug/dL (ref 250.0–450.0)
Transferrin: 287 mg/dL (ref 212.0–360.0)

## 2023-03-19 LAB — VITAMIN D 25 HYDROXY (VIT D DEFICIENCY, FRACTURES): VITD: 49.82 ng/mL (ref 30.00–100.00)

## 2023-03-19 LAB — LIPID PANEL
Cholesterol: 179 mg/dL (ref 0–200)
HDL: 52.3 mg/dL (ref >39.00)
LDL Cholesterol: 95 mg/dL (ref 0–99)
NonHDL: 127.14
Total CHOL/HDL Ratio: 3
Triglycerides: 159 mg/dL — ABNORMAL HIGH (ref 0.0–149.0)
VLDL: 31.8 mg/dL (ref 0.0–40.0)

## 2023-03-19 LAB — MICROALBUMIN / CREATININE URINE RATIO
Creatinine,U: 73.7 mg/dL
Microalb Creat Ratio: 0.9 mg/g (ref 0.0–30.0)
Microalb, Ur: <0.7 mg/dL (ref 0.0–1.9)

## 2023-03-19 LAB — TSH: TSH: 2.28 u[IU]/mL (ref 0.35–5.50)

## 2023-03-19 LAB — VITAMIN B12: Vitamin B-12: >1500 pg/mL — ABNORMAL HIGH (ref 211–911)

## 2023-03-19 NOTE — Progress Notes (Signed)
Phone [phone]   Subjective:  Patient presents today for their annual physical. Chief complaint-noted.   See problem oriented charting- ROS- full  review of systems was completed and negative Per full ROS sheet completed by patient  The following were reviewed and entered/updated in epic: Past Medical History:  Diagnosis Date   Allergy    Anxiety    Arthritis    Asthma    many years ago   Blood transfusion without reported diagnosis    Cataract    Chronic kidney disease    Clotting disorder (HCC)    Depression    Husband died 22-Jun-2019   Diabetes (HCC)    not on medication   DVT (deep venous thrombosis) (HCC)    left arm   Dysrhythmia    Family history of bone cancer    Family history of breast cancer    Family history of lung cancer    Family history of ovarian cancer    Family history of testicular cancer    Family history of uterine cancer    GERD (gastroesophageal reflux disease)    Headache    Hypertension    Hypothyroidism    Iron deficiency anemia 03/30/2019   Irregular heart beat    tachycardia   Lupus anticoagulant with hypercoagulable state (HCC)    Melanoma (HCC)    2005- face    Paroxysmal atrial fibrillation (HCC)    Plantar fasciitis    Pneumonia 2019   Primary hypercoagulable state (HCC)    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   Pulmonary embolism (HCC)    Thyroid disease    hypothyroidism   Patient Active Problem List   Diagnosis Date Noted   Neuroendocrine carcinoma of stomach (HCC) 07/12/2019    Priority: High   Elevated Chromogranin 07/02/2019    Priority: High   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) 05/12/2019    Priority: High   Low vitamin B12 level 09/07/2016    Priority: High   Disability examination 01/17/2015    Priority: High   Type 2 diabetes mellitus with obesity (HCC) 11/28/2010    Priority: High   PAROXYSMAL ATRIAL FIBRILLATION 07/02/2010     Priority: High   Primary hypercoagulable state (HCC) 11/30/2007    Priority: High   Osteopenia 02/26/2020    Priority: Medium    Vitamin D deficiency 07/20/2019    Priority: Medium    Iron deficiency anemia 03/30/2019    Priority: Medium    Hyperlipidemia associated with type 2 diabetes mellitus (HCC) 03/09/2019    Priority: Medium    Cough variant asthma 10/14/2018    Priority: Medium    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) 11/09/2017    Priority: Medium    Hx of adenomatous polyp of colon 04/10/2016    Priority: Medium    CKD (chronic kidney disease), stage III (HCC) 09/05/2014    Priority: Medium    Hypothyroidism 03/09/2007    Priority: Medium    Essential hypertension 03/09/2007    Priority: Medium    History of Melanoma 03/09/2007    Priority: Medium    Abnormal findings on esophagogastroduodenoscopy (EGD) 07/02/2019    Priority: Low   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    Priority: Low   Multiple gastric polyps 07/02/2019    Priority: Low   Long term (current) use of anticoagulants - warfarin 06/02/2017    Priority: Low   Encounter for therapeutic drug monitoring 10/26/2013    Priority: Low  Left facial pain 09/24/2012    Priority: Low   Oral mucosal lesion 09/24/2012    Priority: Low   TRANSIENT DISORDER INITIATING/MAINTAINING SLEEP 04/02/2010    Priority: Low   Overweight(278.02) 11/29/2009    Priority: Low   LOW BACK PAIN 06/15/2008    Priority: Low   ALLERGIC RHINITIS 03/09/2007    Priority: Low   GERD 03/09/2007    Priority: Low   Pancreatic lesion on EUS and NETSPOT scan ? neuroendocrine tumor 04/21/2020   Abnormal finding on radiology exam 04/21/2020   Intestinal metaplasia of gastric mucosa 04/21/2020   Acquired thrombophilia (HCC) 02/14/2020   Myalgia due to statin 02/14/2020   Genetic testing 08/15/2019   Family history of breast cancer    Family history of ovarian cancer    Family history of uterine cancer    Family history of lung cancer     Family history of bone cancer    Family history of testicular cancer    Past Surgical History:  Procedure Laterality Date   ABDOMINAL HYSTERECTOMY     noncancerous   BACK SURGERY  1983   BIOPSY  06/19/2020   Procedure: BIOPSY;  Surgeon: Lemar Lofty., MD;  Location: [facility];  Service: Gastroenterology;;   BIOPSY  06/12/2021   Procedure: BIOPSY;  Surgeon: Lemar Lofty., MD;  Location: [facility] ENDOSCOPY;  Service: Gastroenterology;;   BIOPSY  05/28/2022   Procedure: BIOPSY;  Surgeon: Iva Boop, MD;  Location: [facility] ENDOSCOPY;  Service: Gastroenterology;;   CHOLECYSTECTOMY     COLONOSCOPY     COLONOSCOPY WITH PROPOFOL N/A 06/12/2021   Procedure: COLONOSCOPY WITH PROPOFOL;  Surgeon: Lemar Lofty., MD;  Location: [facility] ENDOSCOPY;  Service: Gastroenterology;  Laterality: N/A;   ENDOSCOPIC MUCOSAL RESECTION N/A 07/12/2019   Procedure: ENDOSCOPIC MUCOSAL RESECTION;  Surgeon: Meridee Score Netty Starring., MD;  Location: [facility] ENDOSCOPY;  Service: Gastroenterology;  Laterality: N/A;   ENDOSCOPIC MUCOSAL RESECTION  06/19/2020   Procedure: ENDOSCOPIC MUCOSAL RESECTION;  Surgeon: Meridee Score Netty Starring., MD;  Location: [facility] ENDOSCOPY;  Service: Gastroenterology;;   ESOPHAGOGASTRODUODENOSCOPY N/A 06/19/2020   Procedure: ESOPHAGOGASTRODUODENOSCOPY (EGD);  Surgeon: Lemar Lofty., MD;  Location: [facility] ENDOSCOPY;  Service: Gastroenterology;  Laterality: N/A;   ESOPHAGOGASTRODUODENOSCOPY (EGD) WITH PROPOFOL N/A 07/12/2019   Procedure: ESOPHAGOGASTRODUODENOSCOPY (EGD) WITH PROPOFOL;  Surgeon: Meridee Score Netty Starring., MD;  Location: [facility] ENDOSCOPY;  Service: Gastroenterology;  Laterality: N/A;   ESOPHAGOGASTRODUODENOSCOPY (EGD) WITH PROPOFOL N/A 10/09/2019   Procedure: ESOPHAGOGASTRODUODENOSCOPY (EGD) WITH PROPOFOL;  Surgeon: Meridee Score Netty Starring., MD;  Location: [facility] ENDOSCOPY;  Service: Gastroenterology;  Laterality: N/A;   ESOPHAGOGASTRODUODENOSCOPY (EGD) WITH PROPOFOL N/A 06/12/2021    Procedure: ESOPHAGOGASTRODUODENOSCOPY (EGD) WITH PROPOFOL;  Surgeon: Meridee Score Netty Starring., MD;  Location: [facility] ENDOSCOPY;  Service: Gastroenterology;  Laterality: N/A;   ESOPHAGOGASTRODUODENOSCOPY (EGD) WITH PROPOFOL N/A 05/28/2022   Procedure: ESOPHAGOGASTRODUODENOSCOPY (EGD) WITH PROPOFOL;  Surgeon: Iva Boop, MD;  Location: [facility] ENDOSCOPY;  Service: Gastroenterology;  Laterality: N/A;   EUS N/A 07/12/2019   Procedure: UPPER ENDOSCOPIC ULTRASOUND (EUS) RADIAL;  Surgeon: Lemar Lofty., MD;  Location: [facility] ENDOSCOPY;  Service: Gastroenterology;  Laterality: N/A;   EUS N/A 10/09/2019   Procedure: UPPER ENDOSCOPIC ULTRASOUND (EUS) RADIAL;  Surgeon: Lemar Lofty., MD;  Location: [facility] ENDOSCOPY;  Service: Gastroenterology;  Laterality: N/A;   fatty tumor breast Right    removed   FINE NEEDLE ASPIRATION  06/19/2020   Procedure: FINE NEEDLE ASPIRATION (FNA) LINEAR;  Surgeon: Lemar Lofty., MD;  Location: [facility] ENDOSCOPY;  Service: Gastroenterology;;  FOREIGN BODY REMOVAL  10/09/2019   Procedure: FOREIGN BODY REMOVAL;  Surgeon: Meridee Score Netty Starring., MD;  Location: [facility] ENDOSCOPY;  Service: Gastroenterology;;   FOREIGN BODY REMOVAL  06/19/2020   Procedure: FOREIGN BODY REMOVAL;  Surgeon: Meridee Score Netty Starring., MD;  Location: [facility] ENDOSCOPY;  Service: Gastroenterology;;  polyp removal with net    HEMOSTASIS CLIP PLACEMENT  07/12/2019   Procedure: HEMOSTASIS CLIP PLACEMENT;  Surgeon: Lemar Lofty., MD;  Location: [facility] ENDOSCOPY;  Service: Gastroenterology;;   HEMOSTASIS CLIP PLACEMENT  10/09/2019   Procedure: HEMOSTASIS CLIP PLACEMENT;  Surgeon: Lemar Lofty., MD;  Location: [facility] ENDOSCOPY;  Service: Gastroenterology;;   HEMOSTASIS CLIP PLACEMENT  06/19/2020   Procedure: HEMOSTASIS CLIP PLACEMENT;  Surgeon: Lemar Lofty., MD;  Location: [facility] ENDOSCOPY;  Service: Gastroenterology;;   HEMOSTASIS CLIP PLACEMENT  06/12/2021   Procedure: HEMOSTASIS CLIP PLACEMENT;   Surgeon: Lemar Lofty., MD;  Location: [facility] ENDOSCOPY;  Service: Gastroenterology;;   HEMOSTASIS CLIP PLACEMENT  05/28/2022   Procedure: HEMOSTASIS CLIP PLACEMENT;  Surgeon: Iva Boop, MD;  Location: [facility] ENDOSCOPY;  Service: Gastroenterology;;   HEMOSTASIS CONTROL  07/12/2019   Procedure: HEMOSTASIS CONTROL;  Surgeon: Lemar Lofty., MD;  Location: [facility] ENDOSCOPY;  Service: Gastroenterology;;   HYSTEROTOMY     KNEE ARTHROSCOPY Left    Lymp nodes Right 08/2004   MELANOMA EXCISION Right 2005   face    OOPHORECTOMY Bilateral    POLYPECTOMY  07/12/2019   Procedure: POLYPECTOMY;  Surgeon: Lemar Lofty., MD;  Location: [facility] ENDOSCOPY;  Service: Gastroenterology;;   POLYPECTOMY  10/09/2019   Procedure: POLYPECTOMY;  Surgeon: Lemar Lofty., MD;  Location: [facility] ENDOSCOPY;  Service: Gastroenterology;;   POLYPECTOMY  06/19/2020   Procedure: POLYPECTOMY;  Surgeon: Lemar Lofty., MD;  Location: [facility] ENDOSCOPY;  Service: Gastroenterology;;   POLYPECTOMY  06/12/2021   Procedure: POLYPECTOMY;  Surgeon: Lemar Lofty., MD;  Location: [facility] ENDOSCOPY;  Service: Gastroenterology;;   POLYPECTOMY  05/28/2022   Procedure: POLYPECTOMY;  Surgeon: Iva Boop, MD;  Location: [facility] ENDOSCOPY;  Service: Gastroenterology;;   removal lymphnodes in right neck     SUBMUCOSAL LIFTING INJECTION  07/12/2019   Procedure: SUBMUCOSAL LIFTING INJECTION;  Surgeon: Lemar Lofty., MD;  Location: [facility] ENDOSCOPY;  Service: Gastroenterology;;   SUBMUCOSAL LIFTING INJECTION  10/09/2019   Procedure: SUBMUCOSAL LIFTING INJECTION;  Surgeon: Lemar Lofty., MD;  Location: [facility] ENDOSCOPY;  Service: Gastroenterology;;   SUBMUCOSAL LIFTING INJECTION  06/12/2021   Procedure: SUBMUCOSAL LIFTING INJECTION;  Surgeon: Lemar Lofty., MD;  Location: [facility] ENDOSCOPY;  Service: Gastroenterology;;   SUBMUCOSAL LIFTING INJECTION  05/28/2022   Procedure: SUBMUCOSAL LIFTING INJECTION;   Surgeon: Iva Boop, MD;  Location: [facility] ENDOSCOPY;  Service: Gastroenterology;;   UPPER ESOPHAGEAL ENDOSCOPIC ULTRASOUND (EUS) N/A 06/19/2020   Procedure: UPPER ESOPHAGEAL ENDOSCOPIC ULTRASOUND (EUS);  Surgeon: Lemar Lofty., MD;  Location: [facility] ENDOSCOPY;  Service: Gastroenterology;  Laterality: N/A;   UPPER GASTROINTESTINAL ENDOSCOPY      Family History  Problem Relation Age of Onset   Coronary artery disease Mother        Mom 13, Dad 57-smoker   Diabetes Mother    Ovarian cancer Mother 51       not officially diagnosed   Hypertension Father    Diabetes Father    Mitral valve prolapse Sister    Diabetes Sister    Breast cancer Maternal Aunt 25   Breast cancer Half-Sister 50   Breast cancer Half-Sister 76   Uterine cancer  Niece 60   Breast cancer Niece        diagnosed mid-60s   Lung cancer Nephew    Bone cancer Nephew 17   Cancer Niece 54       unknown type   Alzheimer's disease Maternal Aunt    Breast cancer Other        maternal great-aunt, diagnosed in her 21s   Breast cancer Other        4th degree maternal relative, diagnosed in her 16s   Colon cancer Neg Hx    Esophageal cancer Neg Hx    Pancreatic cancer Neg Hx    Rectal cancer Neg Hx    Stomach cancer Neg Hx    Inflammatory bowel disease Neg Hx    Liver disease Neg Hx     Medications- reviewed and updated Current Outpatient Medications  Medication Sig Dispense Refill   ABRYSVO 120 MCG/0.5ML injection      acetaminophen (TYLENOL) 500 MG tablet Take 500 mg by mouth every 6 (six) hours as needed (for pain.).      Alum Hydroxide-Mag Trisilicate (GAVISCON) 80-14.2 MG CHEW Chew 1-2 tablets by mouth 2 (two) times daily as needed (indigestion).     Cholecalciferol (VITAMIN D3) 50 MCG (2000 UT) TABS Take 2,000 Units by mouth daily.     diltiazem (CARDIZEM CD) 360 MG 24 hr capsule Take 1 capsule by mouth at bedtime 90 capsule 0   docusate sodium (COLACE) 100 MG capsule Take 100 mg by mouth at bedtime. Stool  softner     ferrous sulfate 325 (65 FE) MG tablet Take 1 tablet (325 mg total) by mouth daily with breakfast.     levothyroxine (SYNTHROID) 100 MCG tablet Take 1 tablet by mouth once daily 90 tablet 0   lovastatin (MEVACOR) 10 MG tablet Take 1 tablet (10 mg total) by mouth once a week.     metoprolol succinate (TOPROL-XL) 25 MG 24 hr tablet Take 1 tablet by mouth once daily 90 tablet 0   omeprazole (PRILOSEC) 40 MG capsule Take 1 capsule (40 mg total) by mouth daily. 90 capsule 3   telmisartan (MICARDIS) 40 MG tablet Take 1 tablet by mouth once daily 90 tablet 0   triamterene-hydrochlorothiazide (MAXZIDE-25) 37.5-25 MG tablet Take 1 tablet by mouth once daily 90 tablet 0   vitamin B-12 (CYANOCOBALAMIN) 1000 MCG tablet Take 1,000 mcg by mouth daily.     warfarin (COUMADIN) 5 MG tablet AS DIRECTED BY  ANTICOAGULATION  CLINIC 105 tablet 0   Current Facility-Administered Medications  Medication Dose Route Frequency Provider Last Rate Last Admin   phenazopyridine (PYRIDIUM) tablet 100 mg  100 mg Oral TID WC Lula Olszewski, MD        Allergies-reviewed and updated Allergies  Allergen Reactions   Iohexol Anaphylaxis, Hives and Shortness Of Breath    Incident 1990, premedicated prior to obtaining IV contrast since.   Amoxicillin-Pot Clavulanate Swelling and Other (See Comments)    Facial swelling Did it involve swelling of the face/tongue/throat, SOB, or low BP? Yes Did it involve sudden or severe rash/hives, skin peeling, or any reaction on the inside of your mouth or nose? No Did you need to seek medical attention at a hospital or doctor's office? No When did it last happen?~7-8 years ago   If all above answers are "NO", may proceed with cephalosporin use.    Other     Contrast dye    Social History   Social History Narrative   Married  45. 1 child Mischel Rafferty (by [employer]) lives in [place] with 2 grandchildren girls 15 Chloe and 35 Dahlia Client in 2021      Retired after blood clot  Retail banker and gamble previously      Hobbies: grandkids, spending time with friends.    Objective  Objective:  BP 124/78   Pulse 96   Temp 97.7 F (36.5 C)   Ht 5\' 4"  (1.626 m)   Wt 196 lb 3.2 oz (89 kg)   LMP  (LMP Unknown)   SpO2 98%   BMI 33.68 kg/m  Gen: NAD, resting comfortably HEENT: Mucous membranes are moist. Oropharynx normal Neck: no thyromegaly CV: RRR no murmurs rubs or gallops Lungs: CTAB no crackles, wheeze, rhonchi Abdomen: soft/nontender/nondistended/normal bowel sounds. No rebound or guarding.  Ext: trace edema Skin: warm, dry Neuro: grossly normal, moves all extremities, PERRLA  Diabetic Foot Exam - Simple   Simple Foot Form Diabetic Foot exam was performed with the following findings: Yes 03/19/2023 10:33 AM  Visual Inspection No deformities, no ulcerations, no other skin breakdown bilaterally: Yes Sensation Testing Intact to touch and monofilament testing bilaterally: Yes Pulse Check Posterior Tibialis and Dorsalis pulse intact bilaterally: Yes Comments      Assessment and Plan   67 y.o. female presenting for annual physical.  Health Maintenance counseling: 1. Anticipatory guidance: Patient counseled regarding regular dental exams -q6 months, eye exams -yearly - trying to get copy,  avoiding smoking and second hand smoke , limiting alcohol to 1 beverage per day-doesn't drink , no illicit drugs .   2. Risk factor reduction:  Advised patient of need for regular exercise and diet rich and fruits and vegetables to reduce risk of heart attack and stroke.  Exercise- recumbent bike over hour most days- up to 2 hours lately unless doing yard work- may do 1 hour.  Diet/weight management-up 6 lbs in last year - down 2 lbs in last few months- working on reducing portion sizes- feels could cut down on sweets some more.  Wt Readings from Last 3 Encounters:  03/19/23 196 lb 3.2 oz (89 kg)  02/23/23 198 lb (89.8 kg)  01/19/23 198 lb (89.8 kg)  3.  Immunizations/screenings/ancillary studies= COVID vaccine had last fall- encouraged to consider in fall again, diabetes eye exam requested Immunization History  Administered Date(s) Administered   Influenza Split 06/30/2011, 06/29/2012, 06/08/2022   Influenza Whole 07/26/2007, 06/15/2008, 07/25/2009   Influenza, High Dose Seasonal PF 06/23/2019   Influenza,inj,Quad PF,6+ Mos 07/10/2013, 06/13/2014, 06/14/2015, 05/14/2016, 05/27/2017, 07/19/2018   Influenza-Unspecified 05/23/2020, 06/17/2021   PFIZER Comirnaty(Gray Top)Covid-19 Tri-Sucrose Vaccine 04/23/2021   PFIZER(Purple Top)SARS-COV-2 Vaccination 10/14/2019, 11/04/2019, 06/28/2020   PNEUMOCOCCAL CONJUGATE-20 03/17/2022   Pfizer Covid-19 Vaccine Bivalent Booster 91yrs & up 07/04/2021, 06/22/2022   Pneumococcal Polysaccharide-23 07/26/2014, 02/14/2020   Respiratory Syncytial Virus Vaccine,Recomb Aduvanted(Arexvy) 09/02/2022   Td 09/22/2003   Tdap 03/18/2018   Zoster Recombinat (Shingrix) 03/03/2022, 06/08/2022  4. Cervical cancer screening- still sees Dr. Billy Coast- seen in December usually 5. Breast cancer screening-  breast exam with DR. Taavon and mammogram 09/18/22 6. Colon cancer screening - 06/12/21 with q3 year planned - gets regularly with DR. 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 Skin cancer screening- Dr. Doreen Beam [place] derm in decemberadvised regular sunscreen use. Denies worrisome, changing, or new skin lesions.  8. Birth control/STD check- not sexually active 9. Osteoporosis screening at 65- mild osteopenia- prefers to wait until next year for next bone density 10. 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 Meridee Score . follow chromogranin levels and PET scans. EGD yearly now-plans to call for next. Last seen by oncology in April of this year.   # primary  hypercoagulable state with history of multiple DVT and PE #acquired thrombophilia on coumadin S: patient remains on  Coumadin long term -requires lovenox bridge for procedures.  A/P: noted but stable without recurrence of clots- continue current medications    # Atrial fibrillation S: Rate controlled with  diltiazem 360mg  ER and metoprolol 25 mg XR Anticoagulated with  warfarin. Also requires lovenox bridge for procedures A/P: appropriately anticoagulated and rate controlled- continue current medicine   # Iron deficiency anemia- iron levels have been low- currently taking ferrous sulfate daily. Also on high dose PPI through GI. We will update iron levels Lab Results  Component Value Date   FERRITIN 93.4 03/17/2022   #hypertension S: medication:  diltiazem 360mg  XR, metoprolol 25 mg XR, telmisartan 40mg , triamterene hctz 37.5-25 mg BP Readings from Last 3 Encounters:  03/19/23 124/78  01/19/23 116/76  09/09/22 118/76  A/P: stable- continue current medicines   #hyperlipidemia- she prefers to stay on lowest dose possible S: Medication: lovastatin 10 mg once a week Lab Results  Component Value Date   CHOL 180 03/17/2022   HDL 57.80 03/17/2022   LDLCALC 94 03/17/2022   LDLDIRECT 39.0 03/10/2019   TRIG 139.0 03/17/2022   CHOLHDL 3 03/17/2022   A/P: reasonable control- continue current medications   # Diabetes S: Medication: diet controlled Lab Results  Component Value Date   HGBA1C 6.1 09/09/2022   HGBA1C 6.2 03/17/2022   HGBA1C 6.3 09/09/2021   A/P: hopefully stable- update a1c today. Continue without meds for now   #hypothyroidism S: compliant On thyroid medication- levothyroxine 100 mcg Lab Results  Component Value Date   TSH 2.02 09/09/2022  A/P:hopefully stable- update tsh today. Continue current meds for now    # B12 deficiency S: history of significant neuropathy with this. Metformin likely contributed. Doing well now on oral medication after time on injectoins.    Lab Results  Component Value Date   VITAMINB12 >1504 (H) 03/17/2022  A/P: hopefully stable- update b12 today. Continue current meds for now    #Vitamin D deficiency S: Medication: 2000 units daily Last vitamin D Lab Results  Component Value Date   VD25OH 66.68 03/17/2022  A/P:  hopefully stable- update vitamin D today. Continue current meds for now    Recommended follow up: Return in about 6 months (around 09/18/2023) for followup or sooner if needed.Schedule b4 you leave. Future Appointments  Date Time Provider Department Center  04/07/2023 10:30 AM [facility] COUMADIN CLINIC [facility]  07/14/2023 10:30 AM [facility] PHLEBOTOMIST [facility] None  07/21/2023 11:40 AM Ladene Artist, MD [facility] None  02/29/2024  9:45 AM [facility] ANNUAL WELLNESS VISIT 1 [facility]   Lab/Order associations: fasting   ICD-10-CM   1. Routine general medical examination at a health care facility  Z00.00     2. 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 Type 2 diabetes mellitus with obesity (HCC) Chronic E11.69 Urine Microalbumin w/creat. ratio   E66.9 Hemoglobin A1c    4. Paroxysmal atrial fibrillation (HCC) Chronic I48.0     5. Antiphospholipid antibody syndrome (HCC) Chronic D68.61     6. Stage 3 chronic kidney disease, unspecified whether stage  3a or 3b CKD (HCC) Chronic N18.30     7. Low vitamin B12 level  R79.89 Vitamin B12    8. Type 2 diabetes mellitus with obesity (HCC)  E11.69    E66.9     9. Hypothyroidism, unspecified type  E03.9 TSH    10. Essential hypertension  I10     11. Hyperlipidemia associated with type 2 diabetes mellitus (HCC)  E11.69 CBC with Differential/Platelet   E78.5 Lipid panel    Comprehensive metabolic panel    12. Vitamin D deficiency  E55.9 VITAMIN D 25 Hydroxy (Vit-D Deficiency, Fractures)    13. Iron deficiency anemia, unspecified iron deficiency anemia type  D50.9 IBC + Ferritin      No orders of the defined types were placed in  this encounter.   Return precautions advised.  Tana Conch, MD

## 2023-03-19 NOTE — Patient Instructions (Addendum)
Have diabetic eye exam sent to [place] when you have it done next week at  [phone].  Let us know if you get any covid vaccines this fall.  Please stop by lab before you go If you have mychart- we will send your results within 3 business days of [place] receiving them.  If you do not have mychart- we will call you about results within 5 business days of [place] receiving them.  *please also note that you will see labs on mychart as soon as they post. I will later go in and write notes on them- will say "notes from Dr. Durene Cal"   Recommended follow up: Return in about 6 months (around 09/18/2023) for followup or sooner if needed.Schedule b4 you leave.

## 2023-04-07 ENCOUNTER — Ambulatory Visit: Payer: Medicare Other

## 2023-04-07 DIAGNOSIS — Z7901 Long term (current) use of anticoagulants: Secondary | ICD-10-CM

## 2023-04-07 LAB — POCT INR: INR: 3.5 — AB (ref 2.0–3.0)

## 2023-04-07 NOTE — Progress Notes (Signed)
Continue 1 tablet daily except take 1/2 tablet on Mondays and Thursdays. Recheck INR in 6 weeks.  

## 2023-04-07 NOTE — Patient Instructions (Addendum)
Pre visit review using our clinic review tool, if applicable. No additional management support is needed unless otherwise documented below in the visit note.  Continue 1 tablet daily except take 1/2 tablet on Mondays and Thursdays. Recheck INR in 6 weeks.

## 2023-04-18 ENCOUNTER — Other Ambulatory Visit: Payer: Self-pay | Admitting: Family Medicine

## 2023-04-20 ENCOUNTER — Telehealth: Payer: Self-pay | Admitting: Internal Medicine

## 2023-04-20 NOTE — Telephone Encounter (Signed)
Pt stated that she had a PET scan in march, Pt stated that nothing has changed and it is still showing cancer in the bottom of stomach and still the spot on her pancreas. Pt stated that Dr Truett Perna wants the procedure done prior to her next Office visit with him at the end of October. Pt stated that she was under the understanding that Dr. Truett Perna was going to contact Dr. Leone Payor in April. Please review and advise .

## 2023-04-20 NOTE — Telephone Encounter (Signed)
Inbound call from patient ready to schedule for egd. Patient states she previously spoke with someone ad was advised she would have procedure done at the hospital. Please advise on scheduling. Patient states she spoke with her oncologist Dr Myrle Sheng and he advised for her to have the procedure done by October 30 th , which is her next ov with that provider.  Thank you

## 2023-04-25 NOTE — Telephone Encounter (Signed)
Let us set her up for 9/24 at [facility]  Will need to hold warfarin 5 d ahead and do lovenox bridge as before - PCP Dr. Durene Cal has helped in past

## 2023-04-26 ENCOUNTER — Telehealth: Payer: Self-pay

## 2023-04-26 ENCOUNTER — Other Ambulatory Visit: Payer: Self-pay

## 2023-04-26 DIAGNOSIS — C7A8 Other malignant neuroendocrine tumors: Secondary | ICD-10-CM

## 2023-04-26 DIAGNOSIS — K317 Polyp of stomach and duodenum: Secondary | ICD-10-CM

## 2023-04-26 NOTE — Telephone Encounter (Signed)
I'm ok with bridge. Carollee Herter with our Coumadin clinic does amazing job setting this up

## 2023-04-26 NOTE — Telephone Encounter (Signed)
[facility]  Pre-operative Risk Assessment     Request for surgical clearance:     Endoscopy Procedure  What type of surgery is being performed?     Upper Endoscopy  When is this surgery scheduled?     06/15/2023  What type of clearance is required ?   Medical/ Pharmacy  Are there any medications that need to be held prior to surgery and how long? Warfarin 5 days ahead and please do Lovenox bridge as before  Practice name and name of physician performing surgery?      [facility]/ Dr. Leone Payor  What is your office phone and fax number?      Phone- [phone]  Fax- [phone]  Anesthesia type (None, local, MAC, general) ?       MAC

## 2023-04-26 NOTE — Telephone Encounter (Signed)
Pt was notified of Dr. Leone Payor recommendations:  Pt was scheduled for the Upper Endoscopy on 06/15/2023 at [facility] at 9:30 AM with Dr. Leone Payor. Pt made aware.  Pt was scheduled for a previsit appointment on 05/27/2023 at 11:00 AM. Pt made aware. Location provided.   Message sent to Dr. Annice Pih for clearance and assistance with holding the warfarin 5 days ahead and doing a Lovenox bridge as before. Pt made aware.  Pt verbalized understanding with all questions answered.

## 2023-04-27 NOTE — Telephone Encounter (Signed)
Procedure: Endoscopy, 06/15/23 Current Wt: 89 kg CrCl: 44.66 mL/min using adjusted weight (69 kg) Lovenox: 120 mg, every day, 10 syringes.  Current warfarin dosing is take 5 mg daily except take 2.5 mg on Monday and Thursday. Next coumadin clinic apt is 8/28. Will educate pt at this apt on lovenox bridge schedule.  9/19: Take last dose of warfarin 9/20: NO warfarin, NO Lovenox 9/21: NO warfarin, Lovenox once in AM 9/22: NO warfarin, Lovenox once in AM 9/23: NO warfarin, Lovenox once in AM (BEFORE 7 AM)  9/24: SURGERY; NO WARFARIN, NO LOVENOX  9/25: Take 1 1/2 tablets (7.5 mg) warfarin, Lovenox once in AM 9/26: Take 1 tablet (5 mg) warfarin, Lovenox once in AM 9/27: Take 1 1/2 tablets (7.5 mg) warfarin, Lovenox once in AM 9/28: Take 1 1/2 tablets (7.5 mg) warfarin, Lovenox once in AM 9/29: Take 1 tablet (5 mg) warfarin, Lovenox once in AM 9/30: Take 1/2 tablet (2.5 mg) warfarin, Lovenox in AM 10/1: Take 1 tablet (5 mg) warfarin, Lovenox in AM 10/2: Recheck INR; NO WARFARIN, NO LOVENOX UNTIL RECHECK

## 2023-04-27 NOTE — Telephone Encounter (Signed)
Perfect thank you!

## 2023-04-27 NOTE — Telephone Encounter (Signed)
Contacted pt and advised lovenox bridge schedule has been created and instructions will be given at her coumadin clinic apt on 8/28. Pt verbalized understanding.

## 2023-05-17 ENCOUNTER — Other Ambulatory Visit: Payer: Self-pay | Admitting: Family Medicine

## 2023-05-18 NOTE — Patient Instructions (Signed)
Pre visit review using our clinic review tool, if applicable. No additional management support is needed unless otherwise documented below in the visit note. 

## 2023-05-19 ENCOUNTER — Ambulatory Visit (INDEPENDENT_AMBULATORY_CARE_PROVIDER_SITE_OTHER): Payer: Medicare Other

## 2023-05-19 DIAGNOSIS — Z7901 Long term (current) use of anticoagulants: Secondary | ICD-10-CM

## 2023-05-19 LAB — POCT INR: INR: 3.5 — AB (ref 2.0–3.0)

## 2023-05-19 MED ORDER — ENOXAPARIN SODIUM 120 MG/0.8ML IJ SOSY: 120.0000 mg | PREFILLED_SYRINGE | INTRAMUSCULAR | 0 refills | Status: DC

## 2023-05-19 NOTE — Progress Notes (Signed)
Endoscopy on 9/24 and will be on a lovenox bridge. Sent in lovenox. Continue 1 tablet daily except take 1/2 tablet on Mondays and Thursdays until starting dosing instructions below. 9/19: Take last dose of warfarin 9/20: NO warfarin, NO Lovenox 9/21: NO warfarin, Lovenox once in AM 9/22: NO warfarin, Lovenox once in AM 9/23: NO warfarin, Lovenox once in AM (BEFORE 7 AM)   9/24: SURGERY; NO WARFARIN, NO LOVENOX   9/25: Take 1 1/2 tablets (7.5 mg) warfarin, Lovenox once in AM 9/26: Take 1 tablet (5 mg) warfarin, Lovenox once in AM 9/27: Take 1 1/2 tablets (7.5 mg) warfarin, Lovenox once in AM 9/28: Take 1 1/2 tablets (7.5 mg) warfarin, Lovenox once in AM 9/29: Take 1 tablet (5 mg) warfarin, Lovenox once in AM 9/30: Take 1/2 tablet (2.5 mg) warfarin, Lovenox in AM 10/1: Take 1 tablet (5 mg) warfarin, Lovenox in AM 10/2: Recheck INR; NO WARFARIN, NO LOVENOX UNTIL RECHECK

## 2023-05-19 NOTE — Progress Notes (Signed)
I have reviewed and agree with note, evaluation, plan.   Stephen Hunter, MD  

## 2023-05-20 NOTE — Telephone Encounter (Signed)
Patient called in for an update. States she has only one pill left.

## 2023-05-24 ENCOUNTER — Other Ambulatory Visit: Payer: Self-pay | Admitting: Family Medicine

## 2023-05-27 ENCOUNTER — Ambulatory Visit (AMBULATORY_SURGERY_CENTER): Payer: Medicare Other | Admitting: *Deleted

## 2023-05-27 ENCOUNTER — Other Ambulatory Visit: Payer: Self-pay | Admitting: Internal Medicine

## 2023-05-27 ENCOUNTER — Other Ambulatory Visit: Payer: Self-pay | Admitting: Family Medicine

## 2023-05-27 VITALS — Ht 64.0 in | Wt 195.0 lb

## 2023-05-27 DIAGNOSIS — C7A092 Malignant carcinoid tumor of the stomach: Secondary | ICD-10-CM

## 2023-05-27 NOTE — Progress Notes (Signed)
Pt's name and DOB verified at the beginning of the pre-visit.  Pt denies any difficulty with ambulating,sitting, laying down or rolling side to side Gave both [facility] main # and MD on call # prior to instructions.  No egg or soy allergy known to patient  No issues known to pt with past sedation with any surgeries or procedures Pt denies having issues being intubated Pt has no issues moving head neck or swallowing No FH of Malignant Hyperthermia Pt is not on diet pills Pt is not on home 02  Pt is on blood thinners. Reviewed each days orders on how to take her blood thinners and pt verified they were correct since she reviewed the copy she was given as RN instructed her. Pt denies issues with constipation  Pt is not on dialysis Pt denise any abnormal heart rhythms  Pt denies any upcoming cardiac testing Pt encouraged to use to use Singlecare or Goodrx to reduce cost  Patient's chart reviewed by Erin Kirk CNRA prior to pre-visit and patient appropriate for the [facility].  Pre-visit completed and red dot placed by patient's name on their procedure day (on provider's schedule).  . Visit by phone Pt states weight is 195 lb Instructed pt why it is important to and  to call if they have any changes in health or new medications. Directed them to the # given and on instructions.   Pt states they will.  Instructions reviewed with pt and pt states understanding. Instructed to review again prior to procedure. Instructions sent by mail Pt made aware of time to arrive at hospital and that she will receive 4 phone calls from hospital and if she misses on to call them back ASP, Pt states she understood.

## 2023-05-28 ENCOUNTER — Other Ambulatory Visit: Payer: Self-pay

## 2023-05-28 MED ORDER — LEVOTHYROXINE SODIUM 100 MCG PO TABS: 100.0000 ug | ORAL_TABLET | Freq: Every day | ORAL | 2 refills | Status: DC

## 2023-05-31 NOTE — Telephone Encounter (Signed)
Patient requests RX be sent with refills (requests all RX's be sent with refills) for the following medication:  Prescription Request  05/31/2023  LOV: 03/19/2023  What is the name of the medication or equipment?  triamterene-hydrochlorothiazide (MAXZIDE-25) 37.5-25 MG tablet  Have you contacted your pharmacy to request a refill? Yes   Which pharmacy would you like this sent to?  [facility] [address] [place] - [address] [place] [address] Phone: [phone] Fax: [phone]    Patient notified that their request is being sent to the clinical staff for review and that they should receive a response within 2 business days.   Please advise at Mobile [phone] (mobile)

## 2023-06-02 ENCOUNTER — Other Ambulatory Visit: Payer: Self-pay | Admitting: Family Medicine

## 2023-06-08 ENCOUNTER — Encounter: Payer: Self-pay | Admitting: Internal Medicine

## 2023-06-08 ENCOUNTER — Other Ambulatory Visit: Payer: Self-pay

## 2023-06-08 NOTE — Progress Notes (Addendum)
Anesthesia Review:  PCP: Tana Conch LOV 03/19/23  Cardiologist : none  Chest x-ray : EKG : 05/28/22  Echo : Stress test: Cardiac Cath :  Activity level:  Sleep Study/ CPAP : none  Fasting Blood Sugar :      / Checks Blood Sugar -- times a day:   Blood Thinner/ Instructions /Last Dose: ASA / Instructions/ Last Dose :    Coumadin, Lovenox - Last dose of Coumadin on 06/10/23.    DM- diet and exercise controlled with diabetes    Had flu and coivd shot on 06/01/23

## 2023-06-14 NOTE — Anesthesia Preprocedure Evaluation (Addendum)
Anesthesia Evaluation  Patient identified by MRN, date of birth, ID band Patient awake    Reviewed: Allergy & Precautions, NPO status , Patient's Chart, lab work & pertinent test results  Airway Mallampati: II  TM Distance: >3 FB Neck ROM: Full    Dental no notable dental hx. (+) Teeth Intact, Dental Advisory Given   Pulmonary PE   Pulmonary exam normal breath sounds clear to auscultation       Cardiovascular hypertension, Pt. on home beta blockers and Pt. on medications + DVT  Normal cardiovascular exam Rhythm:Regular Rate:Normal     Neuro/Psych   Anxiety        GI/Hepatic ,GERD  ,,  Endo/Other  diabetes, Type 2    Renal/GU      Musculoskeletal  (+) Arthritis ,    Abdominal   Peds  Hematology   Anesthesia Other Findings   Reproductive/Obstetrics                             Anesthesia Physical Anesthesia Plan  ASA: 3  Anesthesia Plan: MAC   Post-op Pain Management:    Induction:   PONV Risk Score and Plan: 2 and Treatment may vary due to age or medical condition and Propofol infusion  Airway Management Planned: Natural Airway and Nasal Cannula  Additional Equipment: None  Intra-op Plan:   Post-operative Plan:   Informed Consent: I have reviewed the patients History and Physical, chart, labs and discussed the procedure including the risks, benefits and alternatives for the proposed anesthesia with the patient or authorized representative who has indicated his/her understanding and acceptance.     Dental advisory given  Plan Discussed with:   Anesthesia Plan Comments: (EGD for carcinoid)        Anesthesia Quick Evaluation

## 2023-06-15 ENCOUNTER — Encounter (HOSPITAL_COMMUNITY): Payer: Self-pay | Admitting: Internal Medicine

## 2023-06-15 ENCOUNTER — Ambulatory Visit (HOSPITAL_COMMUNITY): Payer: Medicare Other | Admitting: Anesthesiology

## 2023-06-15 ENCOUNTER — Ambulatory Visit (HOSPITAL_COMMUNITY)
Admission: RE | Admit: 2023-06-15 | Discharge: 2023-06-15 | Disposition: A | Discharge status: Home / Self Care | Payer: Medicare Other | Attending: Internal Medicine | Admitting: Internal Medicine

## 2023-06-15 ENCOUNTER — Encounter (HOSPITAL_COMMUNITY)
Admission: RE | Disposition: A | Discharge status: Home / Self Care | Payer: Self-pay | Source: Home / Self Care | Attending: Internal Medicine

## 2023-06-15 DIAGNOSIS — N189 Chronic kidney disease, unspecified: Secondary | ICD-10-CM | POA: Insufficient documentation

## 2023-06-15 DIAGNOSIS — K317 Polyp of stomach and duodenum: Secondary | ICD-10-CM | POA: Insufficient documentation

## 2023-06-15 DIAGNOSIS — K31A11 Gastric intestinal metaplasia without dysplasia, involving the antrum: Secondary | ICD-10-CM | POA: Diagnosis not present

## 2023-06-15 DIAGNOSIS — Z79899 Other long term (current) drug therapy: Secondary | ICD-10-CM | POA: Diagnosis not present

## 2023-06-15 DIAGNOSIS — M1990 Unspecified osteoarthritis, unspecified site: Secondary | ICD-10-CM | POA: Insufficient documentation

## 2023-06-15 DIAGNOSIS — K31A12 Gastric intestinal metaplasia without dysplasia, involving the body (corpus): Secondary | ICD-10-CM | POA: Insufficient documentation

## 2023-06-15 DIAGNOSIS — I10 Essential (primary) hypertension: Secondary | ICD-10-CM

## 2023-06-15 DIAGNOSIS — K2950 Unspecified chronic gastritis without bleeding: Secondary | ICD-10-CM | POA: Insufficient documentation

## 2023-06-15 DIAGNOSIS — Z86718 Personal history of other venous thrombosis and embolism: Secondary | ICD-10-CM | POA: Insufficient documentation

## 2023-06-15 DIAGNOSIS — E119 Type 2 diabetes mellitus without complications: Secondary | ICD-10-CM

## 2023-06-15 DIAGNOSIS — N183 Chronic kidney disease, stage 3 unspecified: Secondary | ICD-10-CM | POA: Diagnosis not present

## 2023-06-15 DIAGNOSIS — K2970 Gastritis, unspecified, without bleeding: Secondary | ICD-10-CM | POA: Diagnosis not present

## 2023-06-15 DIAGNOSIS — E1122 Type 2 diabetes mellitus with diabetic chronic kidney disease: Secondary | ICD-10-CM | POA: Diagnosis not present

## 2023-06-15 DIAGNOSIS — K219 Gastro-esophageal reflux disease without esophagitis: Secondary | ICD-10-CM | POA: Insufficient documentation

## 2023-06-15 DIAGNOSIS — I129 Hypertensive chronic kidney disease with stage 1 through stage 4 chronic kidney disease, or unspecified chronic kidney disease: Secondary | ICD-10-CM | POA: Insufficient documentation

## 2023-06-15 DIAGNOSIS — E039 Hypothyroidism, unspecified: Secondary | ICD-10-CM | POA: Diagnosis not present

## 2023-06-15 DIAGNOSIS — K3189 Other diseases of stomach and duodenum: Secondary | ICD-10-CM | POA: Diagnosis not present

## 2023-06-15 HISTORY — PX: POLYPECTOMY: SHX5525

## 2023-06-15 HISTORY — PX: BIOPSY: SHX5522

## 2023-06-15 HISTORY — PX: ESOPHAGOGASTRODUODENOSCOPY (EGD) WITH PROPOFOL: SHX5813

## 2023-06-15 HISTORY — PX: HEMOSTASIS CLIP PLACEMENT: SHX6857

## 2023-06-15 LAB — GLUCOSE, CAPILLARY: Glucose-Capillary: 115 mg/dL — ABNORMAL HIGH (ref 70–99)

## 2023-06-15 SURGERY — ESOPHAGOGASTRODUODENOSCOPY (EGD) WITH PROPOFOL
Anesthesia: Monitor Anesthesia Care

## 2023-06-15 MED ORDER — PROPOFOL 500 MG/50ML IV EMUL
INTRAVENOUS | Status: DC | PRN
  Administered 2023-06-15: 25 ug/kg/min via INTRAVENOUS

## 2023-06-15 MED ORDER — PHENYLEPHRINE HCL (PRESSORS) 10 MG/ML IV SOLN
INTRAVENOUS | Status: DC | PRN
Start: 2023-06-15 — End: 2023-06-15
  Administered 2023-06-15: 40 ug via INTRAVENOUS

## 2023-06-15 MED ORDER — LACTATED RINGERS IV SOLN
INTRAVENOUS | Status: AC | PRN
Start: 2023-06-15 — End: 2023-06-15
  Administered 2023-06-15: 1000 mL via INTRAVENOUS

## 2023-06-15 MED ORDER — PROPOFOL 10 MG/ML IV BOLUS
INTRAVENOUS | Status: DC | PRN
  Administered 2023-06-15: 40 mg via INTRAVENOUS
  Administered 2023-06-15: 20 mg via INTRAVENOUS

## 2023-06-15 MED ORDER — LIDOCAINE 2% (20 MG/ML) 5 ML SYRINGE
INTRAMUSCULAR | Status: DC | PRN
  Administered 2023-06-15: 60 mg via INTRAVENOUS

## 2023-06-15 MED ORDER — PROPOFOL 1000 MG/100ML IV EMUL
INTRAVENOUS | Status: AC
  Filled 2023-06-15: qty 100

## 2023-06-15 MED ORDER — ONDANSETRON HCL 4 MG/2ML IJ SOLN: INTRAMUSCULAR | Status: DC | PRN

## 2023-06-15 MED ORDER — ONDANSETRON HCL 4 MG/2ML IJ SOLN
INTRAMUSCULAR | Status: DC | PRN
  Administered 2023-06-15: 4 mg via INTRAVENOUS

## 2023-06-15 SURGICAL SUPPLY — 15 items

## 2023-06-15 NOTE — H&P (Signed)
[facility] History and Physical   Primary Care Physician:  Shelva Majestic, MD   Reason for Procedure:   Hx gastric carcinoid and polyps  Plan:    EGD     HPI: Erin Kirk is a 67 y.o. female for surveillance exam due to polyps and carcinoid tumors of the stomach as outlined below:    Multiple gastric carcinoid tumors Upper endoscopy 05/05/2019 with resection of multiple gastric carcinoid tumors EUS 07/12/2019-resection of multiple gastric carcinoid tumors, positive resection margin at the gastric cardia polypectomy site, tubular adenomas and hyperplastic polyps removed from the lesser curvature Elevated chromogranin A level CT abdomen/pelvis 05/18/2019-no evidence of a gastric mass or metastatic disease Netspot 08/11/2019-focal activity corresponding to a 2-3 mm lesion in the mid pancreas, diffuse activity in the stomach EGD 10/09/2019- 3 areas of scar/clip/polypoid tissue removed with negative pathology EUS 10/09/2019-4 mm pancreas body lesion, FNA biopsy negative, no adenopathy EUS 06/30/20-no evidence of recurrent tumor in areas of gastric body scars, nodularity was noted underneath 2 endoclips in the greater curvature, this area was biopsied and polyps were removed from the middle gastric body and proximal gastric antrum, biopsies from erosions and erythema were taken at the greater curvature, lesser curvature, and antrum.  An irregular lesion was identified in the pancreas body measuring 3.4 x 4.6 mm, the pancreas lesion was biopsied.  The pathology from the pancreas FNA was negative, the pathology from multiple biopsies including the gastric body scar, antrum polyp, greater curvature biopsy, lesser curvature biopsy, and gastric cardia biopsy revealed low-grade neuroendocrine tumor Netspot 04/14/2021-no residual avid tumor in the stomach, no evidence of tracer avid metastatic disease, decrease in tracer activity associated with the pancreas lesion Upper endoscopy  06/12/2021-5 gastric polyps resected and retrieved-hyperplastic polyp with well differentiated neuroendocrine tumor, stomach cardia and fundus biopsy-reactive gastropathy with neuroendocrine dysplasia, lesser curvature biopsy-reactive gastropathy with neuroendocrine hyperplasia Upper endoscopy 05/28/2022-multiple polyps removed from the stomach and areas of chronic gastritis were biopsied-hyperplastic polyps, reactive gastropathy, mild chronic gastritis, negative for H. pylori and carcinoma Netspot 12/03/2022-focal radiotracer activity at the gastric antrum similar to 04/14/2021, no evidence of metastatic disease      Past Medical History:  Diagnosis Date   Allergy    Anxiety    Arthritis    Asthma    many years ago   Blood transfusion without reported diagnosis    Cataract    Chronic kidney disease    Clotting disorder (HCC)    Depression    Husband died July 01, 2019   Diabetes (HCC)    not on medication   DVT (deep venous thrombosis) (HCC)    left arm   Dysrhythmia    Family history of bone cancer    Family history of breast cancer    Family history of lung cancer    Family history of ovarian cancer    Family history of testicular cancer    Family history of uterine cancer    GERD (gastroesophageal reflux disease)    Headache    Hypertension    Hypothyroidism    Iron deficiency anemia 03/30/2019   Irregular heart beat    tachycardia   Lupus anticoagulant with hypercoagulable state (HCC)    Melanoma (HCC)    2005- face    Paroxysmal atrial fibrillation (HCC)    Plantar fasciitis    Pneumonia 06-30-18   Primary hypercoagulable state (HCC)    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   Pulmonary embolism (HCC)  Thyroid disease    hypothyroidism    Past Surgical History:  Procedure Laterality Date   ABDOMINAL HYSTERECTOMY     noncancerous   BACK SURGERY  1983   BIOPSY  06/19/2020   Procedure: BIOPSY;  Surgeon: Meridee Score Netty Starring., MD;  Location: [facility] ENDOSCOPY;  Service: Gastroenterology;;   BIOPSY  06/12/2021   Procedure: BIOPSY;  Surgeon: Lemar Lofty., MD;  Location: [facility] ENDOSCOPY;  Service: Gastroenterology;;   BIOPSY  05/28/2022   Procedure: BIOPSY;  Surgeon: Iva Boop, MD;  Location: [facility] ENDOSCOPY;  Service: Gastroenterology;;   CHOLECYSTECTOMY     COLONOSCOPY     COLONOSCOPY WITH PROPOFOL N/A 06/12/2021   Procedure: COLONOSCOPY WITH PROPOFOL;  Surgeon: Lemar Lofty., MD;  Location: [facility] ENDOSCOPY;  Service: Gastroenterology;  Laterality: N/A;   ENDOSCOPIC MUCOSAL RESECTION N/A 07/12/2019   Procedure: ENDOSCOPIC MUCOSAL RESECTION;  Surgeon: Meridee Score Netty Starring., MD;  Location: [facility] ENDOSCOPY;  Service: Gastroenterology;  Laterality: N/A;   ENDOSCOPIC MUCOSAL RESECTION  06/19/2020   Procedure: ENDOSCOPIC MUCOSAL RESECTION;  Surgeon: Meridee Score Netty Starring., MD;  Location: [facility] ENDOSCOPY;  Service: Gastroenterology;;   ESOPHAGOGASTRODUODENOSCOPY N/A 06/19/2020   Procedure: ESOPHAGOGASTRODUODENOSCOPY (EGD);  Surgeon: Lemar Lofty., MD;  Location: [facility] ENDOSCOPY;  Service: Gastroenterology;  Laterality: N/A;   ESOPHAGOGASTRODUODENOSCOPY (EGD) WITH PROPOFOL N/A 07/12/2019   Procedure: ESOPHAGOGASTRODUODENOSCOPY (EGD) WITH PROPOFOL;  Surgeon: Meridee Score Netty Starring., MD;  Location: [facility] ENDOSCOPY;  Service: Gastroenterology;  Laterality: N/A;   ESOPHAGOGASTRODUODENOSCOPY (EGD) WITH PROPOFOL N/A 10/09/2019   Procedure: ESOPHAGOGASTRODUODENOSCOPY (EGD) WITH PROPOFOL;  Surgeon: Meridee Score Netty Starring., MD;  Location: [facility] ENDOSCOPY;  Service: Gastroenterology;  Laterality: N/A;   ESOPHAGOGASTRODUODENOSCOPY (EGD) WITH PROPOFOL N/A 06/12/2021   Procedure: ESOPHAGOGASTRODUODENOSCOPY (EGD) WITH PROPOFOL;  Surgeon: Meridee Score Netty Starring., MD;  Location: [facility] ENDOSCOPY;  Service: Gastroenterology;  Laterality: N/A;   ESOPHAGOGASTRODUODENOSCOPY (EGD) WITH PROPOFOL N/A 05/28/2022   Procedure:  ESOPHAGOGASTRODUODENOSCOPY (EGD) WITH PROPOFOL;  Surgeon: Iva Boop, MD;  Location: [facility] ENDOSCOPY;  Service: Gastroenterology;  Laterality: N/A;   EUS N/A 07/12/2019   Procedure: UPPER ENDOSCOPIC ULTRASOUND (EUS) RADIAL;  Surgeon: Lemar Lofty., MD;  Location: [facility] ENDOSCOPY;  Service: Gastroenterology;  Laterality: N/A;   EUS N/A 10/09/2019   Procedure: UPPER ENDOSCOPIC ULTRASOUND (EUS) RADIAL;  Surgeon: Lemar Lofty., MD;  Location: [facility] ENDOSCOPY;  Service: Gastroenterology;  Laterality: N/A;   fatty tumor breast Right    removed   FINE NEEDLE ASPIRATION  06/19/2020   Procedure: FINE NEEDLE ASPIRATION (FNA) LINEAR;  Surgeon: Lemar Lofty., MD;  Location: [facility] ENDOSCOPY;  Service: Gastroenterology;;   FOREIGN BODY REMOVAL  10/09/2019   Procedure: FOREIGN BODY REMOVAL;  Surgeon: Lemar Lofty., MD;  Location: [facility] ENDOSCOPY;  Service: Gastroenterology;;   FOREIGN BODY REMOVAL  06/19/2020   Procedure: FOREIGN BODY REMOVAL;  Surgeon: Lemar Lofty., MD;  Location: [facility] ENDOSCOPY;  Service: Gastroenterology;;  polyp removal with net    HEMOSTASIS CLIP PLACEMENT  07/12/2019   Procedure: HEMOSTASIS CLIP PLACEMENT;  Surgeon: Lemar Lofty., MD;  Location: [facility] ENDOSCOPY;  Service: Gastroenterology;;   HEMOSTASIS CLIP PLACEMENT  10/09/2019   Procedure: HEMOSTASIS CLIP PLACEMENT;  Surgeon: Lemar Lofty., MD;  Location: [facility] ENDOSCOPY;  Service: Gastroenterology;;   HEMOSTASIS CLIP PLACEMENT  06/19/2020   Procedure: HEMOSTASIS CLIP PLACEMENT;  Surgeon: Lemar Lofty., MD;  Location: [facility] ENDOSCOPY;  Service: Gastroenterology;;   HEMOSTASIS CLIP PLACEMENT  06/12/2021   Procedure: HEMOSTASIS CLIP PLACEMENT;  Surgeon: Lemar Lofty., MD;  Location: [facility] ENDOSCOPY;  Service:  Gastroenterology;;   HEMOSTASIS CLIP PLACEMENT  05/28/2022   Procedure: HEMOSTASIS CLIP PLACEMENT;  Surgeon: Iva Boop, MD;  Location: [facility] ENDOSCOPY;  Service:  Gastroenterology;;   HEMOSTASIS CONTROL  07/12/2019   Procedure: HEMOSTASIS CONTROL;  Surgeon: Lemar Lofty., MD;  Location: [facility] ENDOSCOPY;  Service: Gastroenterology;;   HYSTEROTOMY     KNEE ARTHROSCOPY Left    Lymp nodes Right 08/2004   MELANOMA EXCISION Right 2005   face    OOPHORECTOMY Bilateral    POLYPECTOMY  07/12/2019   Procedure: POLYPECTOMY;  Surgeon: Lemar Lofty., MD;  Location: [facility] ENDOSCOPY;  Service: Gastroenterology;;   POLYPECTOMY  10/09/2019   Procedure: POLYPECTOMY;  Surgeon: Lemar Lofty., MD;  Location: [facility] ENDOSCOPY;  Service: Gastroenterology;;   POLYPECTOMY  06/19/2020   Procedure: POLYPECTOMY;  Surgeon: Lemar Lofty., MD;  Location: [facility] ENDOSCOPY;  Service: Gastroenterology;;   POLYPECTOMY  06/12/2021   Procedure: POLYPECTOMY;  Surgeon: Lemar Lofty., MD;  Location: [facility] ENDOSCOPY;  Service: Gastroenterology;;   POLYPECTOMY  05/28/2022   Procedure: POLYPECTOMY;  Surgeon: Iva Boop, MD;  Location: [facility] ENDOSCOPY;  Service: Gastroenterology;;   removal lymphnodes in right neck     SUBMUCOSAL LIFTING INJECTION  07/12/2019   Procedure: SUBMUCOSAL LIFTING INJECTION;  Surgeon: Lemar Lofty., MD;  Location: [facility] ENDOSCOPY;  Service: Gastroenterology;;   SUBMUCOSAL LIFTING INJECTION  10/09/2019   Procedure: SUBMUCOSAL LIFTING INJECTION;  Surgeon: Lemar Lofty., MD;  Location: [facility] ENDOSCOPY;  Service: Gastroenterology;;   SUBMUCOSAL LIFTING INJECTION  06/12/2021   Procedure: SUBMUCOSAL LIFTING INJECTION;  Surgeon: Lemar Lofty., MD;  Location: [facility] ENDOSCOPY;  Service: Gastroenterology;;   SUBMUCOSAL LIFTING INJECTION  05/28/2022   Procedure: SUBMUCOSAL LIFTING INJECTION;  Surgeon: Iva Boop, MD;  Location: [facility] ENDOSCOPY;  Service: Gastroenterology;;   UPPER ESOPHAGEAL ENDOSCOPIC ULTRASOUND (EUS) N/A 06/19/2020   Procedure: UPPER ESOPHAGEAL ENDOSCOPIC ULTRASOUND (EUS);  Surgeon: Lemar Lofty.,  MD;  Location: [facility] ENDOSCOPY;  Service: Gastroenterology;  Laterality: N/A;   UPPER GASTROINTESTINAL ENDOSCOPY      Prior to Admission medications   Medication Sig Start Date End Date Taking? Authorizing Provider  acetaminophen (TYLENOL) 500 MG tablet Take 500 mg by mouth every 6 (six) hours as needed (for pain.).    Yes [provider]  Alum Hydroxide-Mag Trisilicate (GAVISCON) 80-14.2 MG CHEW Chew 1-2 tablets by mouth 2 (two) times daily as needed (indigestion).   Yes [provider]  Cholecalciferol (VITAMIN D3) 50 MCG (2000 UT) TABS Take 2,000 Units by mouth daily.   Yes [provider]  diltiazem (CARDIZEM CD) 360 MG 24 hr capsule Take 1 capsule by mouth at bedtime 06/03/23  Yes Shelva Majestic, MD  docusate sodium (COLACE) 100 MG capsule Take 100 mg by mouth at bedtime. Stool softner   Yes [provider]  enoxaparin (LOVENOX) 120 MG/0.8ML injection Inject 0.8 mLs (120 mg total) into the skin daily. AS DIRECTED BY ANTICOAGULATION CLINIC 05/19/23  Yes Shelva Majestic, MD  ferrous sulfate 325 (65 FE) MG tablet Take 1 tablet (325 mg total) by mouth daily with breakfast. 05/28/22  Yes Iva Boop, MD  levothyroxine (SYNTHROID) 100 MCG tablet Take 1 tablet (100 mcg total) by mouth daily. 05/28/23  Yes Shelva Majestic, MD  lovastatin (MEVACOR) 10 MG tablet Take 1 tablet (10 mg total) by mouth once a week. 05/28/22  Yes Iva Boop, MD  metoprolol succinate (TOPROL-XL) 25 MG 24 hr tablet Take 1 tablet by mouth once daily  04/19/23  Yes Shelva Majestic, MD  omeprazole (PRILOSEC) 40 MG capsule Take 1 capsule by mouth once daily 05/27/23  Yes Iva Boop, MD  telmisartan (MICARDIS) 40 MG tablet Take 1 tablet by mouth once daily 05/20/23  Yes Shelva Majestic, MD  triamterene-hydrochlorothiazide [facility]) 37.5-25 MG tablet Take 1 tablet by mouth once daily 05/31/23  Yes Shelva Majestic, MD  vitamin B-12 (CYANOCOBALAMIN) 1000 MCG tablet Take 1,000 mcg by  mouth daily.   Yes [provider]  warfarin (COUMADIN) 5 MG tablet AS DIRECTED BY  ANTICOAGULATION  CLINIC Patient taking differently: AS DIRECTED BY  ANTICOAGULATION  CLINIC Mondays and Tuesday 1/2 pill 03/01/23   Shelva Majestic, MD    Current Facility-Administered Medications  Medication Dose Route Frequency Provider Last Rate Last Admin   lactated ringers infusion    Continuous PRN Iva Boop, MD 10 mL/hr at 06/15/23 0740 1,000 mL at 06/15/23 0740    Allergies as of 04/26/2023 - Review Complete 03/19/2023  Allergen Reaction Noted   Iohexol Anaphylaxis, Hives, and Shortness Of Breath 09/24/2012   Amoxicillin-pot clavulanate Swelling and Other (See Comments) 03/09/2007   Other  05/29/2014    Family History  Problem Relation Age of Onset   Coronary artery disease Mother        Mom 72, Dad 57-smoker   Diabetes Mother    Ovarian cancer Mother 30       not officially diagnosed   Hypertension Father    Diabetes Father    Mitral valve prolapse Sister    Diabetes Sister    Breast cancer Maternal Aunt 70   Alzheimer's disease Maternal Aunt    Breast cancer Half-Sister 36   Breast cancer Half-Sister 32   Lung cancer Nephew    Bone cancer Nephew 64   Uterine cancer Niece 15   Breast cancer Niece        diagnosed mid-60s   Cancer Niece 65       unknown type   Breast cancer Other        maternal great-aunt, diagnosed in her 76s   Breast cancer Other        4th degree maternal relative, diagnosed in her 46s   Colon cancer Neg Hx    Esophageal cancer Neg Hx    Pancreatic cancer Neg Hx    Rectal cancer Neg Hx    Stomach cancer Neg Hx    Inflammatory bowel disease Neg Hx    Liver disease Neg Hx    Colon polyps Neg Hx     Social History   Socioeconomic History   Marital status: Widowed    Spouse name: Not on file   Number of children: Not on file   Years of education: Not on file   Highest education level: Not on file  Occupational History   Not on file   Tobacco Use   Smoking status: Never   Smokeless tobacco: Never  Vaping Use   Vaping status: Never Used  Substance and Sexual Activity   Alcohol use: Never    Comment: no    Drug use: Never   Sexual activity: Yes  Other Topics Concern   Not on file  Social History Narrative   Married 1971. 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    Social Determinants of Health   Financial Resource Strain: Low Risk  (02/23/2023)   Overall Financial Resource Strain (CARDIA)    Difficulty of Paying Living Expenses: Not hard at all  Food Insecurity: No Food Insecurity (02/23/2023)   Hunger Vital Sign    Worried About Running Out of Food in the Last Year: Never true    Ran Out of Food in the Last Year: Never true  Transportation Needs: No Transportation Needs (02/23/2023)   PRAPARE - Administrator, Civil Service (Medical): No    Lack of Transportation (Non-Medical): No  Physical Activity: Sufficiently Active (02/23/2023)   Exercise Vital Sign    Days of Exercise per Week: 5 days    Minutes of Exercise per Session: 60 min  Stress: No Stress Concern Present (02/23/2023)   [employer] of Occupational Health - Occupational Stress Questionnaire    Feeling of Stress : Not at all  Social Connections: Moderately Isolated (02/23/2023)   Social Connection and Isolation Panel [NHANES]    Frequency of Communication with Friends and Family: More than three times a week    Frequency of Social Gatherings with Friends and Family: More than three times a week    Attends Religious Services: 1 to 4 times per year    Active Member of [employer] or Organizations: No    Attends Banker Meetings: Never    Marital Status: Widowed  Intimate Partner Violence: Not At Risk (02/23/2023)   Humiliation, Afraid, Rape,  and Kick questionnaire    Fear of Current or Ex-Partner: No    Emotionally Abused: No    Physically Abused: No    Sexually Abused: No    Review of Systems:  All other review of systems negative except as mentioned in the HPI.  Physical Exam: Vital signs BP 115/84   Pulse 92   Temp (!) 97.5 F (36.4 C) (Temporal)   Resp 19   Ht 5\' 4"  (1.626 m)   Wt 88.5 kg   LMP  (LMP Unknown)   SpO2 99%   BMI 33.47 kg/m   General:   Alert,  Well-developed, well-nourished, pleasant and cooperative in NAD Lungs:  Clear throughout to auscultation.   Heart:  Regular rate and rhythm; no murmurs, clicks, rubs,  or gallops. Abdomen:  Soft, nontender and nondistended. Normal bowel sounds.   Neuro/Psych:  Alert and cooperative. Normal mood and affect. A and O x 3   @Orazio Weller  Sena Slate, MD, [facility] [phone] (pager) 06/15/2023 8:13 AM@

## 2023-06-15 NOTE — Discharge Instructions (Addendum)
I removed one polyp today and biopsied the stomach lining.  A few tiny polyps remain and can be watched and removed if they grow.  Please follow the anti-coagulation medication plan you have.  I appreciate the opportunity to care for you. Iva Boop, MD, FACG   YOU HAD AN ENDOSCOPIC PROCEDURE TODAY: Refer to the procedure report and other information in the discharge instructions given to you for any specific questions about what was found during the examination. If this information does not answer your questions, please call [place] office at [phone] to clarify.   YOU SHOULD EXPECT: Some feelings of bloating in the abdomen. Passage of more gas than usual. Walking can help get rid of the air that was put into your GI tract during the procedure and reduce the bloating. If you had a lower endoscopy (such as a colonoscopy or flexible sigmoidoscopy) you may notice spotting of blood in your stool or on the toilet paper. Some abdominal soreness may be present for a day or two, also.  DIET: Your first meal following the procedure should be a light meal and then it is ok to progress to your normal diet. A half-sandwich or bowl of soup is an example of a good first meal. Heavy or fried foods are harder to digest and may make you feel nauseous or bloated. Drink plenty of fluids but you should avoid alcoholic beverages for 24 hours. If you had a esophageal dilation, please see attached instructions for diet.    ACTIVITY: Your care partner should take you home directly after the procedure. You should plan to take it easy, moving slowly for the rest of the day. You can resume normal activity the day after the procedure however YOU SHOULD NOT DRIVE, use power tools, machinery or perform tasks that involve climbing or major physical exertion for 24 hours (because of the sedation medicines used during the test).   SYMPTOMS TO REPORT IMMEDIATELY: A gastroenterologist can be reached at any hour. Please  call [phone]  for any of the following symptoms:  Following lower endoscopy (colonoscopy, flexible sigmoidoscopy) Excessive amounts of blood in the stool  Significant tenderness, worsening of abdominal pains  Swelling of the abdomen that is new, acute  Fever of 100 or higher  Following upper endoscopy (EGD, EUS, ERCP, esophageal dilation) Vomiting of blood or coffee ground material  New, significant abdominal pain  New, significant chest pain or pain under the shoulder blades  Painful or persistently difficult swallowing  New shortness of breath  Black, tarry-looking or red, bloody stools  FOLLOW UP:  If any biopsies were taken you will be contacted by phone or by letter within the next 1-3 weeks. Call [phone]  if you have not heard about the biopsies in 3 weeks.  Please also call with any specific questions about appointments or follow up tests.YOU HAD AN ENDOSCOPIC PROCEDURE TODAY: Refer to the procedure report and other information in the discharge instructions given to you for any specific questions about what was found during the examination. If this information does not answer your questions, please call [place] office at [phone] to clarify.   YOU SHOULD EXPECT: Some feelings of bloating in the abdomen. Passage of more gas than usual. Walking can help get rid of the air that was put into your GI tract during the procedure and reduce the bloating. If you had a lower endoscopy (such as a colonoscopy or flexible sigmoidoscopy) you may notice spotting of blood in your stool or on  the toilet paper. Some abdominal soreness may be present for a day or two, also.  DIET: Your first meal following the procedure should be a light meal and then it is ok to progress to your normal diet. A half-sandwich or bowl of soup is an example of a good first meal. Heavy or fried foods are harder to digest and may make you feel nauseous or bloated. Drink plenty of fluids but you should avoid  alcoholic beverages for 24 hours. If you had a esophageal dilation, please see attached instructions for diet.    ACTIVITY: Your care partner should take you home directly after the procedure. You should plan to take it easy, moving slowly for the rest of the day. You can resume normal activity the day after the procedure however YOU SHOULD NOT DRIVE, use power tools, machinery or perform tasks that involve climbing or major physical exertion for 24 hours (because of the sedation medicines used during the test).   SYMPTOMS TO REPORT IMMEDIATELY: A gastroenterologist can be reached at any hour. Please call [phone]  for any of the following symptoms:  Following lower endoscopy (colonoscopy, flexible sigmoidoscopy) Excessive amounts of blood in the stool  Significant tenderness, worsening of abdominal pains  Swelling of the abdomen that is new, acute  Fever of 100 or higher  Following upper endoscopy (EGD, EUS, ERCP, esophageal dilation) Vomiting of blood or coffee ground material  New, significant abdominal pain  New, significant chest pain or pain under the shoulder blades  Painful or persistently difficult swallowing  New shortness of breath  Black, tarry-looking or red, bloody stools  FOLLOW UP:  If any biopsies were taken you will be contacted by phone or by letter within the next 1-3 weeks. Call [phone]  if you have not heard about the biopsies in 3 weeks.  Please also call with any specific questions about appointments or follow up tests.

## 2023-06-15 NOTE — Transfer of Care (Signed)
Immediate Anesthesia Transfer of Care Note  Patient: Erin Kirk  Procedure(s) Performed: ESOPHAGOGASTRODUODENOSCOPY (EGD) WITH PROPOFOL BIOPSY POLYPECTOMY HEMOSTASIS CLIP PLACEMENT  Patient Location: PACU  Anesthesia Type:MAC  Level of Consciousness: awake and patient cooperative  Airway & Oxygen Therapy: Patient Spontanous Breathing and Patient connected to face mask oxygen  Post-op Assessment: Report given to RN and Post -op Vital signs reviewed and stable  Post vital signs: Reviewed and stable  Last Vitals:  Vitals Value Taken Time  BP 105/74 06/15/23 0920  Temp    Pulse 89 06/15/23 0923  Resp 19 06/15/23 0923  SpO2 100 % 06/15/23 0923  Vitals shown include unfiled device data.  Last Pain:  Vitals:   06/15/23 0738  TempSrc: Temporal  PainSc: 2          Complications: No notable events documented.

## 2023-06-15 NOTE — Op Note (Signed)
[facility] Patient Name: Erin Kirk Procedure Date: 06/15/2023 MRN: [id number] Attending MD: Iva Boop , MD, [id number] Date of Birth: 09-22-1955 CSN: [id number] Age: 67 Admit Type: Outpatient Procedure:                Upper GI endoscopy Indications:              Gastric polyps, Follow-up of gastric polyps, For                            therapy of gastric polyps Providers:                Iva Boop, MD, Fransisca Connors, Beryle Beams, Technician, Rutha Bouchard CRNA Referring MD:              Medicines:                Monitored Anesthesia Care Complications:            No immediate complications. Estimated Blood Loss:     Estimated blood loss was minimal. Procedure:                Pre-Anesthesia Assessment:                           - Prior to the procedure, a History and Physical                            was performed, and patient medications and                            allergies were reviewed. The patient's tolerance of                            previous anesthesia was also reviewed. The risks                            and benefits of the procedure and the sedation                            options and risks were discussed with the patient.                            All questions were answered, and informed consent                            was obtained. Prior Anticoagulants: The patient                            last took Coumadin (warfarin) 5 days and Lovenox                            (enoxaparin) 1 day prior to the procedure. ASA  Grade Assessment: III - A patient with severe                            systemic disease. After reviewing the risks and                            benefits, the patient was deemed in satisfactory                            condition to undergo the procedure.                           After obtaining informed consent, the endoscope was                             passed under direct vision. Throughout the                            procedure, the patient's blood pressure, pulse, and                            oxygen saturations were monitored continuously. The                            GIF-H190 ([id number]) Olympus endoscope was introduced                            through the mouth, and advanced to the second part                            of duodenum. The upper GI endoscopy was                            accomplished without difficulty. The patient                            tolerated the procedure well. Scope In: Scope Out: Findings:      A few diminutive sessile polyps with stigmata of recent bleeding were       found in the gastric fundus and in the gastric body. One friable distal       bodypolyp was removed with a cold snare. Resection and retrieval were       complete. Verification of patient identification for the specimen was       done. Estimated blood loss was minimal. To prevent bleeding after the       polypectomy, one hemostatic clip was successfully placed. There was no       bleeding at the end of the procedure. there were multiple old clips       attached to prior polypectomy sites      Diffuse moderate inflammation characterized by congestion (edema),       erythema and friability was found in the entire examined stomach.       Biopsies were taken with a cold forceps for histology. Verification of       patient identification for the specimen was done.  Estimated blood loss       was minimal.      The exam was otherwise without abnormality.      The cardia and gastric fundus were normal on retroflexion. Impression:               - A few gastric polyps                           - Chronic gastritis. Biopsied.                           - The examination was otherwise normal.                           - 1. Multiple gastric carcinoid tumors                           Upper endoscopy 05/05/2019 with resection of                             multiple gastric carcinoid tumors                           EUS 07/12/2019?"resection of multiple gastric                            carcinoid tumors, positive resection margin at the                            gastric cardia polypectomy site, tubular adenomas                            and hyperplastic polyps removed from the lesser                            curvature                           Elevated chromogranin A level                           CT abdomen/pelvis 05/18/2019?"no evidence of a                            gastric mass or metastatic disease                           Netspot 08/11/2019?"focal activity corresponding to                            a 2-3 mm lesion in the mid pancreas, diffuse                            activity in the stomach                           EGD 10/09/2019?" 3 areas of scar/clip/polypoid tissue  removed with negative pathology                           EUS 10/09/2019?"4 mm pancreas body lesion, FNA biopsy                            negative, no adenopathy                           EUS 06/19/2020?"no evidence of recurrent tumor in                            areas of gastric body scars, nodularity was noted                            underneath 2 endoclips in the greater curvature,                            this area was biopsied and polyps were removed from                            the middle gastric body and proximal gastric                            antrum, biopsies from eerosions and erythema were                            taken at the greater curvature, lesser curvature,                            and antrum. An irregular lesion was identified in                            the pancreas body measuring 3.4 x 4.6 mm, the                            pancreas lesion was biopsied. The pathology from                            the pancreas FNA was negative, the pathology from                            multiple  biopsies including the gastric body scar,                            antrum polyp, greater curvature biopsy, lesser                            curvature biopsy, and gastric cardia biopsy                            revealed low-grade neuroendocrine tumor  Netspot 04/14/2021?"no residual avid tumor in the                            stomach, no evidence of tracer avid metastatic                            disease, decrease in tracer activity associated                            with the pancreas lesion                           Upper endoscopy 06/12/2021?"5 gastric polyps resected                            and retrieved?"hyperplastic polyp with well                            differentiated neuroendocrine tumor, stomach cardia                            and fundus biopsy?"reactive gastropathy with                            neuroendocrine dysplasia, lesser curvature                            biopsy?"reactive gastropathy with neuroendocrine                            hyper Moderate Sedation:      Not Applicable - Patient had care per Anesthesia. Recommendation:           -ONE POLYP removed today a few other diminutive                            ones left in body and fundus. None > 3 mm.                           [place] protocol used to biopsy gastritis                           Patient has a contact number available for                            emergencies. The signs and symptoms of potential                            delayed complications were discussed with the                            patient. Return to normal activities tomorrow.                            Written discharge instructions were provided to the  patient.                           - Resume previous diet.                           - Continue present medications.                           - Resume Coumadin (warfarin) tomorrow and Lovenox                             (enoxaparin) tomorrow at prior doses. Refer to                            managing physician for further adjustment of                            therapy.                           - Await pathology results. Procedure Code(s):        --- Professional ---                           [phone], Esophagogastroduodenoscopy, flexible,                            transoral; with removal of tumor(s), polyp(s), or                            other lesion(s) by snare technique                           43239, 59, Esophagogastroduodenoscopy, flexible,                            transoral; with biopsy, single or multiple Diagnosis Code(s):        --- Professional ---                           K31.7, Polyp of stomach and duodenum                           K29.50, Unspecified chronic gastritis without                            bleeding CPT copyright 2022 [employer]. All rights reserved. The codes documented in this report are preliminary and upon coder review may  be revised to meet current compliance requirements. Iva Boop, MD 06/15/2023 9:32:43 AM This report has been signed electronically. Number of Addenda: 0

## 2023-06-15 NOTE — Anesthesia Postprocedure Evaluation (Signed)
Anesthesia Post Note  Patient: Erin Kirk  Procedure(s) Performed: ESOPHAGOGASTRODUODENOSCOPY (EGD) WITH PROPOFOL BIOPSY POLYPECTOMY HEMOSTASIS CLIP PLACEMENT     Patient location during evaluation: Endoscopy Anesthesia Type: MAC Level of consciousness: awake and alert Pain management: pain level controlled Vital Signs Assessment: post-procedure vital signs reviewed and stable Respiratory status: spontaneous breathing, nonlabored ventilation, respiratory function stable and patient connected to nasal cannula oxygen Cardiovascular status: blood pressure returned to baseline and stable Postop Assessment: no apparent nausea or vomiting Anesthetic complications: no   No notable events documented.  Last Vitals:  Vitals:   06/15/23 0930 06/15/23 0940  BP: 107/64 123/83  Pulse: 76 86  Resp: 18 13  Temp:    SpO2: 98% 96%    Last Pain:  Vitals:   06/15/23 0940  TempSrc:   PainSc: 0-No pain                 Trevor Iha

## 2023-06-16 LAB — SURGICAL PATHOLOGY

## 2023-06-17 ENCOUNTER — Encounter (HOSPITAL_COMMUNITY): Payer: Self-pay | Admitting: Internal Medicine

## 2023-06-23 ENCOUNTER — Ambulatory Visit (INDEPENDENT_AMBULATORY_CARE_PROVIDER_SITE_OTHER): Payer: Medicare Other

## 2023-06-23 DIAGNOSIS — Z7901 Long term (current) use of anticoagulants: Secondary | ICD-10-CM | POA: Diagnosis not present

## 2023-06-23 LAB — POCT INR: INR: 3 (ref 2.0–3.0)

## 2023-06-23 MED ORDER — WARFARIN SODIUM 5 MG PO TABS: ORAL_TABLET | ORAL | 3 refills | Status: DC

## 2023-06-23 NOTE — Patient Instructions (Addendum)
Pre visit review using our clinic review tool, if applicable. No additional management support is needed unless otherwise documented below in the visit note.  Continue 1 tablet daily except take 1/2 tablet on Mondays and Thursdays. Recheck in 6 weeks.  

## 2023-06-23 NOTE — Progress Notes (Cosign Needed Addendum)
Continue 1 tablet daily except take 1/2 tablet on Mondays and Thursdays. Recheck in 6 weeks.   Pt is compliant with warfarin management and PCP apts.  Sent in refill of warfarin to requested pharmacy.

## 2023-06-24 NOTE — Progress Notes (Signed)
I have reviewed the patient's encounter and agree with the documentation.  Katina Degree. Jimmey Ralph, MD 06/24/2023 7:26 AM

## 2023-07-14 ENCOUNTER — Other Ambulatory Visit: Payer: Self-pay | Admitting: Family Medicine

## 2023-07-14 ENCOUNTER — Inpatient Hospital Stay: Payer: Medicare Other | Attending: Oncology

## 2023-07-14 DIAGNOSIS — Z85020 Personal history of malignant carcinoid tumor of stomach: Secondary | ICD-10-CM | POA: Insufficient documentation

## 2023-07-14 DIAGNOSIS — C7A092 Malignant carcinoid tumor of the stomach: Secondary | ICD-10-CM

## 2023-07-15 LAB — CHROMOGRANIN A: Chromogranin A (ng/mL): 828.7 ng/mL — ABNORMAL HIGH (ref 0.0–101.8)

## 2023-07-21 ENCOUNTER — Inpatient Hospital Stay: Payer: Medicare Other | Admitting: Oncology

## 2023-07-21 VITALS — BP 108/84 | HR 75 | Temp 98.1°F | Resp 18 | Ht 64.0 in | Wt 195.5 lb

## 2023-07-21 DIAGNOSIS — C7A092 Malignant carcinoid tumor of the stomach: Secondary | ICD-10-CM

## 2023-07-21 DIAGNOSIS — Z85020 Personal history of malignant carcinoid tumor of stomach: Secondary | ICD-10-CM | POA: Diagnosis not present

## 2023-07-21 NOTE — Progress Notes (Signed)
[facility] OFFICE PROGRESS NOTE   Diagnosis: Carcinoid tumor  INTERVAL HISTORY:   Erin Kirk returns as scheduled.  She generally feels well.  She has occasional nausea in the mornings.  She has diarrhea 2 or 3 days/week.  No bleeding.  No consistent nausea or abdominal pain.  Her appetite is variable. She underwent an upper endoscopy 06/15/2023.  Sessile polyps with stigmata of recent bleeding were found in the gastric fundus and gastric body.  Follow-up was resected.  Diffuse moderate inflammation in the stomach.  Biopsies were obtained.  She held omeprazole for 1 week prior to the chromogranin a level 07/14/2023.  Objective:  Vital signs in last 24 hours:  Blood pressure 108/84, pulse 75, temperature 98.1 F (36.7 C), temperature source Temporal, resp. rate 18, height 5\' 4"  (1.626 m), weight 195 lb 8 oz (88.7 kg), SpO2 97%.   Lymphatics: No cervical, supraclavicular, axillary, or inguinal nodes Resp: Lungs clear bilaterally Cardio: Regular rate and rhythm GI: No mass, nontender, no hepatosplenomegaly, mild tenderness in the left subcostal region Vascular: No leg edema  Lab Results:  Lab Results  Component Value Date   WBC 7.1 03/19/2023   HGB 14.4 03/19/2023   HCT 42.8 03/19/2023   MCV 89.8 03/19/2023   PLT 208.0 03/19/2023   NEUTROABS 4.5 03/19/2023    CMP  Lab Results  Component Value Date   NA 140 03/19/2023   K 4.0 03/19/2023   CL 101 03/19/2023   CO2 30 03/19/2023   GLUCOSE 110 (H) 03/19/2023   BUN 20 03/19/2023   CREATININE 1.22 (H) 03/19/2023   CALCIUM 10.1 03/19/2023   PROT 7.2 03/19/2023   ALBUMIN 4.7 03/19/2023   AST 14 03/19/2023   ALT 17 03/19/2023   ALKPHOS 61 03/19/2023   BILITOT 0.9 03/19/2023   GFRNONAA 49 (L) 08/19/2020   GFRAA 57 (L) 08/19/2020    Chromogranin A on 07/14/2023: 828.7  Medications: I have reviewed the patient's current medications.   Assessment/Plan: Multiple gastric carcinoid tumors Upper endoscopy  05/05/2019 with resection of multiple gastric carcinoid tumors EUS 07/12/2019-resection of multiple gastric carcinoid tumors, positive resection margin at the gastric cardia polypectomy site, tubular adenomas and hyperplastic polyps removed from the lesser curvature Elevated chromogranin A level CT abdomen/pelvis 05/18/2019-no evidence of a gastric mass or metastatic disease Netspot 08/11/2019-focal activity corresponding to a 2-3 mm lesion in the mid pancreas, diffuse activity in the stomach EGD 10/09/2019- 3 areas of scar/clip/polypoid tissue removed with negative pathology EUS 10/09/2019-4 mm pancreas body lesion, FNA biopsy negative, no adenopathy EUS 06/19/2020-no evidence of recurrent tumor in areas of gastric body scars, nodularity was noted underneath 2 endoclips in the greater curvature, this area was biopsied and polyps were removed from the middle gastric body and proximal gastric antrum, biopsies from erosions and erythema were taken at the greater curvature, lesser curvature, and antrum.  An irregular lesion was identified in the pancreas body measuring 3.4 x 4.6 mm, the pancreas lesion was biopsied.  The pathology from the pancreas FNA was negative, the pathology from multiple biopsies including the gastric body scar, antrum polyp, greater curvature biopsy, lesser curvature biopsy, and gastric cardia biopsy revealed low-grade neuroendocrine tumor Netspot 04/14/2021-no residual avid tumor in the stomach, no evidence of tracer avid metastatic disease, decrease in tracer activity associated with the pancreas lesion Upper endoscopy 06/12/2021-5 gastric polyps resected and retrieved-hyperplastic polyp with well differentiated neuroendocrine tumor, stomach cardia and fundus biopsy-reactive gastropathy with neuroendocrine dysplasia, lesser curvature biopsy-reactive gastropathy with neuroendocrine hyperplasia  Upper endoscopy 05/28/2022-multiple polyps removed from the stomach and areas of chronic gastritis  were biopsied-hyperplastic polyps, reactive gastropathy, mild chronic gastritis, negative for H. 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   Hypothyroidism 7.  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  Family history of uterine and breast cancer 9.  Gastric and colonic adenomas Colonoscopy 06/12/2021-polyps removed from the cecum, ascending colon, and descending colon-tubular adenomas and hyperplastic polyp 10.  Hypodense pancreas lesion 05/18/2019 -positive Netspot uptake 08/11/2019, negative FNA biopsy    Disposition: Erin Kirk appears stable.  There is no clinical evidence for progression of the gastric carcinoid tumor.  A recent endoscopy revealed no evidence of carcinoid tumor.  The chromogranin a level is slightly lower.  The plan is to continue observation.  She will return for an office visit and chromogranin A in 6 months.  She will continue endoscopic surveillance with Dr. Leone Payor.  Thornton Papas, MD  07/21/2023  12:11 PM

## 2023-08-04 ENCOUNTER — Ambulatory Visit (INDEPENDENT_AMBULATORY_CARE_PROVIDER_SITE_OTHER): Payer: Medicare Other

## 2023-08-04 DIAGNOSIS — Z7901 Long term (current) use of anticoagulants: Secondary | ICD-10-CM | POA: Diagnosis not present

## 2023-08-04 LAB — POCT INR: INR: 4.1 — AB (ref 2.0–3.0)

## 2023-08-04 NOTE — Progress Notes (Signed)
Pt reports she is not eating well because she has not been hungry. There is a reduction in vitamin K containing foods also. Hold dose today and then change weekly dose to take 1 tablet daily except take 1/2 tablet on Mondays, Wednesday and Friday. Recheck in 3 weeks.

## 2023-08-04 NOTE — Patient Instructions (Addendum)
Pre visit review using our clinic review tool, if applicable. No additional management support is needed unless otherwise documented below in the visit note.  Hold dose today and then change weekly dose to take 1 tablet daily except take 1/2 tablet on Mondays, Wednesday and Friday. Recheck in 3 weeks.

## 2023-08-04 NOTE — Progress Notes (Signed)
I have reviewed and agree with note, evaluation, plan.   Jeffory Snelgrove, MD  

## 2023-08-11 ENCOUNTER — Other Ambulatory Visit: Payer: Self-pay | Admitting: Family Medicine

## 2023-08-25 ENCOUNTER — Ambulatory Visit (INDEPENDENT_AMBULATORY_CARE_PROVIDER_SITE_OTHER): Payer: Medicare Other

## 2023-08-25 DIAGNOSIS — Z7901 Long term (current) use of anticoagulants: Secondary | ICD-10-CM | POA: Diagnosis not present

## 2023-08-25 LAB — POCT INR: INR: 3 (ref 2.0–3.0)

## 2023-08-25 NOTE — Progress Notes (Signed)
I have reviewed and agree with note, evaluation, plan.   Jeffory Snelgrove, MD  

## 2023-08-25 NOTE — Patient Instructions (Addendum)
Pre visit review using our clinic review tool, if applicable. No additional management support is needed unless otherwise documented below in the visit note.  Continue 1 tablet daily except take 1/2 tablet on Mondays, Wednesday and Friday. Recheck in 5 weeks.

## 2023-08-25 NOTE — Progress Notes (Signed)
Continue 1 tablet daily except take 1/2 tablet on Mondays, Wednesday and Friday. Recheck in 5 weeks.

## 2023-08-30 ENCOUNTER — Other Ambulatory Visit: Payer: Self-pay | Admitting: Family Medicine

## 2023-08-30 ENCOUNTER — Other Ambulatory Visit: Payer: Self-pay | Admitting: Internal Medicine

## 2023-09-10 ENCOUNTER — Ambulatory Visit: Payer: Medicare Other | Admitting: Family Medicine

## 2023-09-13 ENCOUNTER — Ambulatory Visit: Payer: Medicare Other | Admitting: Family Medicine

## 2023-09-17 DIAGNOSIS — M71342 Other bursal cyst, left hand: Secondary | ICD-10-CM | POA: Diagnosis not present

## 2023-09-17 DIAGNOSIS — M71371 Other bursal cyst, right ankle and foot: Secondary | ICD-10-CM | POA: Diagnosis not present

## 2023-09-17 DIAGNOSIS — L814 Other melanin hyperpigmentation: Secondary | ICD-10-CM | POA: Diagnosis not present

## 2023-09-17 DIAGNOSIS — Z85820 Personal history of malignant melanoma of skin: Secondary | ICD-10-CM | POA: Diagnosis not present

## 2023-09-17 DIAGNOSIS — L821 Other seborrheic keratosis: Secondary | ICD-10-CM | POA: Diagnosis not present

## 2023-09-17 DIAGNOSIS — D225 Melanocytic nevi of trunk: Secondary | ICD-10-CM | POA: Diagnosis not present

## 2023-09-17 DIAGNOSIS — Z85828 Personal history of other malignant neoplasm of skin: Secondary | ICD-10-CM | POA: Diagnosis not present

## 2023-09-29 ENCOUNTER — Ambulatory Visit: Payer: Medicare Other

## 2023-09-29 DIAGNOSIS — Z7901 Long term (current) use of anticoagulants: Secondary | ICD-10-CM

## 2023-09-29 LAB — POCT INR: INR: 2.8 (ref 2.0–3.0)

## 2023-09-29 NOTE — Patient Instructions (Addendum)
 Pre visit review using our clinic review tool, if applicable. No additional management support is needed unless otherwise documented below in the visit note.  Continue 1 tablet daily except take 1/2 tablet on Mondays, Wednesday and Friday. Recheck in 6 weeks.

## 2023-09-29 NOTE — Progress Notes (Signed)
 Continue 1 tablet daily except take 1/2 tablet on Mondays, Wednesday and Friday. Recheck in 6 weeks.

## 2023-09-29 NOTE — Progress Notes (Signed)
 I have reviewed and agree with note, evaluation, plan.   Tana Conch, MD

## 2023-09-30 ENCOUNTER — Encounter: Payer: Self-pay | Admitting: Family Medicine

## 2023-09-30 ENCOUNTER — Ambulatory Visit (INDEPENDENT_AMBULATORY_CARE_PROVIDER_SITE_OTHER): Payer: Medicare Other | Admitting: Family Medicine

## 2023-09-30 VITALS — BP 100/80 | HR 86 | Temp 97.3°F | Ht 64.0 in | Wt 195.6 lb

## 2023-09-30 DIAGNOSIS — I480 Paroxysmal atrial fibrillation: Secondary | ICD-10-CM | POA: Diagnosis not present

## 2023-09-30 DIAGNOSIS — E1169 Type 2 diabetes mellitus with other specified complication: Secondary | ICD-10-CM | POA: Diagnosis not present

## 2023-09-30 DIAGNOSIS — E785 Hyperlipidemia, unspecified: Secondary | ICD-10-CM

## 2023-09-30 DIAGNOSIS — E039 Hypothyroidism, unspecified: Secondary | ICD-10-CM | POA: Diagnosis not present

## 2023-09-30 DIAGNOSIS — C7A092 Malignant carcinoid tumor of the stomach: Secondary | ICD-10-CM

## 2023-09-30 DIAGNOSIS — E538 Deficiency of other specified B group vitamins: Secondary | ICD-10-CM | POA: Diagnosis not present

## 2023-09-30 DIAGNOSIS — N183 Chronic kidney disease, stage 3 unspecified: Secondary | ICD-10-CM | POA: Diagnosis not present

## 2023-09-30 DIAGNOSIS — E669 Obesity, unspecified: Secondary | ICD-10-CM | POA: Diagnosis not present

## 2023-09-30 DIAGNOSIS — Z6833 Body mass index (BMI) 33.0-33.9, adult: Secondary | ICD-10-CM

## 2023-09-30 DIAGNOSIS — D6869 Other thrombophilia: Secondary | ICD-10-CM

## 2023-09-30 DIAGNOSIS — E119 Type 2 diabetes mellitus without complications: Secondary | ICD-10-CM

## 2023-09-30 LAB — CBC WITH DIFFERENTIAL/PLATELET
Basophils Absolute: 0.1 10*3/uL (ref 0.0–0.1)
Basophils Relative: 1 % (ref 0.0–3.0)
Eosinophils Absolute: 0.4 10*3/uL (ref 0.0–0.7)
Eosinophils Relative: 5.6 % — ABNORMAL HIGH (ref 0.0–5.0)
HCT: 42.1 % (ref 36.0–46.0)
Hemoglobin: 14.1 g/dL (ref 12.0–15.0)
Lymphocytes Relative: 29.1 % (ref 12.0–46.0)
Lymphs Abs: 1.9 10*3/uL (ref 0.7–4.0)
MCHC: 33.5 g/dL (ref 30.0–36.0)
MCV: 91 fL (ref 78.0–100.0)
Monocytes Absolute: 0.5 10*3/uL (ref 0.1–1.0)
Monocytes Relative: 8.3 % (ref 3.0–12.0)
Neutro Abs: 3.7 10*3/uL (ref 1.4–7.7)
Neutrophils Relative %: 56 % (ref 43.0–77.0)
Platelets: 211 10*3/uL (ref 150.0–400.0)
RBC: 4.62 Mil/uL (ref 3.87–5.11)
RDW: 13.3 % (ref 11.5–15.5)
WBC: 6.6 10*3/uL (ref 4.0–10.5)

## 2023-09-30 LAB — COMPREHENSIVE METABOLIC PANEL
ALT: 16 U/L (ref 0–35)
AST: 16 U/L (ref 0–37)
Albumin: 4.6 g/dL (ref 3.5–5.2)
Alkaline Phosphatase: 62 U/L (ref 39–117)
BUN: 22 mg/dL (ref 6–23)
CO2: 27 meq/L (ref 19–32)
Calcium: 9.5 mg/dL (ref 8.4–10.5)
Chloride: 100 meq/L (ref 96–112)
Creatinine, Ser: 1.33 mg/dL — ABNORMAL HIGH (ref 0.40–1.20)
GFR: 40.79 mL/min — ABNORMAL LOW (ref >60.00)
Glucose, Bld: 100 mg/dL — ABNORMAL HIGH (ref 70–99)
Potassium: 3.6 meq/L (ref 3.5–5.1)
Sodium: 138 meq/L (ref 135–145)
Total Bilirubin: 1 mg/dL (ref 0.2–1.2)
Total Protein: 7.5 g/dL (ref 6.0–8.3)

## 2023-09-30 LAB — TSH: TSH: 1.66 u[IU]/mL (ref 0.35–5.50)

## 2023-09-30 LAB — HEMOGLOBIN A1C: Hgb A1c MFr Bld: 6.5 % (ref 4.6–6.5)

## 2023-09-30 LAB — VITAMIN B12: Vitamin B-12: 1442 pg/mL — ABNORMAL HIGH (ref 211–911)

## 2023-09-30 NOTE — Progress Notes (Signed)
 Phone [phone] In person visit   Subjective:   Erin Kirk is a 68 y.o. 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) 05/12/2019    Priority: High   Low vitamin B12 level 09/07/2016    Priority: High   Disability examination 01/17/2015    Priority: High   Type 2 diabetes mellitus with obesity (HCC) 11/28/2010    Priority: High   PAROXYSMAL ATRIAL FIBRILLATION 07/02/2010    Priority: High   Primary hypercoagulable state (HCC) 11/30/2007    Priority: High   Osteopenia 02/26/2020    Priority: Medium    Vitamin D  deficiency 07/20/2019    Priority: Medium    Iron  deficiency anemia 03/30/2019    Priority: Medium    Hyperlipidemia associated with type 2 diabetes mellitus (HCC) 03/09/2019    Priority: Medium    Cough variant asthma 10/14/2018    Priority: Medium    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) 11/09/2017    Priority: Medium    Hx of adenomatous polyp of colon 04/10/2016    Priority: Medium    CKD (chronic kidney disease), stage III (HCC) 09/05/2014    Priority: Medium    Hypothyroidism 03/09/2007    Priority: Medium    Essential hypertension 03/09/2007    Priority: Medium    History of Melanoma 03/09/2007    Priority: Medium    Abnormal findings on esophagogastroduodenoscopy (EGD) 07/02/2019    Priority: Low   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    Priority: Low   Multiple gastric polyps 07/02/2019    Priority: Low   Long term (current) use of anticoagulants - warfarin 06/02/2017    Priority: Low    Encounter for therapeutic drug monitoring 10/26/2013    Priority: Low   Left facial pain 09/24/2012    Priority: Low   Oral mucosal lesion 09/24/2012    Priority: Low   TRANSIENT DISORDER INITIATING/MAINTAINING SLEEP 04/02/2010    Priority: Low   Overweight(278.02) 11/29/2009    Priority: Low   LOW BACK PAIN 06/15/2008    Priority: Low   ALLERGIC RHINITIS 03/09/2007    Priority: Low   GERD 03/09/2007    Priority: Low   Gastric polyp 06/15/2023   Chronic gastritis without bleeding 06/15/2023   Pancreatic lesion on EUS and NETSPOT  scan ? neuroendocrine tumor 04/21/2020   Abnormal finding on radiology exam 04/21/2020   Intestinal metaplasia of gastric mucosa 04/21/2020   Acquired thrombophilia (HCC) 02/14/2020   Myalgia due to statin 02/14/2020   Genetic testing 08/15/2019   Family history of breast cancer    Family history of ovarian cancer    Family history of uterine cancer    Family history of lung cancer    Family history of bone cancer    Family history of testicular cancer     Medications- reviewed and updated Current Outpatient Medications  Medication Sig Dispense Refill   acetaminophen  (TYLENOL ) 500 MG tablet Take 500 mg by mouth every 6 (six) hours as needed (for pain.).  Alum Hydroxide-Mag Trisilicate (GAVISCON) 80-14.2 MG CHEW Chew 1-2 tablets by mouth 2 (two) times daily as needed (indigestion).     Cholecalciferol (VITAMIN D3) 50 MCG (2000 UT) TABS Take 2,000 Units by mouth daily.     diltiazem  (CARDIZEM  CD) 360 MG 24 hr capsule Take 1 capsule by mouth at bedtime 90 capsule 0   docusate sodium (COLACE) 100 MG capsule Take 100 mg by mouth at bedtime. Stool softner     ferrous sulfate  325 (65 FE) MG tablet Take 1 tablet (325 mg total) by mouth daily with breakfast.     levothyroxine  (SYNTHROID ) 100 MCG tablet Take 1 tablet (100 mcg total) by mouth daily. 90 tablet 2   lovastatin  (MEVACOR ) 10 MG tablet Take 1 tablet (10 mg total) by mouth once a week.      metoprolol  succinate (TOPROL -XL) 25 MG 24 hr tablet Take 1 tablet by mouth once daily 90 tablet 0   omeprazole  (PRILOSEC) 40 MG capsule Take 1 capsule by mouth once daily 90 capsule 2   telmisartan  (MICARDIS ) 40 MG tablet Take 1 tablet by mouth once daily 90 tablet 0   triamterene -hydrochlorothiazide  (MAXZIDE -25) 37.5-25 MG tablet Take 1 tablet by mouth once daily 90 tablet 3   vitamin B-12 (CYANOCOBALAMIN ) 1000 MCG tablet Take 1,000 mcg by mouth daily.     warfarin (COUMADIN ) 5 MG tablet TAKE 1 TABLET BY MOUTH DAILY EXCEPT TAKE 1/2 TABLET ON MONDAYS AND THURSDAYS OR AS DIRECTED BY ANTICOAGULATION CLINIC 105 tablet 3   Current Facility-Administered Medications  Medication Dose Route Frequency Provider Last Rate Last Admin   phenazopyridine  (PYRIDIUM ) tablet 100 mg  100 mg Oral TID WC Jesus Bernardino MATSU, MD         Objective:  BP 100/80   Pulse 86   Temp (!) 97.3 F (36.3 C)   Ht 5' 4 (1.626 m)   Wt 195 lb 9.6 oz (88.7 kg)   LMP  (LMP Unknown)   SpO2 97%   BMI 33.57 kg/m  Gen: NAD, resting comfortably CV: RRR no murmurs rubs or gallops Lungs: CTAB no crackles, wheeze, rhonchi Ext: no edema Skin: warm, dry     Assessment and Plan   #social update- sister with spot on lungs that is being evaluated- lighting up on PET scan  # 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 Wilhelmenia  or Dr Avram- recent EGD September 2024. follow chromogranin levels and PET scans. EGD yearly now- will be due September 2025 as long as chromogranin levels remain low- checks q6 months  # primary hypercoagulable state with history of multiple DVT and PE #acquired thrombophilia on coumadin  S: patient remains on  Coumadin  long term -requires lovenox  bridge for procedures.  A/P: stable- continue current medicines    # Atrial fibrillation S: Rate controlled with  diltiazem  360mg  ER and metoprolol  25 mg ER Anticoagulated with  warfarin.  Also requires lovenox  bridge for procedures A/P: appropriately anticoagulated and rate controlled- continue current medicine    # Iron  deficiency anemia- iron  levels have been low- currently taking 3x a week. Also on high dose PPI through GI. Will check next visit ferritin but CBC today Lab Results  Component Value Date   FERRITIN 44.6 03/19/2023   #hypertension S: medication:  diltiazem  360mg  XR, metoprolol  25 mg XR, telmisartan  40mg , triamterene  hctz 37.5-25 mg BP Readings from Last 3 Encounters:  09/30/23 100/80  07/21/23 108/84  06/15/23 123/83  A/P: running lower today  but in September in 120s and diastolic at 80's- continue current medications and monitor unless trends lower or she feels poorly (no dizziness or lightheadedness) or if renal function worse  #hyperlipidemia- she prefers to stay on lowest dose possible S: Medication:r lovastatin  10 mg once a week Lab Results  Component Value Date   CHOL 179 03/19/2023   HDL 52.30 03/19/2023   LDLCALC 95 03/19/2023   LDLDIRECT 39.0 03/10/2019   TRIG 159.0 (H) 03/19/2023   CHOLHDL 3 03/19/2023   A/P: reasonable control with LDL under 100 last visit- she prefers lowest dose possible so we will continue current medications   # Diabetes S: Medication: diet controlled Exercise and diet- a little worried may be up with holidays Lab Results  Component Value Date   HGBA1C 6.3 03/19/2023   HGBA1C 6.1 09/09/2022   HGBA1C 6.2 03/17/2022  A/P: hopefully stable- update a1c today. Continue current meds for now   #hypothyroidism S: compliant On thyroid  medication- levothyroxine  100 mcg Lab Results  Component Value Date   TSH 2.28 03/19/2023  A/P:hopefully stable- update tsh today. Continue current meds for now    # B12 deficiency S: history of neuropathy with this. Metformin  likely contributed. Doing well now on oral medication after time on injectoins.  Still on oral Lab Results  Component Value Date   VITAMINB12 >1500 (H)  03/19/2023  A/P: hopefully stable- update b12 today. Continue current meds for now    Recommended follow up: Return in about 6 months (around 03/29/2024) for physical or sooner if needed.Schedule b4 you leave. Future Appointments  Date Time Provider Department Center  11/10/2023 10:30 AM [facility] COUMADIN  CLINIC [facility]  01/13/2024 10:45 AM [facility] PHLEBOTOMIST [facility] None  01/20/2024 11:40 AM Cloretta Arley NOVAK, MD [facility] None  02/29/2024  9:45 AM [facility] ANNUAL WELLNESS VISIT 1 [facility]   Lab/Order associations:   ICD-10-CM   1. Type 2 diabetes mellitus with obesity (HCC)  E11.69 Hemoglobin A1c   E66.9 Comprehensive metabolic panel    CBC with Differential/Platelet    2. Malignant carcinoid tumor of stomach (HCC) Chronic C7A.092     3. Paroxysmal atrial fibrillation (HCC) Chronic I48.0     4. Hyperlipidemia associated with type 2 diabetes mellitus (HCC) Chronic E11.69 Comprehensive metabolic panel   Z21.4 CBC with Differential/Platelet    5. Stage 3 chronic kidney disease, unspecified whether stage 3a or 3b CKD (HCC) Chronic N18.30     6. Acquired thrombophilia (HCC)  D68.69     7. Hypothyroidism, unspecified type  E03.9 TSH    8. B12 deficiency  E53.8 Vitamin B12      No orders of the defined types were placed in this encounter.   Return precautions advised.  Garnette Lukes, MD

## 2023-09-30 NOTE — Patient Instructions (Addendum)
 Get eye exam scheduled  Please stop by lab before you go If you have mychart- we will send your results within 3 business days of us  receiving them.  If you do not have mychart- we will call you about results within 5 business days of us  receiving them.  *please also note that you will see labs on mychart as soon as they post. I will later go in and write notes on them- will say notes from Dr. Katrinka    Recommended follow up: Return in about 6 months (around 03/29/2024) for physical or sooner if needed.Schedule b4 you leave.

## 2023-10-08 ENCOUNTER — Other Ambulatory Visit: Payer: Self-pay | Admitting: Family Medicine

## 2023-10-19 DIAGNOSIS — Z1231 Encounter for screening mammogram for malignant neoplasm of breast: Secondary | ICD-10-CM | POA: Diagnosis not present

## 2023-10-19 LAB — HM MAMMOGRAPHY

## 2023-11-08 ENCOUNTER — Ambulatory Visit (INDEPENDENT_AMBULATORY_CARE_PROVIDER_SITE_OTHER): Payer: Medicare Other

## 2023-11-08 DIAGNOSIS — Z7901 Long term (current) use of anticoagulants: Secondary | ICD-10-CM

## 2023-11-08 LAB — POCT INR: INR: 3.5 — AB (ref 2.0–3.0)

## 2023-11-08 NOTE — Progress Notes (Signed)
Continue 1 tablet daily except take 1/2 tablet on Mondays, Wednesday and Friday. Recheck in 6 weeks.

## 2023-11-08 NOTE — Patient Instructions (Addendum)
Pre visit review using our clinic review tool, if applicable. No additional management support is needed unless otherwise documented below in the visit note.  Continue 1 tablet daily except take 1/2 tablet on Mondays, Wednesday and Friday. Recheck in 6 weeks.

## 2023-11-10 ENCOUNTER — Ambulatory Visit: Payer: Medicare Other

## 2023-11-12 ENCOUNTER — Other Ambulatory Visit: Payer: Self-pay | Admitting: Family Medicine

## 2023-11-30 ENCOUNTER — Other Ambulatory Visit: Payer: Self-pay | Admitting: Family Medicine

## 2023-12-22 ENCOUNTER — Ambulatory Visit (INDEPENDENT_AMBULATORY_CARE_PROVIDER_SITE_OTHER): Payer: Medicare Other

## 2023-12-22 DIAGNOSIS — Z7901 Long term (current) use of anticoagulants: Secondary | ICD-10-CM | POA: Diagnosis not present

## 2023-12-22 LAB — POCT INR: INR: 3.2 — AB (ref 2.0–3.0)

## 2023-12-22 NOTE — Patient Instructions (Addendum)
 Pre visit review using our clinic review tool, if applicable. No additional management support is needed unless otherwise documented below in the visit note.  Continue 1 tablet daily except take 1/2 tablet on Mondays, Wednesday and Friday. Recheck in 6 weeks.

## 2023-12-22 NOTE — Progress Notes (Signed)
 Continue 1 tablet daily except take 1/2 tablet on Mondays, Wednesday and Friday. Recheck in 6 weeks.

## 2024-01-10 ENCOUNTER — Other Ambulatory Visit: Payer: Self-pay | Admitting: Family Medicine

## 2024-01-13 ENCOUNTER — Other Ambulatory Visit: Payer: Medicare Other

## 2024-01-13 ENCOUNTER — Inpatient Hospital Stay: Payer: Self-pay | Attending: Oncology

## 2024-01-13 DIAGNOSIS — C7A092 Malignant carcinoid tumor of the stomach: Secondary | ICD-10-CM | POA: Diagnosis not present

## 2024-01-14 LAB — CHROMOGRANIN A: Chromogranin A (ng/mL): 734.1 ng/mL — ABNORMAL HIGH (ref 0.0–101.8)

## 2024-01-20 ENCOUNTER — Other Ambulatory Visit: Payer: Medicare Other

## 2024-01-20 ENCOUNTER — Inpatient Hospital Stay: Payer: Medicare Other | Attending: Oncology | Admitting: Oncology

## 2024-01-20 VITALS — BP 110/79 | HR 96 | Temp 98.1°F | Resp 18 | Ht 64.0 in | Wt 198.8 lb

## 2024-01-20 DIAGNOSIS — Z7901 Long term (current) use of anticoagulants: Secondary | ICD-10-CM | POA: Insufficient documentation

## 2024-01-20 DIAGNOSIS — Z85820 Personal history of malignant melanoma of skin: Secondary | ICD-10-CM | POA: Diagnosis not present

## 2024-01-20 DIAGNOSIS — R109 Unspecified abdominal pain: Secondary | ICD-10-CM | POA: Insufficient documentation

## 2024-01-20 DIAGNOSIS — Z8049 Family history of malignant neoplasm of other genital organs: Secondary | ICD-10-CM | POA: Insufficient documentation

## 2024-01-20 DIAGNOSIS — E039 Hypothyroidism, unspecified: Secondary | ICD-10-CM | POA: Diagnosis not present

## 2024-01-20 DIAGNOSIS — I4891 Unspecified atrial fibrillation: Secondary | ICD-10-CM | POA: Diagnosis not present

## 2024-01-20 DIAGNOSIS — C7A092 Malignant carcinoid tumor of the stomach: Secondary | ICD-10-CM | POA: Insufficient documentation

## 2024-01-20 DIAGNOSIS — Z803 Family history of malignant neoplasm of breast: Secondary | ICD-10-CM | POA: Diagnosis not present

## 2024-01-20 DIAGNOSIS — E119 Type 2 diabetes mellitus without complications: Secondary | ICD-10-CM | POA: Insufficient documentation

## 2024-01-20 NOTE — Progress Notes (Signed)
 [name] OFFICE PROGRESS NOTE   Diagnosis: Gastric carcinoid tumors  INTERVAL HISTORY:   [name] returns as scheduled.  She generally feels well.  Good appetite.  She has chronic intermittent diarrhea.  No flushing.  She reports abdominal "bloating "after meals.  No nausea or vomiting.  Objective:  Vital signs in last 24 hours:  Blood pressure 110/79, pulse 64, temperature 98.1 F (36.7 C), temperature source Temporal, resp. rate 18, height 5\' 4"  (1.626 m), weight 198 lb 12.8 oz (90.2 kg), SpO2 100%.    Lymphatics: No cervical, supraclavicular, axillary, or inguinal nodes Resp: Lungs clear bilaterally Cardio: Irregular GI: No hepatosplenomegaly, mild tenderness in the left upper abdomen Vascular: No leg edema   Lab Results:  Lab Results  Component Value Date   WBC 6.6 09/30/2023   HGB 14.1 09/30/2023   HCT 42.1 09/30/2023   MCV 91.0 09/30/2023   PLT 211.0 09/30/2023   NEUTROABS 3.7 09/30/2023    CMP  Lab Results  Component Value Date   NA 138 09/30/2023   K 3.6 09/30/2023   CL 100 09/30/2023   CO2 27 09/30/2023   GLUCOSE 100 (H) 09/30/2023   BUN 22 09/30/2023   CREATININE 1.33 (H) 09/30/2023   CALCIUM 9.5 09/30/2023   PROT 7.5 09/30/2023   ALBUMIN  4.6 09/30/2023   AST 16 09/30/2023   ALT 16 09/30/2023   ALKPHOS 62 09/30/2023   BILITOT 1.0 09/30/2023   GFRNONAA 49 (L) 08/19/2020   GFRAA 57 (L) 08/19/2020    No results found for: "CEA1", "CEA", "ZOX096", "CA125"  Lab Results  Component Value Date   INR 3.2 (A) 12/22/2023   LABPROT 27.5 (H) 08/02/2013    Imaging:  No results found.  Medications: I have reviewed the patient's current medications.   Assessment/Plan: Multiple gastric carcinoid tumors Upper endoscopy 05/05/2019 with resection of multiple gastric carcinoid tumors EUS 07/12/2019-resection of multiple gastric carcinoid tumors, positive resection margin at the gastric cardia polypectomy site, tubular adenomas and  hyperplastic polyps removed from the lesser curvature Elevated chromogranin A level CT abdomen/pelvis 05/18/2019-no evidence of a gastric mass or metastatic disease Netspot  08/11/2019-focal activity corresponding to a 2-3 mm lesion in the mid pancreas, diffuse activity in the stomach EGD 10/09/2019- 3 areas of scar/clip/polypoid tissue removed with negative pathology EUS 10/09/2019-4 mm pancreas body lesion, FNA biopsy negative, no adenopathy EUS 06/19/2020-no evidence of recurrent tumor in areas of gastric body scars, nodularity was noted underneath 2 endoclips in the greater curvature, this area was biopsied and polyps were removed from the middle gastric body and proximal gastric antrum, biopsies from erosions and erythema were taken at the greater curvature, lesser curvature, and antrum.  An irregular lesion was identified in the pancreas body measuring 3.4 x 4.6 mm, the pancreas lesion was biopsied.  The pathology from the pancreas FNA was negative, the pathology from multiple biopsies including the gastric body scar, antrum polyp, greater curvature biopsy, lesser curvature biopsy, and gastric cardia biopsy revealed low-grade neuroendocrine tumor Netspot  04/14/2021-no residual avid tumor in the stomach, no evidence of tracer avid metastatic disease, decrease in tracer activity associated with the pancreas lesion Upper endoscopy 06/12/2021-5 gastric polyps resected and retrieved-hyperplastic polyp with well differentiated neuroendocrine tumor, stomach cardia and fundus biopsy-reactive gastropathy with neuroendocrine dysplasia, lesser curvature biopsy-reactive gastropathy with neuroendocrine hyperplasia Upper endoscopy 05/28/2022-multiple polyps removed from the stomach and areas of chronic gastritis were biopsied-hyperplastic polyps, reactive gastropathy, mild chronic gastritis, negative for H. 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   Hypothyroidism 7.  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  Family history of uterine and breast cancer 9.  Gastric and colonic adenomas Colonoscopy 06/12/2021-polyps removed from the cecum, ascending colon, and descending colon-tubular adenomas and hyperplastic polyp 10.  Hypodense pancreas lesion 05/18/2019 -positive Netspot  uptake 08/11/2019, negative FNA biopsy 11.  Atrial fibrillation     Disposition: Erin Kirk appears stable.  The chromogranin a level was lower last week.  There is no clinical evidence for progression of the carcinoid tumor.  She will return for an office visit and chromogranin a level in 8 months. She appears to be in atrial fibrillation today.  She is asymptomatic.  She is on anticoagulation therapy.  [name], MD  01/20/2024  12:05 PM

## 2024-02-02 ENCOUNTER — Ambulatory Visit

## 2024-02-02 DIAGNOSIS — Z7901 Long term (current) use of anticoagulants: Secondary | ICD-10-CM

## 2024-02-02 LAB — POCT INR: INR: 3.9 — AB (ref 2.0–3.0)

## 2024-02-02 NOTE — Progress Notes (Signed)
 Hold dose today and then change weekly dose to take 1/2 tablet daily except take 1 tablet on Tuesday, Thursday, Saturday. Recheck in 3 weeks.

## 2024-02-02 NOTE — Patient Instructions (Addendum)
 Pre visit review using our clinic review tool, if applicable. No additional management support is needed unless otherwise documented below in the visit note.  Hold dose today and then change weekly dose to take 1/2 tablet daily except take 1 tablet on Tuesday, Thursday, Saturday. Recheck in 3 weeks.

## 2024-02-13 ENCOUNTER — Other Ambulatory Visit: Payer: Self-pay | Admitting: Family Medicine

## 2024-02-15 ENCOUNTER — Telehealth: Payer: Self-pay | Admitting: Family Medicine

## 2024-02-15 NOTE — Telephone Encounter (Unsigned)
 Copied from CRM [id number]. Topic: General - Other >> Feb 15, 2024 11:38 AM [place] E wrote: Reason for CRM: Patient received a letter that she has jury duty but she is not able to do this physically and emotionally. Patient questioning which steps are needed in order to get her out of jury duty. Callback number for patient is [phone].

## 2024-02-15 NOTE — Telephone Encounter (Signed)
 See below

## 2024-02-15 NOTE — Telephone Encounter (Signed)
 You can write her the following letter but all the information on formally requesting this can be completed by her through the [facility] jury service information page [email or web address]   To whom it may concern,  Patient unfortunately has neuroendocrine carcinoma and remains on active surveillance for this and also recently lost her primary support in her husband in recent years.  We are requesting that she be excused from jury duty if possible due to the neuroendocrine carcinoma and emotional toll that she has been through.  Thanks, Clarisa Crooked, MD Patient's primary care physician

## 2024-02-16 NOTE — Telephone Encounter (Signed)
 Called and made pt aware that letter has been printed and placed up front for pick up.

## 2024-02-20 ENCOUNTER — Other Ambulatory Visit: Payer: Self-pay | Admitting: Family Medicine

## 2024-02-23 ENCOUNTER — Ambulatory Visit (INDEPENDENT_AMBULATORY_CARE_PROVIDER_SITE_OTHER)

## 2024-02-23 DIAGNOSIS — Z7901 Long term (current) use of anticoagulants: Secondary | ICD-10-CM

## 2024-02-23 LAB — POCT INR: INR: 2.6 (ref 2.0–3.0)

## 2024-02-23 NOTE — Progress Notes (Signed)
 Continue 1/2 tablet daily except take 1 tablet on Tuesday, Thursday, Saturday. Recheck in 4 weeks.

## 2024-02-23 NOTE — Patient Instructions (Addendum)
 Pre visit review using our clinic review tool, if applicable. No additional management support is needed unless otherwise documented below in the visit note.  Continue 1/2 tablet daily except take 1 tablet on Tuesday, Thursday, Saturday. Recheck in 4 weeks.

## 2024-02-23 NOTE — Progress Notes (Signed)
 I have reviewed and agree with note, evaluation, plan.   Tana Conch, MD

## 2024-02-28 ENCOUNTER — Other Ambulatory Visit: Payer: Self-pay | Admitting: Family Medicine

## 2024-02-29 ENCOUNTER — Ambulatory Visit (INDEPENDENT_AMBULATORY_CARE_PROVIDER_SITE_OTHER): Payer: Medicare Other

## 2024-02-29 VITALS — Ht 64.5 in | Wt 198.0 lb

## 2024-02-29 DIAGNOSIS — Z1211 Encounter for screening for malignant neoplasm of colon: Secondary | ICD-10-CM | POA: Diagnosis not present

## 2024-02-29 DIAGNOSIS — Z0000 Encounter for general adult medical examination without abnormal findings: Secondary | ICD-10-CM

## 2024-02-29 DIAGNOSIS — E785 Hyperlipidemia, unspecified: Secondary | ICD-10-CM | POA: Diagnosis not present

## 2024-02-29 DIAGNOSIS — Z860100 Personal history of colon polyps, unspecified: Secondary | ICD-10-CM | POA: Diagnosis not present

## 2024-02-29 DIAGNOSIS — E1169 Type 2 diabetes mellitus with other specified complication: Secondary | ICD-10-CM | POA: Diagnosis not present

## 2024-02-29 NOTE — Progress Notes (Signed)
 Subjective:   Erin Kirk is a 68 y.o. who presents for a Medicare Wellness preventive visit.  As a reminder, Annual Wellness Visits don't include a physical exam, and some assessments may be limited, especially if this visit is performed virtually. We may recommend an in-person follow-up visit with your provider if needed.  Visit Complete: Virtual I connected with  Erin Kirk on 02/29/24 by a audio enabled telemedicine application and verified that I am speaking with the correct person using two identifiers.  Patient Location: Home  Provider Location: Office/Clinic  I discussed the limitations of evaluation and management by telemedicine. The patient expressed understanding and agreed to proceed.  Vital Signs: Because this visit was a virtual/telehealth visit, some criteria may be missing or patient reported. Any vitals not documented were not able to be obtained and vitals that have been documented are patient reported.  VideoDeclined- This patient declined Librarian, academic. Therefore the visit was completed with audio only.  Persons Participating in Visit: Patient.  AWV Questionnaire: No: Patient Medicare AWV questionnaire was not completed prior to this visit.  Cardiac Risk Factors include: advanced age (>80men, >43 women);dyslipidemia;diabetes mellitus;obesity (BMI >30kg/m2);hypertension     Objective:     Today's Vitals   02/29/24 1009  Weight: 198 lb (89.8 kg)  Height: 5' 4.5" (1.638 m)   Body mass index is 33.46 kg/m.     02/29/2024   10:14 AM 01/20/2024   11:43 AM 07/21/2023   11:43 AM 02/23/2023   10:38 AM 07/17/2022   12:07 PM 05/28/2022    9:47 AM 02/03/2022   10:10 AM  Advanced Directives  Does Patient Have a Medical Advance Directive? Yes Yes Yes Yes Yes Yes Yes  Type of Estate agent of [place];Living will Healthcare Power of [place];Living will Healthcare Power of [place];Living will Healthcare  Power of [place];Living will Healthcare Power of [place];Living will  Living will  Does patient want to make changes to medical advance directive?  No - Patient declined No - Patient declined  No - Patient declined    Copy of Healthcare Power of Attorney in Chart? No - copy requested No - copy requested No - copy requested No - copy requested No - copy requested      Current Medications (verified) Outpatient Encounter Medications as of 02/29/2024  Medication Sig   acetaminophen  (TYLENOL ) 500 MG tablet Take 500 mg by mouth every 6 (six) hours as needed (for pain.).    Alum Hydroxide-Mag Trisilicate (GAVISCON) 80-14.2 MG CHEW Chew 1-2 tablets by mouth 2 (two) times daily as needed (indigestion).   Cholecalciferol (VITAMIN D3) 50 MCG (2000 UT) TABS Take 2,000 Units by mouth daily.   diltiazem  (CARDIZEM  CD) 360 MG 24 hr capsule Take 1 capsule by mouth at bedtime   docusate sodium (COLACE) 100 MG capsule Take 100 mg by mouth at bedtime. Stool softner   ferrous sulfate  325 (65 FE) MG tablet Take 1 tablet (325 mg total) by mouth daily with breakfast.   levothyroxine  (SYNTHROID ) 100 MCG tablet Take 1 tablet by mouth once daily   loratadine (CLARITIN) 10 MG tablet Take 10 mg by mouth daily as needed for allergies.   lovastatin  (MEVACOR ) 10 MG tablet Take 1 tablet (10 mg total) by mouth once a week.   metoprolol  succinate (TOPROL -XL) 25 MG 24 hr tablet Take 1 tablet by mouth once daily   omeprazole  (PRILOSEC) 40 MG capsule Take 1 capsule by mouth once daily   telmisartan  (MICARDIS )  40 MG tablet Take 1 tablet by mouth once daily   triamterene -hydrochlorothiazide  (MAXZIDE -25) 37.5-25 MG tablet Take 1 tablet by mouth once daily   vitamin B-12 (CYANOCOBALAMIN ) 1000 MCG tablet Take 1,000 mcg by mouth daily.   warfarin (COUMADIN ) 5 MG tablet TAKE 1 TABLET BY MOUTH DAILY EXCEPT TAKE 1/2 TABLET ON MONDAYS AND THURSDAYS OR AS DIRECTED BY ANTICOAGULATION CLINIC   Facility-Administered Encounter Medications as  of 02/29/2024  Medication   phenazopyridine  (PYRIDIUM ) tablet 100 mg    Allergies (verified) Iohexol , Amoxicillin-pot clavulanate, and Other   History: Past Medical History:  Diagnosis Date   Allergy    Anxiety    Arthritis    Asthma    many years ago   Blood transfusion without reported diagnosis    Cataract    Chronic kidney disease    Clotting disorder (HCC)    Depression    Husband died 06/26/2019   Diabetes (HCC)    not on medication   DVT (deep venous thrombosis) (HCC)    left arm   Dysrhythmia    Family history of bone cancer    Family history of breast cancer    Family history of lung cancer    Family history of ovarian cancer    Family history of testicular cancer    Family history of uterine cancer    GERD (gastroesophageal reflux disease)    Headache    Hypertension    Hypothyroidism    Iron  deficiency anemia 03/30/2019   Irregular heart beat    tachycardia   Lupus anticoagulant with hypercoagulable state (HCC)    Melanoma (HCC)    2005- face    Paroxysmal atrial fibrillation (HCC)    Plantar fasciitis    Pneumonia 2019   Primary hypercoagulable state (HCC)    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   Pulmonary embolism (HCC)    Thyroid  disease    hypothyroidism   Past Surgical History:  Procedure Laterality Date   ABDOMINAL HYSTERECTOMY     noncancerous   BACK SURGERY  1983   BIOPSY  06/19/2020   Procedure: BIOPSY;  Surgeon: Normie Becton., MD;  Location: [facility]COPY;  Service: Gastroenterology;;   BIOPSY  06/12/2021   Procedure: BIOPSY;  Surgeon: Normie Becton., MD;  Location: [facility] ENDOSCOPY;  Service: Gastroenterology;;   BIOPSY  05/28/2022   Procedure: BIOPSY;  Surgeon: Kenney Peacemaker, MD;  Location: [facility];  Service: Gastroenterology;;   BIOPSY  06/15/2023   Procedure: BIOPSY;  Surgeon: Kenney Peacemaker, MD;  Location: [facility] ENDOSCOPY;  Service: Gastroenterology;;    CHOLECYSTECTOMY     COLONOSCOPY     COLONOSCOPY WITH PROPOFOL  N/A 06/12/2021   Procedure: COLONOSCOPY WITH PROPOFOL ;  Surgeon: Normie Becton., MD;  Location: [facility] ENDOSCOPY;  Service: Gastroenterology;  Laterality: N/A;   ENDOSCOPIC MUCOSAL RESECTION N/A 07/12/2019   Procedure: ENDOSCOPIC MUCOSAL RESECTION;  Surgeon: Brice Campi Albino Alu., MD;  Location: [facility] ENDOSCOPY;  Service: Gastroenterology;  Laterality: N/A;   ENDOSCOPIC MUCOSAL RESECTION  06/19/2020   Procedure: ENDOSCOPIC MUCOSAL RESECTION;  Surgeon: Brice Campi Albino Alu., MD;  Location: [facility] ENDOSCOPY;  Service: Gastroenterology;;   ESOPHAGOGASTRODUODENOSCOPY N/A 06/19/2020   Procedure: ESOPHAGOGASTRODUODENOSCOPY (EGD);  Surgeon: Normie Becton., MD;  Location: [facility];  Service: Gastroenterology;  Laterality: N/A;   ESOPHAGOGASTRODUODENOSCOPY (EGD) WITH PROPOFOL  N/A 07/12/2019   Procedure: ESOPHAGOGASTRODUODENOSCOPY (EGD) WITH PROPOFOL ;  Surgeon: Brice Campi Albino Alu., MD;  Location: [facility] ENDOSCOPY;  Service: Gastroenterology;  Laterality: N/A;   ESOPHAGOGASTRODUODENOSCOPY (EGD) WITH  PROPOFOL  N/A 10/09/2019   Procedure: ESOPHAGOGASTRODUODENOSCOPY (EGD) WITH PROPOFOL ;  Surgeon: Brice Campi Albino Alu., MD;  Location: [facility] ENDOSCOPY;  Service: Gastroenterology;  Laterality: N/A;   ESOPHAGOGASTRODUODENOSCOPY (EGD) WITH PROPOFOL  N/A 06/12/2021   Procedure: ESOPHAGOGASTRODUODENOSCOPY (EGD) WITH PROPOFOL ;  Surgeon: Brice Campi Albino Alu., MD;  Location: [facility] ENDOSCOPY;  Service: Gastroenterology;  Laterality: N/A;   ESOPHAGOGASTRODUODENOSCOPY (EGD) WITH PROPOFOL  N/A 05/28/2022   Procedure: ESOPHAGOGASTRODUODENOSCOPY (EGD) WITH PROPOFOL ;  Surgeon: Kenney Peacemaker, MD;  Location: [facility] ENDOSCOPY;  Service: Gastroenterology;  Laterality: N/A;   ESOPHAGOGASTRODUODENOSCOPY (EGD) WITH PROPOFOL  N/A 06/15/2023   Procedure: ESOPHAGOGASTRODUODENOSCOPY (EGD) WITH PROPOFOL ;  Surgeon: Kenney Peacemaker, MD;  Location: [facility] ENDOSCOPY;  Service:  Gastroenterology;  Laterality: N/A;   EUS N/A 07/12/2019   Procedure: UPPER ENDOSCOPIC ULTRASOUND (EUS) RADIAL;  Surgeon: Normie Becton., MD;  Location: [facility] ENDOSCOPY;  Service: Gastroenterology;  Laterality: N/A;   EUS N/A 10/09/2019   Procedure: UPPER ENDOSCOPIC ULTRASOUND (EUS) RADIAL;  Surgeon: Normie Becton., MD;  Location: [facility] ENDOSCOPY;  Service: Gastroenterology;  Laterality: N/A;   fatty tumor breast Right    removed   FINE NEEDLE ASPIRATION  06/19/2020   Procedure: FINE NEEDLE ASPIRATION (FNA) LINEAR;  Surgeon: Normie Becton., MD;  Location: [facility];  Service: Gastroenterology;;   FOREIGN BODY REMOVAL  10/09/2019   Procedure: FOREIGN BODY REMOVAL;  Surgeon: Normie Becton., MD;  Location: [facility] ENDOSCOPY;  Service: Gastroenterology;;   FOREIGN BODY REMOVAL  06/19/2020   Procedure: FOREIGN BODY REMOVAL;  Surgeon: Normie Becton., MD;  Location: [facility];  Service: Gastroenterology;;  polyp removal with net    HEMOSTASIS CLIP PLACEMENT  07/12/2019   Procedure: HEMOSTASIS CLIP PLACEMENT;  Surgeon: Normie Becton., MD;  Location: [facility] ENDOSCOPY;  Service: Gastroenterology;;   HEMOSTASIS CLIP PLACEMENT  10/09/2019   Procedure: HEMOSTASIS CLIP PLACEMENT;  Surgeon: Normie Becton., MD;  Location: [facility] ENDOSCOPY;  Service: Gastroenterology;;   HEMOSTASIS CLIP PLACEMENT  06/19/2020   Procedure: HEMOSTASIS CLIP PLACEMENT;  Surgeon: Normie Becton., MD;  Location: [facility];  Service: Gastroenterology;;   HEMOSTASIS CLIP PLACEMENT  06/12/2021   Procedure: HEMOSTASIS CLIP PLACEMENT;  Surgeon: Normie Becton., MD;  Location: [facility] ENDOSCOPY;  Service: Gastroenterology;;   HEMOSTASIS CLIP PLACEMENT  05/28/2022   Procedure: HEMOSTASIS CLIP PLACEMENT;  Surgeon: Kenney Peacemaker, MD;  Location: [facility] ENDOSCOPY;  Service: Gastroenterology;;   HEMOSTASIS CLIP PLACEMENT  06/15/2023   Procedure: HEMOSTASIS CLIP PLACEMENT;  Surgeon: Kenney Peacemaker, MD;  Location: [facility] ENDOSCOPY;  Service: Gastroenterology;;   HEMOSTASIS CONTROL  07/12/2019   Procedure: HEMOSTASIS CONTROL;  Surgeon: Normie Becton., MD;  Location: [facility] ENDOSCOPY;  Service: Gastroenterology;;   HYSTEROTOMY     KNEE ARTHROSCOPY Left    Lymp nodes Right 08/2004   MELANOMA EXCISION Right 2005   face    OOPHORECTOMY Bilateral    POLYPECTOMY  07/12/2019   Procedure: POLYPECTOMY;  Surgeon: Normie Becton., MD;  Location: [facility] ENDOSCOPY;  Service: Gastroenterology;;   POLYPECTOMY  10/09/2019   Procedure: POLYPECTOMY;  Surgeon: Normie Becton., MD;  Location: [facility] ENDOSCOPY;  Service: Gastroenterology;;   POLYPECTOMY  06/19/2020   Procedure: POLYPECTOMY;  Surgeon: Normie Becton., MD;  Location: [facility];  Service: Gastroenterology;;   POLYPECTOMY  06/12/2021   Procedure: POLYPECTOMY;  Surgeon: Normie Becton., MD;  Location: [facility] ENDOSCOPY;  Service: Gastroenterology;;   POLYPECTOMY  05/28/2022   Procedure: POLYPECTOMY;  Surgeon: Kenney Peacemaker, MD;  Location: [facility] ENDOSCOPY;  Service: Gastroenterology;;   POLYPECTOMY  06/15/2023   Procedure: POLYPECTOMY;  Surgeon: Kenney Peacemaker, MD;  Location: [facility]OSCOPY;  Service: Gastroenterology;;   removal lymphnodes in right neck     SUBMUCOSAL LIFTING INJECTION  07/12/2019   Procedure: SUBMUCOSAL LIFTING INJECTION;  Surgeon: Normie Becton., MD;  Location: [facility] ENDOSCOPY;  Service: Gastroenterology;;   SUBMUCOSAL LIFTING INJECTION  10/09/2019   Procedure: SUBMUCOSAL LIFTING INJECTION;  Surgeon: Normie Becton., MD;  Location: [facility] ENDOSCOPY;  Service: Gastroenterology;;   Jona Negro INJECTION  06/12/2021   Procedure: SUBMUCOSAL LIFTING INJECTION;  Surgeon: Normie Becton., MD;  Location: [facility] ENDOSCOPY;  Service: Gastroenterology;;   SUBMUCOSAL LIFTING INJECTION  05/28/2022   Procedure: SUBMUCOSAL LIFTING INJECTION;  Surgeon: Kenney Peacemaker, MD;  Location: [facility];   Service: Gastroenterology;;   UPPER ESOPHAGEAL ENDOSCOPIC ULTRASOUND (EUS) N/A 06/19/2020   Procedure: UPPER ESOPHAGEAL ENDOSCOPIC ULTRASOUND (EUS);  Surgeon: Normie Becton., MD;  Location: [facility];  Service: Gastroenterology;  Laterality: N/A;   UPPER GASTROINTESTINAL ENDOSCOPY     Family History  Problem Relation Age of Onset   Coronary artery disease Mother        Mom 59, Dad 57-smoker   Diabetes Mother    Ovarian cancer Mother 41       not officially diagnosed   Hypertension Father    Diabetes Father    Mitral valve prolapse Sister    Diabetes Sister    Breast cancer Maternal Aunt 68   Alzheimer's disease Maternal Aunt    Breast cancer Half-Sister 74   Breast cancer Half-Sister 34   Lung cancer Nephew    Bone cancer Nephew 64   Uterine cancer Niece 75   Breast cancer Niece        diagnosed mid-60s   Cancer Niece 64       unknown type   Breast cancer Other        maternal great-aunt, diagnosed in her 55s   Breast cancer Other        4th degree maternal relative, diagnosed in her 73s   Colon cancer Neg Hx    Esophageal cancer Neg Hx    Pancreatic cancer Neg Hx    Rectal cancer Neg Hx    Stomach cancer Neg Hx    Inflammatory bowel disease Neg Hx    Liver disease Neg Hx    Colon polyps Neg Hx    Social History   Socioeconomic History   Marital status: Widowed    Spouse name: Not on file   Number of children: Not on file   Years of education: Not on file   Highest education level: Not on file  Occupational History   Not on file  Tobacco Use   Smoking status: Never   Smokeless tobacco: Never  Vaping Use   Vaping status: Never Used  Substance and Sexual Activity   Alcohol use: Never    Comment: no    Drug use: Never   Sexual activity: Yes  Other Topics Concern   Not on file  Social History Narrative   Married 1971. 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    Social Drivers of Health   Financial Resource Strain: Low Risk  (02/29/2024)   Overall Financial Resource Strain (CARDIA)    Difficulty of  Paying Living Expenses: Not hard at all  Food Insecurity: No Food Insecurity (02/29/2024)   Hunger Vital Sign    Worried About Running Out of Food in the Last Year: Never true    Ran Out of Food in the Last Year: Never true  Transportation Needs: No Transportation Needs (02/29/2024)   PRAPARE - Administrator, Civil Service (Medical): No    Lack of Transportation (Non-Medical): No  Physical Activity: Sufficiently Active (02/29/2024)   Exercise Vital Sign    Days of Exercise per Week: 5 days    Minutes of Exercise per Session: 60 min  Stress: No Stress Concern Present (02/29/2024)   [employer] of Occupational Health - Occupational Stress Questionnaire    Feeling of Stress : Not at all  Social Connections: Moderately Isolated (02/29/2024)   Social Connection and Isolation Panel [NHANES]    Frequency of Communication with Friends and Family: More than three times a week    Frequency of Social Gatherings with Friends and Family: More than three times a week    Attends Religious Services: More than 4 times per year    Active Member of [employer] or Organizations: No    Attends Banker Meetings: Never    Marital Status: Widowed    Tobacco Counseling Counseling given: Not Answered    Clinical Intake:  Pre-visit preparation completed: Yes  Pain : No/denies pain     BMI - recorded: 33.46 Nutritional Status: BMI > 30  Obese Nutritional Risks: None Diabetes: Yes CBG done?: No Did pt. bring in CBG monitor from home?: No  Lab Results  Component Value Date   HGBA1C 6.5 09/30/2023   HGBA1C 6.3 03/19/2023   HGBA1C 6.1 09/09/2022     How often do you need to have someone help you when you read instructions,  pamphlets, or other written materials from your doctor or pharmacy?: 1 - Never  Interpreter Needed?: No  Information entered by :: Lamont Pilsner, LPN   Activities of Daily Living     02/29/2024   10:10 AM  In your present state of health, do you have any difficulty performing the following activities:  Hearing? 0  Vision? 0  Difficulty concentrating or making decisions? 0  Walking or climbing stairs? 0  Dressing or bathing? 0  Doing errands, shopping? 0  Preparing Food and eating ? N  Using the Toilet? N  In the past six months, have you accidently leaked urine? Y  Comment wears a panty liner at times  Do you have problems with loss of bowel control? N  Managing your Medications? N  Managing your Finances? N  Housekeeping or managing your Housekeeping? N    Patient Care Team: Almira Jaeger, MD as PCP - General (Family Medicine) Kenney Peacemaker, MD as Consulting Physician (Gastroenterology) Mansouraty, Albino Alu., MD as Consulting Physician (Gastroenterology) Sumner Ends, MD as Consulting Physician (Oncology) Gaynelle Keeling, MD as Consulting Physician (Dermatology) Rolando Cliche, [facility] as Pharmacist (Pharmacist)  I have updated your Care Teams any recent Medical Services you may have received from other providers in the past year.     Assessment:    This is a routine wellness examination for [place].  Hearing/Vision screen Hearing Screening - Comments:: Pt denies any hearing issues  Vision Screening - Comments:: Wears rx glasses - up to date with routine eye exams with Dr Rochelle Chu lens crafter's     Goals Addressed  This Visit's Progress    Patient Stated       Maintain health and activity to keep Diabetes low        Depression Screen     02/29/2024   10:12 AM 09/30/2023   10:25 AM 09/30/2023   10:22 AM 03/19/2023    9:59 AM 02/23/2023   10:37 AM 02/03/2022   10:07 AM 09/09/2021    1:09 PM  PHQ 2/9 Scores  PHQ - 2 Score 0 0 0 0 1 1 0   PHQ- 9 Score  3 3 2        Fall Risk     02/29/2024   10:15 AM 09/30/2023   10:22 AM 03/19/2023    9:54 AM 02/23/2023   10:39 AM 06/26/2022    4:34 PM  Fall Risk   Falls in the past year? 0 0 1 1 0  Number falls in past yr: 0 0 0 1 0  Injury with Fall? 0 0 0 1 0  Comment    sore   Risk for fall due to : No Fall Risks No Fall Risks History of fall(s) Impaired vision No Fall Risks  Follow up Falls prevention discussed Falls evaluation completed Falls evaluation completed Falls prevention discussed Falls evaluation completed    MEDICARE RISK AT HOME:  Medicare Risk at Home Any stairs in or around the home?: Yes If so, are there any without handrails?: No Home free of loose throw rugs in walkways, pet beds, electrical cords, etc?: Yes Adequate lighting in your home to reduce risk of falls?: Yes Life alert?: No Use of a cane, walker or w/c?: No Grab bars in the bathroom?: Yes Shower chair or bench in shower?: No Elevated toilet seat or a handicapped toilet?: No  TIMED UP AND GO:  Was the test performed?  No  Cognitive Function: 6CIT completed    12/23/2016   12:08 PM  MMSE - Mini Mental State Exam  Not completed: --        02/29/2024   10:16 AM 02/23/2023   10:41 AM 02/03/2022   10:14 AM  6CIT Screen  What Year? 0 points 0 points 0 points  What month? 0 points 0 points 0 points  What time? 0 points 0 points 0 points  Count back from 20 0 points 0 points 0 points  Months in reverse 0 points 0 points 0 points  Repeat phrase 0 points 0 points 0 points  Total Score 0 points 0 points 0 points    Immunizations Immunization History  Administered Date(s) Administered   Influenza Split 06/30/2011, 06/29/2012, 06/08/2022   Influenza Whole 07/26/2007, 06/15/2008, 07/25/2009   Influenza, High Dose Seasonal PF 06/23/2019   Influenza,inj,Quad PF,6+ Mos 07/10/2013, 06/13/2014, 06/14/2015, 05/14/2016, 05/27/2017, 07/19/2018   Influenza-Unspecified 05/23/2020, 06/17/2021, 05/23/2023    PFIZER Comirnaty(Gray Top)Covid-19 Tri-Sucrose Vaccine 04/23/2021   PFIZER(Purple Top)SARS-COV-2 Vaccination 10/14/2019, 11/04/2019, 06/28/2020   PNEUMOCOCCAL CONJUGATE-20 03/17/2022   Pfizer Covid-19 Vaccine Bivalent Booster 80yrs & up 07/04/2021, 06/22/2022   Pfizer Covid-19 Vaccine Bivalent Booster 5y-11y 05/23/2023   Pfizer(Comirnaty)Fall Seasonal Vaccine 12 years and older 05/23/2023   Pneumococcal Polysaccharide-23 07/26/2014, 02/14/2020   Respiratory Syncytial Virus Vaccine,Recomb Aduvanted(Arexvy) 09/02/2022   Td 09/22/2003   Tdap 03/18/2018   Zoster Recombinant(Shingrix) 03/03/2022, 06/08/2022    Screening Tests Health Maintenance  Topic Date Due   OPHTHALMOLOGY EXAM  03/14/2023   COVID-19 Vaccine (8 - 2024-25 season) 07/18/2023   Diabetic kidney evaluation - Urine ACR  03/18/2024   Colonoscopy  06/12/2024  FOOT EXAM  03/18/2024   HEMOGLOBIN A1C  03/29/2024   INFLUENZA VACCINE  04/21/2024   Diabetic kidney evaluation - eGFR measurement  09/29/2024   Medicare Annual Wellness (AWV)  02/28/2025   MAMMOGRAM  10/18/2025   DTaP/Tdap/Td (3 - Td or Tdap) 03/18/2028   Pneumonia Vaccine 66+ Years old  Completed   DEXA SCAN  Completed   Hepatitis C Screening  Completed   Zoster Vaccines- Shingrix  Completed   HPV VACCINES  Aged Out   Meningococcal B Vaccine  Aged Out    Health Maintenance  Health Maintenance Due  Topic Date Due   OPHTHALMOLOGY EXAM  03/14/2023   COVID-19 Vaccine (8 - 2024-25 season) 07/18/2023   Diabetic kidney evaluation - Urine ACR  03/18/2024   Colonoscopy  06/12/2024   Health Maintenance Items Addressed: See Nurse Notes at the end of this note  Additional Screening:  Vision Screening: Recommended annual ophthalmology exams for early detection of glaucoma and other disorders of the eye. Would you like a referral to an eye doctor? No    Dental Screening: Recommended annual dental exams for proper oral hygiene  Community Resource Referral /  Chronic Care Management: CRR required this visit?  Yes   CCM required this visit?  No   Plan:    I have personally reviewed and noted the following in the patient's chart:   Medical and social history Use of alcohol, tobacco or illicit drugs  Current medications and supplements including opioid prescriptions. Patient is not currently taking opioid prescriptions. Functional ability and status Nutritional status Physical activity Advanced directives List of other physicians Hospitalizations, surgeries, and ER visits in previous 12 months Vitals Screenings to include cognitive, depression, and falls Referrals and appointments  In addition, I have reviewed and discussed with patient certain preventive protocols, quality metrics, and best practice recommendations. A written personalized care plan for preventive services as well as general preventive health recommendations were provided to patient.   Bruno Capri, LPN   1/61/0960   After Visit Summary: (MyChart) Due to this being a telephonic visit, the after visit summary with patients personalized plan was offered to patient via MyChart   Notes: Nothing significant to report at this time.

## 2024-02-29 NOTE — Patient Instructions (Signed)
 [name] , Thank you for taking time out of your busy schedule to complete your Annual Wellness Visit with me. I enjoyed our conversation and look forward to speaking with you again next year. I, as well as your care team,  appreciate your ongoing commitment to your health goals. Please review the following plan we discussed and let me know if I can assist you in the future. Your Game plan/ To Do List    Referrals: If you haven't heard from the office you've been referred to, please reach out to them at the phone provided.   Follow up Visits: Next Medicare AWV with our clinical staff: 03/05/25   Have you seen your provider in the last 6 months (3 months if uncontrolled diabetes)? Yes Next Office Visit with your provider: 03/30/24  Clinician Recommendations:  Aim for 30 minutes of exercise or brisk walking, 6-8 glasses of water, and 5 servings of fruits and vegetables each day.       This is a list of the screening recommended for you and due dates:  Health Maintenance  Topic Date Due   Eye exam for diabetics  03/14/2023   COVID-19 Vaccine (8 - 2024-25 season) 07/18/2023   Yearly kidney health urinalysis for diabetes  03/18/2024   Colon Cancer Screening  06/12/2024   Complete foot exam   03/18/2024   Hemoglobin A1C  03/29/2024   Flu Shot  04/21/2024   Yearly kidney function blood test for diabetes  09/29/2024   Medicare Annual Wellness Visit  02/28/2025   Mammogram  10/18/2025   DTaP/Tdap/Td vaccine (3 - Td or Tdap) 03/18/2028   Pneumonia Vaccine  Completed   DEXA scan (bone density measurement)  Completed   Hepatitis C Screening  Completed   Zoster (Shingles) Vaccine  Completed   HPV Vaccine  Aged Out   Meningitis B Vaccine  Aged Out    Advanced directives: (Copy Requested) Please bring a copy of your health care power of attorney and living will to the office to be added to your chart at your convenience. You can mail to [facility] [address] [place],  [place] [address] or email to [email or web address] Advance Care Planning is important because it:  [x]  Makes sure you receive the medical care that is consistent with your values, goals, and preferences  [x]  It provides guidance to your family and loved ones and reduces their decisional burden about whether or not they are making the right decisions based on your wishes.  Follow the link provided in your after visit summary or read over the paperwork we have mailed to you to help you started getting your Advance Directives in place. If you need assistance in completing these, please reach out to us  so that we can help you!  See attachments for Preventive Care and Fall Prevention Tips.

## 2024-03-22 ENCOUNTER — Ambulatory Visit

## 2024-03-22 DIAGNOSIS — Z7901 Long term (current) use of anticoagulants: Secondary | ICD-10-CM | POA: Diagnosis not present

## 2024-03-22 LAB — POCT INR: INR: 2.7 (ref 2.0–3.0)

## 2024-03-22 NOTE — Progress Notes (Signed)
 Continue 1/2 tablet daily except take 1 tablet on Tuesday, Thursday, Saturday. Recheck in 4 weeks. Can change to 6 weeks if next INR is in range.

## 2024-03-22 NOTE — Patient Instructions (Addendum)
 Pre visit review using our clinic review tool, if applicable. No additional management support is needed unless otherwise documented below in the visit note.  Continue 1/2 tablet daily except take 1 tablet on Tuesday, Thursday, Saturday. Recheck in 4 weeks.

## 2024-03-22 NOTE — Progress Notes (Signed)
 I have reviewed the patient's encounter and agree with the documentation.  Worth HERO. Kennyth, MD 03/22/2024 10:31 AM

## 2024-03-30 ENCOUNTER — Ambulatory Visit: Payer: Medicare Other | Admitting: Family Medicine

## 2024-03-30 ENCOUNTER — Encounter: Payer: Self-pay | Admitting: Family Medicine

## 2024-03-30 ENCOUNTER — Ambulatory Visit: Payer: Self-pay | Admitting: Family Medicine

## 2024-03-30 VITALS — BP 104/70 | HR 97 | Temp 97.3°F | Ht 64.5 in | Wt 197.0 lb

## 2024-03-30 DIAGNOSIS — T466X5A Adverse effect of antihyperlipidemic and antiarteriosclerotic drugs, initial encounter: Secondary | ICD-10-CM

## 2024-03-30 DIAGNOSIS — E1169 Type 2 diabetes mellitus with other specified complication: Secondary | ICD-10-CM

## 2024-03-30 DIAGNOSIS — D509 Iron deficiency anemia, unspecified: Secondary | ICD-10-CM

## 2024-03-30 DIAGNOSIS — E669 Obesity, unspecified: Secondary | ICD-10-CM

## 2024-03-30 DIAGNOSIS — M85852 Other specified disorders of bone density and structure, left thigh: Secondary | ICD-10-CM

## 2024-03-30 DIAGNOSIS — G720 Drug-induced myopathy: Secondary | ICD-10-CM

## 2024-03-30 DIAGNOSIS — R7989 Other specified abnormal findings of blood chemistry: Secondary | ICD-10-CM | POA: Diagnosis not present

## 2024-03-30 DIAGNOSIS — E559 Vitamin D deficiency, unspecified: Secondary | ICD-10-CM

## 2024-03-30 DIAGNOSIS — D6859 Other primary thrombophilia: Secondary | ICD-10-CM | POA: Diagnosis not present

## 2024-03-30 DIAGNOSIS — Z0000 Encounter for general adult medical examination without abnormal findings: Secondary | ICD-10-CM

## 2024-03-30 LAB — IBC + FERRITIN
Ferritin: 29.7 ng/mL (ref 10.0–291.0)
Iron: 74 ug/dL (ref 42–145)
Saturation Ratios: 19.6 % — ABNORMAL LOW (ref 20.0–50.0)
TIBC: 376.6 ug/dL (ref 250.0–450.0)
Transferrin: 269 mg/dL (ref 212.0–360.0)

## 2024-03-30 LAB — COMPREHENSIVE METABOLIC PANEL WITH GFR
ALT: 18 U/L (ref 0–35)
AST: 17 U/L (ref 0–37)
Albumin: 4.7 g/dL (ref 3.5–5.2)
Alkaline Phosphatase: 63 U/L (ref 39–117)
BUN: 15 mg/dL (ref 6–23)
CO2: 30 meq/L (ref 19–32)
Calcium: 9.4 mg/dL (ref 8.4–10.5)
Chloride: 102 meq/L (ref 96–112)
Creatinine, Ser: 1.23 mg/dL — ABNORMAL HIGH (ref 0.40–1.20)
GFR: 44.65 mL/min — ABNORMAL LOW (ref >60.00)
Glucose, Bld: 107 mg/dL — ABNORMAL HIGH (ref 70–99)
Potassium: 3.8 meq/L (ref 3.5–5.1)
Sodium: 139 meq/L (ref 135–145)
Total Bilirubin: 0.8 mg/dL (ref 0.2–1.2)
Total Protein: 7.1 g/dL (ref 6.0–8.3)

## 2024-03-30 LAB — MICROALBUMIN / CREATININE URINE RATIO
Creatinine,U: 192.9 mg/dL
Microalb Creat Ratio: 8.4 mg/g (ref 0.0–30.0)
Microalb, Ur: 1.6 mg/dL (ref 0.0–1.9)

## 2024-03-30 LAB — CBC WITH DIFFERENTIAL/PLATELET
Basophils Absolute: 0.1 K/uL (ref 0.0–0.1)
Basophils Relative: 0.9 % (ref 0.0–3.0)
Eosinophils Absolute: 0.4 K/uL (ref 0.0–0.7)
Eosinophils Relative: 5.4 % — ABNORMAL HIGH (ref 0.0–5.0)
HCT: 40.6 % (ref 36.0–46.0)
Hemoglobin: 13.7 g/dL (ref 12.0–15.0)
Lymphocytes Relative: 25.8 % (ref 12.0–46.0)
Lymphs Abs: 1.7 K/uL (ref 0.7–4.0)
MCHC: 33.9 g/dL (ref 30.0–36.0)
MCV: 90.4 fl (ref 78.0–100.0)
Monocytes Absolute: 0.5 K/uL (ref 0.1–1.0)
Monocytes Relative: 7 % (ref 3.0–12.0)
Neutro Abs: 4 K/uL (ref 1.4–7.7)
Neutrophils Relative %: 60.9 % (ref 43.0–77.0)
Platelets: 192 K/uL (ref 150.0–400.0)
RBC: 4.49 Mil/uL (ref 3.87–5.11)
RDW: 12.8 % (ref 11.5–15.5)
WBC: 6.6 K/uL (ref 4.0–10.5)

## 2024-03-30 LAB — LIPID PANEL
Cholesterol: 156 mg/dL (ref 0–200)
HDL: 58.7 mg/dL (ref >39.00)
LDL Cholesterol: 74 mg/dL (ref 0–99)
NonHDL: 97.44
Total CHOL/HDL Ratio: 3
Triglycerides: 119 mg/dL (ref 0.0–149.0)
VLDL: 23.8 mg/dL (ref 0.0–40.0)

## 2024-03-30 LAB — VITAMIN B12: Vitamin B-12: >1500 pg/mL — ABNORMAL HIGH (ref 211–911)

## 2024-03-30 LAB — HEMOGLOBIN A1C: Hgb A1c MFr Bld: 6.5 % (ref 4.6–6.5)

## 2024-03-30 LAB — VITAMIN D 25 HYDROXY (VIT D DEFICIENCY, FRACTURES): VITD: 47.39 ng/mL (ref 30.00–100.00)

## 2024-03-30 NOTE — Progress Notes (Signed)
 Phone [phone]   Subjective:  Patient presents today for their annual physical. Chief complaint-noted.   See problem oriented charting- ROS- full  review of systems was completed and negative Per full ROS sheet completed by patient except for topics noted under acute/chronic concerns  The following were reviewed and entered/updated in epic: Past Medical History:  Diagnosis Date   Allergy    Anxiety    Arthritis    Asthma    many years ago   Blood transfusion without reported diagnosis    Cataract    Chronic kidney disease    Clotting disorder (HCC)    Depression    Husband died 06/12/2019   Diabetes (HCC)    not on medication   DVT (deep venous thrombosis) (HCC)    left arm   Dysrhythmia    Family history of bone cancer    Family history of breast cancer    Family history of lung cancer    Family history of ovarian cancer    Family history of testicular cancer    Family history of uterine cancer    GERD (gastroesophageal reflux disease)    Headache    Hypertension    Hypothyroidism    Iron  deficiency anemia 03/30/2019   Irregular heart beat    tachycardia   Lupus anticoagulant with hypercoagulable state (HCC)    Melanoma (HCC)    2005- face    Paroxysmal atrial fibrillation (HCC)    Plantar fasciitis    Pneumonia 2019   Primary hypercoagulable state (HCC)    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   Pulmonary embolism (HCC)    Thyroid  disease    hypothyroidism   Patient Active Problem List   Diagnosis Date Noted   Neuroendocrine carcinoma of stomach (HCC) 07/12/2019    Priority: High   Elevated Chromogranin 07/02/2019    Priority: High   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) 05/12/2019    Priority: High   Low vitamin B12 level 09/07/2016    Priority: High   Disability examination 01/17/2015    Priority: High   Type 2 diabetes mellitus with obesity (HCC) 11/28/2010    Priority:  High   PAROXYSMAL ATRIAL FIBRILLATION 07/02/2010    Priority: High   Primary hypercoagulable state (HCC) 11/30/2007    Priority: High   Osteopenia 02/26/2020    Priority: Medium    Vitamin D  deficiency 07/20/2019    Priority: Medium    Iron  deficiency anemia 03/30/2019    Priority: Medium    Hyperlipidemia associated with type 2 diabetes mellitus (HCC) 03/09/2019    Priority: Medium    Cough variant asthma 10/14/2018    Priority: Medium    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) 11/09/2017    Priority: Medium    Hx of adenomatous polyp of colon 04/10/2016    Priority: Medium    CKD (chronic kidney disease), stage III (HCC) 09/05/2014    Priority: Medium    Hypothyroidism 03/09/2007    Priority: Medium    Essential hypertension 03/09/2007    Priority: Medium    History of Melanoma 03/09/2007    Priority: Medium    Abnormal findings on esophagogastroduodenoscopy (EGD) 07/02/2019    Priority: Low   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    Priority: Low   Multiple gastric polyps 07/02/2019    Priority: Low   Long term (current) use of anticoagulants - warfarin 06/02/2017    Priority: Low   Encounter for therapeutic drug monitoring  10/26/2013    Priority: Low   Left facial pain 09/24/2012    Priority: Low   Oral mucosal lesion 09/24/2012    Priority: Low   TRANSIENT DISORDER INITIATING/MAINTAINING SLEEP 04/02/2010    Priority: Low   Overweight(278.02) 11/29/2009    Priority: Low   LOW BACK PAIN 06/15/2008    Priority: Low   ALLERGIC RHINITIS 03/09/2007    Priority: Low   GERD 03/09/2007    Priority: Low   Gastric polyp 06/15/2023   Chronic gastritis without bleeding 06/15/2023   Pancreatic lesion on EUS and NETSPOT  scan ? neuroendocrine tumor 04/21/2020   Abnormal finding on radiology exam 04/21/2020   Intestinal metaplasia of gastric mucosa 04/21/2020   Acquired thrombophilia (HCC) 02/14/2020   Myalgia due to statin 02/14/2020   Genetic testing 08/15/2019   Family  history of breast cancer    Family history of ovarian cancer    Family history of uterine cancer    Family history of lung cancer    Family history of bone cancer    Family history of testicular cancer    Past Surgical History:  Procedure Laterality Date   ABDOMINAL HYSTERECTOMY     noncancerous   BACK SURGERY  1983   BIOPSY  06/19/2020   Procedure: BIOPSY;  Surgeon: Wilhelmenia Aloha Raddle., MD;  Location: [facility];  Service: Gastroenterology;;   BIOPSY  06/12/2021   Procedure: BIOPSY;  Surgeon: Wilhelmenia Aloha Raddle., MD;  Location: [facility];  Service: Gastroenterology;;   BIOPSY  05/28/2022   Procedure: BIOPSY;  Surgeon: Avram Lupita BRAVO, MD;  Location: [facility];  Service: Gastroenterology;;   BIOPSY  06/15/2023   Procedure: BIOPSY;  Surgeon: Avram Lupita BRAVO, MD;  Location: [facility];  Service: Gastroenterology;;   CHOLECYSTECTOMY     COLONOSCOPY     COLONOSCOPY WITH PROPOFOL  N/A 06/12/2021   Procedure: COLONOSCOPY WITH PROPOFOL ;  Surgeon: Wilhelmenia Aloha Raddle., MD;  Location: [facility] END[facility]COPY;  Service: Gastroenterology;  Laterality: N/A;   ENDOSCOPIC MUCOSAL RESECTION N/A 07/12/2019   Procedure: ENDOSCOPIC MUCOSAL RESECTION;  Surgeon: Wilhelmenia Aloha Raddle., MD;  Location: [facility] ENDOSCOPY;  Service: Gastroenterology;  Laterality: N/A;   ENDOSCOPIC MUCOSAL RESECTION  06/19/2020   Procedure: ENDOSCOPIC MUCOSAL RESECTION;  Surgeon: Wilhelmenia Aloha Raddle., MD;  Location: [facility];  Service: Gastroenterology;;   ESOPHAGOGASTRODUODENOSCOPY N/A 06/19/2020   Procedure: ESOPHAGOGASTRODUODENOSCOPY (EGD);  Surgeon: Wilhelmenia Aloha Raddle., MD;  Location: [facility];  Service: Gastroenterology;  Laterality: N/A;   ESOPHAGOGASTRODUODENOSCOPY (EGD) WITH PROPOFOL  N/A 07/12/2019   Procedure: ESOPHAGOGASTRODUODENOSCOPY (EGD) WITH PROPOFOL ;  Surgeon: Wilhelmenia Aloha Raddle., MD;  Location: [facility] ENDOSCOPY;  Service: Gastroenterology;  Laterality: N/A;   ESOPHAGOGASTRODUODENOSCOPY (EGD) WITH  PROPOFOL  N/A 10/09/2019   Procedure: ESOPHAGOGASTRODUODENOSCOPY (EGD) WITH PROPOFOL ;  Surgeon: Wilhelmenia Aloha Raddle., MD;  Location: [facility] ENDOSCOPY;  Service: Gastroenterology;  Laterality: N/A;   ESOPHAGOGASTRODUODENOSCOPY (EGD) WITH PROPOFOL  N/A 06/12/2021   Procedure: ESOPHAGOGASTRODUODENOSCOPY (EGD) WITH PROPOFOL ;  Surgeon: Wilhelmenia Aloha Raddle., MD;  Location: [facility] ENDOSCOPY;  Service: Gastroenterology;  Laterality: N/A;   ESOPHAGOGASTRODUODENOSCOPY (EGD) WITH PROPOFOL  N/A 05/28/2022   Procedure: ESOPHAGOGASTRODUODENOSCOPY (EGD) WITH PROPOFOL ;  Surgeon: Avram Lupita BRAVO, MD;  Location: [facility];  Service: Gastroenterology;  Laterality: N/A;   ESOPHAGOGASTRODUODENOSCOPY (EGD) WITH PROPOFOL  N/A 06/15/2023   Procedure: ESOPHAGOGASTRODUODENOSCOPY (EGD) WITH PROPOFOL ;  Surgeon: Avram Lupita BRAVO, MD;  Location: [facility];  Service: Gastroenterology;  Laterality: N/A;   EUS N/A 07/12/2019   Procedure: UPPER ENDOSCOPIC ULTRASOUND (EUS) RADIAL;  Surgeon: Wilhelmenia Aloha Raddle., MD;  Location: [facility] ENDOSCOPY;  Service:  Gastroenterology;  Laterality: N/A;   EUS N/A 10/09/2019   Procedure: UPPER ENDOSCOPIC ULTRASOUND (EUS) RADIAL;  Surgeon: Wilhelmenia Aloha Raddle., MD;  Location: [facility] ENDOSCOPY;  Service: Gastroenterology;  Laterality: N/A;   fatty tumor breast Right    removed   FINE NEEDLE ASPIRATION  06/19/2020   Procedure: FINE NEEDLE ASPIRATION (FNA) LINEAR;  Surgeon: Wilhelmenia Aloha Raddle., MD;  Location: [facility];  Service: Gastroenterology;;   FOREIGN BODY REMOVAL  10/09/2019   Procedure: FOREIGN BODY REMOVAL;  Surgeon: Wilhelmenia Aloha Raddle., MD;  Location: [facility] ENDOSCOPY;  Service: Gastroenterology;;   FOREIGN BODY REMOVAL  06/19/2020   Procedure: FOREIGN BODY REMOVAL;  Surgeon: Wilhelmenia Aloha Raddle., MD;  Location: [facility];  Service: Gastroenterology;;  polyp removal with net    HEMOSTASIS CLIP PLACEMENT  07/12/2019   Procedure: HEMOSTASIS CLIP PLACEMENT;  Surgeon: Wilhelmenia Aloha Raddle., MD;  Location: [facility] ENDOSCOPY;  Service: Gastroenterology;;   HEMOSTASIS CLIP PLACEMENT  10/09/2019   Procedure: HEMOSTASIS CLIP PLACEMENT;  Surgeon: Wilhelmenia Aloha Raddle., MD;  Location: [facility] ENDOSCOPY;  Service: Gastroenterology;;   HEMOSTASIS CLIP PLACEMENT  06/19/2020   Procedure: HEMOSTASIS CLIP PLACEMENT;  Surgeon: Wilhelmenia Aloha Raddle., MD;  Location: [facility];  Service: Gastroenterology;;   HEMOSTASIS CLIP PLACEMENT  06/12/2021   Procedure: HEMOSTASIS CLIP PLACEMENT;  Surgeon: Wilhelmenia Aloha Raddle., MD;  Location: [facility] ENDOSCOPY;  Service: Gastroenterology;;   HEMOSTASIS CLIP PLACEMENT  05/28/2022   Procedure: HEMOSTASIS CLIP PLACEMENT;  Surgeon: Avram Lupita BRAVO, MD;  Location: [facility];  Service: Gastroenterology;;   HEMOSTASIS CLIP PLACEMENT  06/15/2023   Procedure: HEMOSTASIS CLIP PLACEMENT;  Surgeon: Avram Lupita BRAVO, MD;  Location: [facility];  Service: Gastroenterology;;   HEMOSTASIS CONTROL  07/12/2019   Procedure: HEMOSTASIS CONTROL;  Surgeon: Wilhelmenia Aloha Raddle., MD;  Location: [facility] ENDOSCOPY;  Service: Gastroenterology;;   HYSTEROTOMY     KNEE ARTHROSCOPY Left    Lymp nodes Right 08/2004   MELANOMA EXCISION Right 2005   face    OOPHORECTOMY Bilateral    POLYPECTOMY  07/12/2019   Procedure: POLYPECTOMY;  Surgeon: Wilhelmenia Aloha Raddle., MD;  Location: [facility] ENDOSCOPY;  Service: Gastroenterology;;   POLYPECTOMY  10/09/2019   Procedure: POLYPECTOMY;  Surgeon: Wilhelmenia Aloha Raddle., MD;  Location: [facility] ENDOSCOPY;  Service: Gastroenterology;;   POLYPECTOMY  06/19/2020   Procedure: POLYPECTOMY;  Surgeon: Wilhelmenia Aloha Raddle., MD;  Location: [facility];  Service: Gastroenterology;;   POLYPECTOMY  06/12/2021   Procedure: POLYPECTOMY;  Surgeon: Wilhelmenia Aloha Raddle., MD;  Location: [facility] ENDOSCOPY;  Service: Gastroenterology;;   POLYPECTOMY  05/28/2022   Procedure: POLYPECTOMY;  Surgeon: Avram Lupita BRAVO, MD;  Location: [facility];  Service: Gastroenterology;;    POLYPECTOMY  06/15/2023   Procedure: POLYPECTOMY;  Surgeon: Avram Lupita BRAVO, MD;  Location: [facility];  Service: Gastroenterology;;   removal lymphnodes in right neck     SUBMUCOSAL LIFTING INJECTION  07/12/2019   Procedure: SUBMUCOSAL LIFTING INJECTION;  Surgeon: Wilhelmenia Aloha Raddle., MD;  Location: [facility] END[facility]COPY;  Service: Gastroenterology;;   SUBMUCOSAL LIFTING INJECTION  10/09/2019   Procedure: SUBMUCOSAL LIFTING INJECTION;  Surgeon: Wilhelmenia Aloha Raddle., MD;  Location: [facility] ENDOSCOPY;  Service: Gastroenterology;;   ROBLEY MEYER INJECTION  06/12/2021   Procedure: SUBMUCOSAL LIFTING INJECTION;  Surgeon: Wilhelmenia Aloha Raddle., MD;  Location: [facility] ENDOSCOPY;  Service: Gastroenterology;;   SUBMUCOSAL LIFTING INJECTION  05/28/2022   Procedure: SUBMUCOSAL LIFTING INJECTION;  Surgeon: Avram Lupita BRAVO, MD;  Location: [facility];  Service: Gastroenterology;;   UPPER ESOPHAGEAL ENDOSCOPIC ULTRASOUND (EUS) N/A 06/19/2020   Procedure:  UPPER ESOPHAGEAL ENDOSCOPIC ULTRASOUND (EUS);  Surgeon: Wilhelmenia Aloha Raddle., MD;  Location: [facility];  Service: Gastroenterology;  Laterality: N/A;   UPPER GASTROINTESTINAL ENDOSCOPY      Family History  Problem Relation Age of Onset   Coronary artery disease Mother        Mom 69, Dad 57-smoker   Diabetes Mother    Ovarian cancer Mother 36       not officially diagnosed   Hypertension Father    Diabetes Father    Mitral valve prolapse Sister    Lung cancer Sister    Diabetes Sister    Diabetes Sister    Diabetes Sister    Atrial fibrillation Sister    Breast cancer Maternal Aunt 24   Alzheimer's disease Maternal Aunt    Breast cancer Half-Sister 40   Breast cancer Half-Sister 33   Lung cancer Nephew    Bone cancer Nephew 64   Uterine cancer Niece 73   Breast cancer Niece        diagnosed mid-60s   Cancer Niece 62       unknown type   Breast cancer Other        maternal great-aunt, diagnosed in her 2s   Breast cancer Other        4th  degree maternal relative, diagnosed in her 88s   Colon cancer Neg Hx    Esophageal cancer Neg Hx    Pancreatic cancer Neg Hx    Rectal cancer Neg Hx    Stomach cancer Neg Hx    Inflammatory bowel disease Neg Hx    Liver disease Neg Hx    Colon polyps Neg Hx     Medications- reviewed and updated Current Outpatient Medications  Medication Sig Dispense Refill   acetaminophen  (TYLENOL ) 500 MG tablet Take 500 mg by mouth every 6 (six) hours as needed (for pain.).      Alum Hydroxide-Mag Trisilicate (GAVISCON) 80-14.2 MG CHEW Chew 1-2 tablets by mouth 2 (two) times daily as needed (indigestion).     Cholecalciferol (VITAMIN D3) 50 MCG (2000 UT) TABS Take 2,000 Units by mouth daily.     diltiazem  (CARDIZEM  CD) 360 MG 24 hr capsule Take 1 capsule by mouth at bedtime 90 capsule 0   docusate sodium (COLACE) 100 MG capsule Take 100 mg by mouth at bedtime. Stool softner     ferrous sulfate  325 (65 FE) MG tablet Take 1 tablet (325 mg total) by mouth daily with breakfast.     levothyroxine  (SYNTHROID ) 100 MCG tablet Take 1 tablet by mouth once daily 90 tablet 0   loratadine (CLARITIN) 10 MG tablet Take 10 mg by mouth daily as needed for allergies.     metoprolol  succinate (TOPROL -XL) 25 MG 24 hr tablet Take 1 tablet by mouth once daily 90 tablet 0   omeprazole  (PRILOSEC) 40 MG capsule Take 1 capsule by mouth once daily 90 capsule 2   telmisartan  (MICARDIS ) 40 MG tablet Take 1 tablet by mouth once daily 90 tablet 0   triamterene -hydrochlorothiazide  (MAXZIDE -25) 37.5-25 MG tablet Take 1 tablet by mouth once daily 90 tablet 3   vitamin B-12 (CYANOCOBALAMIN ) 1000 MCG tablet Take 1,000 mcg by mouth daily.     warfarin (COUMADIN ) 5 MG tablet TAKE 1 TABLET BY MOUTH DAILY EXCEPT TAKE 1/2 TABLET ON MONDAYS AND THURSDAYS OR AS DIRECTED BY ANTICOAGULATION CLINIC 105 tablet 3   Current Facility-Administered Medications  Medication Dose Route Frequency Provider Last Rate Last Admin   phenazopyridine  (PYRIDIUM )  tablet 100 mg  100 mg Oral TID WC Jesus Bernardino MATSU, MD        Allergies-reviewed and updated Allergies  Allergen Reactions   Iohexol  Anaphylaxis, Hives and Shortness Of Breath    Incident 1990, premedicated prior to obtaining IV contrast since.   Amoxicillin-Pot Clavulanate Swelling and Other (See Comments)    Facial swelling Did it involve swelling of the face/tongue/throat, SOB, or low BP? Yes Did it involve sudden or severe rash/hives, skin peeling, or any reaction on the inside of your mouth or nose? No Did you need to seek medical attention at a hospital or doctor's office? No When did it last happen?~7-8 years ago   If all above answers are "NO", may proceed with cephalosporin use.    Other     Contrast dye    Social History   Social History Narrative   Married 1971. 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    Objective  Objective:  BP 104/70   Pulse 97   Temp (!) 97.3 F (36.3 C)   Ht 5' 4.5 (1.638 m)   LMP  (LMP Unknown)   SpO2 98%   BMI 33.46 kg/m  Gen: NAD, resting comfortably HEENT: Mucous membranes are moist. Oropharynx normal Neck: no thyromegaly CV: irregularly irregular  no murmurs rubs or gallops Lungs: CTAB no crackles, wheeze, rhonchi Abdomen: soft/nontender/nondistended/normal bowel sounds. No rebound or guarding.  Ext: trace edema Skin: warm, dry Neuro: grossly normal, moves all extremities, PERRLA  Diabetic foot exam was performed with the following findings:   Normal sensation of 10g monofilament Intact posterior tibialis and dorsalis pedis pulses Bunion on left foot in particular with hammer toe- wears spacers and helpful        Assessment and Plan   68 y.o. female presenting for annual physical.  Health Maintenance counseling: 1. Anticipatory  guidance: Patient counseled regarding regular dental exams -q6 months, eye exams - yearly,  avoiding smoking and second hand smoke, limiting alcohol to 1 beverage per day- doesn't drink , no illicit drugs .   2. Risk factor reduction:  Advised patient of need for regular exercise and diet rich and fruits and vegetables to reduce risk of heart attack and stroke.  Exercise- recumbent bike daily 1-2 hours. Feels better with this Diet/weight management-weight roughly stable. Feels could improve her food intake- slipped on sweets lately- holidays and birthday Wt Readings from Last 3 Encounters:  02/29/24 198 lb (89.8 kg)  01/20/24 198 lb 12.8 oz (90.2 kg)  09/30/23 195 lb 9.6 oz (88.7 kg)   3. Immunizations/screenings/ancillary studies- up to date - COVID shot planned in sept Immunization History  Administered Date(s) Administered   Influenza Split 06/30/2011, 06/29/2012, 06/08/2022   Influenza Whole 07/26/2007, 06/15/2008, 07/25/2009   Influenza, High Dose Seasonal PF 06/23/2019   Influenza,inj,Quad PF,6+ Mos 07/10/2013, 06/13/2014, 06/14/2015, 05/14/2016, 05/27/2017, 07/19/2018   Influenza-Unspecified 05/23/2020, 06/17/2021, 05/23/2023   PFIZER Comirnaty(Gray Top)Covid-19 Tri-Sucrose Vaccine 04/23/2021   PFIZER(Purple Top)SARS-COV-2 Vaccination 10/14/2019, 11/04/2019, 06/28/2020   PNEUMOCOCCAL CONJUGATE-20 03/17/2022   Pfizer Covid-19 Vaccine Bivalent Booster 18yrs & up 07/04/2021, 06/22/2022   Pfizer Covid-19 Vaccine Bivalent Booster 5y-11y 05/23/2023   Pfizer(Comirnaty)Fall Seasonal Vaccine 12 years and older 05/23/2023   Pneumococcal Polysaccharide-23 07/26/2014, 02/14/2020   Respiratory Syncytial Virus Vaccine,Recomb Aduvanted(Arexvy)  09/02/2022   Td 09/22/2003   Tdap 03/18/2018   Zoster Recombinant(Shingrix) 03/03/2022, 06/08/2022    4. Cervical cancer screening- still sees Dr. Gorge annually but past age based screening recommendations  5. Breast cancer screening-  breast exam with  gyn and mammogram 10/19/23 6. Colon cancer screening - 06/12/21 with 3 year repeat to be lined up with endoscopy 7. Skin cancer screening- DrRONITA whitworth yearly. advised regular sunscreen use. Denies worrisome, changing, or new skin lesions.  8. Birth control/STD check- not dating 35. Osteoporosis screening at 65- June 2021 mild osteopenia 10. 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 Wilhelmenia . follow chromogranin levels and PET scans. EGD yearly now- last sept 2024 as long as chromogranin stable -sees Dr. Avram in coming months. Has had good report from oncology in may  # primary hypercoagulable state with history of multiple DVT and PE #acquired thrombophilia on coumadin  S: patient remains on  Coumadin  long term -requires lovenox  bridge for procedures.  A/P: doing well lately- continue current medications    # Atrial fibrillation S: Rate controlled with  diltiazem  360mg  ER and metoprolol  25 mg ER Anticoagulated with  warfarin. Also requires lovenox  bridge for procedures A/P: appropriately anticoagulated and rate controlled- continue current medicine    # Iron  deficiency anemia- iron  levels have been low- currently taking  daily. Also on high dose PPI through GI.   #hypertension S: medication:  diltiazem  360mg  XR, metoprolol  25 mg XR, telmisartan  40mg , triamterene  hctz 37.5-25 mg A/P: well controlled and not lightheaded- continue current medications   #hyperlipidemia- she prefers to stay on lowest dose possible S: Medication: lovastatin  10 mg once a week- missing lately as was getting myalgias Lab Results  Component Value Date   CHOL 179 03/19/2023   HDL 52.30 03/19/2023   LDLCALC 95 03/19/2023   LDLDIRECT 39.0 03/10/2019   TRIG 159.0 (H) 03/19/2023   CHOLHDL 3  03/19/2023   A/P: lipids may be higher off medications- update today- but had myalgias so may stay off  # Diabetes S: Medication: diet controlled Lab Results  Component Value Date   HGBA1C 6.5 09/30/2023   HGBA1C 6.3 03/19/2023   HGBA1C 6.1 09/09/2022  A/P: hopefully stable- update a1c today. Continue without meds for now   #hypothyroidism S: compliant On thyroid  medication- levothyroxine  100 mcg  Lab Results  Component Value Date   TSH 1.66 09/30/2023  A/P:hopefully stable- update tsh today. Continue current meds for now    # B12 deficiency S: history of neuropathy with this. Metformin  likely contributed. Doing well now on oral medication after time on injections.   Lab Results  Component Value Date   VITAMINB12 1,442 (H) 09/30/2023  A/P: hopefully stable- update b12 today. Continue current meds for now    #Vitamin D  deficiency S: Medication: 2000 units daily Last vitamin D  Lab Results  Component Value Date   VD25OH 49.82 03/19/2023  A/P:  hopefully stable- update vitamin D   today. Continue current meds for now    Recommended follow up: Return in about 6 months (around 09/30/2024) for followup or sooner if needed.Schedule b4 you leave. Future Appointments  Date Time Provider Department Center  04/19/2024 10:00 AM [facility] COUMADIN  CLINIC [facility] PEC  05/18/2024 10:20 AM Arletta Camie FORBES DEVONNA [facility]  08/21/2024 10:00 AM [facility] PHLEBOTOMIST  [facility] None  08/29/2024  9:00 AM Cloretta Arley NOVAK, MD [facility] None  03/05/2025 10:00 AM [facility] ANNUAL WELLNESS VISIT 1 [facility]   Lab/Order associations:NOT fasting   ICD-10-CM   1. Preventative health care  Z00.00     2. Osteopenia of left hip  M85.852 DG Bone Density    3. Statin myopathy  G72.0    T46.6X5A     4. Drug-induced myopathy  G72.0     5. Low vitamin B12 level  R79.89 Vitamin B12    6. Primary hypercoagulable state (HCC)  D68.59     7. Type 2 diabetes mellitus with obesity (HCC)  E11.69  Hemoglobin A1c   E66.9 Urine Microalbumin w/creat. ratio    Comprehensive metabolic panel with GFR    CBC with Differential/Platelet    Lipid panel    8. Vitamin D  deficiency  E55.9 VITAMIN D  25 Hydroxy (Vit-D Deficiency, Fractures)    9. Iron  deficiency anemia, unspecified iron  deficiency anemia type  D50.9 IBC + Ferritin      No orders of the defined types were placed in this encounter.   Return precautions advised.  Garnette Lukes, MD

## 2024-03-30 NOTE — Patient Instructions (Addendum)
 Schedule your bone density test at check out desk.  - located [address] across the street from [place] - in the basement - you DO NEED an appointment for the bone density tests.   Please stop by lab before you go If you have mychart- we will send your results within 3 business days of us  receiving them.  If you do not have mychart- we will call you about results within 5 business days of us  receiving them.  *please also note that you will see labs on mychart as soon as they post. I will later go in and write notes on them- will say notes from Dr. Katrinka   Recommended follow up: Return in about 6 months (around 09/30/2024) for followup or sooner if needed.Schedule b4 you leave.

## 2024-03-31 ENCOUNTER — Ambulatory Visit (INDEPENDENT_AMBULATORY_CARE_PROVIDER_SITE_OTHER)
Admission: RE | Admit: 2024-03-31 | Discharge: 2024-03-31 | Disposition: A | Discharge status: Home / Self Care | Source: Ambulatory Visit | Attending: Family Medicine | Admitting: Family Medicine

## 2024-03-31 DIAGNOSIS — M85852 Other specified disorders of bone density and structure, left thigh: Secondary | ICD-10-CM

## 2024-04-08 ENCOUNTER — Other Ambulatory Visit: Payer: Self-pay | Admitting: Family Medicine

## 2024-04-19 ENCOUNTER — Ambulatory Visit (INDEPENDENT_AMBULATORY_CARE_PROVIDER_SITE_OTHER)

## 2024-04-19 ENCOUNTER — Telehealth: Payer: Self-pay

## 2024-04-19 DIAGNOSIS — Z7901 Long term (current) use of anticoagulants: Secondary | ICD-10-CM

## 2024-04-19 LAB — POCT INR: INR: 2.9 (ref 2.0–3.0)

## 2024-04-19 NOTE — Telephone Encounter (Signed)
 Noted

## 2024-04-19 NOTE — Telephone Encounter (Signed)
 Pt in coumadin  clinic today and brought up medication refills. She reports she has requested in the past and her pharmacy has requested in the past for refills to be added to the prescription to cover 1 year. It appears refills are only being sent 90 days at a time with no refills. She reports this is difficult for her and her pharmacy to keep up with contacting the clinic for refill requests.   Advised pt a msg will be sent to PCP's medical assistance to let her know of her request. Pt appreciative and verbalized understanding.

## 2024-04-19 NOTE — Patient Instructions (Addendum)
 Pre visit review using our clinic review tool, if applicable. No additional management support is needed unless otherwise documented below in the visit note.  Continue 1/2 tablet daily except take 1 tablet on Tuesday, Thursday, Saturday. Recheck in 6 weeks.

## 2024-04-19 NOTE — Progress Notes (Signed)
 I have reviewed the patient's encounter and agree with the documentation.  Worth HERO. Kennyth, MD 04/19/2024 10:21 AM

## 2024-04-19 NOTE — Progress Notes (Signed)
 Pt reports she will have a repeat colonoscopy/EGD this fall. She will let the coumadin  clinic know when this is scheduled.  Continue 1/2 tablet daily except take 1 tablet on Tuesday, Thursday, Saturday. Recheck in 6 weeks.

## 2024-04-25 DIAGNOSIS — E119 Type 2 diabetes mellitus without complications: Secondary | ICD-10-CM | POA: Diagnosis not present

## 2024-04-25 LAB — HM DIABETES EYE EXAM

## 2024-05-11 ENCOUNTER — Other Ambulatory Visit: Payer: Self-pay | Admitting: Family Medicine

## 2024-05-18 ENCOUNTER — Ambulatory Visit: Admitting: Gastroenterology

## 2024-05-18 ENCOUNTER — Telehealth: Payer: Self-pay | Admitting: *Deleted

## 2024-05-18 ENCOUNTER — Encounter: Payer: Self-pay | Admitting: Gastroenterology

## 2024-05-18 VITALS — BP 114/76 | HR 93 | Ht 64.0 in | Wt 198.4 lb

## 2024-05-18 DIAGNOSIS — Z85028 Personal history of other malignant neoplasm of stomach: Secondary | ICD-10-CM | POA: Diagnosis not present

## 2024-05-18 DIAGNOSIS — K219 Gastro-esophageal reflux disease without esophagitis: Secondary | ICD-10-CM | POA: Diagnosis not present

## 2024-05-18 DIAGNOSIS — C7A8 Other malignant neuroendocrine tumors: Secondary | ICD-10-CM

## 2024-05-18 DIAGNOSIS — Z860101 Personal history of adenomatous and serrated colon polyps: Secondary | ICD-10-CM

## 2024-05-18 DIAGNOSIS — K317 Polyp of stomach and duodenum: Secondary | ICD-10-CM

## 2024-05-18 DIAGNOSIS — Z7901 Long term (current) use of anticoagulants: Secondary | ICD-10-CM | POA: Diagnosis not present

## 2024-05-18 DIAGNOSIS — Z860100 Personal history of colon polyps, unspecified: Secondary | ICD-10-CM

## 2024-05-18 MED ORDER — NA SULFATE-K SULFATE-MG SULF 17.5-3.13-1.6 GM/177ML PO SOLN: 1.0000 | Freq: Once | ORAL | 0 refills | Status: AC

## 2024-05-18 NOTE — Telephone Encounter (Signed)
 May come off Coumadin  5 days prior but will need to be set up with Lovenox  bridge-[place] can you help with this?  Greatly appreciate all you do

## 2024-05-18 NOTE — Progress Notes (Unsigned)
 RICHELE STRAND [id number] 06/11/1956   Chief Complaint: History of stomach cancer  Referring Provider: Katrinka Garnette KIDD, MD Primary GI MD: Dr. Avram  HPI: IVRY PIGUE is a 68 y.o. female with past medical history of asthma, CKD, depression/anxiety, diabetes, DVT, PE, GERD, HTN, hypothyroidism, IDA, lupus anticoagulant with hypercoagulable state, paroxysmal A-fib, primary malignant neuroendocrine tumor of the stomach, cholecystectomy, hysterectomy who presents today to discuss EGD/colonoscopy.    GI history: Upper endoscopy 05/05/2019 with resection of multiple gastric carcinoid tumors EUS 07/12/2019-resection of multiple gastric carcinoid tumors, positive resection margin at the gastric cardia polypectomy site, tubular adenomas and hyperplastic polyps removed from the lesser curvature Elevated chromogranin A level CT abdomen/pelvis 05/18/2019-no evidence of a gastric mass or metastatic disease Netspot  08/11/2019-focal activity corresponding to a 2-3 mm lesion in the mid pancreas, diffuse activity in the stomach EGD 10/09/2019- 3 areas of scar/clip/polypoid tissue removed with negative pathology EUS 10/09/2019-4 mm pancreas body lesion, FNA biopsy negative, no adenopathy EUS 06/19/2020-no evidence of recurrent tumor in areas of gastric body scars, nodularity was noted underneath 2 endoclips in the greater curvature, this area was biopsied and polyps were removed from the middle gastric body and proximal gastric antrum, biopsies from erosions and erythema were taken at the greater curvature, lesser curvature, and antrum.  An irregular lesion was identified in the pancreas body measuring 3.4 x 4.6 mm, the pancreas lesion was biopsied.  The pathology from the pancreas FNA was negative, the pathology from multiple biopsies including the gastric body scar, antrum polyp, greater curvature biopsy, lesser curvature biopsy, and gastric cardia biopsy revealed low-grade neuroendocrine tumor Netspot   04/14/2021-no residual avid tumor in the stomach, no evidence of tracer avid metastatic disease, decrease in tracer activity associated with the pancreas lesion Upper endoscopy 06/12/2021-5 gastric polyps resected and retrieved-hyperplastic polyp with well differentiated neuroendocrine tumor, stomach cardia and fundus biopsy-reactive gastropathy with neuroendocrine dysplasia, lesser curvature biopsy-reactive gastropathy with neuroendocrine hyperplasia Upper endoscopy 05/28/2022 -  Three gastric polyps. Resected and retrieved. EMR Clips ( MR conditional) were placed. - Two gastric polyps 1- 2 mm. Resected and retrieved. Cold biopsy - Chronic gastritis. Biopsied. Lesser curve, greater curve, incisura and cardia/ funud mapping biopsies given prior intestinal metaplasia - Multiple persistent clips from prior polypectomies - The examination was otherwise normal. Upper endoscopy 06/15/2023 - A few gastric polyps - Chronic gastritis. Biopsied. - The examination was otherwise normal.   Patient last seen in office by Dr. Avram 04/17/2022.  Noted to have history of neuroendocrine tumor of the stomach, multiple gastric polyps, history of adenomatous colon polyps, and chronic anticoagulation on warfarin.  History of iron  deficiency anemia.  On oral iron  supplement.  Labs 03/30/2024: Normal CBC, stable renal function, high vitamin B12, normal vitamin D , iron  74, ferritin 29.7   Patient states she frequently has loose stools after eating.  Can have some abdominal cramping which begins in her epigastrium and radiates down to her LLQ and improves with a bowel movement.  States symptoms are about the same as they were last year, though maybe with some increased frequency of bowel movements.  Has been told in the past to expect this based on her history.  She does have identifiable food triggers such as [place] food, which seems to go straight through her.  Dairy is also a trigger.  She has some occasional abdominal  bloating after eating.  Denies any unintentional weight loss, states her weight is stable.  Denies any vomiting, fever, chills.  Occasionally may have  some mild nausea in the morning.  Denies any acid reflux or heartburn.  Occasionally sees a small amount of blood on the toilet paper with wiping and which she attributes to hemorrhoids.  Denies rectal pain with bowel movements.  Denies chest pain or shortness of breath.  Due for EGD and colonoscopy and requests to have this done before she sees Dr. Cloretta again on December 9.  Previous GI Procedures/Imaging   EGD 06/15/2023 - A few gastric polyps  - Chronic gastritis. Biopsied.  - The examination was otherwise normal. Path: A. GASTRIC, ANTRUM, BIOPSY:  - Gastric antral mucosa with mild chronic gastritis and a very focal  intestinal metaplasia  - Negative for dysplasia  - Helicobacter pylori-like organisms are not identified on routine HE  stain   B. GASTRIC, BODY, BIOPSY:  - Gastric antral-type mucosa showing chronic gastritis, and atrophy with  linear and micronodular ECL cell hyperplasia, consistent with atrophic  autoimmune gastritis  - Helicobacter pylori-like organisms are not identified on routine HE  stain   C. GASTRIC, DISTAL BODY, POLYPECTOMY:  - Gastric hyperplastic polyp with very focal intestinal metaplasia,  negative for dysplasia   Colonoscopy 06/12/2021 - Hemorrhoids found on digital rectal exam.  - The examined portion of the ileum was normal.  - Three 3 to 6 mm polyps in the ascending colon and in the cecum, removed with a cold snare. Resected and retrieved.  - One 15 mm polyp in the descending colon, removed using lift and cut and a cold snare. Resected and retrieved. Clips ( MR conditional) were placed.  - Normal mucosa in the entire examined colon otherwise.  - Non- bleeding non- thrombosed external and internal hemorrhoids. - Recall 3 years Path: G. COLON, POLYPECTOMY:  - Tubular adenoma (x2 fragments).   - No high grade dysplasia or malignancy.   H. COLON, DESCENDING, POLYPECTOMY:  - Hyperplastic polyp.  - No dysplasia or malignancy.     Past Medical History:  Diagnosis Date   Allergy    Anxiety    Arthritis    Asthma    many years ago   Blood transfusion without reported diagnosis    Cataract    Chronic kidney disease    Clotting disorder (HCC)    Depression    Husband died 06-06-2019   Diabetes (HCC)    not on medication   DVT (deep venous thrombosis) (HCC)    left arm   Dysrhythmia    Family history of bone cancer    Family history of breast cancer    Family history of lung cancer    Family history of ovarian cancer    Family history of testicular cancer    Family history of uterine cancer    GERD (gastroesophageal reflux disease)    Headache    Hypertension    Hypothyroidism    Iron  deficiency anemia 03/30/2019   Irregular heart beat    tachycardia   Lupus anticoagulant with hypercoagulable state (HCC)    Melanoma (HCC)    2005- face    Paroxysmal atrial fibrillation (HCC)    Plantar fasciitis    Pneumonia 05-Jun-2018   Primary hypercoagulable state (HCC)    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   Pulmonary embolism (HCC)    Thyroid  disease    hypothyroidism    Past Surgical History:  Procedure Laterality Date   ABDOMINAL HYSTERECTOMY     noncancerous   BACK SURGERY  1983   BIOPSY  06/19/2020  Procedure: BIOPSY;  Surgeon: Wilhelmenia Aloha Raddle., MD;  Location: [facility];  Service: Gastroenterology;;   BIOPSY  06/12/2021   Procedure: BIOPSY;  Surgeon: Wilhelmenia Aloha Raddle., MD;  Location: [facility];  Service: Gastroenterology;;   BIOPSY  05/28/2022   Procedure: BIOPSY;  Surgeon: Avram Lupita BRAVO, MD;  Location: [facility];  Service: Gastroenterology;;   BIOPSY  06/15/2023   Procedure: BIOPSY;  Surgeon: Avram Lupita BRAVO, MD;  Location: [facility];  Service: Gastroenterology;;   CHOLECYSTECTOMY      COLONOSCOPY     COLONOSCOPY WITH PROPOFOL  N/A 06/12/2021   Procedure: COLONOSCOPY WITH PROPOFOL ;  Surgeon: Wilhelmenia Aloha Raddle., MD;  Location: [facility] ENDOSCOPY;  Service: Gastroenterology;  Laterality: N/A;   ENDOSCOPIC MUCOSAL RESECTION N/A 07/12/2019   Procedure: ENDOSCOPIC MUCOSAL RESECTION;  Surgeon: Wilhelmenia Aloha Raddle., MD;  Location: [facility]COPY;  Service: Gastroenterology;  Laterality: N/A;   ENDOSCOPIC MUCOSAL RESECTION  06/19/2020   Procedure: ENDOSCOPIC MUCOSAL RESECTION;  Surgeon: Wilhelmenia Aloha Raddle., MD;  Location: [facility];  Service: Gastroenterology;;   ESOPHAGOGASTRODUODENOSCOPY N/A 06/19/2020   Procedure: ESOPHAGOGASTRODUODENOSCOPY (EGD);  Surgeon: Wilhelmenia Aloha Raddle., MD;  Location: [facility];  Service: Gastroenterology;  Laterality: N/A;   ESOPHAGOGASTRODUODENOSCOPY (EGD) WITH PROPOFOL  N/A 07/12/2019   Procedure: ESOPHAGOGASTRODUODENOSCOPY (EGD) WITH PROPOFOL ;  Surgeon: Wilhelmenia Aloha Raddle., MD;  Location: [facility] ENDOSCOPY;  Service: Gastroenterology;  Laterality: N/A;   ESOPHAGOGASTRODUODENOSCOPY (EGD) WITH PROPOFOL  N/A 10/09/2019   Procedure: ESOPHAGOGASTRODUODENOSCOPY (EGD) WITH PROPOFOL ;  Surgeon: Wilhelmenia Aloha Raddle., MD;  Location: [facility] ENDOSCOPY;  Service: Gastroenterology;  Laterality: N/A;   ESOPHAGOGASTRODUODENOSCOPY (EGD) WITH PROPOFOL  N/A 06/12/2021   Procedure: ESOPHAGOGASTRODUODENOSCOPY (EGD) WITH PROPOFOL ;  Surgeon: Wilhelmenia Aloha Raddle., MD;  Location: [facility] ENDOSCOPY;  Service: Gastroenterology;  Laterality: N/A;   ESOPHAGOGASTRODUODENOSCOPY (EGD) WITH PROPOFOL  N/A 05/28/2022   Procedure: ESOPHAGOGASTRODUODENOSCOPY (EGD) WITH PROPOFOL ;  Surgeon: Avram Lupita BRAVO, MD;  Location: [facility];  Service: Gastroenterology;  Laterality: N/A;   ESOPHAGOGASTRODUODENOSCOPY (EGD) WITH PROPOFOL  N/A 06/15/2023   Procedure: ESOPHAGOGASTRODUODENOSCOPY (EGD) WITH PROPOFOL ;  Surgeon: Avram Lupita BRAVO, MD;  Location: [facility];  Service: Gastroenterology;  Laterality:  N/A;   EUS N/A 07/12/2019   Procedure: UPPER ENDOSCOPIC ULTRASOUND (EUS) RADIAL;  Surgeon: Wilhelmenia Aloha Raddle., MD;  Location: [facility] ENDOSCOPY;  Service: Gastroenterology;  Laterality: N/A;   EUS N/A 10/09/2019   Procedure: UPPER ENDOSCOPIC ULTRASOUND (EUS) RADIAL;  Surgeon: Wilhelmenia Aloha Raddle., MD;  Location: [facility] ENDOSCOPY;  Service: Gastroenterology;  Laterality: N/A;   fatty tumor breast Right    removed   FINE NEEDLE ASPIRATION  06/19/2020   Procedure: FINE NEEDLE ASPIRATION (FNA) LINEAR;  Surgeon: Wilhelmenia Aloha Raddle., MD;  Location: [facility];  Service: Gastroenterology;;   FOREIGN BODY REMOVAL  10/09/2019   Procedure: FOREIGN BODY REMOVAL;  Surgeon: Wilhelmenia Aloha Raddle., MD;  Location: [facility] ENDOSCOPY;  Service: Gastroenterology;;   FOREIGN BODY REMOVAL  06/19/2020   Procedure: FOREIGN BODY REMOVAL;  Surgeon: Wilhelmenia Aloha Raddle., MD;  Location: [facility];  Service: Gastroenterology;;  polyp removal with net    HEMOSTASIS CLIP PLACEMENT  07/12/2019   Procedure: HEMOSTASIS CLIP PLACEMENT;  Surgeon: Wilhelmenia Aloha Raddle., MD;  Location: [facility] ENDOSCOPY;  Service: Gastroenterology;;   HEMOSTASIS CLIP PLACEMENT  10/09/2019   Procedure: HEMOSTASIS CLIP PLACEMENT;  Surgeon: Wilhelmenia Aloha Raddle., MD;  Location: [facility] ENDOSCOPY;  Service: Gastroenterology;;   HEMOSTASIS CLIP PLACEMENT  06/19/2020   Procedure: HEMOSTASIS CLIP PLACEMENT;  Surgeon: Wilhelmenia Aloha Raddle., MD;  Location: [facility];  Service: Gastroenterology;;   HEMOSTASIS CLIP PLACEMENT  06/12/2021   Procedure:  HEMOSTASIS CLIP PLACEMENT;  Surgeon: Mansouraty, Aloha Raddle., MD;  Location: [facility] ENDOSCOPY;  Service: Gastroenterology;;   HEMOSTASIS CLIP PLACEMENT  05/28/2022   Procedure: HEMOSTASIS CLIP PLACEMENT;  Surgeon: Avram Lupita BRAVO, MD;  Location: [facility];  Service: Gastroenterology;;   HEMOSTASIS CLIP PLACEMENT  06/15/2023   Procedure: HEMOSTASIS CLIP PLACEMENT;  Surgeon: Avram Lupita BRAVO, MD;  Location: [facility];  Service: Gastroenterology;;   HEMOSTASIS CONTROL  07/12/2019   Procedure: HEMOSTASIS CONTROL;  Surgeon: Wilhelmenia Aloha Raddle., MD;  Location: [facility] ENDOSCOPY;  Service: Gastroenterology;;   HYSTEROTOMY     KNEE ARTHROSCOPY Left    Lymp nodes Right 08/2004   MELANOMA EXCISION Right 2005   face    OOPHORECTOMY Bilateral    POLYPECTOMY  07/12/2019   Procedure: POLYPECTOMY;  Surgeon: Wilhelmenia Aloha Raddle., MD;  Location: [facility] ENDOSCOPY;  Service: Gastroenterology;;   POLYPECTOMY  10/09/2019   Procedure: POLYPECTOMY;  Surgeon: Wilhelmenia Aloha Raddle., MD;  Location: [facility] ENDOSCOPY;  Service: Gastroenterology;;   POLYPECTOMY  06/19/2020   Procedure: POLYPECTOMY;  Surgeon: Wilhelmenia Aloha Raddle., MD;  Location: [facility] ENDOSCOPY;  Service: Gastroenterology;;   POLYPECTOMY  06/12/2021   Procedure: POLYPECTOMY;  Surgeon: Wilhelmenia Aloha Raddle., MD;  Location: [facility]COPY;  Service: Gastroenterology;;   POLYPECTOMY  05/28/2022   Procedure: POLYPECTOMY;  Surgeon: Avram Lupita BRAVO, MD;  Location: [facility] ENDOSCOPY;  Service: Gastroenterology;;   POLYPECTOMY  06/15/2023   Procedure: POLYPECTOMY;  Surgeon: Avram Lupita BRAVO, MD;  Location: [facility] ENDOSCOPY;  Service: Gastroenterology;;   removal lymphnodes in right neck     SUBMUCOSAL LIFTING INJECTION  07/12/2019   Procedure: SUBMUCOSAL LIFTING INJECTION;  Surgeon: Wilhelmenia Aloha Raddle., MD;  Location: [facility] ENDOSCOPY;  Service: Gastroenterology;;   SUBMUCOSAL LIFTING INJECTION  10/09/2019   Procedure: SUBMUCOSAL LIFTING INJECTION;  Surgeon: Wilhelmenia Aloha Raddle., MD;  Location: [facility] ENDOSCOPY;  Service: Gastroenterology;;   SUBMUCOSAL LIFTING INJECTION  06/12/2021   Procedure: SUBMUCOSAL LIFTING INJECTION;  Surgeon: Wilhelmenia Aloha Raddle., MD;  Location: [facility] ENDOSCOPY;  Service: Gastroenterology;;   SUBMUCOSAL LIFTING INJECTION  05/28/2022   Procedure: SUBMUCOSAL LIFTING INJECTION;  Surgeon: Avram Lupita BRAVO, MD;  Location: [facility];  Service: Gastroenterology;;    UPPER ESOPHAGEAL ENDOSCOPIC ULTRASOUND (EUS) N/A 06/19/2020   Procedure: UPPER ESOPHAGEAL ENDOSCOPIC ULTRASOUND (EUS);  Surgeon: Wilhelmenia Aloha Raddle., MD;  Location: [facility];  Service: Gastroenterology;  Laterality: N/A;   UPPER GASTROINTESTINAL ENDOSCOPY      Current Outpatient Medications  Medication Sig Dispense Refill   acetaminophen  (TYLENOL ) 500 MG tablet Take 500 mg by mouth every 6 (six) hours as needed (for pain.).      Alum Hydroxide-Mag Trisilicate (GAVISCON) 80-14.2 MG CHEW Chew 1-2 tablets by mouth 2 (two) times daily as needed (indigestion).     Cholecalciferol (VITAMIN D3) 50 MCG (2000 UT) TABS Take 2,000 Units by mouth daily.     diltiazem  (CARDIZEM  CD) 360 MG 24 hr capsule Take 1 capsule by mouth at bedtime 90 capsule 0   docusate sodium (COLACE) 100 MG capsule Take 100 mg by mouth at bedtime. Stool softner     ferrous sulfate  325 (65 FE) MG tablet Take 1 tablet (325 mg total) by mouth daily with breakfast.     levothyroxine  (SYNTHROID ) 100 MCG tablet Take 1 tablet by mouth once daily 90 tablet 0   loratadine (CLARITIN) 10 MG tablet Take 10 mg by mouth daily as needed for allergies.     metoprolol  succinate (TOPROL -XL) 25 MG 24 hr tablet Take 1 tablet by mouth once daily  90 tablet 0   omeprazole  (PRILOSEC) 40 MG capsule Take 1 capsule by mouth once daily 90 capsule 2   telmisartan  (MICARDIS ) 40 MG tablet Take 1 tablet by mouth once daily 90 tablet 0   triamterene -hydrochlorothiazide  (MAXZIDE -25) 37.5-25 MG tablet Take 1 tablet by mouth once daily 90 tablet 3   vitamin B-12 (CYANOCOBALAMIN ) 1000 MCG tablet Take 1,000 mcg by mouth daily.     warfarin (COUMADIN ) 5 MG tablet TAKE 1 TABLET BY MOUTH DAILY EXCEPT TAKE 1/2 TABLET ON MONDAYS AND THURSDAYS OR AS DIRECTED BY ANTICOAGULATION CLINIC 105 tablet 3   Current Facility-Administered Medications  Medication Dose Route Frequency Provider Last Rate Last Admin   phenazopyridine  (PYRIDIUM ) tablet 100 mg  100 mg Oral TID WC  Jesus Bernardino MATSU, MD        Allergies as of 05/18/2024 - Review Complete 05/18/2024  Allergen Reaction Noted   Iohexol  Anaphylaxis, Hives, and Shortness Of Breath 09/24/2012   Amoxicillin-pot clavulanate Swelling and Other (See Comments) 03/09/2007   Other  05/29/2014    Family History  Problem Relation Age of Onset   Coronary artery disease Mother        Mom 77, Dad 57-smoker   Diabetes Mother    Ovarian cancer Mother 66       not officially diagnosed   Hypertension Father    Diabetes Father    Mitral valve prolapse Sister    Lung cancer Sister    Diabetes Sister    Diabetes Sister    Diabetes Sister    Atrial fibrillation Sister    Breast cancer Maternal Aunt 29   Alzheimer's disease Maternal Aunt    Breast cancer Half-Sister 17   Breast cancer Half-Sister 8   Lung cancer Nephew    Bone cancer Nephew 64   Uterine cancer Niece 25   Breast cancer Niece        diagnosed mid-60s   Cancer Niece 92       unknown type   Breast cancer Other        maternal great-aunt, diagnosed in her 18s   Breast cancer Other        4th degree maternal relative, diagnosed in her 78s   Colon cancer Neg Hx    Esophageal cancer Neg Hx    Pancreatic cancer Neg Hx    Rectal cancer Neg Hx    Stomach cancer Neg Hx    Inflammatory bowel disease Neg Hx    Liver disease Neg Hx    Colon polyps Neg Hx     Social History   Tobacco Use   Smoking status: Never   Smokeless tobacco: Never  Vaping Use   Vaping status: Never Used  Substance Use Topics   Alcohol use: Never    Comment: no    Drug use: Never     Review of Systems:    Constitutional: No unintentional weight loss, fever, chills Cardiovascular: No chest pain Respiratory: No SOB  Gastrointestinal: See HPI and otherwise negative Hematologic: No bleeding or bruising    Physical Exam:  Vital signs: BP 114/76   Pulse 93   Ht 5' 4 (1.626 m)   Wt 198 lb 6 oz (90 kg)   LMP  (LMP Unknown)   BMI 34.05 kg/m   Constitutional:  Pleasant, overweight female in NAD, alert and cooperative Head:  Normocephalic and atraumatic.  Eyes: No scleral icterus.  Respiratory: Respirations even and unlabored. Lungs clear to auscultation bilaterally.  No wheezes, crackles, or rhonchi.  Cardiovascular:  Regular rate and rhythm. No murmurs. No peripheral edema. Gastrointestinal:  Soft, nondistended, nontender. No rebound or guarding. Normal bowel sounds. No appreciable masses or hepatomegaly. Rectal:  Deferred to colonoscopy Neurologic:  Alert and oriented x4;  grossly normal neurologically.  Skin:   Dry and intact without significant lesions or rashes. Psychiatric: Oriented to person, place and time. Demonstrates good judgement and reason without abnormal affect or behaviors.   RELEVANT LABS AND IMAGING: CBC    Component Value Date/Time   WBC 6.6 03/30/2024 1153   RBC 4.49 03/30/2024 1153   HGB 13.7 03/30/2024 1153   HGB 13.8 07/10/2021 1031   HCT 40.6 03/30/2024 1153   PLT 192.0 03/30/2024 1153   PLT 177 07/10/2021 1031   MCV 90.4 03/30/2024 1153   MCH 30.3 07/10/2021 1031   MCHC 33.9 03/30/2024 1153   RDW 12.8 03/30/2024 1153   LYMPHSABS 1.7 03/30/2024 1153   MONOABS 0.5 03/30/2024 1153   EOSABS 0.4 03/30/2024 1153   BASOSABS 0.1 03/30/2024 1153    CMP     Component Value Date/Time   NA 139 03/30/2024 1153   NA 140 12/28/2014 0000   K 3.8 03/30/2024 1153   CL 102 03/30/2024 1153   CO2 30 03/30/2024 1153   GLUCOSE 107 (H) 03/30/2024 1153   BUN 15 03/30/2024 1153   BUN 14 12/28/2014 0000   CREATININE 1.23 (H) 03/30/2024 1153   CREATININE 1.16 (H) 08/19/2020 1201   CALCIUM 9.4 03/30/2024 1153   PROT 7.1 03/30/2024 1153   ALBUMIN  4.7 03/30/2024 1153   AST 17 03/30/2024 1153   ALT 18 03/30/2024 1153   ALKPHOS 63 03/30/2024 1153   BILITOT 0.8 03/30/2024 1153   GFRNONAA 49 (L) 08/19/2020 1201   GFRAA 57 (L) 08/19/2020 1201     Assessment/Plan:   History of primary neuroendocrine tumor of the  stomach History of multiple gastric polyps GERD History of adenomatous colon polyps Chronic anticoagulation Patient seen today to discuss scheduling EGD/colonoscopy.  History of primary neuroendocrine tumor of the stomach which is followed by oncology.  Last EGD done 06/15/2023 with finding of a few gastric polyps, chronic gastritis.  Due for repeat EGD.  Also due for repeat colonoscopy based on history of adenomatous colon polyps. She endorses frequent loose stools after eating which is ongoing and stable since she was seen last year.  Has some occasional BRBPR which she attributes to hemorrhoids.  Otherwise doing well and denies other concerns today. She is on warfarin and we will request hold with Lovenox  bridge as has previously been managed by her PCP.  - Schedule EGD/Colonoscopy. I thoroughly discussed the procedure with the patient to include nature of the procedure, alternatives, benefits, and risks (including but not limited to bleeding, infection, perforation, anesthesia/cardiac/pulmonary complications). Patient verbalized understanding and gave verbal consent to proceed with procedure.  - Will request assistance from PCP for 5-day warfarin hold with Lovenox  bridge   Camie Furbish, PA-C [facility] 05/18/2024, 10:17 AM  Patient Care Team: Katrinka Garnette KIDD, MD as PCP - General (Family Medicine) Avram Lupita BRAVO, MD as Consulting Physician (Gastroenterology) Mansouraty, Aloha Raddle., MD as Consulting Physician (Gastroenterology) Cloretta Arley NOVAK, MD as Consulting Physician (Oncology) Lynnell Nottingham, MD as Consulting Physician (Dermatology) Pandora Cadet, [facility] as Pharmacist (Pharmacist)

## 2024-05-18 NOTE — Patient Instructions (Signed)
 You have been scheduled for an endoscopy and colonoscopy. Please follow the written instructions given to you at your visit today.  If you use inhalers (even only as needed), please bring them with you on the day of your procedure.  DO NOT TAKE 7 DAYS PRIOR TO TEST- Trulicity (dulaglutide) Ozempic, Wegovy (semaglutide) Mounjaro (tirzepatide) Bydureon Bcise (exanatide extended release)  DO NOT TAKE 1 DAY PRIOR TO YOUR TEST Rybelsus (semaglutide) Adlyxin (lixisenatide) Victoza (liraglutide) Byetta (exanatide) ___________________________________________________________________________   We have given you separate instructions for your Colonoscopy and Endoscopy scheduled at [facility] on 07/13/2024 at 10 am   You will be contacted by our office prior to your procedure for directions on holding your WARFARIN .  If you do not hear from our office 1 week prior to your scheduled procedure, please call [phone] to discuss.  Follow up per recommendations after procedure  Due to recent changes in healthcare laws, you may see the results of your imaging and laboratory studies on MyChart before your provider has had a chance to review them.  We understand that in some cases there may be results that are confusing or concerning to you. Not all laboratory results come back in the same time frame and the provider may be waiting for multiple results in order to interpret others.  Please give us  48 hours in order for your provider to thoroughly review all the results before contacting the office for clarification of your results.    _______________________________________________________  If your blood pressure at your visit was 140/90 or greater, please contact your primary care physician to follow up on this.  _______________________________________________________  If you are age 70 or older, your body mass index should be between 23-30. Your Body mass index is 34.05 kg/m. If this is  out of the aforementioned range listed, please consider follow up with your Primary Care Provider.  If you are age 16 or younger, your body mass index should be between 19-25. Your Body mass index is 34.05 kg/m. If this is out of the aformentioned range listed, please consider follow up with your Primary Care Provider.   ________________________________________________________  The [facility] providers would like to encourage you to use MYCHART to communicate with providers for non-urgent requests or questions.  Due to long hold times on the telephone, sending your provider a message by [facility] may be a faster and more efficient way to get a response.  Please allow 48 business hours for a response.  Please remember that this is for non-urgent requests.  _______________________________________________________  [facility] is using a team-based approach to care.  Your team is made up of your doctor and two to three APPS. Our APPS (Nurse Practitioners and Physician Assistants) work with your physician to ensure care continuity for you. They are fully qualified to address your health concerns and develop a treatment plan. They communicate directly with your gastroenterologist to care for you. Seeing the Advanced Practice Practitioners on your physician's team can help you by facilitating care more promptly, often allowing for earlier appointments, access to diagnostic testing, procedures, and other specialty referrals.    I appreciate the  opportunity to care for you  Thank You   Camie Heinz,PA-C

## 2024-05-18 NOTE — Telephone Encounter (Signed)
  Erin Kirk 02/28/1956 [id number]  05/18/2024   Dear Dr Katrinka:  We have scheduled the above named patient for a(n) Coumadin  procedure. Our records show that (s)he is on anticoagulation therapy. ( May also need a Lovenox  Bridge)   Please advise as to whether the patient may come off their therapy of Coumadin   5  days prior to their procedure which is scheduled for Colonoscopy/Endoscopy.  Please route your response to Grayce Bettie LATHER or fax response to [phone].  Sincerely,    [facility]

## 2024-05-19 ENCOUNTER — Encounter: Payer: Self-pay | Admitting: Gastroenterology

## 2024-05-19 NOTE — Telephone Encounter (Signed)
 Discussed with patient about her coumadin  and holding it for 5 days and Erin Kirk will contact her about her bridging . Patient knows who Erin Kirk is from the Coumadin  clinic

## 2024-05-23 ENCOUNTER — Other Ambulatory Visit: Payer: Self-pay | Admitting: Family Medicine

## 2024-05-31 ENCOUNTER — Ambulatory Visit (INDEPENDENT_AMBULATORY_CARE_PROVIDER_SITE_OTHER)

## 2024-05-31 DIAGNOSIS — Z7901 Long term (current) use of anticoagulants: Secondary | ICD-10-CM | POA: Diagnosis not present

## 2024-05-31 LAB — POCT INR: INR: 2.7 (ref 2.0–3.0)

## 2024-05-31 NOTE — Telephone Encounter (Signed)
 Pt was in for coumadin  clinic today and reported surgery on 10/23. Pt is aware she will need a Lovenox  bridge.  Advised she will receive instructions at her next coumadin  clinic apt on 10/15. Advised if anything changed to contact coumadin  clinic. Pt verbalized understanding.

## 2024-05-31 NOTE — Patient Instructions (Addendum)
 Pre visit review using our clinic review tool, if applicable. No additional management support is needed unless otherwise documented below in the visit note.  Continue 1/2 tablet daily except take 1 tablet on Tuesday, Thursday, Saturday. Recheck in 5 weeks.

## 2024-05-31 NOTE — Progress Notes (Signed)
 I have reviewed and agree with note, evaluation, plan.   Garnette Lukes, MD

## 2024-05-31 NOTE — Progress Notes (Signed)
 Pt reports she will have a repeat colonoscopy/EGD on 10/23. Pt will need a Lovenox  bridge. Continue 1/2 tablet daily except take 1 tablet on Tuesday, Thursday, Saturday. Recheck in 5 weeks.

## 2024-06-01 ENCOUNTER — Other Ambulatory Visit: Payer: Self-pay | Admitting: Family Medicine

## 2024-06-01 ENCOUNTER — Other Ambulatory Visit: Payer: Self-pay | Admitting: Internal Medicine

## 2024-06-06 NOTE — Telephone Encounter (Signed)
 Yes thanks-this looks excellent

## 2024-06-06 NOTE — Telephone Encounter (Signed)
 Indication: Afib, DVT, PE, Lupus anticoagulant with hypercoagulable state  Procedure: EDG on 10/23  Current warfarin dosing; take 1/2 tablet (2.5 mg) daily except take 1 tablet (5 mg) on Tuesday, Thursday and Saturday  Actual Wt: 90 kg Ideal Wt: 55 kg (actual wt >164% of ideal; will use adjusted wt for CrCl calculation) Adjusted Wt: 69 kg  CrCl: 46.36 mL/min (using adjusted wt)  Lovenox  dosing recommendation; 120 mg daily  Bridge;  10/18: Take last dose of warfarin 10/19: NO warfarin, NO Lovenox  10/20: NO warfarin, inject Lovenox  once in the AM 10/21: NO warfarin, inject Lovenox  once in the AM 10/22: NO warfarin, inject Lovenox  once in the AM (BEFORE 7 AM)  10/23: SURGERY; NO WARFARIN, NO LOVENOX   10/24: Take 1 tablet (5 mg) warfarin, inject Lovenox  once in the AM 10/25: Take 1 1/2 tablets (7.5 mg) warfarin, inject Lovenox  once in the AM 10/26: Take 1 tablet (5 mg) warfarin, inject Lovenox  once in the AM 10/27: Take 1/2 tablet (2.5 mg) warfarin, inject Lovenox  once in the AM 10/28: Take 1 tablet (5 mg) warfarin, inject Lovenox  once in the AM 10/29: Recheck INR; NO WARFARIN AND NO LOVENOX  UNTIL AFTER INR CHECK

## 2024-07-05 ENCOUNTER — Ambulatory Visit

## 2024-07-05 ENCOUNTER — Telehealth: Payer: Self-pay | Admitting: Gastroenterology

## 2024-07-05 DIAGNOSIS — Z7901 Long term (current) use of anticoagulants: Secondary | ICD-10-CM

## 2024-07-05 LAB — POCT INR: INR: 2.9 (ref 2.0–3.0)

## 2024-07-05 MED ORDER — ENOXAPARIN SODIUM 120 MG/0.8ML IJ SOSY: 120.0000 mg | PREFILLED_SYRINGE | INTRAMUSCULAR | 0 refills | Status: DC

## 2024-07-05 NOTE — Progress Notes (Signed)
 Pt reports she will have a repeat colonoscopy/EGD on 10/23. Pt will be placed on a Lovenox  bridge. Continue 1/2 tablet daily except take 1 tablet on Tuesday, Thursday, Saturday until you start instructions below.  10/18: Take last dose of warfarin 10/19: NO warfarin, NO Lovenox  10/20: NO warfarin, inject Lovenox  once in the AM 10/21: NO warfarin, inject Lovenox  once in the AM 10/22: NO warfarin, inject Lovenox  once in the AM (BEFORE 7 AM)   10/23: SURGERY; NO WARFARIN, NO LOVENOX    10/24: Take 1 tablet (5 mg) warfarin, inject Lovenox  once in the AM 10/25: Take 1 1/2 tablets (7.5 mg) warfarin, inject Lovenox  once in the AM 10/26: Take 1 tablet (5 mg) warfarin, inject Lovenox  once in the AM 10/27: Take 1/2 tablet (2.5 mg) warfarin, inject Lovenox  once in the AM 10/28: Take 1 tablet (5 mg) warfarin, inject Lovenox  once in the AM 10/29: Recheck INR; NO WARFARIN AND NO LOVENOX  UNTIL AFTER INR CHECK  Sent in script for Lovenox .

## 2024-07-05 NOTE — Telephone Encounter (Addendum)
 Procedure:Endoscopy/Colonoscopy Procedure date: 07/13/24 Procedure location: [facility] Arrival Time: 8:30 am Spoke with the patient Y/N: Yes Any prep concerns? No  Has the patient obtained the prep from the pharmacy ? Yes Do you have a care partner and transportation: Yes Any additional concerns? No

## 2024-07-05 NOTE — Progress Notes (Signed)
 I have reviewed and agree with note, evaluation, plan.   Garnette Lukes, MD

## 2024-07-05 NOTE — Patient Instructions (Addendum)
 Pre visit review using our clinic review tool, if applicable. No additional management support is needed unless otherwise documented below in the visit note.  Continue 1/2 tablet daily except take 1 tablet on Tuesday, Thursday, Saturday until you start instructions below.  10/18: Take last dose of warfarin 10/19: NO warfarin, NO Lovenox  10/20: NO warfarin, inject Lovenox  once in the AM 10/21: NO warfarin, inject Lovenox  once in the AM 10/22: NO warfarin, inject Lovenox  once in the AM (BEFORE 7 AM)   10/23: SURGERY; NO WARFARIN, NO LOVENOX    10/24: Take 1 tablet (5 mg) warfarin, inject Lovenox  once in the AM 10/25: Take 1 1/2 tablets (7.5 mg) warfarin, inject Lovenox  once in the AM 10/26: Take 1 tablet (5 mg) warfarin, inject Lovenox  once in the AM 10/27: Take 1/2 tablet (2.5 mg) warfarin, inject Lovenox  once in the AM 10/28: Take 1 tablet (5 mg) warfarin, inject Lovenox  once in the AM 10/29: Recheck INR; NO WARFARIN AND NO LOVENOX  UNTIL AFTER INR CHECK

## 2024-07-06 ENCOUNTER — Encounter (HOSPITAL_COMMUNITY): Payer: Self-pay | Admitting: Internal Medicine

## 2024-07-12 ENCOUNTER — Other Ambulatory Visit: Payer: Self-pay | Admitting: Family Medicine

## 2024-07-12 NOTE — H&P (Signed)
 [facility] History and Physical   Primary Care Physician:  Katrinka Garnette KIDD, MD   Reason for Procedure:   Hx neuroendocrine tumor and polyps of stomach; hx colon polyps  Plan:    ERD, colonoscopy     HPI: Erin Kirk is a 68 y.o. female with past medical history of asthma, CKD, depression/anxiety, diabetes, DVT, PE, GERD, HTN, hypothyroidism, IDA, lupus anticoagulant with hypercoagulable state, paroxysmal A-fib, primary malignant neuroendocrine tumor of the stomach, cholecystectomy, hysterectomy.  She is here for surveillance of neuroendocrine tumor of the stomach and gastric polyps plus hx colon polyps.  She does have chronic intermittent post-prandial diarrhea.  Warfarin has been held and a Lovenox  bridge is being used.  EGD 06/15/2023 - A few gastric polyps  - Chronic gastritis. Biopsied.  - The examination was otherwise normal. Path: A. GASTRIC, ANTRUM, BIOPSY:  - Gastric antral mucosa with mild chronic gastritis and a very focal  intestinal metaplasia  - Negative for dysplasia  - Helicobacter pylori-like organisms are not identified on routine HE  stain   B. GASTRIC, BODY, BIOPSY:  - Gastric antral-type mucosa showing chronic gastritis, and atrophy with  linear and micronodular ECL cell hyperplasia, consistent with atrophic  autoimmune gastritis  - Helicobacter pylori-like organisms are not identified on routine HE  stain   C. GASTRIC, DISTAL BODY, POLYPECTOMY:  - Gastric hyperplastic polyp with very focal intestinal metaplasia,  negative for dysplasia    Colonoscopy 06/12/2021 - Hemorrhoids found on digital rectal exam.  - The examined portion of the ileum was normal.  - Three 3 to 6 mm polyps in the ascending colon and in the cecum, removed with a cold snare. Resected and retrieved.  - One 15 mm polyp in the descending colon, removed using lift and cut and a cold snare. Resected and retrieved. Clips ( MR conditional) were placed.  - Normal  mucosa in the entire examined colon otherwise.  - Non- bleeding non- thrombosed external and internal hemorrhoids. - Recall 3 years Path: G. COLON, POLYPECTOMY:  - Tubular adenoma (x2 fragments).  - No high grade dysplasia or malignancy.   H. COLON, DESCENDING, POLYPECTOMY:  - Hyperplastic polyp.  - No dysplasia or malignancy.    Past Medical History:  Diagnosis Date   Allergy    Anxiety    Arthritis    Asthma    many years ago   Blood transfusion without reported diagnosis    Cataract    Chronic kidney disease    Clotting disorder    Depression    Husband died Jun 25, 2019   Diabetes (HCC)    not on medication   DVT (deep venous thrombosis) (HCC)    left arm   Dysrhythmia    Family history of bone cancer    Family history of breast cancer    Family history of lung cancer    Family history of ovarian cancer    Family history of testicular cancer    Family history of uterine cancer    GERD (gastroesophageal reflux disease)    Headache    Hypertension    Hypothyroidism    Iron  deficiency anemia 03/30/2019   Irregular heart beat    tachycardia   Lupus anticoagulant with hypercoagulable state    Melanoma (HCC)    2005- face    Paroxysmal atrial fibrillation (HCC)    Plantar fasciitis    Pneumonia 2019   Primary hypercoagulable state    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  Pulmonary embolism (HCC)    Thyroid  disease    hypothyroidism    Past Surgical History:  Procedure Laterality Date   ABDOMINAL HYSTERECTOMY     noncancerous   BACK SURGERY  1983   BIOPSY  06/19/2020   Procedure: BIOPSY;  Surgeon: Wilhelmenia Aloha Raddle., MD;  Location: [facility];  Service: Gastroenterology;;   BIOPSY  06/12/2021   Procedure: BIOPSY;  Surgeon: Wilhelmenia Aloha Raddle., MD;  Location: [facility];  Service: Gastroenterology;;   BIOPSY  05/28/2022   Procedure: BIOPSY;  Surgeon: Avram Lupita BRAVO, MD;  Location: [facility];   Service: Gastroenterology;;   BIOPSY  06/15/2023   Procedure: BIOPSY;  Surgeon: Avram Lupita BRAVO, MD;  Location: [facility];  Service: Gastroenterology;;   CHOLECYSTECTOMY     COLONOSCOPY     COLONOSCOPY WITH PROPOFOL  N/A 06/12/2021   Procedure: COLONOSCOPY WITH PROPOFOL ;  Surgeon: Wilhelmenia Aloha Raddle., MD;  Location: [facility] ENDOSCOPY;  Service: Gastroenterology;  Laterality: N/A;   ENDOSCOPIC MUCOSAL RESECTION N/A 07/12/2019   Procedure: ENDOSCOPIC MUCOSAL RESECTION;  Surgeon: Wilhelmenia Aloha Raddle., MD;  Location: [facility] ENDOSCOPY;  Service: Gastroenterology;  Laterality: N/A;   ENDOSCOPIC MUCOSAL RESECTION  06/19/2020   Procedure: ENDOSCOPIC MUCOSAL RESECTION;  Surgeon: Wilhelmenia Aloha Raddle., MD;  Location: [facility];  Service: Gastroenterology;;   ESOPHAGOGASTRODUODENOSCOPY N/A 06/19/2020   Procedure: ESOPHAGOGASTRODUODENOSCOPY (EGD);  Surgeon: Wilhelmenia Aloha Raddle., MD;  Location: [facility];  Service: Gastroenterology;  Laterality: N/A;   ESOPHAGOGASTRODUODENOSCOPY (EGD) WITH PROPOFOL  N/A 07/12/2019   Procedure: ESOPHAGOGASTRODUODENOSCOPY (EGD) WITH PROPOFOL ;  Surgeon: Wilhelmenia Aloha Raddle., MD;  Location: [facility]COPY;  Service: Gastroenterology;  Laterality: N/A;   ESOPHAGOGASTRODUODENOSCOPY (EGD) WITH PROPOFOL  N/A 10/09/2019   Procedure: ESOPHAGOGASTRODUODENOSCOPY (EGD) WITH PROPOFOL ;  Surgeon: Wilhelmenia Aloha Raddle., MD;  Location: [facility] ENDOSCOPY;  Service: Gastroenterology;  Laterality: N/A;   ESOPHAGOGASTRODUODENOSCOPY (EGD) WITH PROPOFOL  N/A 06/12/2021   Procedure: ESOPHAGOGASTRODUODENOSCOPY (EGD) WITH PROPOFOL ;  Surgeon: Wilhelmenia Aloha Raddle., MD;  Location: [facility] ENDOSCOPY;  Service: Gastroenterology;  Laterality: N/A;   ESOPHAGOGASTRODUODENOSCOPY (EGD) WITH PROPOFOL  N/A 05/28/2022   Procedure: ESOPHAGOGASTRODUODENOSCOPY (EGD) WITH PROPOFOL ;  Surgeon: Avram Lupita BRAVO, MD;  Location: [facility];  Service: Gastroenterology;  Laterality: N/A;   ESOPHAGOGASTRODUODENOSCOPY (EGD) WITH  PROPOFOL  N/A 06/15/2023   Procedure: ESOPHAGOGASTRODUODENOSCOPY (EGD) WITH PROPOFOL ;  Surgeon: Avram Lupita BRAVO, MD;  Location: [facility];  Service: Gastroenterology;  Laterality: N/A;   EUS N/A 07/12/2019   Procedure: UPPER ENDOSCOPIC ULTRASOUND (EUS) RADIAL;  Surgeon: Wilhelmenia Aloha Raddle., MD;  Location: [facility] ENDOSCOPY;  Service: Gastroenterology;  Laterality: N/A;   EUS N/A 10/09/2019   Procedure: UPPER ENDOSCOPIC ULTRASOUND (EUS) RADIAL;  Surgeon: Wilhelmenia Aloha Raddle., MD;  Location: [facility] ENDOSCOPY;  Service: Gastroenterology;  Laterality: N/A;   fatty tumor breast Right    removed   FINE NEEDLE ASPIRATION  06/19/2020   Procedure: FINE NEEDLE ASPIRATION (FNA) LINEAR;  Surgeon: Wilhelmenia Aloha Raddle., MD;  Location: [facility];  Service: Gastroenterology;;   FOREIGN BODY REMOVAL  10/09/2019   Procedure: FOREIGN BODY REMOVAL;  Surgeon: Wilhelmenia Aloha Raddle., MD;  Location: [facility] ENDOSCOPY;  Service: Gastroenterology;;   FOREIGN BODY REMOVAL  06/19/2020   Procedure: FOREIGN BODY REMOVAL;  Surgeon: Wilhelmenia Aloha Raddle., MD;  Location: [facility];  Service: Gastroenterology;;  polyp removal with net    HEMOSTASIS CLIP PLACEMENT  07/12/2019   Procedure: HEMOSTASIS CLIP PLACEMENT;  Surgeon: Wilhelmenia Aloha Raddle., MD;  Location: [facility] ENDOSCOPY;  Service: Gastroenterology;;   HEMOSTASIS CLIP PLACEMENT  10/09/2019   Procedure: HEMOSTASIS CLIP PLACEMENT;  Surgeon: Wilhelmenia Aloha Raddle.,  MD;  Location: [facility] ENDOSCOPY;  Service: Gastroenterology;;   HEMOSTASIS CLIP PLACEMENT  06/19/2020   Procedure: HEMOSTASIS CLIP PLACEMENT;  Surgeon: Wilhelmenia Aloha Raddle., MD;  Location: [facility];  Service: Gastroenterology;;   HEMOSTASIS CLIP PLACEMENT  06/12/2021   Procedure: HEMOSTASIS CLIP PLACEMENT;  Surgeon: Wilhelmenia Aloha Raddle., MD;  Location: [facility] ENDOSCOPY;  Service: Gastroenterology;;   HEMOSTASIS CLIP PLACEMENT  05/28/2022   Procedure: HEMOSTASIS CLIP PLACEMENT;  Surgeon: Avram Lupita BRAVO, MD;   Location: [facility];  Service: Gastroenterology;;   HEMOSTASIS CLIP PLACEMENT  06/15/2023   Procedure: HEMOSTASIS CLIP PLACEMENT;  Surgeon: Avram Lupita BRAVO, MD;  Location: [facility];  Service: Gastroenterology;;   HEMOSTASIS CONTROL  07/12/2019   Procedure: HEMOSTASIS CONTROL;  Surgeon: Wilhelmenia Aloha Raddle., MD;  Location: [facility] ENDOSCOPY;  Service: Gastroenterology;;   HYSTEROTOMY     KNEE ARTHROSCOPY Left    Lymp nodes Right 08/2004   MELANOMA EXCISION Right 2005   face    OOPHORECTOMY Bilateral    POLYPECTOMY  07/12/2019   Procedure: POLYPECTOMY;  Surgeon: Wilhelmenia Aloha Raddle., MD;  Location: [facility] ENDOSCOPY;  Service: Gastroenterology;;   POLYPECTOMY  10/09/2019   Procedure: POLYPECTOMY;  Surgeon: Wilhelmenia Aloha Raddle., MD;  Location: [facility] ENDOSCOPY;  Service: Gastroenterology;;   POLYPECTOMY  06/19/2020   Procedure: POLYPECTOMY;  Surgeon: Wilhelmenia Aloha Raddle., MD;  Location: [facility];  Service: Gastroenterology;;   POLYPECTOMY  06/12/2021   Procedure: POLYPECTOMY;  Surgeon: Wilhelmenia Aloha Raddle., MD;  Location: [facility] ENDOSCOPY;  Service: Gastroenterology;;   POLYPECTOMY  05/28/2022   Procedure: POLYPECTOMY;  Surgeon: Avram Lupita BRAVO, MD;  Location: [facility];  Service: Gastroenterology;;   POLYPECTOMY  06/15/2023   Procedure: POLYPECTOMY;  Surgeon: Avram Lupita BRAVO, MD;  Location: [facility];  Service: Gastroenterology;;   removal lymphnodes in right neck     SUBMUCOSAL LIFTING INJECTION  07/12/2019   Procedure: SUBMUCOSAL LIFTING INJECTION;  Surgeon: Wilhelmenia Aloha Raddle., MD;  Location: [facility] ENDOSCOPY;  Service: Gastroenterology;;   SUBMUCOSAL LIFTING INJECTION  10/09/2019   Procedure: SUBMUCOSAL LIFTING INJECTION;  Surgeon: Wilhelmenia Aloha Raddle., MD;  Location: [facility] ENDOSCOPY;  Service: Gastroenterology;;   SUBMUCOSAL LIFTING INJECTION  06/12/2021   Procedure: SUBMUCOSAL LIFTING INJECTION;  Surgeon: Wilhelmenia Aloha Raddle., MD;  Location: [facility] ENDOSCOPY;  Service:  Gastroenterology;;   SUBMUCOSAL LIFTING INJECTION  05/28/2022   Procedure: SUBMUCOSAL LIFTING INJECTION;  Surgeon: Avram Lupita BRAVO, MD;  Location: [facility];  Service: Gastroenterology;;   UPPER ESOPHAGEAL ENDOSCOPIC ULTRASOUND (EUS) N/A 06/19/2020   Procedure: UPPER ESOPHAGEAL ENDOSCOPIC ULTRASOUND (EUS);  Surgeon: Wilhelmenia Aloha Raddle., MD;  Location: [facility];  Service: Gastroenterology;  Laterality: N/A;   UPPER GASTROINTESTINAL ENDOSCOPY       Current Facility-Administered Medications  Medication Dose Route Frequency Provider Last Rate Last Admin   phenazopyridine  (PYRIDIUM ) tablet 100 mg  100 mg Oral TID WC Morrison, Ryan G, MD       Current Outpatient Medications  Medication Sig Dispense Refill   acetaminophen  (TYLENOL ) 500 MG tablet Take 500 mg by mouth every 6 (six) hours as needed (for pain.).      Alum Hydroxide-Mag Trisilicate (GAVISCON) 80-14.2 MG CHEW Chew 1-2 tablets by mouth 2 (two) times daily as needed (indigestion).     Cholecalciferol (VITAMIN D3) 50 MCG (2000 UT) TABS Take 2,000 Units by mouth daily.     diltiazem  (CARDIZEM  CD) 360 MG 24 hr capsule Take 1 capsule by mouth at bedtime 90 capsule 0   docusate sodium (COLACE) 100 MG capsule Take 100 mg by mouth  at bedtime. Stool softner     enoxaparin  (LOVENOX ) 120 MG/0.8ML injection Inject 0.8 mLs (120 mg total) into the skin daily. USE AS DIRECTED BY ANTICOAGULATION CLINIC 6.4 mL 0   ferrous sulfate  325 (65 FE) MG tablet Take 1 tablet (325 mg total) by mouth daily with breakfast.     levothyroxine  (SYNTHROID ) 100 MCG tablet Take 1 tablet by mouth once daily 90 tablet 0   loratadine (CLARITIN) 10 MG tablet Take 10 mg by mouth daily as needed for allergies.     metoprolol  succinate (TOPROL -XL) 25 MG 24 hr tablet Take 1 tablet by mouth once daily 90 tablet 0   omeprazole  (PRILOSEC) 40 MG capsule Take 1 capsule by mouth once daily 90 capsule 0   telmisartan  (MICARDIS ) 40 MG tablet Take 1 tablet by mouth once daily 90  tablet 0   triamterene -hydrochlorothiazide  (MAXZIDE -25) 37.5-25 MG tablet Take 1 tablet by mouth once daily 90 tablet 0   vitamin B-12 (CYANOCOBALAMIN ) 1000 MCG tablet Take 1,000 mcg by mouth daily.     warfarin (COUMADIN ) 5 MG tablet TAKE 1 TABLET BY MOUTH DAILY EXCEPT TAKE 1/2 TABLET ON MONDAYS AND THURSDAYS OR AS DIRECTED BY ANTICOAGULATION CLINIC 105 tablet 3    Allergies as of 05/18/2024 - Review Complete 05/18/2024  Allergen Reaction Noted   Iohexol  Anaphylaxis, Hives, and Shortness Of Breath 09/24/2012   Amoxicillin-pot clavulanate Swelling and Other (See Comments) 03/09/2007   Other  05/29/2014    Family History  Problem Relation Age of Onset   Coronary artery disease Mother        Mom 27, Dad 57-smoker   Diabetes Mother    Ovarian cancer Mother 86       not officially diagnosed   Hypertension Father    Diabetes Father    Mitral valve prolapse Sister    Lung cancer Sister    Diabetes Sister    Diabetes Sister    Diabetes Sister    Atrial fibrillation Sister    Breast cancer Maternal Aunt 74   Alzheimer's disease Maternal Aunt    Breast cancer Half-Sister 16   Breast cancer Half-Sister 66   Lung cancer Nephew    Bone cancer Nephew 64   Uterine cancer Niece 76   Breast cancer Niece        diagnosed mid-60s   Cancer Niece 84       unknown type   Breast cancer Other        maternal great-aunt, diagnosed in her 41s   Breast cancer Other        4th degree maternal relative, diagnosed in her 70s   Colon cancer Neg Hx    Esophageal cancer Neg Hx    Pancreatic cancer Neg Hx    Rectal cancer Neg Hx    Stomach cancer Neg Hx    Inflammatory bowel disease Neg Hx    Liver disease Neg Hx    Colon polyps Neg Hx     Social History   Socioeconomic History   Marital status: Widowed    Spouse name: Not on file   Number of children: Not on file   Years of education: Not on file   Highest education level: Not on file  Occupational History   Not on file  Tobacco Use    Smoking status: Never   Smokeless tobacco: Never  Vaping Use   Vaping status: Never Used  Substance and Sexual Activity   Alcohol use: Never    Comment: no  Drug use: Never   Sexual activity: Yes  Other Topics Concern   Not on file  Social History Narrative   Married 1971. 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    Social Drivers of Corporate investment banker Strain: Low Risk  (02/29/2024)   Overall Financial Resource Strain (CARDIA)    Difficulty of Paying Living Expenses: Not hard at all  Food Insecurity: No Food Insecurity (02/29/2024)   Hunger Vital Sign    Worried About Running Out of Food in the Last Year: Never true    Ran Out of Food in the Last Year: Never true  Transportation Needs: No Transportation Needs (02/29/2024)   PRAPARE - Administrator, Civil Service (Medical): No    Lack of Transportation (Non-Medical): No  Physical Activity: Sufficiently Active (02/29/2024)   Exercise Vital Sign    Days of Exercise per Week: 5 days    Minutes of Exercise per Session: 60 min  Stress: No Stress Concern Present (02/29/2024)   [employer] of Occupational Health - Occupational Stress Questionnaire    Feeling of Stress : Not at all  Social Connections: Moderately Isolated (02/29/2024)   Social Connection and Isolation Panel    Frequency of Communication with Friends and Family: More than three times a week    Frequency of Social Gatherings with Friends and Family: More than three times a week    Attends Religious Services: More than 4 times per year    Active Member of [employer] or Organizations: No    Attends Banker Meetings: Never    Marital Status: Widowed  Intimate Partner Violence: Not At Risk (02/29/2024)   Humiliation, Afraid, Rape, and Kick  questionnaire    Fear of Current or Ex-Partner: No    Emotionally Abused: No    Physically Abused: No    Sexually Abused: No    Review of Systems: Positive for *** All other review of systems negative except as mentioned in the HPI.  Physical Exam: Vital signs LMP  (LMP Unknown)   General:   Alert,  Well-developed, well-nourished, pleasant and cooperative in NAD Lungs:  Clear throughout to auscultation.   Heart:  Regular rate and rhythm; no murmurs, clicks, rubs,  or gallops. Abdomen:  Soft, nontender and nondistended. Normal bowel sounds.   Neuro/Psych:  Alert and cooperative. Normal mood and affect. A and O x 3   @Erin Kirk  Erin Commander, MD, [facility] [phone] (pager) 07/12/2024 8:56 PM@

## 2024-07-13 ENCOUNTER — Ambulatory Visit (HOSPITAL_COMMUNITY): Admitting: Certified Registered"

## 2024-07-13 ENCOUNTER — Encounter (HOSPITAL_COMMUNITY): Payer: Self-pay | Admitting: Internal Medicine

## 2024-07-13 ENCOUNTER — Ambulatory Visit (HOSPITAL_COMMUNITY)
Admission: RE | Admit: 2024-07-13 | Discharge: 2024-07-13 | Disposition: A | Discharge status: Home / Self Care | Attending: Internal Medicine | Admitting: Internal Medicine

## 2024-07-13 ENCOUNTER — Ambulatory Visit (HOSPITAL_BASED_OUTPATIENT_CLINIC_OR_DEPARTMENT_OTHER): Admitting: Certified Registered"

## 2024-07-13 ENCOUNTER — Other Ambulatory Visit: Payer: Self-pay

## 2024-07-13 ENCOUNTER — Encounter (HOSPITAL_COMMUNITY): Admission: RE | Disposition: A | Payer: Self-pay | Source: Home / Self Care | Attending: Internal Medicine

## 2024-07-13 DIAGNOSIS — J45909 Unspecified asthma, uncomplicated: Secondary | ICD-10-CM | POA: Insufficient documentation

## 2024-07-13 DIAGNOSIS — K3189 Other diseases of stomach and duodenum: Secondary | ICD-10-CM | POA: Insufficient documentation

## 2024-07-13 DIAGNOSIS — N189 Chronic kidney disease, unspecified: Secondary | ICD-10-CM | POA: Diagnosis not present

## 2024-07-13 DIAGNOSIS — D3A8 Other benign neuroendocrine tumors: Secondary | ICD-10-CM | POA: Diagnosis not present

## 2024-07-13 DIAGNOSIS — I129 Hypertensive chronic kidney disease with stage 1 through stage 4 chronic kidney disease, or unspecified chronic kidney disease: Secondary | ICD-10-CM | POA: Diagnosis not present

## 2024-07-13 DIAGNOSIS — K317 Polyp of stomach and duodenum: Secondary | ICD-10-CM | POA: Diagnosis not present

## 2024-07-13 DIAGNOSIS — Z1211 Encounter for screening for malignant neoplasm of colon: Secondary | ICD-10-CM | POA: Insufficient documentation

## 2024-07-13 DIAGNOSIS — K219 Gastro-esophageal reflux disease without esophagitis: Secondary | ICD-10-CM | POA: Insufficient documentation

## 2024-07-13 DIAGNOSIS — K2950 Unspecified chronic gastritis without bleeding: Secondary | ICD-10-CM

## 2024-07-13 DIAGNOSIS — K869 Disease of pancreas, unspecified: Secondary | ICD-10-CM | POA: Insufficient documentation

## 2024-07-13 DIAGNOSIS — F418 Other specified anxiety disorders: Secondary | ICD-10-CM | POA: Diagnosis not present

## 2024-07-13 DIAGNOSIS — K31A15 Gastric intestinal metaplasia without dysplasia, involving multiple sites: Secondary | ICD-10-CM | POA: Diagnosis not present

## 2024-07-13 DIAGNOSIS — E1122 Type 2 diabetes mellitus with diabetic chronic kidney disease: Secondary | ICD-10-CM | POA: Diagnosis not present

## 2024-07-13 DIAGNOSIS — I10 Essential (primary) hypertension: Secondary | ICD-10-CM | POA: Diagnosis not present

## 2024-07-13 DIAGNOSIS — Z860100 Personal history of colon polyps, unspecified: Secondary | ICD-10-CM

## 2024-07-13 DIAGNOSIS — Z09 Encounter for follow-up examination after completed treatment for conditions other than malignant neoplasm: Secondary | ICD-10-CM | POA: Diagnosis not present

## 2024-07-13 HISTORY — PX: ESOPHAGOGASTRODUODENOSCOPY: SHX5428

## 2024-07-13 HISTORY — PX: COLONOSCOPY: SHX5424

## 2024-07-13 SURGERY — EGD (ESOPHAGOGASTRODUODENOSCOPY)
Anesthesia: Monitor Anesthesia Care

## 2024-07-13 MED ORDER — LIDOCAINE 2% (20 MG/ML) 5 ML SYRINGE
INTRAMUSCULAR | Status: DC | PRN
  Administered 2024-07-13: 100 mg via INTRAVENOUS

## 2024-07-13 MED ORDER — SODIUM CHLORIDE 0.9 % IV SOLN: INTRAVENOUS | Status: DC

## 2024-07-13 MED ORDER — PROPOFOL 10 MG/ML IV BOLUS
INTRAVENOUS | Status: DC | PRN
  Administered 2024-07-13: 20 mg via INTRAVENOUS
  Administered 2024-07-13: 10 mg via INTRAVENOUS

## 2024-07-13 MED ORDER — PROPOFOL 500 MG/50ML IV EMUL
INTRAVENOUS | Status: DC | PRN
  Administered 2024-07-13: 150 ug/kg/min via INTRAVENOUS

## 2024-07-13 MED ORDER — PHENYLEPHRINE HCL (PRESSORS) 10 MG/ML IV SOLN
INTRAVENOUS | Status: DC | PRN
  Administered 2024-07-13 (×3): 120 ug via INTRAVENOUS
  Administered 2024-07-13: 80 ug via INTRAVENOUS
  Administered 2024-07-13 (×2): 120 ug via INTRAVENOUS

## 2024-07-13 NOTE — Transfer of Care (Signed)
 Immediate Anesthesia Transfer of Care Note  Patient: Erin Kirk  Procedure(s) Performed: EGD (ESOPHAGOGASTRODUODENOSCOPY) COLONOSCOPY  Patient Location: PACU  Anesthesia Type:MAC  Level of Consciousness: drowsy and responds to stimulation  Airway & Oxygen Therapy: Patient Spontanous Breathing and Patient connected to face mask oxygen  Post-op Assessment: report to RN, pt stable  Post vital signs: Reviewed and stable  Last Vitals:  Vitals Value Taken Time  BP 93/66   Temp    Pulse 37 07/13/24 11:28  Resp 11 07/13/24 11:28  SpO2 100 % 07/13/24 11:28  Vitals shown include unfiled device data.  Last Pain:  Vitals:   07/13/24 0933  TempSrc: Temporal  PainSc: 0-No pain      Patients Stated Pain Goal: 0 (07/13/24 0933)  Complications: No notable events documented.

## 2024-07-13 NOTE — Anesthesia Postprocedure Evaluation (Signed)
 Anesthesia Post Note  Patient: Erin Kirk  Procedure(s) Performed: EGD (ESOPHAGOGASTRODUODENOSCOPY) COLONOSCOPY     Patient location during evaluation: PACU Anesthesia Type: MAC Level of consciousness: awake and alert Pain management: pain level controlled Vital Signs Assessment: post-procedure vital signs reviewed and stable Respiratory status: spontaneous breathing, nonlabored ventilation, respiratory function stable and patient connected to nasal cannula oxygen Cardiovascular status: stable and blood pressure returned to baseline Postop Assessment: no apparent nausea or vomiting Anesthetic complications: no   No notable events documented.  Last Vitals:  Vitals:   07/13/24 1200 07/13/24 1204  BP: 127/73   Pulse: 78 86  Resp: 17 17  Temp:    SpO2: 97% 97%    Last Pain:  Vitals:   07/13/24 1204  TempSrc:   PainSc: 0-No pain                 [name]

## 2024-07-13 NOTE — Discharge Instructions (Addendum)
 I removed 1 polyp in the stomach.  I did not see anything that looks like cancer or anything bad.  I did take biopsies as usual.  Colonoscopy was free of polyps.  I will let you know what the stomach biopsies and polyp pathology showed and when to repeat an exam. Will consider extending to 2 years.  I am recommending a repeat colonoscopy in 5 years.  Please restart Lovenox  and warfarin as per the plan laid out by Dr. Katrinka and his staff.  (You will restart these tomorrow).  I appreciate the opportunity to care for you. Lupita CHARLENA Commander, MD, FACG   YOU HAD AN ENDOSCOPIC PROCEDURE TODAY: Refer to the procedure report and other information in the discharge instructions given to you for any specific questions about what was found during the examination. If this information does not answer your questions, please call Dr. Darilyn office at [phone] to clarify.   YOU SHOULD EXPECT: Some feelings of bloating in the abdomen. Passage of more gas than usual. Walking can help get rid of the air that was put into your GI tract during the procedure and reduce the bloating. If you had a lower endoscopy (such as a colonoscopy or flexible sigmoidoscopy) you may notice spotting of blood in your stool or on the toilet paper. Some abdominal soreness may be present for a day or two, also.  DIET: Your first meal following the procedure should be a light meal and then it is ok to progress to your normal diet. A half-sandwich or bowl of soup is an example of a good first meal. Heavy or fried foods are harder to digest and may make you feel nauseous or bloated. Drink plenty of fluids but you should avoid alcoholic beverages for 24 hours.   ACTIVITY: Your care partner should take you home directly after the procedure. You should plan to take it easy, moving slowly for the rest of the day. You can resume normal activity the day after the procedure however YOU SHOULD NOT DRIVE, use power tools, machinery or perform  tasks that involve climbing or major physical exertion for 24 hours (because of the sedation medicines used during the test).   SYMPTOMS TO REPORT IMMEDIATELY: A gastroenterologist can be reached at any hour. Please call [phone]  for any of the following symptoms:  Following lower endoscopy (colonoscopy, flexible sigmoidoscopy) Excessive amounts of blood in the stool  Significant tenderness, worsening of abdominal pains  Swelling of the abdomen that is new, acute  Fever of 100 or higher  Following upper endoscopy (EGD, EUS, ERCP, esophageal dilation) Vomiting of blood or coffee ground material  New, significant abdominal pain  New, significant chest pain or pain under the shoulder blades  Painful or persistently difficult swallowing  New shortness of breath  Black, tarry-looking or red, bloody stools  FOLLOW UP:  If any biopsies were taken you will be contacted by phone or by letter within the next 1-3 weeks. Call [phone]  if you have not heard about the biopsies in 3 weeks.  Please also call with any specific questions about appointments or follow up tests.

## 2024-07-13 NOTE — Anesthesia Procedure Notes (Signed)
 Procedure Name: MAC Date/Time: 07/13/2024 10:40 AM  Performed by: Metta Andrea NOVAK, CRNAPre-anesthesia Checklist: Patient identified, Emergency Drugs available, Suction available, Patient being monitored and Timeout performed Oxygen Delivery Method: Simple face mask Placement Confirmation: positive ETCO2

## 2024-07-13 NOTE — Anesthesia Preprocedure Evaluation (Addendum)
 Anesthesia Evaluation  Patient identified by MRN, date of birth, ID band Patient awake    Reviewed: Allergy & Precautions, NPO status , Patient's Chart, lab work & pertinent test results  Airway Mallampati: II  TM Distance: >3 FB Neck ROM: Full    Dental  (+) Teeth Intact, Dental Advisory Given   Pulmonary asthma    breath sounds clear to auscultation       Cardiovascular hypertension, Pt. on medications and Pt. on home beta blockers + dysrhythmias  Rhythm:Regular Rate:Normal     Neuro/Psych  Headaches PSYCHIATRIC DISORDERS Anxiety Depression     Neuromuscular disease    GI/Hepatic Neg liver ROS,GERD  Medicated,,  Endo/Other  diabetesHypothyroidism    Renal/GU Renal disease  negative genitourinary   Musculoskeletal  (+) Arthritis ,    Abdominal   Peds  Hematology  (+) Blood dyscrasia, anemia   Anesthesia Other Findings   Reproductive/Obstetrics                              Anesthesia Physical Anesthesia Plan  ASA: 3  Anesthesia Plan: MAC   Post-op Pain Management: Minimal or no pain anticipated   Induction: Intravenous  PONV Risk Score and Plan: 0 and Propofol  infusion  Airway Management Planned: Nasal Cannula and Natural Airway  Additional Equipment: None  Intra-op Plan:   Post-operative Plan:   Informed Consent: I have reviewed the patients History and Physical, chart, labs and discussed the procedure including the risks, benefits and alternatives for the proposed anesthesia with the patient or authorized representative who has indicated his/her understanding and acceptance.       Plan Discussed with: CRNA  Anesthesia Plan Comments:          Anesthesia Quick Evaluation

## 2024-07-13 NOTE — Op Note (Addendum)
 [facility] Patient Name: Erin Kirk Procedure Date: 07/13/2024 MRN: [id number] Attending MD: Lupita FORBES Commander , MD, [id number] Date of Birth: Jun 27, 1956 CSN: [id number] Age: 68 Admit Type: Outpatient Procedure:                Upper GI endoscopy Indications:              Follow-up of gastric polyps - hx neuroendocrine                            tumors (well-differentiated), adenomas and                            hyperplastic polyps - last exam 2024 Providers:                Lupita CHARLENA Commander, MD, Darleene Bare, RN, Haskel Chris, Technician Referring MD:              Medicines:                Monitored Anesthesia Care Complications:            No immediate complications. Estimated Blood Loss:     Estimated blood loss was minimal. Procedure:                Pre-Anesthesia Assessment:                           - Prior to the procedure, a History and Physical                            was performed, and patient medications and                            allergies were reviewed. The patient's tolerance of                            previous anesthesia was also reviewed. The risks                            and benefits of the procedure and the sedation                            options and risks were discussed with the patient.                            All questions were answered, and informed consent                            was obtained. Prior Anticoagulants: The patient                            last took Coumadin  (warfarin) 5 days and Lovenox                             (  enoxaparin ) 1 day prior to the procedure. ASA                            Grade Assessment: III - A patient with severe                            systemic disease. After reviewing the risks and                            benefits, the patient was deemed in satisfactory                            condition to undergo the procedure.                           After  obtaining informed consent, the endoscope was                            passed under direct vision. Throughout the                            procedure, the patient's blood pressure, pulse, and                            oxygen saturations were monitored continuously. The                            [id number]-H190 ([id number]) Olympus endoscope was introduced                            through the mouth, and advanced to the second part                            of duodenum. The upper GI endoscopy was                            accomplished without difficulty. The patient                            tolerated the procedure well. Scope In: Scope Out: Findings:      A single 12 mm sessile polyp with no bleeding and no stigmata of recent       bleeding was found on the greater curvature of the gastric body. The       polyp was removed with a hot snare. Resection and retrieval were       complete. Verification of patient identification for the specimen was       done. Estimated blood loss was minimal. To prevent bleeding after the       polypectomy, two hemostatic clips were successfully placed (MR       conditional). Clip manufacturer: [employer]. Purastat appled       after clips with hemostasis      A few diminutive sessile polyps were found in the cardia, in the gastric       fundus and in the gastric  body. Also saw residual clips from prior       polypectomies.      Diffuse moderate inflammation characterized by congestion (edema),       erythema and granularity was found in the entire examined stomach.       Biopsies were taken with a cold forceps for histology using [place]       protocol. Verification of patient identification for the specimen was       done. Estimated blood loss was minimal.      The exam was otherwise without abnormality.      The cardia and gastric fundus were otherwise normal on retroflexion. Impression:               - A single gastric polyp. Resected and  retrieved.                            Clips (MR conditional) were placed and Purastat                            applied. Clip manufacturer: [employer].                           - A few other diminutive gastric polyps + clipped                            areas from prior polypectomies.                           - Gastritis. Biopsied.                           - The examination was otherwise normal.                           - 1. Multiple gastric carcinoid tumors Upper                            endoscopy 05/05/2019 with resection of multiple                            gastric carcinoid tumors EUS 07/12/2019?resection                            of multiple gastric carcinoid tumors, positive                            resection margin at the gastric cardia polypectomy                            site, tubular adenomas and hyperplastic polyps                            removed from the lesser curvature Elevated                            chromogranin A level CT abdomen/pelvis 05/18/2019?no  evidence of a gastric mass or metastatic disease                            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 3 areas of                            scar/clip/polypoid tissue removed with negative                            pathology EUS 10/09/2019?4 mm pancreas body lesion,                            FNA biopsy negative, no adenopathy                           EUS 06/19/2020?no evidence of recurrent tumor in                            areas of gastric body scars, nodularity was noted                            underneath 2 endoclips in the greater curvature,                            this area was biopsied and polyps were removed from                            the middle gastric body and proximal gastric                             antrum, biopsies from eerosions and erythema were                            taken at the greater curvature, lesser curvature,                            and antrum. An irregular lesion was identified in                            the pancreas body measuring 3.4 x 4.6 mm, the                            pancreas lesion was biopsied. The pathology from                            the pancreas FNA was negative, the pathology from  multiple biopsies including the gastric body scar,                            antrum polyp, greater curvature biopsy, lesser                            curvature biopsy, and gastric cardia biopsy                            revealed low-grade neuroendocrine tumor Netspot                             04/14/2021?no residual avid tumor in the stomach, no                            evidence of tracer avid metastatic disease,                            decrease in tracer activity associated with the                            pancreas lesion                           - Netspot  7/25/2022no residual avid tumor in the                            stomach, no evidence of tracer avid metastatic                            diseas Moderate Sedation:      Not Applicable - Patient had care per Anesthesia. Recommendation:           - Patient has a contact number available for                            emergencies. The signs and symptoms of potential                            delayed complications were discussed with the                            patient. Return to normal activities tomorrow.                            Written discharge instructions were provided to the                            patient.                           - Resume previous diet.                           - Continue present medications.                           -  Resume Coumadin  (warfarin) tomorrow and Lovenox                             (enoxaparin ) tomorrow at prior doses.  Refer to                            primary physician for further adjustment of therapy.                           - Await pathology results.                           - See the other procedure note for documentation of                            additional recommendations. Procedure Code(s):        --- Professional ---                           [phone], Esophagogastroduodenoscopy, flexible,                            transoral; with removal of tumor(s), polyp(s), or                            other lesion(s) by snare technique                           43239, 59, Esophagogastroduodenoscopy, flexible,                            transoral; with biopsy, single or multiple Diagnosis Code(s):        --- Professional ---                           K31.7, Polyp of stomach and duodenum                           K29.70, Gastritis, unspecified, without bleeding CPT copyright 2022 American Medical Association. All rights reserved. The codes documented in this report are preliminary and upon coder review may  be revised to meet current compliance requirements. Lupita FORBES Commander, MD 07/13/2024 11:46:52 AM This report has been signed electronically. Number of Addenda: 0

## 2024-07-13 NOTE — Op Note (Signed)
 [facility] Patient Name: Erin Kirk Procedure Date: 07/13/2024 MRN: [id number] Attending MD: Lupita FORBES Commander , MD, [id number] Date of Birth: May 29, 1956 CSN: [id number] Age: 68 Admit Type: Outpatient Procedure:                Colonoscopy Indications:              High risk colon cancer surveillance: Personal                            history of colonic polyps, Last colonoscopy: 2020 Providers:                Lupita CHARLENA Commander, MD, Darleene Bare, RN, Haskel Chris, Technician Referring MD:              Medicines:                Monitored Anesthesia Care Complications:            No immediate complications. Estimated Blood Loss:     Estimated blood loss: none. Procedure:                Pre-Anesthesia Assessment:                           - Prior to the procedure, a History and Physical                            was performed, and patient medications and                            allergies were reviewed. The patient's tolerance of                            previous anesthesia was also reviewed. The risks                            and benefits of the procedure and the sedation                            options and risks were discussed with the patient.                            All questions were answered, and informed consent                            was obtained. Prior Anticoagulants: The patient                            last took Coumadin  (warfarin) 5 days and Lovenox                             (enoxaparin ) 1 day prior to the procedure. ASA  Grade Assessment: III - A patient with severe                            systemic disease. After reviewing the risks and                            benefits, the patient was deemed in satisfactory                            condition to undergo the procedure.                           After obtaining informed consent, the colonoscope                            was  passed under direct vision. Throughout the                            procedure, the patient's blood pressure, pulse, and                            oxygen saturations were monitored continuously. The                            [id number]-[id number] ([id number]) colonoscope was introduced                            through the anus and advanced to the the cecum,                            identified by appendiceal orifice and ileocecal                            valve. The ileocecal valve, appendiceal orifice,                            and rectum were photographed. The colonoscopy was                            performed without difficulty. The patient tolerated                            the procedure well. The quality of the bowel                            preparation was adequate. The bowel preparation                            used was SUPREP via split dose instruction. Scope In: 11:06:26 AM Scope Out: 11:22:38 AM Scope Withdrawal Time: 0 hours 10 minutes 47 seconds  Total Procedure Duration: 0 hours 16 minutes 12 seconds  Findings:      The perianal and digital rectal examinations were normal.      The entire examined colon appeared normal on direct  and retroflexion       views. Impression:               - The entire examined colon is normal on direct and                            retroflexion views.                           - No specimens collected. Moderate Sedation:      Not Applicable - Patient had care per Anesthesia. Recommendation:           - Patient has a contact number available for                            emergencies. The signs and symptoms of potential                            delayed complications were discussed with the                            patient. Return to normal activities tomorrow.                            Written discharge instructions were provided to the                            patient.                           - Resume previous diet.                            - Continue present medications.                           - Resume Coumadin  (warfarin) tomorrow and Lovenox                             (enoxaparin ) tomorrow at prior doses. Refer to                            primary physician for further adjustment of therapy.                           - Repeat colonoscopy in 5 years for surveillance                            (due to polyp[ hx and hx gastric adenomas + hx                            melanoma and family hx GU ca though her genetics                            are negative). Polyp hx: 03/2016 9 mm adenoma  05/05/2019 2 diminutive polyps - adenomas Procedure Code(s):        --- Professional ---                           H9894, Colorectal cancer screening; colonoscopy on                            individual at high risk Diagnosis Code(s):        --- Professional ---                           Z86.010, Personal history of colonic polyps CPT copyright 2022 American Medical Association. All rights reserved. The codes documented in this report are preliminary and upon coder review may  be revised to meet current compliance requirements. Lupita FORBES Commander, MD 07/13/2024 11:51:23 AM This report has been signed electronically. Number of Addenda: 0

## 2024-07-14 ENCOUNTER — Encounter (HOSPITAL_COMMUNITY): Payer: Self-pay | Admitting: Internal Medicine

## 2024-07-17 LAB — SURGICAL PATHOLOGY

## 2024-07-19 ENCOUNTER — Ambulatory Visit (INDEPENDENT_AMBULATORY_CARE_PROVIDER_SITE_OTHER)

## 2024-07-19 DIAGNOSIS — Z7901 Long term (current) use of anticoagulants: Secondary | ICD-10-CM

## 2024-07-19 LAB — POCT INR: INR: 1.8 — AB (ref 2.0–3.0)

## 2024-07-19 NOTE — Progress Notes (Signed)
 I have reviewed and agree with note, evaluation, plan. I agree with prolonging Lovenox  injections but patient has declined   Garnette Lukes, MD

## 2024-07-19 NOTE — Progress Notes (Signed)
 Indication: Afib, DVT, PE, Lupus anticoagulant with hypercoagulable state Pt had a colonoscopy on 10/23 and was placed on a Lovenox  bridge. Pt reported she had severe nausea and vomiting with her colonoscopy prep. She has only started to feel better in the last couple of days, causing changes in her diet.  Advised Lovenox  injections should be continued until she is in range. Pt did not want to continue the injections due to cost and discomfort. Stop Lovenox  injections. Increase dose today to take 1 tablet and increase dose tomorrow to take 1 1/2 tablets and then continue 1/2 tablet daily except take 1 tablet on Tuesday, Thursday, Saturday. Recheck in 2 weeks.

## 2024-07-19 NOTE — Patient Instructions (Addendum)
 Pre visit review using our clinic review tool, if applicable. No additional management support is needed unless otherwise documented below in the visit note.  Increase dose today to take 1 tablet and increase dose tomorrow to take 1 1/2 tablets and then continue 1/2 tablet daily except take 1 tablet on Tuesday, Thursday, Saturday. Recheck in 2 weeks.

## 2024-07-21 ENCOUNTER — Other Ambulatory Visit: Payer: Self-pay | Admitting: Family Medicine

## 2024-07-21 DIAGNOSIS — Z7901 Long term (current) use of anticoagulants: Secondary | ICD-10-CM

## 2024-07-26 ENCOUNTER — Telehealth: Payer: Self-pay | Admitting: Family Medicine

## 2024-07-26 ENCOUNTER — Telehealth: Payer: Self-pay | Admitting: Internal Medicine

## 2024-07-26 NOTE — Telephone Encounter (Signed)
 Spoke with patient and she stated she called the wrong office back.    Copied from CRM [id number]. Topic: General - Call Back - No Documentation >> Jul 26, 2024  9:24 AM Erin Kirk ORN wrote: Reason for CRM: Patient is returning a call from the practice. Please call back the patient.

## 2024-07-26 NOTE — Telephone Encounter (Signed)
Patient calling in regards to results. Please advise.

## 2024-07-26 NOTE — Telephone Encounter (Signed)
 The pt has been advised that the pathology results have not been reviewed as of today. We will call her as soon as able.

## 2024-08-01 NOTE — Telephone Encounter (Signed)
 Dr Avram see message regarding path results

## 2024-08-01 NOTE — Telephone Encounter (Signed)
 Patient called stating she was very upset that she had not been called to discuss her results. I advised patient that Dr. Avram has  been doing procedures at the hospital I would send a message to the nurse. Patient is requesting a call to discuss further. Please advise.

## 2024-08-02 ENCOUNTER — Ambulatory Visit (INDEPENDENT_AMBULATORY_CARE_PROVIDER_SITE_OTHER)

## 2024-08-02 DIAGNOSIS — Z7901 Long term (current) use of anticoagulants: Secondary | ICD-10-CM | POA: Diagnosis not present

## 2024-08-02 LAB — POCT INR: INR: 2.6 (ref 2.0–3.0)

## 2024-08-02 NOTE — Progress Notes (Signed)
 Indication: Afib, DVT, PE, Lupus anticoagulant with hypercoagulable state Continue 1/2 tablet daily except take 1 tablet on Tuesday, Thursday, Saturday. Recheck in 5 weeks.

## 2024-08-02 NOTE — Patient Instructions (Addendum)
 Pre visit review using our clinic review tool, if applicable. No additional management support is needed unless otherwise documented below in the visit note.  Continue 1/2 tablet daily except take 1 tablet on Tuesday, Thursday, Saturday. Recheck in 5 weeks.

## 2024-08-02 NOTE — Telephone Encounter (Signed)
I called and left a message that I would call back 

## 2024-08-02 NOTE — Progress Notes (Signed)
 I have reviewed and agree with note, evaluation, plan.   Garnette Lukes, MD

## 2024-08-02 NOTE — Addendum Note (Signed)
 Addended by: ETHYL KIRSCH A on: 08/02/2024 10:57 AM   Modules accepted: Orders

## 2024-08-03 ENCOUNTER — Ambulatory Visit: Payer: Self-pay | Admitting: Internal Medicine

## 2024-08-03 NOTE — Telephone Encounter (Signed)
 See other message under results.  Plan for repeat EGD in 2 years office visit in 1 year.  I called her and reviewed things and documented in another result message.

## 2024-08-10 ENCOUNTER — Other Ambulatory Visit: Payer: Self-pay | Admitting: Family Medicine

## 2024-08-21 ENCOUNTER — Inpatient Hospital Stay: Attending: Oncology

## 2024-08-21 ENCOUNTER — Other Ambulatory Visit: Payer: Self-pay | Admitting: Family Medicine

## 2024-08-21 DIAGNOSIS — C7A092 Malignant carcinoid tumor of the stomach: Secondary | ICD-10-CM | POA: Insufficient documentation

## 2024-08-22 LAB — CHROMOGRANIN A: Chromogranin A (ng/mL): 981.5 ng/mL — ABNORMAL HIGH (ref 0.0–101.8)

## 2024-08-28 ENCOUNTER — Ambulatory Visit: Admitting: Oncology

## 2024-08-29 ENCOUNTER — Ambulatory Visit: Admitting: Oncology

## 2024-08-30 ENCOUNTER — Other Ambulatory Visit: Payer: Self-pay | Admitting: Family Medicine

## 2024-08-30 ENCOUNTER — Other Ambulatory Visit: Payer: Self-pay | Admitting: Internal Medicine

## 2024-09-06 ENCOUNTER — Ambulatory Visit

## 2024-09-06 DIAGNOSIS — Z7901 Long term (current) use of anticoagulants: Secondary | ICD-10-CM | POA: Diagnosis not present

## 2024-09-06 LAB — POCT INR: INR: 2.7 (ref 2.0–3.0)

## 2024-09-06 NOTE — Progress Notes (Signed)
 Indication: Afib, DVT, PE, Lupus anticoagulant with hypercoagulable state Continue 1/2 tablet daily except take 1 tablet on Tuesday, Thursday, Saturday. Recheck in 6 weeks.

## 2024-09-06 NOTE — Patient Instructions (Addendum)
 Pre visit review using our clinic review tool, if applicable. No additional management support is needed unless otherwise documented below in the visit note.  Continue 1/2 tablet daily except take 1 tablet on Tuesday, Thursday, Saturday. Recheck in 6 weeks.

## 2024-09-06 NOTE — Progress Notes (Signed)
 I have reviewed and agree with note, evaluation, plan.   Garnette Lukes, MD

## 2024-09-25 NOTE — Progress Notes (Signed)
 ANDERSON COPPOCK                                          MRN: [id number]   09/25/2024   The [facility] Quality Team Specialist reviewed this patient medical record for the purposes of chart review for care gap closure. The following were reviewed: abstraction for care gap closure-glycemic status assessment.    [facility] Quality Team

## 2024-09-29 ENCOUNTER — Encounter: Payer: Self-pay | Admitting: Family Medicine

## 2024-09-29 ENCOUNTER — Ambulatory Visit: Admitting: Family Medicine

## 2024-09-29 ENCOUNTER — Ambulatory Visit: Payer: Self-pay | Admitting: Family Medicine

## 2024-09-29 VITALS — BP 112/68 | HR 65 | Temp 97.7°F | Ht 64.0 in | Wt 194.4 lb

## 2024-09-29 DIAGNOSIS — N183 Chronic kidney disease, stage 3 unspecified: Secondary | ICD-10-CM | POA: Diagnosis not present

## 2024-09-29 DIAGNOSIS — L989 Disorder of the skin and subcutaneous tissue, unspecified: Secondary | ICD-10-CM | POA: Diagnosis not present

## 2024-09-29 DIAGNOSIS — E785 Hyperlipidemia, unspecified: Secondary | ICD-10-CM | POA: Diagnosis not present

## 2024-09-29 DIAGNOSIS — C7A8 Other malignant neuroendocrine tumors: Secondary | ICD-10-CM

## 2024-09-29 DIAGNOSIS — E669 Obesity, unspecified: Secondary | ICD-10-CM | POA: Diagnosis not present

## 2024-09-29 DIAGNOSIS — E1122 Type 2 diabetes mellitus with diabetic chronic kidney disease: Secondary | ICD-10-CM | POA: Diagnosis not present

## 2024-09-29 DIAGNOSIS — E039 Hypothyroidism, unspecified: Secondary | ICD-10-CM

## 2024-09-29 DIAGNOSIS — R7989 Other specified abnormal findings of blood chemistry: Secondary | ICD-10-CM | POA: Diagnosis not present

## 2024-09-29 DIAGNOSIS — Z6833 Body mass index (BMI) 33.0-33.9, adult: Secondary | ICD-10-CM

## 2024-09-29 DIAGNOSIS — I480 Paroxysmal atrial fibrillation: Secondary | ICD-10-CM | POA: Diagnosis not present

## 2024-09-29 DIAGNOSIS — E1169 Type 2 diabetes mellitus with other specified complication: Secondary | ICD-10-CM | POA: Diagnosis not present

## 2024-09-29 LAB — CBC WITH DIFFERENTIAL/PLATELET
Basophils Absolute: 0.1 K/uL (ref 0.0–0.1)
Basophils Relative: 0.8 % (ref 0.0–3.0)
Eosinophils Absolute: 0.3 K/uL (ref 0.0–0.7)
Eosinophils Relative: 3.9 % (ref 0.0–5.0)
HCT: 43.7 % (ref 36.0–46.0)
Hemoglobin: 14.6 g/dL (ref 12.0–15.0)
Lymphocytes Relative: 23.3 % (ref 12.0–46.0)
Lymphs Abs: 1.6 K/uL (ref 0.7–4.0)
MCHC: 33.5 g/dL (ref 30.0–36.0)
MCV: 90.2 fl (ref 78.0–100.0)
Monocytes Absolute: 0.4 K/uL (ref 0.1–1.0)
Monocytes Relative: 5.8 % (ref 3.0–12.0)
Neutro Abs: 4.5 K/uL (ref 1.4–7.7)
Neutrophils Relative %: 66.2 % (ref 43.0–77.0)
Platelets: 211 K/uL (ref 150.0–400.0)
RBC: 4.84 Mil/uL (ref 3.87–5.11)
RDW: 13.1 % (ref 11.5–15.5)
WBC: 6.8 K/uL (ref 4.0–10.5)

## 2024-09-29 LAB — COMPREHENSIVE METABOLIC PANEL WITH GFR
ALT: 17 U/L (ref 3–35)
AST: 15 U/L (ref 5–37)
Albumin: 4.6 g/dL (ref 3.5–5.2)
Alkaline Phosphatase: 61 U/L (ref 39–117)
BUN: 17 mg/dL (ref 6–23)
CO2: 30 meq/L (ref 19–32)
Calcium: 9.7 mg/dL (ref 8.4–10.5)
Chloride: 100 meq/L (ref 96–112)
Creatinine, Ser: 1.32 mg/dL — ABNORMAL HIGH (ref 0.40–1.20)
GFR: 40.88 mL/min — ABNORMAL LOW
Glucose, Bld: 99 mg/dL (ref 70–99)
Potassium: 3.9 meq/L (ref 3.5–5.1)
Sodium: 138 meq/L (ref 135–145)
Total Bilirubin: 0.9 mg/dL (ref 0.2–1.2)
Total Protein: 7.4 g/dL (ref 6.0–8.3)

## 2024-09-29 LAB — HEMOGLOBIN A1C: Hgb A1c MFr Bld: 6.3 % (ref 4.6–6.5)

## 2024-09-29 LAB — VITAMIN B12: Vitamin B-12: >1500 pg/mL — ABNORMAL HIGH (ref 211–911)

## 2024-09-29 LAB — TSH: TSH: 1.48 u[IU]/mL (ref 0.35–5.50)

## 2024-09-29 NOTE — Patient Instructions (Addendum)
 We will call you within two weeks about your referral for ultrasound of neck through [facility].  Their phone number is [phone].  Please call them if you have not heard in 1-2 weeks. If worsening symptom(s) let us  know  Please stop by lab before you go If you have mychart- we will send your results within 3 business days of us  receiving them.  If you do not have mychart- we will call you about results within 5 business days of us  receiving them.  *please also note that you will see labs on mychart as soon as they post. I will later go in and write notes on them- will say notes from Dr. Katrinka   Recommended follow up: Return in about 6 months (around 03/29/2025) for physical or sooner if needed.Schedule b4 you leave.

## 2024-09-29 NOTE — Progress Notes (Signed)
 " Phone [phone] In person visit   Subjective:   Erin Kirk is a 69 y.o. 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) 05/12/2019    Priority: High   Low vitamin B12 level 09/07/2016    Priority: High   Disability examination 01/17/2015    Priority: High   Type 2 diabetes mellitus with obesity 11/28/2010    Priority: High   PAROXYSMAL ATRIAL FIBRILLATION 07/02/2010    Priority: High   Primary hypercoagulable state 11/30/2007    Priority: High   Osteopenia 02/26/2020    Priority: Medium    Vitamin D  deficiency 07/20/2019    Priority: Medium    Iron  deficiency anemia 03/30/2019    Priority: Medium    Hyperlipidemia associated with type 2 diabetes mellitus (HCC) 03/09/2019    Priority: Medium    Cough variant asthma 10/14/2018    Priority: Medium    Neuropathy due to medical condition 11/09/2017    Priority: Medium    Hx of adenomatous polyp of colon 04/10/2016    Priority: Medium    CKD (chronic kidney disease), stage III (HCC) 09/05/2014    Priority: Medium    Hypothyroidism 03/09/2007    Priority: Medium    Essential hypertension 03/09/2007    Priority: Medium    History of Melanoma 03/09/2007    Priority: Medium    Abnormal findings on esophagogastroduodenoscopy (EGD) 07/02/2019    Priority: Low   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    Priority: Low   Multiple gastric polyps 07/02/2019    Priority: Low   Long term  (current) use of anticoagulants - warfarin 06/02/2017    Priority: Low   Encounter for therapeutic drug monitoring 10/26/2013    Priority: Low   Left facial pain 09/24/2012    Priority: Low   Oral mucosal lesion 09/24/2012    Priority: Low   TRANSIENT DISORDER INITIATING/MAINTAINING SLEEP 04/02/2010    Priority: Low   Overweight(278.02) 11/29/2009    Priority: Low   LOW BACK PAIN 06/15/2008    Priority: Low   ALLERGIC RHINITIS 03/09/2007    Priority: Low   GERD 03/09/2007    Priority: Low   Personal history of colon polyps, unspecified 07/13/2024   Gastric polyp 06/15/2023   Chronic gastritis without bleeding 06/15/2023   Pancreatic lesion on EUS and NETSPOT  scan ? neuroendocrine tumor 04/21/2020   Abnormal finding on radiology exam 04/21/2020   Intestinal metaplasia of gastric mucosa 04/21/2020   Acquired thrombophilia 02/14/2020   Myalgia due to statin 02/14/2020   Genetic testing 08/15/2019   Family history of breast cancer    Family history of ovarian cancer    Family history of uterine cancer    Family history of lung cancer    Family history of bone cancer    Family history of testicular cancer     Medications- reviewed and updated Current Outpatient Medications  Medication  Sig Dispense Refill   acetaminophen  (TYLENOL ) 500 MG tablet Take 500 mg by mouth every 6 (six) hours as needed (for pain.).      Alum Hydroxide-Mag Trisilicate (GAVISCON) 80-14.2 MG CHEW Chew 1-2 tablets by mouth 2 (two) times daily as needed (indigestion).     Cholecalciferol (VITAMIN D3) 50 MCG (2000 UT) TABS Take 2,000 Units by mouth daily.     diltiazem  (CARDIZEM  CD) 360 MG 24 hr capsule Take 1 capsule by mouth at bedtime 90 capsule 0   docusate sodium (COLACE) 100 MG capsule Take 100 mg by mouth at bedtime. Stool softner     ferrous sulfate  325 (65 FE) MG tablet Take 1 tablet (325 mg total) by mouth daily with breakfast.     levothyroxine  (SYNTHROID ) 100 MCG tablet Take 1 tablet by mouth once  daily 90 tablet 0   loratadine (CLARITIN) 10 MG tablet Take 10 mg by mouth daily as needed for allergies.     metoprolol  succinate (TOPROL -XL) 25 MG 24 hr tablet Take 1 tablet by mouth once daily 90 tablet 0   omeprazole  (PRILOSEC) 40 MG capsule Take 1 capsule by mouth once daily 90 capsule 2   telmisartan  (MICARDIS ) 40 MG tablet Take 1 tablet by mouth once daily 90 tablet 0   triamterene -hydrochlorothiazide  (MAXZIDE -25) 37.5-25 MG tablet Take 1 tablet by mouth once daily 90 tablet 0   vitamin B-12 (CYANOCOBALAMIN ) 1000 MCG tablet Take 1,000 mcg by mouth daily.     warfarin (COUMADIN ) 5 MG tablet TAKE 1 TABLET BY MOUTH ONCE DAILY EXCEPT  TAKE  1/2  TABLET  ON  MONDAYS  AND  THURSDAYS  OR  AS  DIRECTED  BY  ANTICOAGULATION  CLINIC 105 tablet 0   Current Facility-Administered Medications  Medication Dose Route Frequency Provider Last Rate Last Admin   phenazopyridine  (PYRIDIUM ) tablet 100 mg  100 mg Oral TID WC Jesus Bernardino MATSU, MD         Objective:  BP 112/68 (BP Location: Left Arm, Patient Position: Sitting, Cuff Size: Normal)   Pulse 65   Temp 97.7 F (36.5 C) (Temporal)   Ht 5' 4 (1.626 m)   Wt 194 lb 6.4 oz (88.2 kg)   LMP  (LMP Unknown)   SpO2 96%   BMI 33.37 kg/m  Gen: NAD, resting comfortably CV: RRR no murmurs rubs or gallops Lungs: CTAB no crackles, wheeze, rhonchi Ext: no edema Skin: warm, dry     Assessment and Plan   #Social update-Sister with lung cancer-reports she is doing okay but did end up with pneumonia - last pet scan was ok   # Neck lesion S: Patient has noted a small knot in her neck back for last month. No pain in this- sometimes feels and sometimes not -occasionally with certain foods might feel like getting stuck like raw vegetables A/P: small growth- but doesn't always feel- on my exam wonder if this is a cyst- will get ultrasound of neck to evaluate further . No issues noted on EGD to attribute to this from October.   - also is going of the metnion  to Dr. 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 Wilhelmenia . follow chromogranin levels and PET scans. EGD yearly now- last October 2025 as long as chromogranin stable-only mild shift upward recently- will continue to monitor with oncology  # primary hypercoagulable state  with history of multiple DVT and PE #acquired thrombophilia on coumadin  S: patient remains on  Coumadin  long term -requires lovenox  bridge for procedures.  A/P: overall stable on coumadin - continue current medications    # Atrial fibrillation S: Rate controlled with  diltiazem  360mg  ER and metoprolol  25 mg ER Anticoagulated with  warfarin. Also requires lovenox  bridge for procedures A/P: appropriately anticoagulated and rate controlled- continue current medicine    # Iron  deficiency anemia- iron  levels have been low- currently taking daily. Also on high dose PPI through GI.  Labs look excellent at last visit. Hold off since stable  #hypertension S: medication:  diltiazem  360mg  XR, metoprolol  25 mg XR, telmisartan  40mg , triamterene  hctz 37.5-25 mg A/P: well controlled continue current medications   #hyperlipidemia- she prefers to stay on lowest dose possible S: Medication: lovastatin  10 mg once a week but had muscle aches - off this Lab Results  Component Value Date   CHOL 156 03/30/2024   HDL 58.70 03/30/2024   LDLCALC 74 03/30/2024   LDLDIRECT 39.0 03/10/2019   TRIG 119.0 03/30/2024   CHOLHDL 3 03/30/2024   A/P: cholesterol mildly high but statin intolerant - she's still biking and has increased resistance  # Diabetes S: Medication: diet controlled Lab Results  Component Value Date   HGBA1C 6.5 03/30/2024   HGBA1C 6.5 09/30/2023   HGBA1C 6.3 03/19/2023    A/P: hopefully stable- update a1c today. Continue without meds for now   #hypothyroidism S: compliant On  thyroid  medication- levothyroxine  100 mcg  Lab Results  Component Value Date   TSH 1.66 09/30/2023  A/P:hopefully stable- update tsh today. Continue current meds for now    # B12 deficiency S: history of neuropathy with this. Metformin  likely contributed. Doing well now on oral medication after time on injectoins.   Lab Results  Component Value Date   VITAMINB12 >1500 (H) 03/30/2024  A/P: hopefully stable- update b12 today. Continue current meds for now    #osteopenia- recheck 2025 osteopenia but risk was 3.1% for hips and had worsened, last 2021 prior. Prefers calcium and vitamin D  and recheck. Weight bearing exercise. May add some band exercises - holding off on osteoporosis clinic in 2025  #CKD III- last GFR at 40 - monitor - mild worsening over time . May need more workup if worsening further  Recommended follow up: Return in about 6 months (around 03/29/2025) for physical or sooner if needed.Schedule b4 you leave. Future Appointments  Date Time Provider Department Center  10/10/2024 12:00 PM Cloretta Arley NOVAK, MD [facility] None  10/18/2024 10:00 AM [facility] COUMADIN  CLINIC [facility]  03/05/2025 10:00 AM [facility] ANNUAL WELLNESS VISIT 1 [facility]    Lab/Order associations:   ICD-10-CM   1. Lesion of neck  L98.9 US  Soft Tissue Head/Neck (NON-THYROID )    2. Type 2 diabetes mellitus in patient with obesity (HCC)  E11.9 CBC with Differential/Platelet   E66.9 Comprehensive metabolic panel with GFR    Hemoglobin A1c    3. Low vitamin B12 level  R79.89 Vitamin B12    4. Hypothyroidism, unspecified type  E03.9 TSH    5. 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 Paroxysmal atrial fibrillation (HCC)  I48.0     7. Hyperlipidemia associated with type 2 diabetes mellitus (HCC)  E11.69    E78.5     8. Stage 3 chronic kidney disease, unspecified whether stage 3a or 3b CKD (HCC)  N18.30  No orders of the defined types were  placed in this encounter.   Return precautions advised.  Garnette Lukes, MD  "

## 2024-10-05 ENCOUNTER — Inpatient Hospital Stay: Admission: RE | Admit: 2024-10-05 | Discharge: 2024-10-05 | Attending: Family Medicine | Admitting: Family Medicine

## 2024-10-05 DIAGNOSIS — L989 Disorder of the skin and subcutaneous tissue, unspecified: Secondary | ICD-10-CM

## 2024-10-09 ENCOUNTER — Other Ambulatory Visit: Payer: Self-pay | Admitting: Family Medicine

## 2024-10-10 ENCOUNTER — Inpatient Hospital Stay: Attending: Oncology | Admitting: Oncology

## 2024-10-10 VITALS — BP 108/71 | HR 93 | Temp 98.2°F | Resp 18 | Ht 64.0 in | Wt 198.3 lb

## 2024-10-10 DIAGNOSIS — C7A092 Malignant carcinoid tumor of the stomach: Secondary | ICD-10-CM

## 2024-10-10 NOTE — Progress Notes (Signed)
 " [facility] OFFICE PROGRESS NOTE   Diagnosis: Gastric carcinoid tumor  INTERVAL HISTORY:   [name] returns as scheduled.  She reports intermittent left abdominal discomfort.  She has frequent bowel movements.  The bowel movements are frequently loose.  She reports the consistency depends on what she eats.  She sometimes has flushing spells at night. She underwent an upper endoscopy and colonoscopy in October 2025.  A single 12 mm polyp was removed from the greater curvature of the stomach.  Diminutive sessile polyps were found in the cardia, fundus, and gastric body.  Diffuse inflammation was found in the stomach  She had a thyroid  ultrasound after she palpated a fullness in the lower anterior neck.  The ultrasound revealed a subcentimeter right thyroid  nodule. Objective:  Vital signs in last 24 hours:  Blood pressure 108/71, pulse 93, temperature 98.2 F (36.8 C), resp. rate 18, height 5' 4 (1.626 m), weight 198 lb 4.8 oz (89.9 kg), SpO2 100%.    HEENT: Neck without mass Lymphatics: No cervical, supraclavicular, axillary, or inguinal nodes Resp: Lungs clear bilaterally Cardio: Regular rate and rhythm GI: No hepatosplenomegaly, no mass, tender with deep palpation in the lateral mid left abdomen Vascular: No leg edema  Lab Results:  Lab Results  Component Value Date   WBC 6.8 09/29/2024   HGB 14.6 09/29/2024   HCT 43.7 09/29/2024   MCV 90.2 09/29/2024   PLT 211.0 09/29/2024   NEUTROABS 4.5 09/29/2024    CMP  Lab Results  Component Value Date   NA 138 09/29/2024   K 3.9 09/29/2024   CL 100 09/29/2024   CO2 30 09/29/2024   GLUCOSE 99 09/29/2024   BUN 17 09/29/2024   CREATININE 1.32 (H) 09/29/2024   CALCIUM 9.7 09/29/2024   PROT 7.4 09/29/2024   ALBUMIN  4.6 09/29/2024   AST 15 09/29/2024   ALT 17 09/29/2024   ALKPHOS 61 09/29/2024   BILITOT 0.9 09/29/2024   GFRNONAA 49 (L) 08/19/2020   GFRAA 57 (L) 08/19/2020     Medications: I have reviewed  the patient's current medications.   Assessment/Plan: Multiple gastric carcinoid tumors Upper endoscopy 05/05/2019 with resection of multiple gastric carcinoid tumors EUS 07/12/2019-resection of multiple gastric carcinoid tumors, positive resection margin at the gastric cardia polypectomy site, tubular adenomas and hyperplastic polyps removed from the lesser curvature Elevated chromogranin A level CT abdomen/pelvis 05/18/2019-no evidence of a gastric mass or metastatic disease Netspot  08/11/2019-focal activity corresponding to a 2-3 mm lesion in the mid pancreas, diffuse activity in the stomach EGD 10/09/2019- 3 areas of scar/clip/polypoid tissue removed with negative pathology EUS 10/09/2019-4 mm pancreas body lesion, FNA biopsy negative, no adenopathy EUS 06/19/2020-no evidence of recurrent tumor in areas of gastric body scars, nodularity was noted underneath 2 endoclips in the greater curvature, this area was biopsied and polyps were removed from the middle gastric body and proximal gastric antrum, biopsies from erosions and erythema were taken at the greater curvature, lesser curvature, and antrum.  An irregular lesion was identified in the pancreas body measuring 3.4 x 4.6 mm, the pancreas lesion was biopsied.  The pathology from the pancreas FNA was negative, the pathology from multiple biopsies including the gastric body scar, antrum polyp, greater curvature biopsy, lesser curvature biopsy, and gastric cardia biopsy revealed low-grade neuroendocrine tumor Netspot  04/14/2021-no residual avid tumor in the stomach, no evidence of tracer avid metastatic disease, decrease in tracer activity associated with the pancreas lesion Upper endoscopy 06/12/2021-5 gastric polyps resected and retrieved-hyperplastic polyp with  well differentiated neuroendocrine tumor, stomach cardia and fundus biopsy-reactive gastropathy with neuroendocrine dysplasia, lesser curvature biopsy-reactive gastropathy with neuroendocrine  hyperplasia Upper endoscopy 05/28/2022-multiple polyps removed from the stomach and areas of chronic gastritis were biopsied-hyperplastic polyps, reactive gastropathy, mild chronic gastritis, negative for H. pylori and carcinoma Netspot  12/03/2022-focal radiotracer activity at the gastric antrum similar to 04/14/2021, no evidence of metastatic disease, tiny focus of activity in the mid body the pancreas-unchanged Upper endoscopy 06/15/2023-gastric polyps, gastritis-gastric hyperplastic polyp with focal intestinal metaplasia, negative for dysplasia, stomach biopsies with gastritis and atrophic autoimmune gastritis Upper endoscopy 07/13/2024 polyp removed from the greater curvature-hyperplastic polyp, few diminutive gastric polyps, gastritis-chronic inactive gastritis, H. 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   Hypothyroidism 7.  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  Family history of uterine and breast cancer 9.  Gastric and colonic adenomas Colonoscopy 06/12/2021-polyps removed from the cecum, ascending colon, and descending colon-tubular adenomas and hyperplastic polyp Colonoscopy 07/13/2024-normal 10.  Hypodense pancreas lesion 05/18/2019 -positive Netspot  uptake 08/11/2019, negative FNA biopsy 11.  Atrial fibrillation      Disposition: [name] appears stable.  The surveillance endoscopy in October revealed no evidence of progressive gastric carcinoid tumor.  The chromogranin a level was stable last month.  She last had a staging dotatate PET in March 2024.  She will be scheduled for a restaging dotatate scan in April of this year.  She will return for an office visit and chromogranin A level in approximately 8 months.  She plans to continue yearly endoscopy with Dr.  Avram.  Arley Hof, MD  10/10/2024  12:34 PM   "

## 2024-10-17 ENCOUNTER — Telehealth: Payer: Self-pay | Admitting: Family Medicine

## 2024-10-17 NOTE — Telephone Encounter (Signed)
 Spoke with patient and she is aware of provider remarks and recommendations.   Copied from CRM #[id number]. Topic: Clinical - Lab/Test Results >> Oct 17, 2024  8:22 AM Erin Kirk wrote: Reason for CRM: patient would like for someone to give a call regarding lab results

## 2024-10-18 ENCOUNTER — Ambulatory Visit

## 2024-10-19 ENCOUNTER — Encounter: Payer: Self-pay | Admitting: *Deleted

## 2024-10-19 NOTE — Progress Notes (Signed)
 JOURDIN GENS                                          MRN: [id number]   10/19/2024   The [facility] Quality Team Specialist reviewed this patient medical record for the purposes of chart review for care gap closure. The following were reviewed: chart review for care gap closure-glycemic status assessment.    [facility] Quality Team

## 2024-10-25 ENCOUNTER — Ambulatory Visit

## 2024-10-25 DIAGNOSIS — Z7901 Long term (current) use of anticoagulants: Secondary | ICD-10-CM

## 2024-10-25 LAB — POCT INR: INR: 3.1 — AB (ref 2.0–3.0)

## 2024-10-25 MED ORDER — WARFARIN SODIUM 5 MG PO TABS: ORAL_TABLET | ORAL | 1 refills | Status: AC

## 2024-10-25 NOTE — Progress Notes (Signed)
 Indication: Afib, DVT, PE, Lupus anticoagulant with hypercoagulable state Continue 1/2 tablet daily except take 1 tablet on Tuesday, Thursday, Saturday. Recheck in 6 weeks.  Pt is compliant with warfarin management and PCP apts.  Sent in refill of warfarin to requested pharmacy.

## 2024-10-25 NOTE — Patient Instructions (Addendum)
 Pre visit review using our clinic review tool, if applicable. No additional management support is needed unless otherwise documented below in the visit note.  Continue 1/2 tablet daily except take 1 tablet on Tuesday, Thursday, Saturday. Recheck in 6 weeks.

## 2024-10-25 NOTE — Progress Notes (Signed)
 I have reviewed and agree with note, evaluation, plan.   Garnette Lukes, MD

## 2024-12-06 ENCOUNTER — Ambulatory Visit

## 2025-01-05 ENCOUNTER — Encounter (HOSPITAL_COMMUNITY)

## 2025-03-05 ENCOUNTER — Ambulatory Visit

## 2025-04-03 ENCOUNTER — Encounter: Admitting: Family Medicine

## 2025-04-18 ENCOUNTER — Encounter: Admitting: Family Medicine

## 2025-06-19 ENCOUNTER — Inpatient Hospital Stay

## 2025-06-26 ENCOUNTER — Inpatient Hospital Stay: Admitting: Oncology

## 2025-06-26 ENCOUNTER — Inpatient Hospital Stay
